# Patient Record
Sex: Male | Born: 1945
Health system: Southern US, Community
[De-identification: ages and names within clinical notes are randomized; demographics above are authoritative.]

## PROBLEM LIST (undated history)

## (undated) DIAGNOSIS — S72009A Fracture of unspecified part of neck of unspecified femur, initial encounter for closed fracture: Secondary | ICD-10-CM

## (undated) DIAGNOSIS — I700 Atherosclerosis of aorta: Secondary | ICD-10-CM

## (undated) DIAGNOSIS — M1990 Unspecified osteoarthritis, unspecified site: Secondary | ICD-10-CM

## (undated) DIAGNOSIS — H269 Unspecified cataract: Secondary | ICD-10-CM

## (undated) DIAGNOSIS — I10 Essential (primary) hypertension: Secondary | ICD-10-CM

## (undated) DIAGNOSIS — R112 Nausea with vomiting, unspecified: Secondary | ICD-10-CM

## (undated) DIAGNOSIS — R7303 Prediabetes: Secondary | ICD-10-CM

## (undated) DIAGNOSIS — Z9889 Other specified postprocedural states: Principal | ICD-10-CM

## (undated) DIAGNOSIS — E785 Hyperlipidemia, unspecified: Secondary | ICD-10-CM

## (undated) DIAGNOSIS — M8580 Other specified disorders of bone density and structure, unspecified site: Secondary | ICD-10-CM

## (undated) DIAGNOSIS — Z8781 Personal history of (healed) traumatic fracture: Secondary | ICD-10-CM

## (undated) HISTORY — DX: Unspecified cataract: H26.9

## (undated) HISTORY — DX: Hyperlipidemia, unspecified: E78.5

## (undated) HISTORY — DX: Other specified postprocedural states: Z98.890

## (undated) HISTORY — PX: HERNIA REPAIR: SHX51

## (undated) HISTORY — PX: CHOLECYSTECTOMY: SHX55

## (undated) HISTORY — DX: Essential (primary) hypertension: I10

## (undated) HISTORY — DX: Nausea with vomiting, unspecified: R11.2

## (undated) HISTORY — PX: SMALL INTESTINE SURGERY: SHX150

## (undated) HISTORY — DX: Fracture of unspecified part of neck of unspecified femur, initial encounter for closed fracture: S72.009A

## (undated) HISTORY — DX: Other specified disorders of bone density and structure, unspecified site: M85.80

## (undated) HISTORY — PX: COLON SURGERY: SHX602

---
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
## 1971-10-28 HISTORY — PX: CYST EXCISION: SHX5701

## 2005-10-27 DIAGNOSIS — S72009A Fracture of unspecified part of neck of unspecified femur, initial encounter for closed fracture: Secondary | ICD-10-CM

## 2005-10-27 HISTORY — PX: FRACTURE SURGERY: SHX138

## 2005-10-27 HISTORY — DX: Fracture of unspecified part of neck of unspecified femur, initial encounter for closed fracture: S72.009A

## 2006-03-10 ENCOUNTER — Inpatient Hospital Stay (HOSPITAL_COMMUNITY): Admission: EM | Admit: 2006-03-10 | Discharge: 2006-03-12 | Payer: Self-pay | Admitting: Emergency Medicine

## 2008-07-06 ENCOUNTER — Ambulatory Visit: Payer: Self-pay | Admitting: Gastroenterology

## 2011-10-09 ENCOUNTER — Ambulatory Visit: Payer: Self-pay | Admitting: Otolaryngology

## 2012-03-18 ENCOUNTER — Ambulatory Visit: Payer: Self-pay | Admitting: Family Medicine

## 2015-03-20 DIAGNOSIS — H18611 Keratoconus, stable, right eye: Secondary | ICD-10-CM | POA: Diagnosis not present

## 2015-03-20 DIAGNOSIS — H524 Presbyopia: Secondary | ICD-10-CM | POA: Diagnosis not present

## 2015-03-20 DIAGNOSIS — D3131 Benign neoplasm of right choroid: Secondary | ICD-10-CM | POA: Diagnosis not present

## 2015-04-24 ENCOUNTER — Telehealth: Payer: Self-pay | Admitting: Family Medicine

## 2015-04-24 NOTE — Telephone Encounter (Signed)
Ms Drew Mcmahon called in to schedule an appointment to refill meds that run out this Friday but the next available appointment is July 20th and they would like a call back letting her know if meds can be given to last until the appointment on the 20th.

## 2015-04-25 NOTE — Telephone Encounter (Signed)
Drew Mcmahon called in to schedule an appointment to refill meds that run out this Friday but the next available appointment is July 20th and they would like a call back letting her know if meds can be given to last until the appointment on the 20th.

## 2015-04-26 MED ORDER — LOSARTAN POTASSIUM-HCTZ 100-25 MG PO TABS: 1.0000 | ORAL_TABLET | Freq: Every day | ORAL | Status: DC

## 2015-04-26 NOTE — Telephone Encounter (Signed)
rx sent pt making apt PE

## 2015-04-26 NOTE — Telephone Encounter (Signed)
I SPOKE WITH MS GAYLE AND THE MEDICATION NEEDED IS LISARTIN(NOT SURE HOW TO SPELL IT) THEY WOULD LIKE IT TO GO TO [address] [place]

## 2015-05-01 ENCOUNTER — Other Ambulatory Visit: Payer: Self-pay | Admitting: Family Medicine

## 2015-05-01 MED ORDER — AMLODIPINE BESYLATE 10 MG PO TABS: 10.0000 mg | ORAL_TABLET | Freq: Every day | ORAL | Status: DC

## 2015-05-01 NOTE — Telephone Encounter (Signed)
Pt's wife called stated one if the medications the pt needs got sent to the pharmacy but the other did not. Pt needs Amlodopine to last until his appt on 05/16/15 with Dr. Jeananne Rama. 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 Pt is completely out of medication as of Saturday. Please call pt if this can or can not be done @ [phone] Edd Fabian). Thanks.

## 2015-05-01 NOTE — Telephone Encounter (Signed)
Ordered medication please review and sign.  [place]

## 2015-05-15 DIAGNOSIS — I10 Essential (primary) hypertension: Secondary | ICD-10-CM | POA: Insufficient documentation

## 2015-05-15 DIAGNOSIS — E785 Hyperlipidemia, unspecified: Secondary | ICD-10-CM | POA: Insufficient documentation

## 2015-05-16 ENCOUNTER — Other Ambulatory Visit: Payer: Self-pay | Admitting: Family Medicine

## 2015-05-16 ENCOUNTER — Ambulatory Visit: Payer: Medicare Other | Admitting: Family Medicine

## 2015-05-16 MED ORDER — AMLODIPINE BESYLATE 10 MG PO TABS: 10.0000 mg | ORAL_TABLET | Freq: Every day | ORAL | Status: DC

## 2015-06-28 ENCOUNTER — Ambulatory Visit: Payer: Medicare Other | Admitting: Family Medicine

## 2015-07-13 DIAGNOSIS — L280 Lichen simplex chronicus: Secondary | ICD-10-CM | POA: Diagnosis not present

## 2015-07-13 DIAGNOSIS — L239 Allergic contact dermatitis, unspecified cause: Secondary | ICD-10-CM | POA: Diagnosis not present

## 2015-07-13 DIAGNOSIS — L538 Other specified erythematous conditions: Secondary | ICD-10-CM | POA: Diagnosis not present

## 2015-07-13 DIAGNOSIS — D485 Neoplasm of uncertain behavior of skin: Secondary | ICD-10-CM | POA: Diagnosis not present

## 2015-07-13 DIAGNOSIS — D2111 Benign neoplasm of connective and other soft tissue of right upper limb, including shoulder: Secondary | ICD-10-CM | POA: Diagnosis not present

## 2015-08-27 ENCOUNTER — Encounter: Payer: Self-pay | Admitting: Family Medicine

## 2015-08-27 ENCOUNTER — Ambulatory Visit (INDEPENDENT_AMBULATORY_CARE_PROVIDER_SITE_OTHER): Payer: Medicare Other | Admitting: Family Medicine

## 2015-08-27 VITALS — BP 128/71 | HR 70 | Temp 98.1°F | Ht 72.0 in | Wt 227.0 lb

## 2015-08-27 DIAGNOSIS — G6289 Other specified polyneuropathies: Secondary | ICD-10-CM | POA: Diagnosis not present

## 2015-08-27 DIAGNOSIS — Z23 Encounter for immunization: Secondary | ICD-10-CM

## 2015-08-27 DIAGNOSIS — G629 Polyneuropathy, unspecified: Secondary | ICD-10-CM | POA: Insufficient documentation

## 2015-08-27 DIAGNOSIS — N400 Enlarged prostate without lower urinary tract symptoms: Secondary | ICD-10-CM | POA: Diagnosis not present

## 2015-08-27 DIAGNOSIS — Z0000 Encounter for general adult medical examination without abnormal findings: Secondary | ICD-10-CM | POA: Diagnosis not present

## 2015-08-27 DIAGNOSIS — I10 Essential (primary) hypertension: Secondary | ICD-10-CM

## 2015-08-27 LAB — URINALYSIS, ROUTINE W REFLEX MICROSCOPIC
BILIRUBIN UA: NEGATIVE
GLUCOSE, UA: NEGATIVE
KETONES UA: NEGATIVE
Nitrite, UA: NEGATIVE
PH UA: 6 (ref 5.0–7.5)
PROTEIN UA: NEGATIVE
RBC UA: NEGATIVE
SPEC GRAV UA: 1.02 (ref 1.005–1.030)
UUROB: 0.2 mg/dL (ref 0.2–1.0)

## 2015-08-27 LAB — MICROSCOPIC EXAMINATION: RBC, UA: NONE SEEN /hpf (ref 0–?)

## 2015-08-27 MED ORDER — LOSARTAN POTASSIUM-HCTZ 100-25 MG PO TABS: 1.0000 | ORAL_TABLET | Freq: Every day | ORAL | Status: DC

## 2015-08-27 MED ORDER — AMLODIPINE BESYLATE 10 MG PO TABS: 10.0000 mg | ORAL_TABLET | Freq: Every day | ORAL | Status: DC

## 2015-08-27 NOTE — Assessment & Plan Note (Signed)
The current medical regimen is effective;  continue present plan and medications.  

## 2015-08-27 NOTE — Assessment & Plan Note (Signed)
We'll begin workup with lab V57, folic acid and other labs. Lumbar spine x-ray

## 2015-08-27 NOTE — Progress Notes (Signed)
BP 128/71 mmHg  Pulse 70  Temp(Src) 98.1 F (36.7 C)  Ht 6' (1.829 m)  Wt 227 lb (102.967 kg)  BMI 30.78 kg/m2  SpO2 98%   Subjective:    Patient ID: Drew Mcmahon, male    DOB: Feb 22, 1946, 69 y.o.   MRN: [id number]  HPI: Drew Mcmahon is a 69 y.o. male  Chief Complaint  Patient presents with  . Annual Exam   patient also follow-up hypertension doing well with medications no side effects or complaints taking faithfully with good control.  Patient also with some nonspecific dry itching rash on legs has tried a little bit of triamcinolone Over the last year or so but getting more and more noticeable in his bilateral foot numbness especially with walking but also at rest and at bedtime. Which really just all 10 toes are numb. Maybe the right more so than the left.   Relevant past medical, surgical, family and social history reviewed and updated as indicated. Interim medical history since our last visit reviewed. Allergies and medications reviewed and updated.  Review of Systems  Constitutional: Negative.   HENT: Negative.   Eyes: Negative.   Respiratory: Negative.   Cardiovascular: Negative.   Gastrointestinal: Negative.        Some occasional dysphagia with bread or fish but nothing on a regular basis.  Endocrine: Negative.   Genitourinary: Negative.   Musculoskeletal: Negative.   Skin: Negative.   Allergic/Immunologic: Negative.   Neurological: Negative.   Hematological: Negative.   Psychiatric/Behavioral: Negative.     Per HPI unless specifically indicated above     Objective:    BP 128/71 mmHg  Pulse 70  Temp(Src) 98.1 F (36.7 C)  Ht 6' (1.829 m)  Wt 227 lb (102.967 kg)  BMI 30.78 kg/m2  SpO2 98%  Wt Readings from Last 3 Encounters:  08/27/15 227 lb (102.967 kg)  10/13/14 235 lb (106.595 kg)    Physical Exam  Constitutional: He is oriented to person, place, and time. He appears well-developed and well-nourished.  HENT:  Head: Normocephalic and  atraumatic.  Right Ear: External ear normal.  Left Ear: External ear normal.  Eyes: Conjunctivae and EOM are normal. Pupils are equal, round, and reactive to light.  Neck: Normal range of motion. Neck supple.  Cardiovascular: Normal rate, regular rhythm, normal heart sounds and intact distal pulses.   Pulmonary/Chest: Effort normal and breath sounds normal.  Abdominal: Soft. Bowel sounds are normal. There is no splenomegaly or hepatomegaly.  Genitourinary: Rectum normal and penis normal.  Prostate enlarged  Musculoskeletal: Normal range of motion.  Neurological: He is alert and oriented to person, place, and time. He has normal reflexes.  Normal sensation to monofilament line  Skin: No rash noted. No erythema.  Eczema changes both lower legs  Psychiatric: He has a normal mood and affect. His behavior is normal. Judgment and thought content normal.    No results found for this or any previous visit.    Assessment & Plan:   Problem List Items Addressed This Visit      Cardiovascular and Mediastinum   Hypertension    The current medical regimen is effective;  continue present plan and medications.       Relevant Medications   amLODipine (NORVASC) 10 MG tablet   losartan-hydrochlorothiazide (HYZAAR) 100-25 MG tablet   Other Relevant Orders   Comprehensive metabolic panel   Lipid panel   CBC with Differential/Platelet   TSH     Nervous and Auditory  Peripheral neuropathy (HCC)    We'll begin workup with lab V49, folic acid and other labs. Lumbar spine x-ray      Relevant Orders   Comprehensive metabolic panel   Lipid panel   CBC with Differential/Platelet   TSH   DG Lumbar Spine 2-3 Views     Genitourinary   BPH (benign prostatic hyperplasia)   Relevant Orders   Comprehensive metabolic panel   Lipid panel   CBC with Differential/Platelet   PSA   Urinalysis, Routine w reflex microscopic (not at [facility])   TSH    Other Visit Diagnoses    Immunization due    -   Primary    Relevant Orders    Flu Vaccine QUAD 36+ mos PF IM (Fluarix & Fluzone Quad PF) (Completed)    PE (physical exam), annual            Follow up plan: Return in about 6 months (around 02/24/2016), or if symptoms worsen or fail to improve, for pending feet, and BP check 6 mo with BMP.

## 2015-08-28 ENCOUNTER — Telehealth: Payer: Self-pay | Admitting: Family Medicine

## 2015-08-28 DIAGNOSIS — E7800 Pure hypercholesterolemia, unspecified: Secondary | ICD-10-CM

## 2015-08-28 DIAGNOSIS — R7309 Other abnormal glucose: Secondary | ICD-10-CM

## 2015-08-28 LAB — COMPREHENSIVE METABOLIC PANEL
A/G RATIO: 1.6 (ref 1.1–2.5)
ALBUMIN: 4.2 g/dL (ref 3.6–4.8)
ALK PHOS: 95 IU/L (ref 39–117)
ALT: 19 IU/L (ref 0–44)
AST: 17 IU/L (ref 0–40)
BILIRUBIN TOTAL: 0.7 mg/dL (ref 0.0–1.2)
BUN / CREAT RATIO: 18 (ref 10–22)
BUN: 17 mg/dL (ref 8–27)
CHLORIDE: 98 mmol/L (ref 97–106)
CO2: 25 mmol/L (ref 18–29)
Calcium: 9.4 mg/dL (ref 8.6–10.2)
Creatinine, Ser: 0.97 mg/dL (ref 0.76–1.27)
GFR calc non Af Amer: 80 mL/min/{1.73_m2} (ref >59)
GFR, EST AFRICAN AMERICAN: 92 mL/min/{1.73_m2} (ref >59)
Globulin, Total: 2.7 g/dL (ref 1.5–4.5)
Glucose: 127 mg/dL — ABNORMAL HIGH (ref 65–99)
POTASSIUM: 4.3 mmol/L (ref 3.5–5.2)
Sodium: 138 mmol/L (ref 136–144)
TOTAL PROTEIN: 6.9 g/dL (ref 6.0–8.5)

## 2015-08-28 LAB — CBC WITH DIFFERENTIAL/PLATELET
Basophils Absolute: 0 10*3/uL (ref 0.0–0.2)
Basos: 0 %
EOS (ABSOLUTE): 0.2 10*3/uL (ref 0.0–0.4)
EOS: 2 %
HEMATOCRIT: 45.9 % (ref 37.5–51.0)
HEMOGLOBIN: 16.2 g/dL (ref 12.6–17.7)
IMMATURE GRANS (ABS): 0 10*3/uL (ref 0.0–0.1)
IMMATURE GRANULOCYTES: 0 %
LYMPHS: 31 %
Lymphocytes Absolute: 2.5 10*3/uL (ref 0.7–3.1)
MCH: 31.6 pg (ref 26.6–33.0)
MCHC: 35.3 g/dL (ref 31.5–35.7)
MCV: 90 fL (ref 79–97)
MONOCYTES: 10 %
Monocytes Absolute: 0.8 10*3/uL (ref 0.1–0.9)
NEUTROS PCT: 57 %
Neutrophils Absolute: 4.7 10*3/uL (ref 1.4–7.0)
Platelets: 226 10*3/uL (ref 150–379)
RBC: 5.13 x10E6/uL (ref 4.14–5.80)
RDW: 12.6 % (ref 12.3–15.4)
WBC: 8.1 10*3/uL (ref 3.4–10.8)

## 2015-08-28 LAB — LIPID PANEL
CHOLESTEROL TOTAL: 216 mg/dL — AB (ref 100–199)
Chol/HDL Ratio: 3.7 ratio units (ref 0.0–5.0)
HDL: 58 mg/dL (ref >39)
LDL Calculated: 139 mg/dL — ABNORMAL HIGH (ref 0–99)
Triglycerides: 94 mg/dL (ref 0–149)
VLDL Cholesterol Cal: 19 mg/dL (ref 5–40)

## 2015-08-28 LAB — PSA: Prostate Specific Ag, Serum: 3.1 ng/mL (ref 0.0–4.0)

## 2015-08-28 LAB — TSH: TSH: 1.73 u[IU]/mL (ref 0.450–4.500)

## 2015-08-28 NOTE — Telephone Encounter (Signed)
-----   Message from Wynn Maudlin, [place] sent at 08/28/2015  4:34 PM EDT ----- labs

## 2015-08-28 NOTE — Telephone Encounter (Signed)
Phone call Discussed with patient elevated glucose and lipids patient will do better with diet exercise nutrition recheck lipid panel and BMP and hemoglobin A1c next office visit.

## 2015-09-10 ENCOUNTER — Ambulatory Visit (INDEPENDENT_AMBULATORY_CARE_PROVIDER_SITE_OTHER): Payer: Medicare Other | Admitting: Family Medicine

## 2015-09-10 ENCOUNTER — Encounter: Payer: Self-pay | Admitting: Family Medicine

## 2015-09-10 VITALS — BP 122/69 | HR 69 | Temp 97.8°F | Ht 71.6 in | Wt 232.0 lb

## 2015-09-10 DIAGNOSIS — G603 Idiopathic progressive neuropathy: Secondary | ICD-10-CM | POA: Diagnosis not present

## 2015-09-10 DIAGNOSIS — L259 Unspecified contact dermatitis, unspecified cause: Secondary | ICD-10-CM | POA: Diagnosis not present

## 2015-09-10 MED ORDER — PREDNISONE 10 MG PO TABS: ORAL_TABLET | ORAL | Status: DC

## 2015-09-10 NOTE — Assessment & Plan Note (Signed)
Patient had misunderstood about x-ray order is waiting for a phone call from [place] Patient will go back and get his x-ray

## 2015-09-10 NOTE — Progress Notes (Signed)
BP 122/69 mmHg  Pulse 69  Temp(Src) 97.8 F (36.6 C)  Ht 5' 11.6" (1.819 m)  Wt 232 lb (105.235 kg)  BMI 31.80 kg/m2   Subjective:    Patient ID: Drew Mcmahon, male    DOB: July 08, 1946, 69 y.o.   MRN: [id number]  HPI: ERNEST HYNEMAN is a 69 y.o. male  Chief Complaint  Patient presents with  . Rash   patient rash on both legs as mentioned last note is been using some triamcinolone cream sparingly with not much success now was broken out much more and on review problem really started in April and is been ongoing off and on all summer. Patient here with his wife and there is no change in detergents soaps anything for years no new contacts or irritants. Problems mostly on patient's lower legs but also in his thigh area and upper arms lateral surfaces itch from time to time. But no rash  Relevant past medical, surgical, family and social history reviewed and updated as indicated. Interim medical history since our last visit reviewed. Allergies and medications reviewed and updated.  Review of Systems  Constitutional: Negative.   Respiratory: Negative.   Cardiovascular: Negative.     Per HPI unless specifically indicated above     Objective:    BP 122/69 mmHg  Pulse 69  Temp(Src) 97.8 F (36.6 C)  Ht 5' 11.6" (1.819 m)  Wt 232 lb (105.235 kg)  BMI 31.80 kg/m2  Wt Readings from Last 3 Encounters:  09/10/15 232 lb (105.235 kg)  08/27/15 227 lb (102.967 kg)  10/13/14 235 lb (106.595 kg)    Physical Exam  Constitutional: He is oriented to person, place, and time. He appears well-developed and well-nourished. No distress.  HENT:  Head: Normocephalic and atraumatic.  Right Ear: Hearing normal.  Left Ear: Hearing normal.  Nose: Nose normal.  Eyes: Conjunctivae and lids are normal. Right eye exhibits no discharge. Left eye exhibits no discharge. No scleral icterus.  Pulmonary/Chest: Effort normal. No respiratory distress.  Musculoskeletal: Normal range of motion.   Neurological: He is alert and oriented to person, place, and time.  Skin: Skin is intact. No rash noted.  Patient with red blistering-like rash both legs in large patches especially right lateral leg  Psychiatric: He has a normal mood and affect. His speech is normal and behavior is normal. Judgment and thought content normal. Cognition and memory are normal.    Results for orders placed or performed in visit on 08/27/15  Microscopic Examination  Result Value Ref Range   WBC, UA 0-5 0 -  5 /hpf   RBC, UA None seen 0 -  2 /hpf   Epithelial Cells (non renal) 0-10 0 - 10 /hpf   Bacteria, UA Few None seen/Few  Comprehensive metabolic panel  Result Value Ref Range   Glucose 127 (H) 65 - 99 mg/dL   BUN 17 8 - 27 mg/dL   Creatinine, Ser 0.97 0.76 - 1.27 mg/dL   GFR calc non Af Amer 80 >59 mL/min/1.73   GFR calc Af Amer 92 >59 mL/min/1.73   BUN/Creatinine Ratio 18 10 - 22   Sodium 138 136 - 144 mmol/L   Potassium 4.3 3.5 - 5.2 mmol/L   Chloride 98 97 - 106 mmol/L   CO2 25 18 - 29 mmol/L   Calcium 9.4 8.6 - 10.2 mg/dL   Total Protein 6.9 6.0 - 8.5 g/dL   Albumin 4.2 3.6 - 4.8 g/dL   Globulin, Total 2.7  1.5 - 4.5 g/dL   Albumin/Globulin Ratio 1.6 1.1 - 2.5   Bilirubin Total 0.7 0.0 - 1.2 mg/dL   Alkaline Phosphatase 95 39 - 117 IU/L   AST 17 0 - 40 IU/L   ALT 19 0 - 44 IU/L  Lipid panel  Result Value Ref Range   Cholesterol, Total 216 (H) 100 - 199 mg/dL   Triglycerides 94 0 - 149 mg/dL   HDL 58 >39 mg/dL   VLDL Cholesterol Cal 19 5 - 40 mg/dL   LDL Calculated 139 (H) 0 - 99 mg/dL   Chol/HDL Ratio 3.7 0.0 - 5.0 ratio units  CBC with Differential/Platelet  Result Value Ref Range   WBC 8.1 3.4 - 10.8 x10E3/uL   RBC 5.13 4.14 - 5.80 x10E6/uL   Hemoglobin 16.2 12.6 - 17.7 g/dL   Hematocrit 45.9 37.5 - 51.0 %   MCV 90 79 - 97 fL   MCH 31.6 26.6 - 33.0 pg   MCHC 35.3 31.5 - 35.7 g/dL   RDW 12.6 12.3 - 15.4 %   Platelets 226 150 - 379 x10E3/uL   Neutrophils 57 %   Lymphs 31 %    Monocytes 10 %   Eos 2 %   Basos 0 %   Neutrophils Absolute 4.7 1.4 - 7.0 x10E3/uL   Lymphocytes Absolute 2.5 0.7 - 3.1 x10E3/uL   Monocytes Absolute 0.8 0.1 - 0.9 x10E3/uL   EOS (ABSOLUTE) 0.2 0.0 - 0.4 x10E3/uL   Basophils Absolute 0.0 0.0 - 0.2 x10E3/uL   Immature Granulocytes 0 %   Immature Grans (Abs) 0.0 0.0 - 0.1 x10E3/uL  PSA  Result Value Ref Range   Prostate Specific Ag, Serum 3.1 0.0 - 4.0 ng/mL  Urinalysis, Routine w reflex microscopic (not at [facility])  Result Value Ref Range   Specific Gravity, UA 1.020 1.005 - 1.030   pH, UA 6.0 5.0 - 7.5   Color, UA Yellow Yellow   Appearance Ur Clear Clear   Leukocytes, UA Trace (A) Negative   Protein, UA Negative Negative/Trace   Glucose, UA Negative Negative   Ketones, UA Negative Negative   RBC, UA Negative Negative   Bilirubin, UA Negative Negative   Urobilinogen, Ur 0.2 0.2 - 1.0 mg/dL   Nitrite, UA Negative Negative   Microscopic Examination See below:   TSH  Result Value Ref Range   TSH 1.730 0.450 - 4.500 uIU/mL      Assessment & Plan:   Problem List Items Addressed This Visit      Nervous and Auditory   Peripheral neuropathy (HCC)    Patient had misunderstood about x-ray order is waiting for a phone call from [place] Patient will go back and get his x-ray        Musculoskeletal and Integument   Contact dermatitis - Primary    Discuss Will change fabric softener not use any dryer sheets We'll use prednisone 10 mg 5 a day for 5 days Discuss if problem reoccurs will refer to dermatology          Follow up plan: Return for As scheduled.

## 2015-09-10 NOTE — Assessment & Plan Note (Signed)
Discuss Will change fabric softener not use any dryer sheets We'll use prednisone 10 mg 5 a day for 5 days Discuss if problem reoccurs will refer to dermatology

## 2015-09-17 ENCOUNTER — Ambulatory Visit
Admission: RE | Admit: 2015-09-17 | Discharge: 2015-09-17 | Disposition: A | Discharge status: Home / Self Care | Payer: Medicare Other | Source: Ambulatory Visit | Attending: Family Medicine | Admitting: Family Medicine

## 2015-09-17 ENCOUNTER — Telehealth: Payer: Self-pay | Admitting: Family Medicine

## 2015-09-17 DIAGNOSIS — M47816 Spondylosis without myelopathy or radiculopathy, lumbar region: Secondary | ICD-10-CM | POA: Diagnosis not present

## 2015-09-17 DIAGNOSIS — G6289 Other specified polyneuropathies: Secondary | ICD-10-CM | POA: Insufficient documentation

## 2015-09-17 DIAGNOSIS — M5136 Other intervertebral disc degeneration, lumbar region: Secondary | ICD-10-CM

## 2015-09-17 NOTE — Telephone Encounter (Signed)
Phone call Discussed with patient marked degenerative arthritis of lumbar spine on x-ray With x-ray changes and bilateral lower leg numbness will get MRI of lumbar spine

## 2015-09-17 NOTE — Telephone Encounter (Signed)
-----   Message from Wynn Maudlin, [place] sent at 09/17/2015 12:06 PM EST ----- labs

## 2015-09-26 ENCOUNTER — Telehealth: Payer: Self-pay | Admitting: Family Medicine

## 2015-09-26 ENCOUNTER — Telehealth: Payer: Self-pay

## 2015-09-26 DIAGNOSIS — L299 Pruritus, unspecified: Secondary | ICD-10-CM

## 2015-09-26 NOTE — Telephone Encounter (Signed)
Phone call Discussed with patient continuing to have intense leg itching has tried creams lotion no response to prednisone will refer to dermatology

## 2015-09-26 NOTE — Telephone Encounter (Signed)
Pt's wife called stated they need a call back from [place] regarding issues pt is having. Thanks.

## 2015-09-26 NOTE — Telephone Encounter (Signed)
Patient wondering about his MRI appointment.  Please call

## 2015-09-26 NOTE — Telephone Encounter (Signed)
Actually has not been scheduled as of yet by [facility] scheduling, they will contact him to schedule since it was marked routine.

## 2015-10-08 DIAGNOSIS — I8311 Varicose veins of right lower extremity with inflammation: Secondary | ICD-10-CM | POA: Diagnosis not present

## 2015-10-08 DIAGNOSIS — L308 Other specified dermatitis: Secondary | ICD-10-CM | POA: Diagnosis not present

## 2015-10-25 ENCOUNTER — Ambulatory Visit
Admission: RE | Admit: 2015-10-25 | Discharge: 2015-10-25 | Disposition: A | Discharge status: Home / Self Care | Payer: Medicare Other | Source: Ambulatory Visit | Attending: Family Medicine | Admitting: Family Medicine

## 2015-10-25 DIAGNOSIS — M4806 Spinal stenosis, lumbar region: Secondary | ICD-10-CM | POA: Diagnosis not present

## 2015-10-25 DIAGNOSIS — M5136 Other intervertebral disc degeneration, lumbar region: Secondary | ICD-10-CM | POA: Diagnosis not present

## 2015-10-30 ENCOUNTER — Telehealth: Payer: Self-pay | Admitting: Family Medicine

## 2015-10-30 NOTE — Telephone Encounter (Signed)
Phone call Discussed with patient L4-5 disc on the right will refer to physical therapy to further evaluate and treat

## 2015-10-30 NOTE — Telephone Encounter (Signed)
-----   Message from Wynn Maudlin, [place] sent at 10/30/2015  5:08 PM EST ----- mri

## 2015-11-15 DIAGNOSIS — M5417 Radiculopathy, lumbosacral region: Secondary | ICD-10-CM | POA: Diagnosis not present

## 2015-12-11 ENCOUNTER — Encounter: Payer: Self-pay | Admitting: Family Medicine

## 2016-02-25 ENCOUNTER — Ambulatory Visit: Payer: Medicare Other | Admitting: Family Medicine

## 2016-03-04 ENCOUNTER — Ambulatory Visit (INDEPENDENT_AMBULATORY_CARE_PROVIDER_SITE_OTHER): Payer: Medicare Other | Admitting: Family Medicine

## 2016-03-04 ENCOUNTER — Encounter: Payer: Self-pay | Admitting: Family Medicine

## 2016-03-04 VITALS — BP 121/67 | HR 61 | Temp 98.0°F | Ht 71.3 in | Wt 230.0 lb

## 2016-03-04 DIAGNOSIS — E7800 Pure hypercholesterolemia, unspecified: Secondary | ICD-10-CM | POA: Diagnosis not present

## 2016-03-04 DIAGNOSIS — Z0000 Encounter for general adult medical examination without abnormal findings: Secondary | ICD-10-CM | POA: Diagnosis not present

## 2016-03-04 DIAGNOSIS — R7309 Other abnormal glucose: Secondary | ICD-10-CM | POA: Diagnosis not present

## 2016-03-04 DIAGNOSIS — Z1211 Encounter for screening for malignant neoplasm of colon: Secondary | ICD-10-CM | POA: Diagnosis not present

## 2016-03-04 DIAGNOSIS — I10 Essential (primary) hypertension: Secondary | ICD-10-CM

## 2016-03-04 DIAGNOSIS — E785 Hyperlipidemia, unspecified: Secondary | ICD-10-CM | POA: Diagnosis not present

## 2016-03-04 LAB — LP+ALT+AST PICCOLO, WAIVED
ALT (SGPT) Piccolo, Waived: 30 U/L (ref 10–47)
AST (SGOT) Piccolo, Waived: 26 U/L (ref 11–38)
CHOL/HDL RATIO PICCOLO,WAIVE: 3.1 mg/dL
CHOLESTEROL PICCOLO, WAIVED: 205 mg/dL — AB (ref <200)
HDL Chol Piccolo, Waived: 67 mg/dL (ref >59)
LDL CHOL CALC PICCOLO WAIVED: 120 mg/dL — AB (ref <100)
TRIGLYCERIDES PICCOLO,WAIVED: 89 mg/dL (ref <150)
VLDL Chol Calc Piccolo,Waive: 18 mg/dL (ref <30)

## 2016-03-04 LAB — BAYER DCA HB A1C WAIVED: HB A1C (BAYER DCA - WAIVED): 5.6 % (ref <7.0)

## 2016-03-04 NOTE — Assessment & Plan Note (Signed)
The current medical regimen is effective;  continue present plan and medications.  

## 2016-03-04 NOTE — Progress Notes (Signed)
BP 121/67 mmHg  Pulse 61  Temp(Src) 98 F (36.7 C)  Ht 5' 11.3" (1.811 m)  Wt 230 lb (104.327 kg)  BMI 31.81 kg/m2  SpO2 98%   Subjective:    Patient ID: Drew Mcmahon, male    DOB: 1946/10/15, 70 y.o.   MRN: [id number]  HPI: Drew Mcmahon is a 70 y.o. male  Chief Complaint  Patient presents with  . Hyperlipidemia  . Hypertension  . Hyperglycemia  Patient doing well with occasional checks blood sugar at home which is been okay no changes Taking cholesterol medicines faithfully without side effects Good control Taking blood pressure medicines without problems good control no side effects   Relevant past medical, surgical, family and social history reviewed and updated as indicated. Interim medical history since our last visit reviewed. Allergies and medications reviewed and updated.  Review of Systems  Constitutional: Negative.   Respiratory: Negative.   Cardiovascular: Negative.     Per HPI unless specifically indicated above     Objective:    BP 121/67 mmHg  Pulse 61  Temp(Src) 98 F (36.7 C)  Ht 5' 11.3" (1.811 m)  Wt 230 lb (104.327 kg)  BMI 31.81 kg/m2  SpO2 98%  Wt Readings from Last 3 Encounters:  03/04/16 230 lb (104.327 kg)  09/10/15 232 lb (105.235 kg)  08/27/15 227 lb (102.967 kg)    Physical Exam  Constitutional: He is oriented to person, place, and time. He appears well-developed and well-nourished. No distress.  HENT:  Head: Normocephalic and atraumatic.  Right Ear: Hearing normal.  Left Ear: Hearing normal.  Nose: Nose normal.  Eyes: Conjunctivae and lids are normal. Right eye exhibits no discharge. Left eye exhibits no discharge. No scleral icterus.  Cardiovascular: Normal rate, regular rhythm and normal heart sounds.   Pulmonary/Chest: Effort normal and breath sounds normal. No respiratory distress.  Musculoskeletal: Normal range of motion.  Neurological: He is alert and oriented to person, place, and time.  Skin: Skin is intact. No  rash noted.  Psychiatric: He has a normal mood and affect. His speech is normal and behavior is normal. Judgment and thought content normal. Cognition and memory are normal.    Results for orders placed or performed in visit on 08/27/15  Microscopic Examination  Result Value Ref Range   WBC, UA 0-5 0 -  5 /hpf   RBC, UA None seen 0 -  2 /hpf   Epithelial Cells (non renal) 0-10 0 - 10 /hpf   Bacteria, UA Few None seen/Few  Comprehensive metabolic panel  Result Value Ref Range   Glucose 127 (H) 65 - 99 mg/dL   BUN 17 8 - 27 mg/dL   Creatinine, Ser 0.97 0.76 - 1.27 mg/dL   GFR calc non Af Amer 80 >59 mL/min/1.73   GFR calc Af Amer 92 >59 mL/min/1.73   BUN/Creatinine Ratio 18 10 - 22   Sodium 138 136 - 144 mmol/L   Potassium 4.3 3.5 - 5.2 mmol/L   Chloride 98 97 - 106 mmol/L   CO2 25 18 - 29 mmol/L   Calcium 9.4 8.6 - 10.2 mg/dL   Total Protein 6.9 6.0 - 8.5 g/dL   Albumin 4.2 3.6 - 4.8 g/dL   Globulin, Total 2.7 1.5 - 4.5 g/dL   Albumin/Globulin Ratio 1.6 1.1 - 2.5   Bilirubin Total 0.7 0.0 - 1.2 mg/dL   Alkaline Phosphatase 95 39 - 117 IU/L   AST 17 0 - 40 IU/L   ALT  19 0 - 44 IU/L  Lipid panel  Result Value Ref Range   Cholesterol, Total 216 (H) 100 - 199 mg/dL   Triglycerides 94 0 - 149 mg/dL   HDL 58 >39 mg/dL   VLDL Cholesterol Cal 19 5 - 40 mg/dL   LDL Calculated 139 (H) 0 - 99 mg/dL   Chol/HDL Ratio 3.7 0.0 - 5.0 ratio units  CBC with Differential/Platelet  Result Value Ref Range   WBC 8.1 3.4 - 10.8 x10E3/uL   RBC 5.13 4.14 - 5.80 x10E6/uL   Hemoglobin 16.2 12.6 - 17.7 g/dL   Hematocrit 45.9 37.5 - 51.0 %   MCV 90 79 - 97 fL   MCH 31.6 26.6 - 33.0 pg   MCHC 35.3 31.5 - 35.7 g/dL   RDW 12.6 12.3 - 15.4 %   Platelets 226 150 - 379 x10E3/uL   Neutrophils 57 %   Lymphs 31 %   Monocytes 10 %   Eos 2 %   Basos 0 %   Neutrophils Absolute 4.7 1.4 - 7.0 x10E3/uL   Lymphocytes Absolute 2.5 0.7 - 3.1 x10E3/uL   Monocytes Absolute 0.8 0.1 - 0.9 x10E3/uL   EOS  (ABSOLUTE) 0.2 0.0 - 0.4 x10E3/uL   Basophils Absolute 0.0 0.0 - 0.2 x10E3/uL   Immature Granulocytes 0 %   Immature Grans (Abs) 0.0 0.0 - 0.1 x10E3/uL  PSA  Result Value Ref Range   Prostate Specific Ag, Serum 3.1 0.0 - 4.0 ng/mL  Urinalysis, Routine w reflex microscopic (not at [facility])  Result Value Ref Range   Specific Gravity, UA 1.020 1.005 - 1.030   pH, UA 6.0 5.0 - 7.5   Color, UA Yellow Yellow   Appearance Ur Clear Clear   Leukocytes, UA Trace (A) Negative   Protein, UA Negative Negative/Trace   Glucose, UA Negative Negative   Ketones, UA Negative Negative   RBC, UA Negative Negative   Bilirubin, UA Negative Negative   Urobilinogen, Ur 0.2 0.2 - 1.0 mg/dL   Nitrite, UA Negative Negative   Microscopic Examination See below:   TSH  Result Value Ref Range   TSH 1.730 0.450 - 4.500 uIU/mL      Assessment & Plan:   Problem List Items Addressed This Visit      Cardiovascular and Mediastinum   Hypertension    The current medical regimen is effective;  continue present plan and medications.         Other   Hyperlipidemia    The current medical regimen is effective;  continue present plan and medications.        Other Visit Diagnoses    Healthcare maintenance    -  Primary    Relevant Orders    Hepatitis C Antibody    Elevated glucose        Hypercholesterolemia        Encounter for screening colonoscopy        Relevant Orders    Ambulatory referral to Gastroenterology       With elevated glucose hemoglobin A1c comes back normal discussed diet nutrition exercise weight loss. Follow up plan: Return in about 6 months (around 09/04/2016) for Physical Exam.

## 2016-03-05 ENCOUNTER — Encounter: Payer: Self-pay | Admitting: Family Medicine

## 2016-03-05 LAB — BASIC METABOLIC PANEL
BUN/Creatinine Ratio: 12 (ref 10–24)
BUN: 12 mg/dL (ref 8–27)
CALCIUM: 9.5 mg/dL (ref 8.6–10.2)
CHLORIDE: 99 mmol/L (ref 96–106)
CO2: 25 mmol/L (ref 18–29)
Creatinine, Ser: 0.97 mg/dL (ref 0.76–1.27)
GFR calc non Af Amer: 79 mL/min/{1.73_m2} (ref >59)
GFR, EST AFRICAN AMERICAN: 92 mL/min/{1.73_m2} (ref >59)
GLUCOSE: 88 mg/dL (ref 65–99)
POTASSIUM: 4.2 mmol/L (ref 3.5–5.2)
Sodium: 140 mmol/L (ref 134–144)

## 2016-03-05 LAB — HEPATITIS C ANTIBODY

## 2016-06-11 ENCOUNTER — Encounter: Payer: Self-pay | Admitting: *Deleted

## 2016-06-12 ENCOUNTER — Ambulatory Visit: Payer: Medicare Other | Admitting: Anesthesiology

## 2016-06-12 ENCOUNTER — Ambulatory Visit
Admission: RE | Admit: 2016-06-12 | Discharge: 2016-06-12 | Disposition: A | Discharge status: Home / Self Care | Payer: Medicare Other | Source: Ambulatory Visit | Attending: Unknown Physician Specialty | Admitting: Unknown Physician Specialty

## 2016-06-12 ENCOUNTER — Encounter
Admission: RE | Disposition: A | Discharge status: Home / Self Care | Payer: Self-pay | Source: Ambulatory Visit | Attending: Unknown Physician Specialty

## 2016-06-12 DIAGNOSIS — N400 Enlarged prostate without lower urinary tract symptoms: Secondary | ICD-10-CM | POA: Insufficient documentation

## 2016-06-12 DIAGNOSIS — K648 Other hemorrhoids: Secondary | ICD-10-CM | POA: Diagnosis not present

## 2016-06-12 DIAGNOSIS — K640 First degree hemorrhoids: Secondary | ICD-10-CM | POA: Insufficient documentation

## 2016-06-12 DIAGNOSIS — Z820 Family history of epilepsy and other diseases of the nervous system: Secondary | ICD-10-CM | POA: Diagnosis not present

## 2016-06-12 DIAGNOSIS — Z87891 Personal history of nicotine dependence: Secondary | ICD-10-CM | POA: Diagnosis not present

## 2016-06-12 DIAGNOSIS — Z8249 Family history of ischemic heart disease and other diseases of the circulatory system: Secondary | ICD-10-CM | POA: Diagnosis not present

## 2016-06-12 DIAGNOSIS — Z79899 Other long term (current) drug therapy: Secondary | ICD-10-CM | POA: Insufficient documentation

## 2016-06-12 DIAGNOSIS — Z6835 Body mass index (BMI) 35.0-35.9, adult: Secondary | ICD-10-CM | POA: Insufficient documentation

## 2016-06-12 DIAGNOSIS — I10 Essential (primary) hypertension: Secondary | ICD-10-CM | POA: Insufficient documentation

## 2016-06-12 DIAGNOSIS — Z7982 Long term (current) use of aspirin: Secondary | ICD-10-CM | POA: Insufficient documentation

## 2016-06-12 DIAGNOSIS — Z1211 Encounter for screening for malignant neoplasm of colon: Secondary | ICD-10-CM | POA: Diagnosis not present

## 2016-06-12 DIAGNOSIS — E785 Hyperlipidemia, unspecified: Secondary | ICD-10-CM | POA: Insufficient documentation

## 2016-06-12 DIAGNOSIS — Z9889 Other specified postprocedural states: Secondary | ICD-10-CM | POA: Diagnosis not present

## 2016-06-12 DIAGNOSIS — Z801 Family history of malignant neoplasm of trachea, bronchus and lung: Secondary | ICD-10-CM | POA: Insufficient documentation

## 2016-06-12 DIAGNOSIS — E119 Type 2 diabetes mellitus without complications: Secondary | ICD-10-CM | POA: Insufficient documentation

## 2016-06-12 DIAGNOSIS — E669 Obesity, unspecified: Secondary | ICD-10-CM | POA: Insufficient documentation

## 2016-06-12 DIAGNOSIS — Z86010 Personal history of colonic polyps: Secondary | ICD-10-CM | POA: Diagnosis not present

## 2016-06-12 HISTORY — PX: COLONOSCOPY WITH PROPOFOL: SHX5780

## 2016-06-12 HISTORY — DX: Hyperlipidemia, unspecified: E78.5

## 2016-06-12 SURGERY — COLONOSCOPY WITH PROPOFOL
Anesthesia: General

## 2016-06-12 MED ORDER — SODIUM CHLORIDE 0.9 % IV SOLN: INTRAVENOUS | Status: DC

## 2016-06-12 MED ORDER — PHENYLEPHRINE HCL 10 MG/ML IJ SOLN
INTRAMUSCULAR | Status: DC | PRN
  Administered 2016-06-12 (×2): 50 ug via INTRAVENOUS
  Administered 2016-06-12: 100 ug via INTRAVENOUS
  Administered 2016-06-12: 50 ug via INTRAVENOUS
  Administered 2016-06-12: 100 ug via INTRAVENOUS
  Administered 2016-06-12 (×2): 50 ug via INTRAVENOUS

## 2016-06-12 MED ORDER — PROPOFOL 500 MG/50ML IV EMUL
INTRAVENOUS | Status: DC | PRN
  Administered 2016-06-12: 140 ug/kg/min via INTRAVENOUS

## 2016-06-12 MED ORDER — PROPOFOL 10 MG/ML IV BOLUS
INTRAVENOUS | Status: DC | PRN
  Administered 2016-06-12: 70 mg via INTRAVENOUS

## 2016-06-12 MED ORDER — SODIUM CHLORIDE 0.9 % IV SOLN
INTRAVENOUS | Status: DC
  Administered 2016-06-12: 14:00:00 via INTRAVENOUS

## 2016-06-12 MED ORDER — LIDOCAINE HCL (CARDIAC) 20 MG/ML IV SOLN
INTRAVENOUS | Status: DC | PRN
  Administered 2016-06-12: 50 mg via INTRAVENOUS

## 2016-06-12 NOTE — Anesthesia Procedure Notes (Signed)
Performed by: Caiya Bettes Pre-anesthesia Checklist: Patient identified, Emergency Drugs available, Suction available, Patient being monitored and Timeout performed Patient Re-evaluated:Patient Re-evaluated prior to inductionOxygen Delivery Method: Nasal cannula     

## 2016-06-12 NOTE — Anesthesia Postprocedure Evaluation (Signed)
Anesthesia Post Note  Patient: Drew Mcmahon  Procedure(s) Performed: Procedure(s) (LRB): COLONOSCOPY WITH PROPOFOL (N/A)  Patient location during evaluation: PACU Anesthesia Type: General Level of consciousness: awake and alert and oriented Pain management: pain level controlled Vital Signs Assessment: post-procedure vital signs reviewed and stable Respiratory status: spontaneous breathing, nonlabored ventilation and respiratory function stable Cardiovascular status: blood pressure returned to baseline and stable Postop Assessment: no signs of nausea or vomiting Anesthetic complications: no    Last Vitals:  Vitals:   06/12/16 1436 06/12/16 1437  BP: 102/61 (!) 108/59  Pulse: (!) 58 62  Resp: 18 18  Temp:      Last Pain:  Vitals:   06/12/16 1433  TempSrc: Tympanic                 [name]

## 2016-06-12 NOTE — H&P (Signed)
   Primary Care Physician:  Golden Pop, MD Primary Gastroenterologist:  Dr. Vira Agar  Pre-Procedure History & Physical: HPI:  Drew Mcmahon is a 69 y.o. male is here for an colonoscopy.   Past Medical History:  Diagnosis Date  . Diabetes mellitus without complication ([place])   . Elevated lipids   . Hip fracture ([place]) 2007  . Hyperlipidemia   . Hypertension     Past Surgical History:  Procedure Laterality Date  . FRACTURE SURGERY Left 2007   hip    Prior to Admission medications   Medication Sig Start Date End Date Taking? Authorizing Provider  amLODipine (NORVASC) 10 MG tablet Take 1 tablet (10 mg total) by mouth daily. 08/27/15  Yes Guadalupe Maple, MD  aspirin (ASPIRIN EC) 81 MG EC tablet Take 81 mg by mouth daily. Swallow whole.   Yes Historical Provider, MD  losartan-hydrochlorothiazide (HYZAAR) 100-25 MG tablet Take 1 tablet by mouth daily. 08/27/15  Yes Guadalupe Maple, MD  Omega-3 Fatty Acids (FISH OIL PO) Take by mouth daily.   Yes Historical Provider, MD  augmented betamethasone dipropionate (DIPROLENE-AF) 0.05 % ointment  11/30/15   Historical Provider, MD  triamcinolone cream (KENALOG) 0.1 %  07/13/15   Historical Provider, MD    Allergies as of 05/08/2016  . (No Known Allergies)    Family History  Problem Relation Age of Onset  . Hypertension Mother   . Alzheimer's disease Mother   . Hypertension Father   . Cancer Father     lung  . Hypertension Sister   . Hypertension Brother   . Hypertension Sister   . Hypertension Sister   . Hypertension Brother     Social History   Social History  . Marital status: Married    Spouse name: N/A  . Number of children: N/A  . Years of education: N/A   Occupational History  . Not on file.   Social History Main Topics  . Smoking status: Former Smoker    Types: Cigarettes    Quit date: 08/26/1985  . Smokeless tobacco: Never Used  . Alcohol use No  . Drug use: No  . Sexual activity: Not on file   Other  Topics Concern  . Not on file   Social History Narrative  . No narrative on file    Review of Systems: See HPI, otherwise negative ROS  Physical Exam: BP 131/71   Pulse 97   Temp 97.3 F (36.3 C) (Oral)   Resp 20   Ht 6\' 2"  (1.88 m)   Wt 108 kg (238 lb)   SpO2 98%   BMI 30.56 kg/m  General:   Alert,  pleasant and cooperative in NAD Head:  Normocephalic and atraumatic. Neck:  Supple; no masses or thyromegaly. Lungs:  Clear throughout to auscultation.    Heart:  Regular rate and rhythm. Abdomen:  Soft, nontender and nondistended. Normal bowel sounds, without guarding, and without rebound.   Neurologic:  Alert and  oriented x4;  grossly normal neurologically.  Impression/Plan: Drew Mcmahon is here for an colonoscopy to be performed for [facility] colon polyps  Risks, benefits, limitations, and alternatives regarding  colonoscopy have been reviewed with the patient.  Questions have been answered.  All parties agreeable.   Gaylyn Cheers, MD  06/12/2016, 2:02 PM

## 2016-06-12 NOTE — Op Note (Signed)
[facility] Gastroenterology Patient Name: Drew Mcmahon Procedure Date: 06/12/2016 1:47 PM MRN: [id number] Account #: [id number] Date of Birth: 1946-08-28 Admit Type: Outpatient Age: 70 Room: [facility] ENDO ROOM 1 Gender: Male Note Status: Finalized Procedure:            Colonoscopy Indications:          High risk colon cancer surveillance: Personal history                        of colonic polyps Providers:            Manya Silvas, MD Referring MD:         Guadalupe Maple, MD (Referring MD) Medicines:            Propofol per Anesthesia Complications:        No immediate complications. Procedure:            Pre-Anesthesia Assessment:                       - After reviewing the risks and benefits, the patient                        was deemed in satisfactory condition to undergo the                        procedure.                       After obtaining informed consent, the colonoscope was                        passed under direct vision. Throughout the procedure,                        the patient's blood pressure, pulse, and oxygen                        saturations were monitored continuously. The                        Colonoscope was introduced through the anus and                        advanced to the the cecum, identified by appendiceal                        orifice and ileocecal valve. The colonoscopy was                        performed without difficulty. The patient tolerated the                        procedure well. The quality of the bowel preparation                        was good. Findings:      Rectal exam showed an enlarged prostate.      Internal hemorrhoids were found during endoscopy. The hemorrhoids were       small and Grade I (internal hemorrhoids that do not prolapse).      The exam was otherwise without abnormality. Impression:           -  Internal hemorrhoids.                       - The examination was otherwise normal.               - No specimens collected. Recommendation:       - Repeat colonoscopy in 5 years for surveillance.                       - Resume previous diet indefinitely. Manya Silvas, MD 06/12/2016 2:29:24 PM This report has been signed electronically. Number of Addenda: 0 Note Initiated On: 06/12/2016 1:47 PM Scope Withdrawal Time: 0 hours 10 minutes 52 seconds  Total Procedure Duration: 0 hours 19 minutes 32 seconds       [facility]

## 2016-06-12 NOTE — Anesthesia Preprocedure Evaluation (Signed)
Anesthesia Evaluation  Patient identified by MRN, date of birth, ID band Patient awake    Reviewed: Allergy & Precautions, NPO status , Patient's Chart, lab work & pertinent test results  History of Anesthesia Complications Negative for: history of anesthetic complications  Airway Mallampati: II  TM Distance: >3 FB Neck ROM: Full    Dental no notable dental hx.    Pulmonary neg COPD, former smoker,    breath sounds clear to auscultation- rhonchi (-) wheezing      Cardiovascular Exercise Tolerance: Good hypertension, Pt. on medications (-) CAD and (-) Past MI  Rhythm:Regular Rate:Normal - Systolic murmurs and - Diastolic murmurs    Neuro/Psych negative psych ROS   GI/Hepatic negative GI ROS, Neg liver ROS,   Endo/Other  diabetes  Renal/GU negative Renal ROS     Musculoskeletal   Abdominal (+) + obese,   Peds  Hematology negative hematology ROS (+)   Anesthesia Other Findings   Reproductive/Obstetrics                             Anesthesia Physical Anesthesia Plan  ASA: II  Anesthesia Plan: General   Post-op Pain Management:    Induction: Intravenous  Airway Management Planned: Natural Airway  Additional Equipment:   Intra-op Plan:   Post-operative Plan:   Informed Consent: I have reviewed the patients History and Physical, chart, labs and discussed the procedure including the risks, benefits and alternatives for the proposed anesthesia with the patient or authorized representative who has indicated his/her understanding and acceptance.   Dental advisory given  Plan Discussed with: CRNA and Anesthesiologist  Anesthesia Plan Comments:         Anesthesia Quick Evaluation

## 2016-06-12 NOTE — Transfer of Care (Signed)
Immediate Anesthesia Transfer of Care Note  Patient: Drew Mcmahon  Procedure(s) Performed: Procedure(s): COLONOSCOPY WITH PROPOFOL (N/A)  Patient Location: PACU  Anesthesia Type:General  Level of Consciousness: awake, alert  and oriented  Airway & Oxygen Therapy: Patient Spontanous Breathing and Patient connected to nasal cannula oxygen  Post-op Assessment: Report given to RN and Post -op Vital signs reviewed and stable  Post vital signs: Reviewed and stable  Last Vitals:  Vitals:   06/12/16 1310  BP: 131/71  Pulse: 97  Resp: 20  Temp: 36.3 C    Last Pain:  Vitals:   06/12/16 1310  TempSrc: Oral         Complications: No apparent anesthesia complications

## 2016-06-13 ENCOUNTER — Encounter: Payer: Self-pay | Admitting: Unknown Physician Specialty

## 2016-07-01 DIAGNOSIS — I8311 Varicose veins of right lower extremity with inflammation: Secondary | ICD-10-CM | POA: Diagnosis not present

## 2016-07-01 DIAGNOSIS — L280 Lichen simplex chronicus: Secondary | ICD-10-CM | POA: Diagnosis not present

## 2016-07-01 DIAGNOSIS — I8312 Varicose veins of left lower extremity with inflammation: Secondary | ICD-10-CM | POA: Diagnosis not present

## 2016-07-01 DIAGNOSIS — D692 Other nonthrombocytopenic purpura: Secondary | ICD-10-CM | POA: Diagnosis not present

## 2016-07-02 IMAGING — CR DG LUMBAR SPINE 2-3V
1 series · 4 of 4 positions shown · non-contrast
Comparison: None.

CLINICAL DATA: Bilateral numbness in both feet and toes.

EXAM:
LUMBAR SPINE - 2-3 VIEW

[Series 1: lat · 0.17mm/px · 4 of 4 slices shown]
[im 1/4]
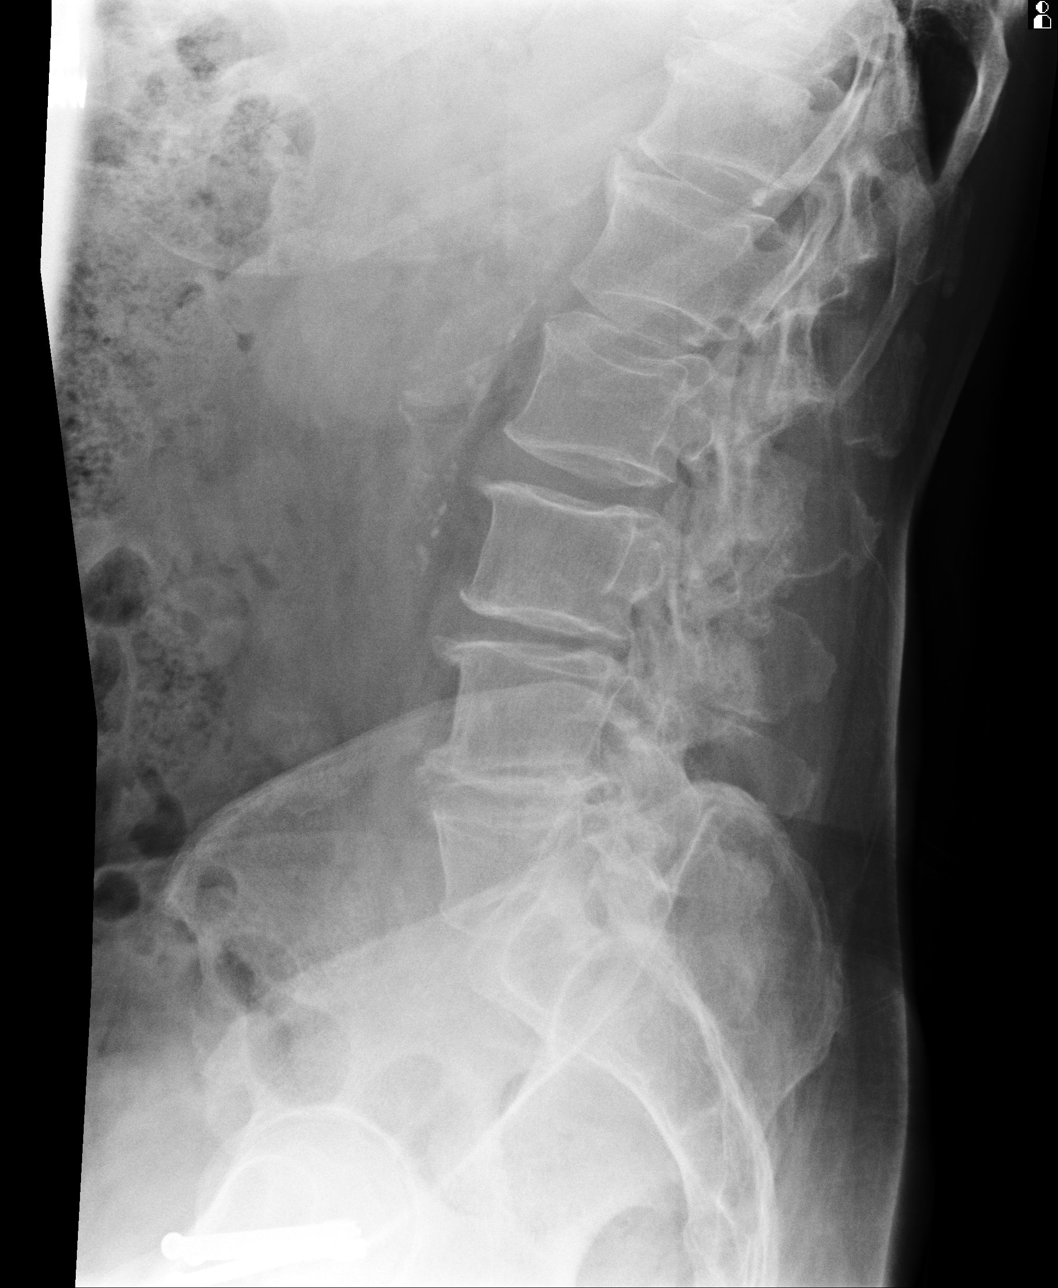
[im 2/4]
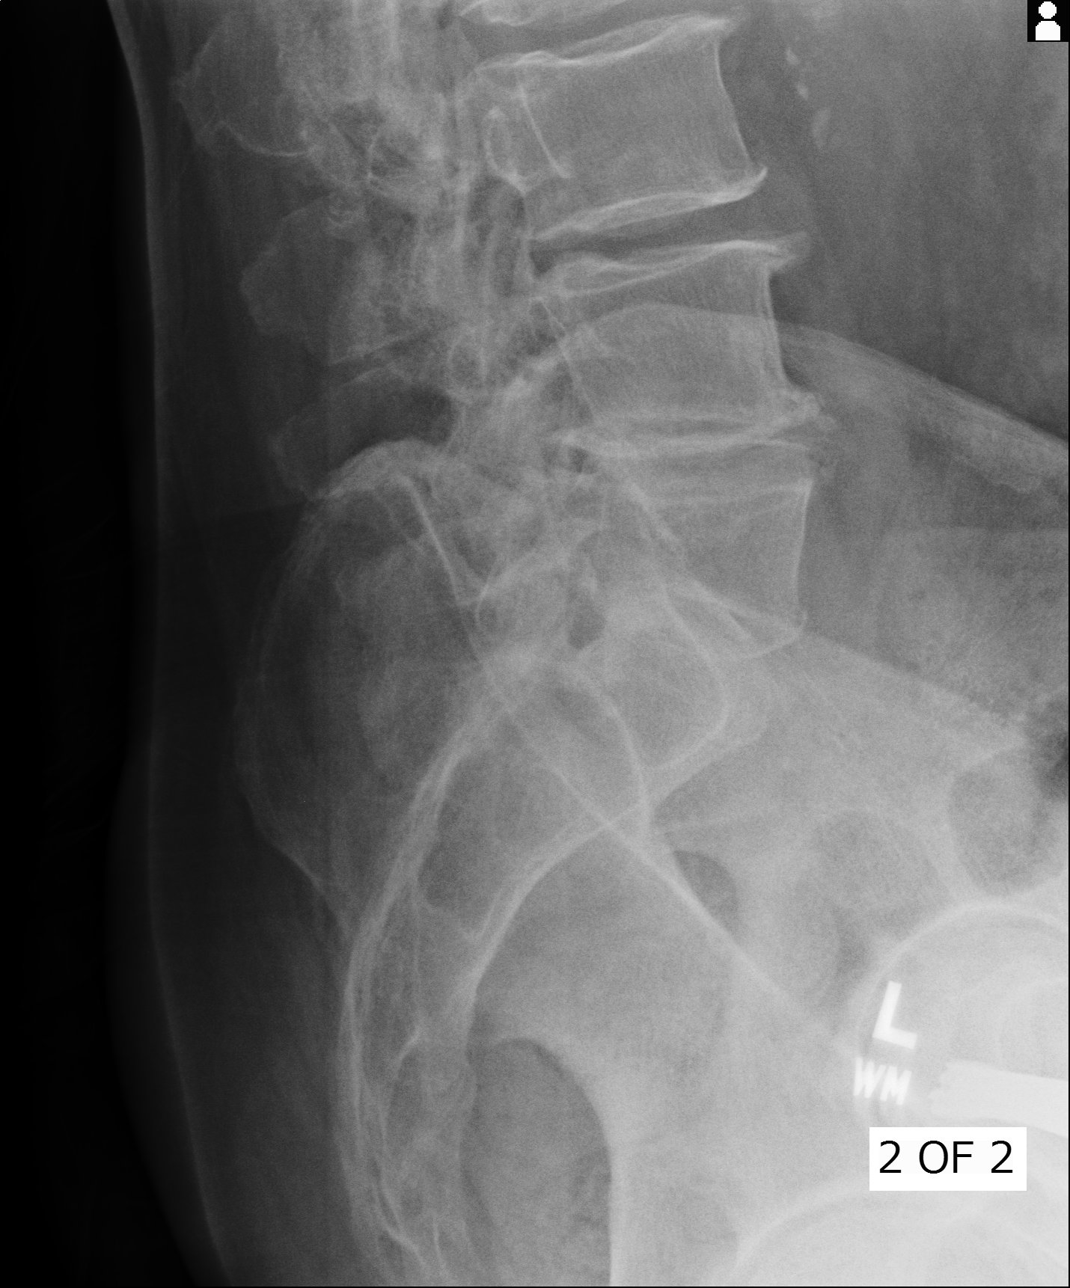
[im 3/4]
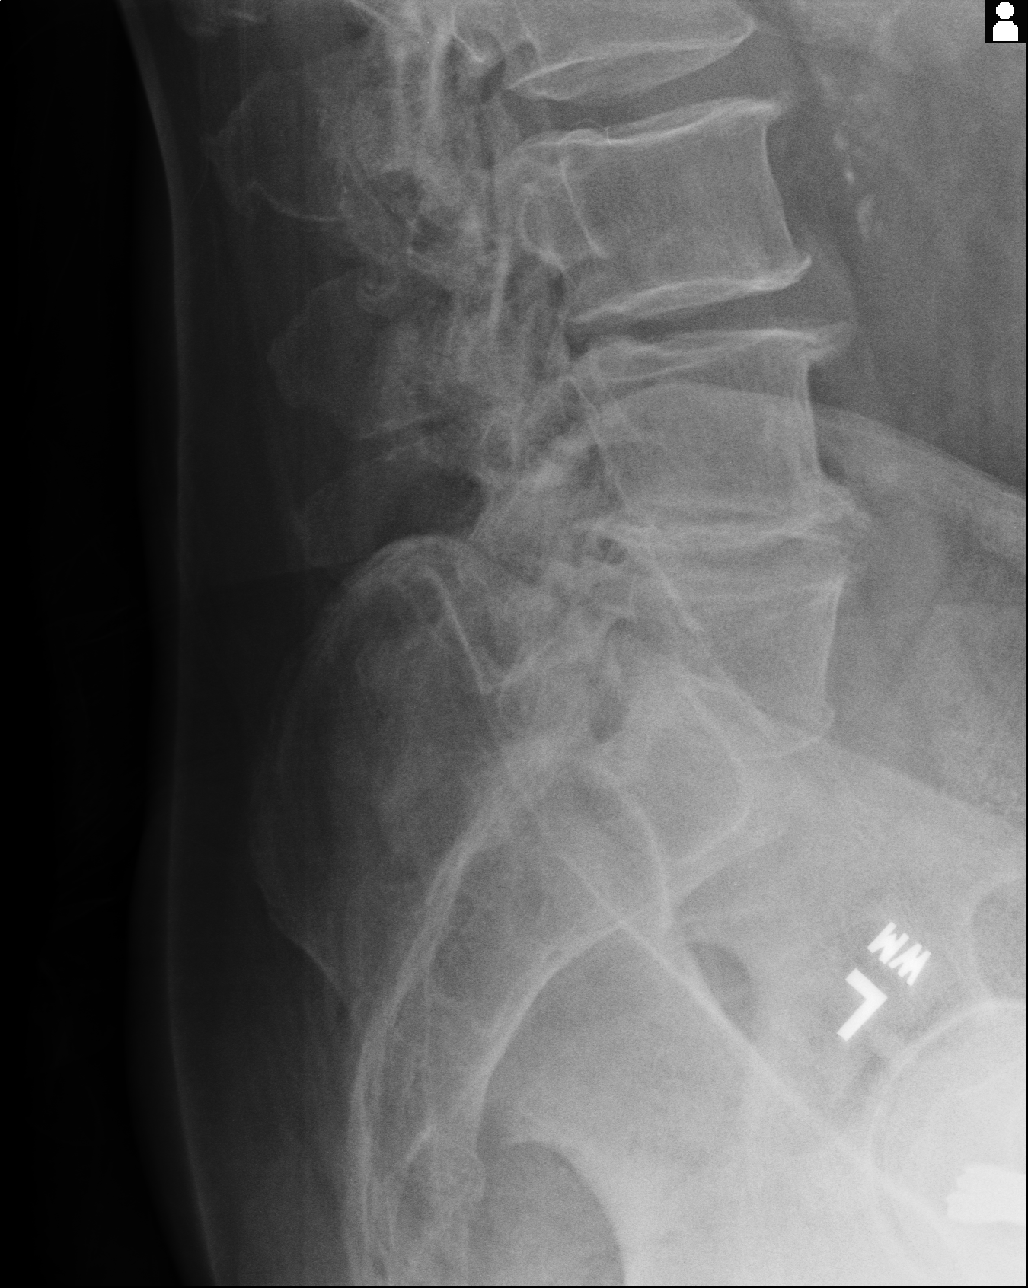
[im 4/4]
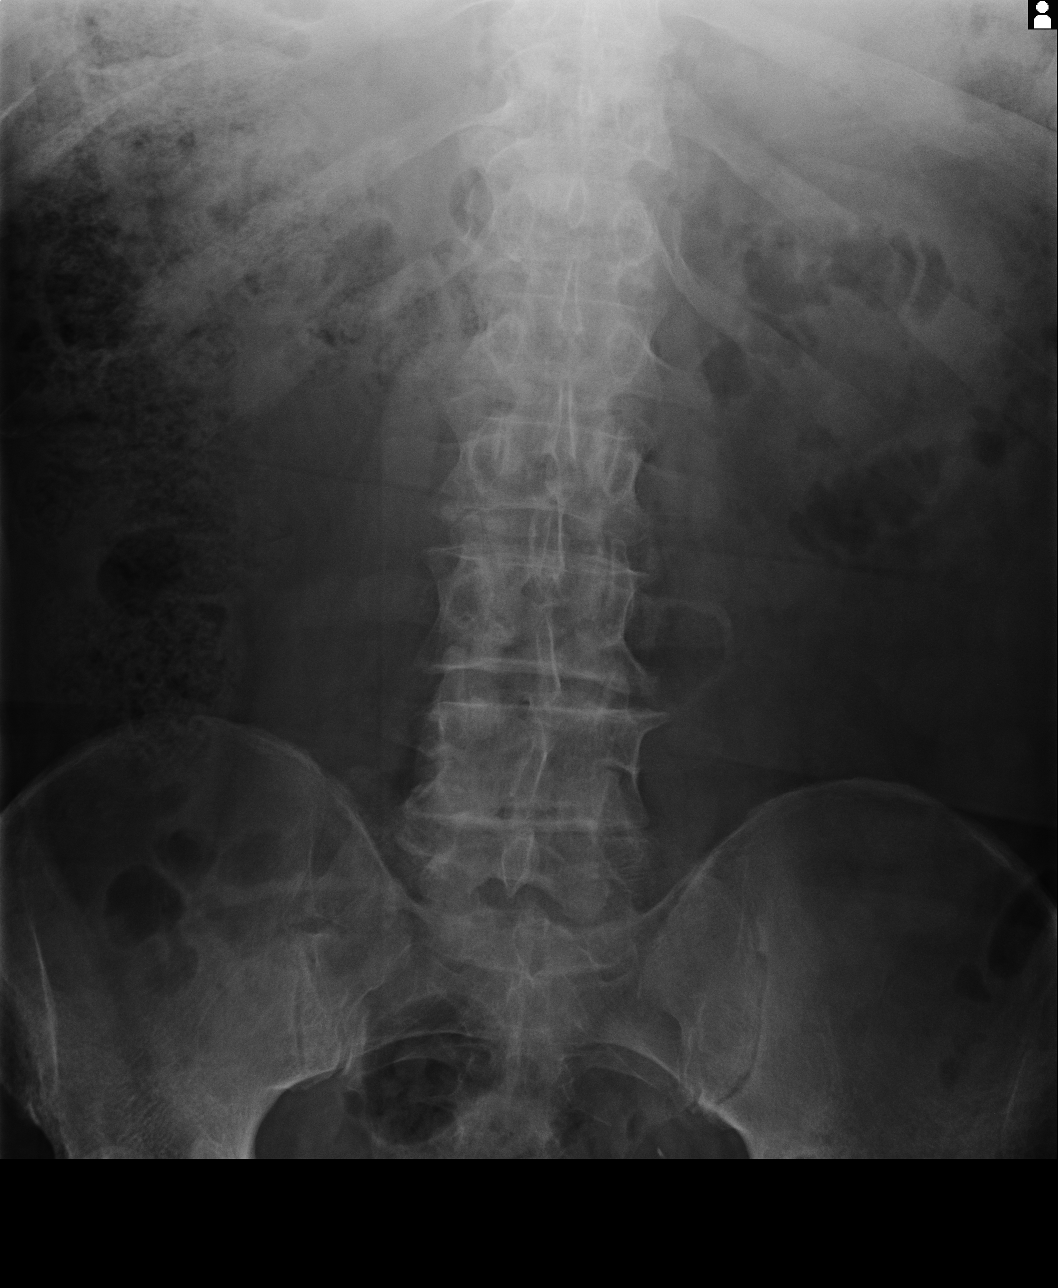

[4 of 4 positions shown; findings below may reference images not displayed]

FINDINGS: Degenerative disc disease throughout the lumbar spine, most
pronounced from L3-4 through L5-S1. Degenerative facet disease
diffusely throughout the lumbar spine. Normal alignment. No
fracture.
IMPRESSION: Spondylosis.  No acute bony abnormality.

## 2016-08-09 IMAGING — MR MR LUMBAR SPINE W/O CM
5 series · 35 of 48 positions shown · non-contrast
Comparison: Lumbar spine radiographs 09/17/2015

CLINICAL DATA: Low back pain with bilateral lower leg/foot numbness
for many years. Remote history of cyst removal from the spine.

EXAM:
MRI LUMBAR SPINE WITHOUT CONTRAST
TECHNIQUE: Multiplanar, multisequence MR imaging of the lumbar spine was
performed. No intravenous contrast was administered.

[Series 2: T2 · sagittal · 4.0mm · 0.81mm/px · 6 of 17 slices shown (1 of 2)]
[im 1/17]
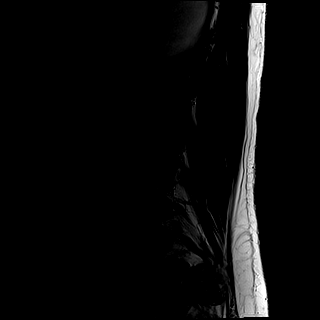
[im 4/17]
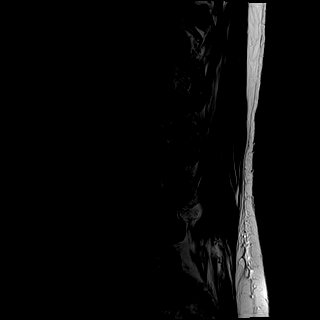
[im 7/17]
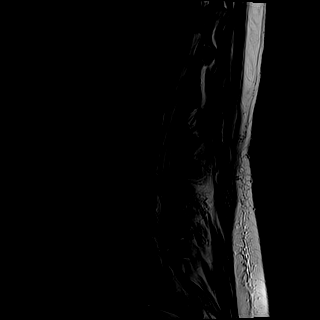
[im 10/17]
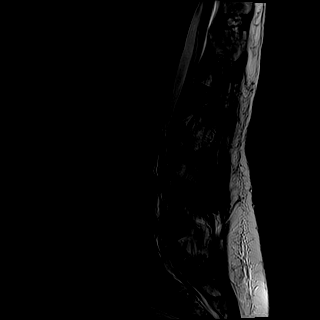
[im 13/17]
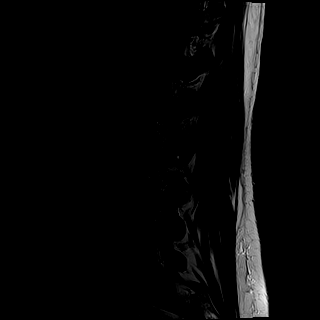
[im 17/17]
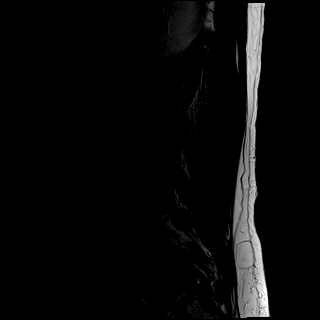

[Series 3: T1 · sagittal · 4.0mm · 0.81mm/px · 5 of 15 slices shown (1 of 2)]
[im 1/15]
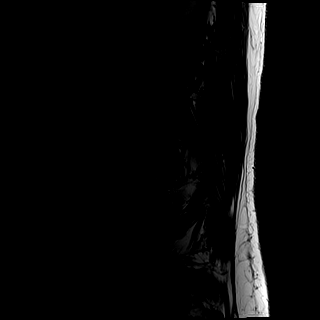
[im 4/15]
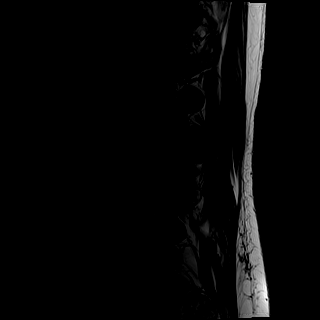
[im 8/15]
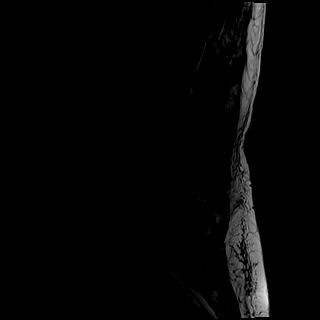
[im 11/15]
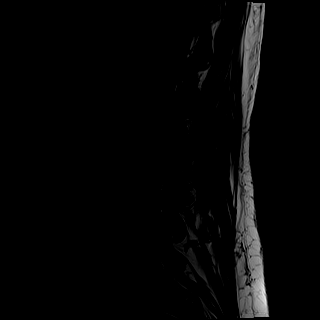
[im 15/15]
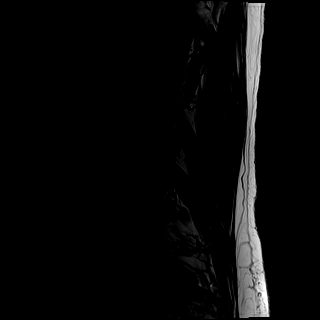

[Series 4: STIR · sagittal · 4.0mm · 1.02mm/px · 4 of 15 slices shown]
[im 1/15]
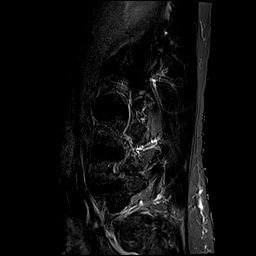
[im 4/15]
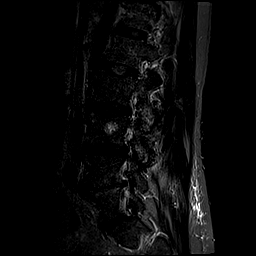
[im 8/15]
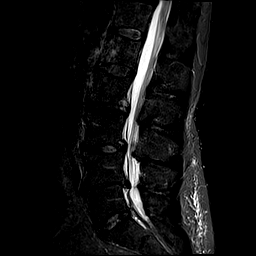
[im 11/15]
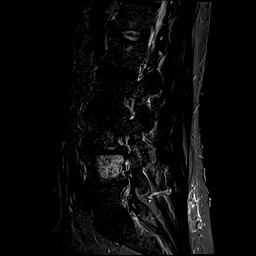

[Series 5: T2 · axial · 4.0mm · 0.78mm/px · z∈[-198,+39]mm · 10 of 46 slices shown (2 of 2)]
[im 4/46]
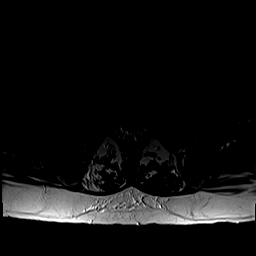
[im 7/46]
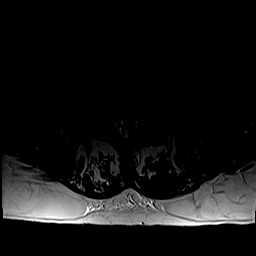
[im 10/46]
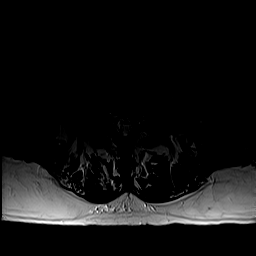
[im 16/46]
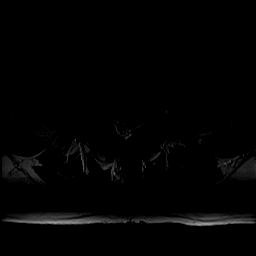
[im 22/46]
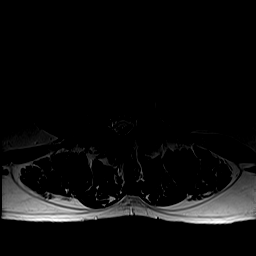
[im 25/46]
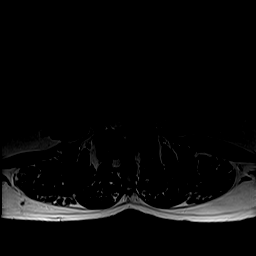
[im 28/46]
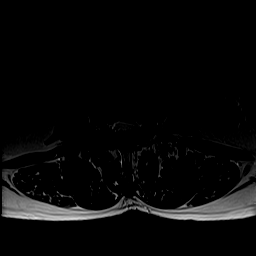
[im 34/46]
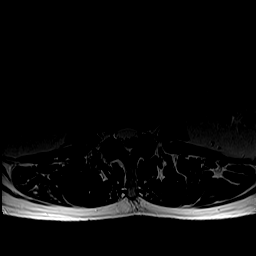
[im 40/46]
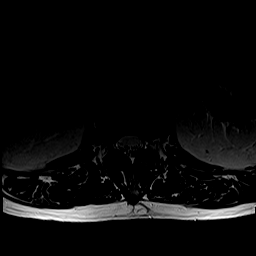
[im 46/46]
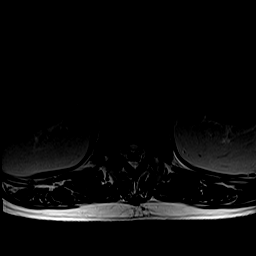

[Series 6: T1 · axial · 4.0mm · 0.39mm/px · z∈[-198,+39]mm · 10 of 46 slices shown (2 of 2)]
[im 4/46]
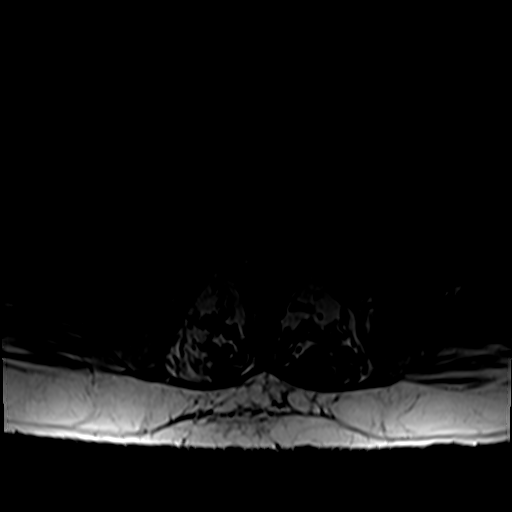
[im 7/46]
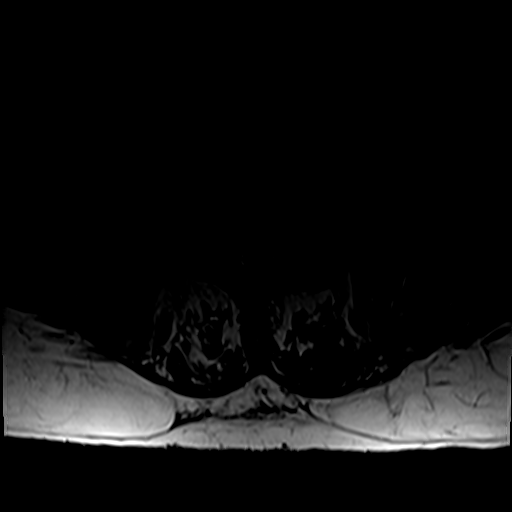
[im 10/46]
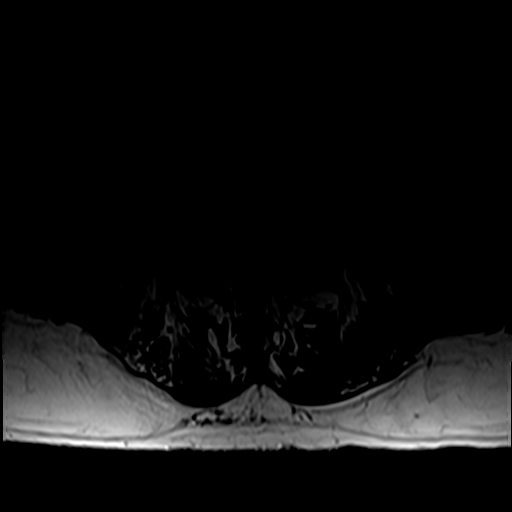
[im 16/46]
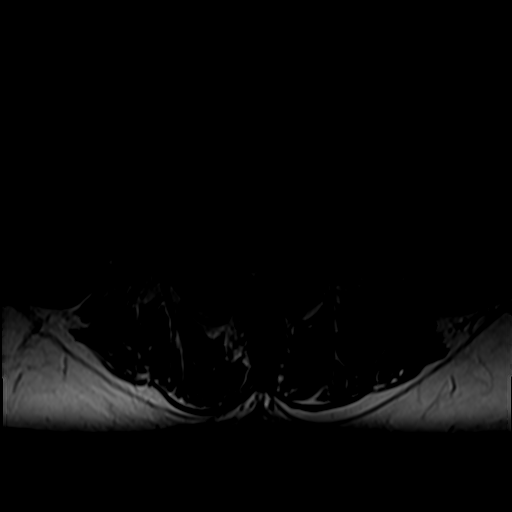
[im 22/46]
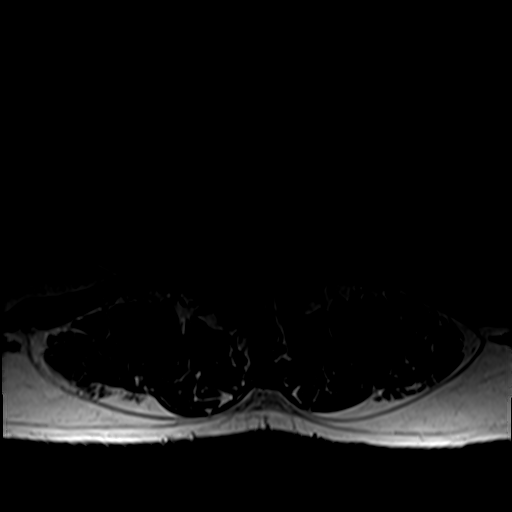
[im 25/46]
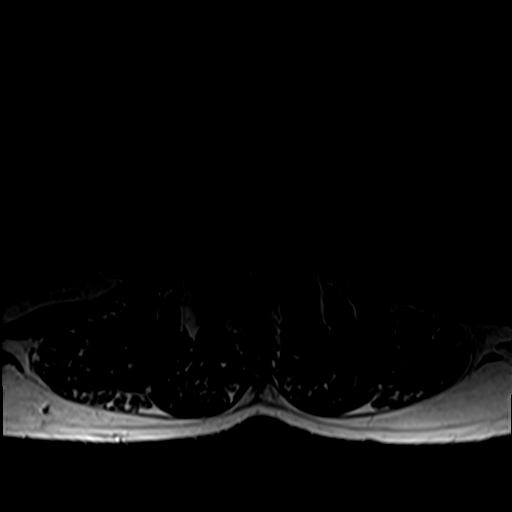
[im 28/46]
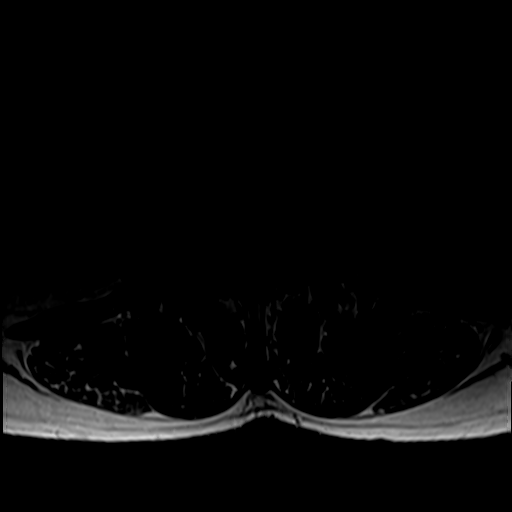
[im 34/46]
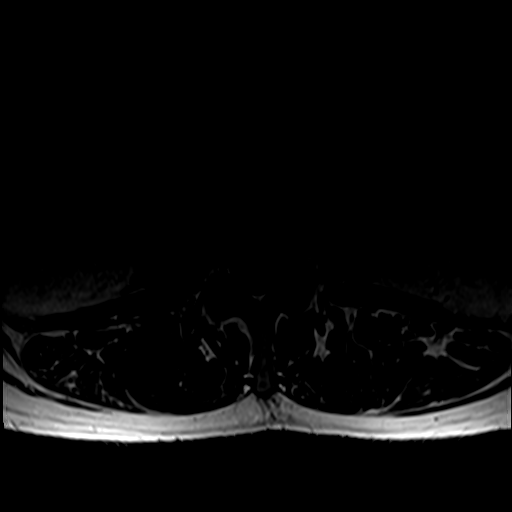
[im 40/46]
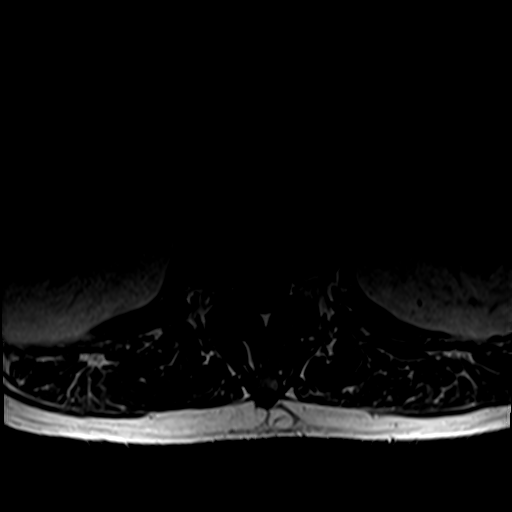
[im 46/46]
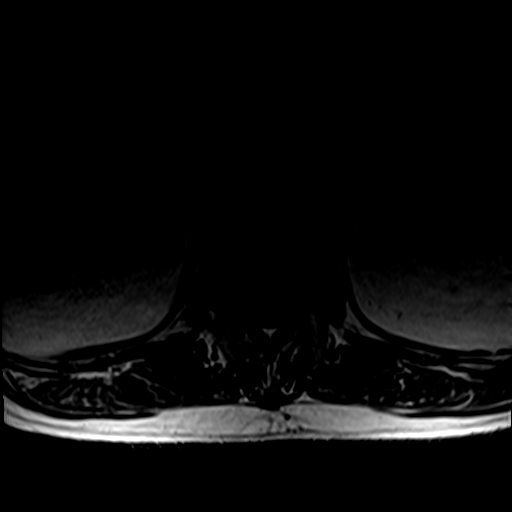

[35 of 48 positions shown; findings below may reference images not displayed]

FINDINGS: There is slight right convex curvature of the lumbar spine. Grade 1
retrolisthesis of L4 on L5 measures 6 mm. Vertebral body heights are
preserved. A large hemangioma is noted in the L4 vertebral body,
with scattered smaller hemangiomas elsewhere. A sclerotic lesion is
noted in the T12 vertebral body. The mid to lower lumbar spinal
canal is mildly small in caliber diffusely due to congenitally short
pedicles. Bilateral renal sinus cysts are noted. The conus
medullaris terminates at T12-L1.

L1-2: Minimal disc bulging without stenosis.

L2-3: Mild disc bulging asymmetric to the left and moderate right
and mild left facet hypertrophy result in mild spinal stenosis and
mild left lateral recess stenosis without significant neural
foraminal stenosis.

L3-4: Mild disc bulging, endplate spurring, congenitally short
pedicles, and moderate facet hypertrophy result in moderate spinal
stenosis, mild right and moderate left lateral recess stenosis, and
no significant neural foraminal stenosis.

L4-5: Listhesis with diffuse disc uncovering/disc bulging, small
central pseudo disc extrusion, severe disc space height loss,
congenitally short pedicles, and mild facet hypertrophy result in
moderate spinal stenosis, moderate bilateral lateral recess
stenosis, and moderate right and mild left neural foraminal
stenosis.

L5-S1:  Negative.
IMPRESSION: Moderate multilevel lumbar disc degeneration, worst at L4-5 where
there is moderate spinal stenosis and right neural foraminal
stenosis.

## 2016-08-27 ENCOUNTER — Encounter: Payer: Medicare Other | Admitting: Family Medicine

## 2016-09-01 ENCOUNTER — Telehealth: Payer: Self-pay | Admitting: Family Medicine

## 2016-09-01 DIAGNOSIS — I10 Essential (primary) hypertension: Secondary | ICD-10-CM

## 2016-09-01 MED ORDER — LOSARTAN POTASSIUM-HCTZ 100-25 MG PO TABS: 1.0000 | ORAL_TABLET | Freq: Every day | ORAL | 4 refills | Status: DC

## 2016-09-01 NOTE — Telephone Encounter (Signed)
Pt's wife called stated she is returning Tiffany's call. Please call pt's wife ASAP. Thanks.

## 2016-09-01 NOTE — Telephone Encounter (Signed)
Refill on Losartan/hctz sent to [facility]

## 2016-09-03 ENCOUNTER — Encounter (INDEPENDENT_AMBULATORY_CARE_PROVIDER_SITE_OTHER): Payer: Self-pay

## 2016-09-15 ENCOUNTER — Encounter: Payer: Medicare Other | Admitting: Family Medicine

## 2016-12-01 ENCOUNTER — Encounter: Payer: Self-pay | Admitting: Family Medicine

## 2016-12-01 ENCOUNTER — Ambulatory Visit (INDEPENDENT_AMBULATORY_CARE_PROVIDER_SITE_OTHER): Payer: Medicare Other | Admitting: Family Medicine

## 2016-12-01 VITALS — BP 146/78 | HR 83 | Temp 98.0°F | Ht 71.65 in | Wt 228.0 lb

## 2016-12-01 DIAGNOSIS — N390 Urinary tract infection, site not specified: Secondary | ICD-10-CM

## 2016-12-01 DIAGNOSIS — N400 Enlarged prostate without lower urinary tract symptoms: Secondary | ICD-10-CM | POA: Diagnosis not present

## 2016-12-01 DIAGNOSIS — R8281 Pyuria: Secondary | ICD-10-CM

## 2016-12-01 DIAGNOSIS — E785 Hyperlipidemia, unspecified: Secondary | ICD-10-CM

## 2016-12-01 DIAGNOSIS — Z1329 Encounter for screening for other suspected endocrine disorder: Secondary | ICD-10-CM

## 2016-12-01 DIAGNOSIS — Z125 Encounter for screening for malignant neoplasm of prostate: Secondary | ICD-10-CM

## 2016-12-01 DIAGNOSIS — Z0000 Encounter for general adult medical examination without abnormal findings: Secondary | ICD-10-CM

## 2016-12-01 DIAGNOSIS — M48061 Spinal stenosis, lumbar region without neurogenic claudication: Secondary | ICD-10-CM | POA: Insufficient documentation

## 2016-12-01 DIAGNOSIS — I10 Essential (primary) hypertension: Secondary | ICD-10-CM | POA: Diagnosis not present

## 2016-12-01 DIAGNOSIS — Z23 Encounter for immunization: Secondary | ICD-10-CM

## 2016-12-01 MED ORDER — LOSARTAN POTASSIUM-HCTZ 100-25 MG PO TABS: 1.0000 | ORAL_TABLET | Freq: Every day | ORAL | 4 refills | Status: DC

## 2016-12-01 MED ORDER — METOPROLOL SUCCINATE ER 50 MG PO TB24: 50.0000 mg | ORAL_TABLET | Freq: Every day | ORAL | 3 refills | Status: DC

## 2016-12-01 MED ORDER — ZOSTER VACCINE LIVE 19400 UNT/0.65ML ~~LOC~~ SUSR: 0.6500 mL | Freq: Once | SUBCUTANEOUS | 0 refills | Status: AC

## 2016-12-01 NOTE — Assessment & Plan Note (Signed)
Stable

## 2016-12-01 NOTE — Progress Notes (Signed)
BP (!) 146/78   Pulse 83   Temp 98 F (36.7 C) (Oral)   Ht 5' 11.65" (1.82 m)   Wt 228 lb (103.4 kg)   SpO2 98%   BMI 31.22 kg/m    Subjective:    Patient ID: Drew Mcmahon, male    DOB: Nov 27, 1945, 71 y.o.   MRN: [id number]  HPI: Drew Mcmahon is a 71 y.o. male  Chief Complaint  Patient presents with  . Follow-up  . Annual Exam  . Hypertension    BP medication, feet swelling while on his feet alot, breaking out due to meds per dermatology  Patient all in all doing well except for has been on his feet a lot with cleaning out a house of a deceased relative. Noticed blood pressures up some also is continued swelling of his feet and no other real side effects cough cold or other issues. Takes occasional triamcinolone cream for occasional rash but otherwise doing well. On ankles with swelling has been to a dermatologist was still ongoing issues of rash on his ankles.  Relevant past medical, surgical, family and social history reviewed and updated as indicated. Interim medical history since our last visit reviewed. Allergies and medications reviewed and updated.  Review of Systems  Constitutional: Negative.   HENT: Negative.   Eyes: Negative.   Respiratory: Negative.   Cardiovascular: Negative.   Gastrointestinal: Negative.   Endocrine: Negative.   Genitourinary: Negative.   Musculoskeletal: Negative.   Skin: Negative.   Allergic/Immunologic: Negative.   Neurological: Negative.   Hematological: Negative.   Psychiatric/Behavioral: Negative.     Per HPI unless specifically indicated above     Objective:    BP (!) 146/78   Pulse 83   Temp 98 F (36.7 C) (Oral)   Ht 5' 11.65" (1.82 m)   Wt 228 lb (103.4 kg)   SpO2 98%   BMI 31.22 kg/m   Wt Readings from Last 3 Encounters:  12/01/16 228 lb (103.4 kg)  06/12/16 238 lb (108 kg)  03/04/16 230 lb (104.3 kg)    Physical Exam  Constitutional: He is oriented to person, place, and time. He appears well-developed and  well-nourished.  HENT:  Head: Normocephalic and atraumatic.  Right Ear: External ear normal.  Left Ear: External ear normal.  Eyes: Conjunctivae and EOM are normal. Pupils are equal, round, and reactive to light.  Neck: Normal range of motion. Neck supple.  Cardiovascular: Normal rate, regular rhythm, normal heart sounds and intact distal pulses.   Pulmonary/Chest: Effort normal and breath sounds normal.  Abdominal: Soft. Bowel sounds are normal. There is no splenomegaly or hepatomegaly.  Genitourinary: Rectum normal, prostate normal and penis normal.  Genitourinary Comments: Mild pyuria will check urine C and S  Musculoskeletal: Normal range of motion.  Neurological: He is alert and oriented to person, place, and time. He has normal reflexes.  Skin: No rash noted. No erythema.  Psychiatric: He has a normal mood and affect. His behavior is normal. Judgment and thought content normal.     Results for orders placed or performed in visit on 03/04/16  Bayer DCA Hb A1c Waived  Result Value Ref Range   Bayer DCA Hb A1c Waived 5.6 Q000111Q %  Basic metabolic panel  Result Value Ref Range   Glucose 88 65 - 99 mg/dL   BUN 12 8 - 27 mg/dL   Creatinine, Ser 0.97 0.76 - 1.27 mg/dL   GFR calc non Af Amer 79 >59 mL/min/1.73  GFR calc Af Amer 92 >59 mL/min/1.73   BUN/Creatinine Ratio 12 10 - 24   Sodium 140 134 - 144 mmol/L   Potassium 4.2 3.5 - 5.2 mmol/L   Chloride 99 96 - 106 mmol/L   CO2 25 18 - 29 mmol/L   Calcium 9.5 8.6 - 10.2 mg/dL  LP+ALT+AST Piccolo, Waived  Result Value Ref Range   ALT (SGPT) Piccolo, Waived 30 10 - 47 U/L   AST (SGOT) Piccolo, Waived 26 11 - 38 U/L   Cholesterol Piccolo, Waived 205 (H) <200 mg/dL   HDL Chol Piccolo, Waived 67 >59 mg/dL   Triglycerides Piccolo,Waived 89 <150 mg/dL   Chol/HDL Ratio Piccolo,Waive 3.1 mg/dL   LDL Chol Calc Piccolo Waived 120 (H) <100 mg/dL   VLDL Chol Calc Piccolo,Waive 18 <30 mg/dL  Hepatitis C Antibody  Result Value Ref Range    Hep C Virus Ab <0.1 0.0 - 0.9 s/co ratio      Assessment & Plan:   Problem List Items Addressed This Visit      Cardiovascular and Mediastinum   Hypertension    Discuss hypertension poor control also with possible edema secondary to amlodipine will stop amlodipine start metoprolol 50 mg observe blood pressure and edema response      Relevant Medications   losartan-hydrochlorothiazide (HYZAAR) 100-25 MG tablet   metoprolol succinate (TOPROL-XL) 50 MG 24 hr tablet   Other Relevant Orders   CBC with Differential/Platelet   Comprehensive metabolic panel   Urinalysis, Routine w reflex microscopic     Genitourinary   BPH (benign prostatic hyperplasia)     Other   Hyperlipidemia    Diet controled      Relevant Medications   losartan-hydrochlorothiazide (HYZAAR) 100-25 MG tablet   metoprolol succinate (TOPROL-XL) 50 MG 24 hr tablet   Other Relevant Orders   CBC with Differential/Platelet   Comprehensive metabolic panel   Lipid panel   Urinalysis, Routine w reflex microscopic   Spinal stenosis at L4-L5 level    Stable        Other Visit Diagnoses    Annual physical exam    -  Primary   Relevant Orders   CBC with Differential/Platelet   Comprehensive metabolic panel   Lipid panel   PSA   TSH   Urinalysis, Routine w reflex microscopic   Need for Zostavax administration       Prostate cancer screening       Relevant Orders   PSA   Thyroid disorder screen       Relevant Orders   TSH   Need for influenza vaccination       Pyuria       Relevant Orders   Urine culture       Follow up plan: Return in about 4 weeks (around 12/29/2016) for BP check.

## 2016-12-01 NOTE — Assessment & Plan Note (Signed)
Discuss hypertension poor control also with possible edema secondary to amlodipine will stop amlodipine start metoprolol 50 mg observe blood pressure and edema response

## 2016-12-01 NOTE — Assessment & Plan Note (Signed)
Diet controled 

## 2016-12-02 LAB — CBC WITH DIFFERENTIAL/PLATELET
BASOS: 0 %
Basophils Absolute: 0 10*3/uL (ref 0.0–0.2)
EOS (ABSOLUTE): 0.2 10*3/uL (ref 0.0–0.4)
Eos: 2 %
Hematocrit: 44.1 % (ref 37.5–51.0)
Hemoglobin: 15.3 g/dL (ref 13.0–17.7)
IMMATURE GRANS (ABS): 0 10*3/uL (ref 0.0–0.1)
Immature Granulocytes: 0 %
LYMPHS: 34 %
Lymphocytes Absolute: 3.3 10*3/uL — ABNORMAL HIGH (ref 0.7–3.1)
MCH: 31.5 pg (ref 26.6–33.0)
MCHC: 34.7 g/dL (ref 31.5–35.7)
MCV: 91 fL (ref 79–97)
MONOS ABS: 0.8 10*3/uL (ref 0.1–0.9)
Monocytes: 8 %
NEUTROS ABS: 5.2 10*3/uL (ref 1.4–7.0)
Neutrophils: 56 %
PLATELETS: 211 10*3/uL (ref 150–379)
RBC: 4.86 x10E6/uL (ref 4.14–5.80)
RDW: 13.3 % (ref 12.3–15.4)
WBC: 9.6 10*3/uL (ref 3.4–10.8)

## 2016-12-02 LAB — URINALYSIS, ROUTINE W REFLEX MICROSCOPIC
BILIRUBIN UA: NEGATIVE
GLUCOSE, UA: NEGATIVE
KETONES UA: NEGATIVE
Nitrite, UA: NEGATIVE
PROTEIN UA: NEGATIVE
RBC UA: NEGATIVE
SPEC GRAV UA: 1.015 (ref 1.005–1.030)
UUROB: 1 mg/dL (ref 0.2–1.0)
pH, UA: 7 (ref 5.0–7.5)

## 2016-12-02 LAB — LIPID PANEL
Chol/HDL Ratio: 3.3 ratio units (ref 0.0–5.0)
Cholesterol, Total: 203 mg/dL — ABNORMAL HIGH (ref 100–199)
HDL: 61 mg/dL (ref >39)
LDL Calculated: 125 mg/dL — ABNORMAL HIGH (ref 0–99)
Triglycerides: 87 mg/dL (ref 0–149)
VLDL Cholesterol Cal: 17 mg/dL (ref 5–40)

## 2016-12-02 LAB — COMPREHENSIVE METABOLIC PANEL
A/G RATIO: 1.6 (ref 1.2–2.2)
ALT: 18 IU/L (ref 0–44)
AST: 15 IU/L (ref 0–40)
Albumin: 4.2 g/dL (ref 3.5–4.8)
Alkaline Phosphatase: 93 IU/L (ref 39–117)
BILIRUBIN TOTAL: 0.7 mg/dL (ref 0.0–1.2)
BUN / CREAT RATIO: 17 (ref 10–24)
BUN: 15 mg/dL (ref 8–27)
CHLORIDE: 98 mmol/L (ref 96–106)
CO2: 25 mmol/L (ref 18–29)
Calcium: 9.3 mg/dL (ref 8.6–10.2)
Creatinine, Ser: 0.89 mg/dL (ref 0.76–1.27)
GFR calc non Af Amer: 87 mL/min/{1.73_m2} (ref >59)
GFR, EST AFRICAN AMERICAN: 100 mL/min/{1.73_m2} (ref >59)
Globulin, Total: 2.7 g/dL (ref 1.5–4.5)
Glucose: 126 mg/dL — ABNORMAL HIGH (ref 65–99)
POTASSIUM: 3.7 mmol/L (ref 3.5–5.2)
Sodium: 138 mmol/L (ref 134–144)
TOTAL PROTEIN: 6.9 g/dL (ref 6.0–8.5)

## 2016-12-02 LAB — TSH: TSH: 1.47 u[IU]/mL (ref 0.450–4.500)

## 2016-12-02 LAB — MICROSCOPIC EXAMINATION

## 2016-12-02 LAB — PSA: PROSTATE SPECIFIC AG, SERUM: 4.1 ng/mL — AB (ref 0.0–4.0)

## 2016-12-03 ENCOUNTER — Telehealth: Payer: Self-pay | Admitting: Family Medicine

## 2016-12-03 ENCOUNTER — Encounter: Payer: Self-pay | Admitting: Family Medicine

## 2016-12-03 DIAGNOSIS — R972 Elevated prostate specific antigen [PSA]: Secondary | ICD-10-CM

## 2016-12-03 LAB — URINE CULTURE

## 2016-12-03 NOTE — Telephone Encounter (Signed)
Phone call Discussed with patient slight elevation PSA will recheck in a couple of months. Discussed cholesterol patient will also do better with diet.

## 2016-12-31 DIAGNOSIS — L280 Lichen simplex chronicus: Secondary | ICD-10-CM | POA: Diagnosis not present

## 2016-12-31 DIAGNOSIS — L821 Other seborrheic keratosis: Secondary | ICD-10-CM | POA: Diagnosis not present

## 2016-12-31 DIAGNOSIS — L570 Actinic keratosis: Secondary | ICD-10-CM | POA: Diagnosis not present

## 2017-01-21 ENCOUNTER — Ambulatory Visit: Payer: Medicare Other | Admitting: Family Medicine

## 2017-01-26 ENCOUNTER — Encounter: Payer: Self-pay | Admitting: Family Medicine

## 2017-01-26 ENCOUNTER — Ambulatory Visit (INDEPENDENT_AMBULATORY_CARE_PROVIDER_SITE_OTHER): Payer: Medicare Other | Admitting: Family Medicine

## 2017-01-26 DIAGNOSIS — I10 Essential (primary) hypertension: Secondary | ICD-10-CM | POA: Diagnosis not present

## 2017-01-26 DIAGNOSIS — N401 Enlarged prostate with lower urinary tract symptoms: Secondary | ICD-10-CM | POA: Diagnosis not present

## 2017-01-26 DIAGNOSIS — M48061 Spinal stenosis, lumbar region without neurogenic claudication: Secondary | ICD-10-CM

## 2017-01-26 DIAGNOSIS — L259 Unspecified contact dermatitis, unspecified cause: Secondary | ICD-10-CM

## 2017-01-26 DIAGNOSIS — G603 Idiopathic progressive neuropathy: Secondary | ICD-10-CM | POA: Diagnosis not present

## 2017-01-26 MED ORDER — METOPROLOL SUCCINATE ER 50 MG PO TB24: 50.0000 mg | ORAL_TABLET | Freq: Every day | ORAL | 4 refills | Status: DC

## 2017-01-26 NOTE — Assessment & Plan Note (Signed)
Doing well 

## 2017-01-26 NOTE — Assessment & Plan Note (Signed)
stable °

## 2017-01-26 NOTE — Progress Notes (Signed)
BP 132/70 (BP Location: Left Arm)   Pulse (!) 55   Temp 97.9 F (36.6 C)   Ht 6' (1.829 m)   Wt 230 lb 3.2 oz (104.4 kg)   SpO2 99%   BMI 31.22 kg/m    Subjective:    Patient ID: Drew Mcmahon, male    DOB: 05/02/1946, 71 y.o.   MRN: [id number]  HPI: Drew Mcmahon is a 71 y.o. male  Chief Complaint  Patient presents with  . Follow-up  . Hypertension  . Benign Prostatic Hypertrophy  will recheck PSA Patient all in all doing well blood pressure good control no issues from medications taken faithfully. Change from discontinuing amlodipine to metoprolol was done well  Peripheral neuropathy stable not really getting worse but not better not enough for medications. Back is been stable with no exacerbation of spinal stenosis symptoms. BPH doing well not ready for medications at this point. Relevant past medical, surgical, family and social history reviewed and updated as indicated. Interim medical history since our last visit reviewed. Allergies and medications reviewed and updated.  Review of Systems  Constitutional: Negative.   Respiratory: Negative.   Cardiovascular: Negative.   Gastrointestinal: Negative.   Genitourinary: Negative.     Per HPI unless specifically indicated above     Objective:    BP 132/70 (BP Location: Left Arm)   Pulse (!) 55   Temp 97.9 F (36.6 C)   Ht 6' (1.829 m)   Wt 230 lb 3.2 oz (104.4 kg)   SpO2 99%   BMI 31.22 kg/m   Wt Readings from Last 3 Encounters:  01/26/17 230 lb 3.2 oz (104.4 kg)  12/01/16 228 lb (103.4 kg)  06/12/16 238 lb (108 kg)    Physical Exam  Constitutional: He is oriented to person, place, and time. He appears well-developed and well-nourished.  HENT:  Head: Normocephalic and atraumatic.  Eyes: Conjunctivae and EOM are normal. Pupils are equal, round, and reactive to light.  Neck: Normal range of motion.  Cardiovascular: Normal rate, regular rhythm and normal heart sounds.   Pulmonary/Chest: Effort normal  and breath sounds normal.  Abdominal: There is no splenomegaly or hepatomegaly.  Neurological: He is alert and oriented to person, place, and time.  Skin: No rash noted. No erythema.  Psychiatric: He has a normal mood and affect. His behavior is normal. Judgment and thought content normal.    Results for orders placed or performed in visit on 12/01/16  Urine culture  Result Value Ref Range   Urine Culture, Routine Final report    Urine Culture result 1 Comment   Microscopic Examination  Result Value Ref Range   WBC, UA 6-10 (A) 0 - 5 /hpf   RBC, UA 0-2 0 - 2 /hpf   Epithelial Cells (non renal) 0-10 0 - 10 /hpf   Bacteria, UA Few None seen/Few  CBC with Differential/Platelet  Result Value Ref Range   WBC 9.6 3.4 - 10.8 x10E3/uL   RBC 4.86 4.14 - 5.80 x10E6/uL   Hemoglobin 15.3 13.0 - 17.7 g/dL   Hematocrit 44.1 37.5 - 51.0 %   MCV 91 79 - 97 fL   MCH 31.5 26.6 - 33.0 pg   MCHC 34.7 31.5 - 35.7 g/dL   RDW 13.3 12.3 - 15.4 %   Platelets 211 150 - 379 x10E3/uL   Neutrophils 56 Not Estab. %   Lymphs 34 Not Estab. %   Monocytes 8 Not Estab. %   Eos 2 Not  Estab. %   Basos 0 Not Estab. %   Neutrophils Absolute 5.2 1.4 - 7.0 x10E3/uL   Lymphocytes Absolute 3.3 (H) 0.7 - 3.1 x10E3/uL   Monocytes Absolute 0.8 0.1 - 0.9 x10E3/uL   EOS (ABSOLUTE) 0.2 0.0 - 0.4 x10E3/uL   Basophils Absolute 0.0 0.0 - 0.2 x10E3/uL   Immature Granulocytes 0 Not Estab. %   Immature Grans (Abs) 0.0 0.0 - 0.1 x10E3/uL  Comprehensive metabolic panel  Result Value Ref Range   Glucose 126 (H) 65 - 99 mg/dL   BUN 15 8 - 27 mg/dL   Creatinine, Ser 0.89 0.76 - 1.27 mg/dL   GFR calc non Af Amer 87 >59 mL/min/1.73   GFR calc Af Amer 100 >59 mL/min/1.73   BUN/Creatinine Ratio 17 10 - 24   Sodium 138 134 - 144 mmol/L   Potassium 3.7 3.5 - 5.2 mmol/L   Chloride 98 96 - 106 mmol/L   CO2 25 18 - 29 mmol/L   Calcium 9.3 8.6 - 10.2 mg/dL   Total Protein 6.9 6.0 - 8.5 g/dL   Albumin 4.2 3.5 - 4.8 g/dL    Globulin, Total 2.7 1.5 - 4.5 g/dL   Albumin/Globulin Ratio 1.6 1.2 - 2.2   Bilirubin Total 0.7 0.0 - 1.2 mg/dL   Alkaline Phosphatase 93 39 - 117 IU/L   AST 15 0 - 40 IU/L   ALT 18 0 - 44 IU/L  Lipid panel  Result Value Ref Range   Cholesterol, Total 203 (H) 100 - 199 mg/dL   Triglycerides 87 0 - 149 mg/dL   HDL 61 >39 mg/dL   VLDL Cholesterol Cal 17 5 - 40 mg/dL   LDL Calculated 125 (H) 0 - 99 mg/dL   Chol/HDL Ratio 3.3 0.0 - 5.0 ratio units  PSA  Result Value Ref Range   Prostate Specific Ag, Serum 4.1 (H) 0.0 - 4.0 ng/mL  TSH  Result Value Ref Range   TSH 1.470 0.450 - 4.500 uIU/mL  Urinalysis, Routine w reflex microscopic  Result Value Ref Range   Specific Gravity, UA 1.015 1.005 - 1.030   pH, UA 7.0 5.0 - 7.5   Color, UA Yellow Yellow   Appearance Ur Clear Clear   Leukocytes, UA 1+ (A) Negative   Protein, UA Negative Negative/Trace   Glucose, UA Negative Negative   Ketones, UA Negative Negative   RBC, UA Negative Negative   Bilirubin, UA Negative Negative   Urobilinogen, Ur 1.0 0.2 - 1.0 mg/dL   Nitrite, UA Negative Negative   Microscopic Examination See below:       Assessment & Plan:   Problem List Items Addressed This Visit      Cardiovascular and Mediastinum   Hypertension    The current medical regimen is effective;  continue present plan and medications.       Relevant Medications   metoprolol succinate (TOPROL-XL) 50 MG 24 hr tablet     Nervous and Auditory   Peripheral neuropathy (HCC)    stable        Musculoskeletal and Integument   Contact dermatitis    stable        Genitourinary   BPH (benign prostatic hyperplasia)    Urgency but OK      Relevant Orders   PSA     Other   Spinal stenosis at L4-L5 level    Doing well           Follow up plan: Return in about 5 months (around 06/28/2017)  for BMP.

## 2017-01-26 NOTE — Assessment & Plan Note (Signed)
The current medical regimen is effective;  continue present plan and medications.  

## 2017-01-26 NOTE — Assessment & Plan Note (Signed)
Urgency but OK

## 2017-01-27 ENCOUNTER — Encounter: Payer: Self-pay | Admitting: Family Medicine

## 2017-01-27 LAB — PSA: Prostate Specific Ag, Serum: 3.1 ng/mL (ref 0.0–4.0)

## 2017-03-26 ENCOUNTER — Emergency Department: Payer: Medicare Other

## 2017-03-26 ENCOUNTER — Emergency Department
Admission: EM | Admit: 2017-03-26 | Discharge: 2017-03-27 | Disposition: A | Discharge status: Home / Self Care | Payer: Medicare Other | Attending: Emergency Medicine | Admitting: Emergency Medicine

## 2017-03-26 DIAGNOSIS — I10 Essential (primary) hypertension: Secondary | ICD-10-CM | POA: Diagnosis not present

## 2017-03-26 DIAGNOSIS — K5000 Crohn's disease of small intestine without complications: Secondary | ICD-10-CM

## 2017-03-26 DIAGNOSIS — Z7982 Long term (current) use of aspirin: Secondary | ICD-10-CM | POA: Insufficient documentation

## 2017-03-26 DIAGNOSIS — R1084 Generalized abdominal pain: Secondary | ICD-10-CM | POA: Diagnosis not present

## 2017-03-26 DIAGNOSIS — R109 Unspecified abdominal pain: Secondary | ICD-10-CM | POA: Diagnosis not present

## 2017-03-26 DIAGNOSIS — K529 Noninfective gastroenteritis and colitis, unspecified: Secondary | ICD-10-CM | POA: Diagnosis not present

## 2017-03-26 DIAGNOSIS — Z87891 Personal history of nicotine dependence: Secondary | ICD-10-CM | POA: Insufficient documentation

## 2017-03-26 DIAGNOSIS — R1010 Upper abdominal pain, unspecified: Secondary | ICD-10-CM | POA: Diagnosis present

## 2017-03-26 DIAGNOSIS — Z79899 Other long term (current) drug therapy: Secondary | ICD-10-CM | POA: Insufficient documentation

## 2017-03-26 DIAGNOSIS — R112 Nausea with vomiting, unspecified: Secondary | ICD-10-CM | POA: Diagnosis not present

## 2017-03-26 DIAGNOSIS — R1110 Vomiting, unspecified: Secondary | ICD-10-CM | POA: Diagnosis not present

## 2017-03-26 LAB — COMPREHENSIVE METABOLIC PANEL
ALT: 20 U/L (ref 17–63)
AST: 28 U/L (ref 15–41)
Albumin: 3.7 g/dL (ref 3.5–5.0)
Alkaline Phosphatase: 81 U/L (ref 38–126)
Anion gap: 10 (ref 5–15)
BUN: 19 mg/dL (ref 6–20)
CHLORIDE: 96 mmol/L — AB (ref 101–111)
CO2: 28 mmol/L (ref 22–32)
Calcium: 8.9 mg/dL (ref 8.9–10.3)
Creatinine, Ser: 0.94 mg/dL (ref 0.61–1.24)
GFR calc Af Amer: >60 mL/min (ref >60)
Glucose, Bld: 207 mg/dL — ABNORMAL HIGH (ref 65–99)
Potassium: 3.3 mmol/L — ABNORMAL LOW (ref 3.5–5.1)
Sodium: 134 mmol/L — ABNORMAL LOW (ref 135–145)
Total Bilirubin: 0.9 mg/dL (ref 0.3–1.2)
Total Protein: 7.5 g/dL (ref 6.5–8.1)

## 2017-03-26 LAB — CBC
HCT: 50.1 % (ref 40.0–52.0)
Hemoglobin: 17 g/dL (ref 13.0–18.0)
MCH: 31.1 pg (ref 26.0–34.0)
MCHC: 34 g/dL (ref 32.0–36.0)
MCV: 91.5 fL (ref 80.0–100.0)
PLATELETS: 188 10*3/uL (ref 150–440)
RBC: 5.47 MIL/uL (ref 4.40–5.90)
RDW: 13.2 % (ref 11.5–14.5)
WBC: 10.1 10*3/uL (ref 3.8–10.6)

## 2017-03-26 LAB — LIPASE, BLOOD: LIPASE: 20 U/L (ref 11–51)

## 2017-03-26 LAB — TROPONIN I: Troponin I: <0.03 ng/mL (ref <0.03)

## 2017-03-26 MED ORDER — SODIUM CHLORIDE 0.9 % IV BOLUS (SEPSIS)
1000.0000 mL | INTRAVENOUS | Status: AC
  Administered 2017-03-26: 1000 mL via INTRAVENOUS

## 2017-03-26 MED ORDER — IOPAMIDOL (ISOVUE-300) INJECTION 61%
100.0000 mL | Freq: Once | INTRAVENOUS | Status: AC | PRN
Start: 2017-03-26 — End: 2017-03-26
  Administered 2017-03-26: 100 mL via INTRAVENOUS

## 2017-03-26 MED ORDER — IOPAMIDOL (ISOVUE-300) INJECTION 61%
30.0000 mL | Freq: Once | INTRAVENOUS | Status: AC | PRN
Start: 2017-03-26 — End: 2017-03-26
  Administered 2017-03-26: 30 mL via ORAL

## 2017-03-26 MED ORDER — MORPHINE SULFATE (PF) 4 MG/ML IV SOLN
4.0000 mg | Freq: Once | INTRAVENOUS | Status: AC
  Administered 2017-03-26: 4 mg via INTRAVENOUS
  Filled 2017-03-26: qty 1

## 2017-03-26 MED ORDER — ONDANSETRON HCL 4 MG/2ML IJ SOLN
4.0000 mg | INTRAMUSCULAR | Status: AC
  Administered 2017-03-26: 4 mg via INTRAVENOUS
  Filled 2017-03-26: qty 2

## 2017-03-26 NOTE — ED Provider Notes (Signed)
[facility] Emergency Department Provider Note  ____________________________________________   First MD Initiated Contact with Patient 03/26/17 2224     (approximate)  I have reviewed the triage vital signs and the nursing notes.   HISTORY  Chief Complaint Abdominal Pain    HPI Drew Mcmahon is a 71 y.o. male with no medical history that seems related who presents for evaluation of acute onset upper abdominal pain that started last night and has been persistent and gradually getting worse.  The discomfort has been going on for more than 16 hours now and has been accompanied with numerous episodes of vomiting.  The patient has not been able to tolerate any oral intake without vomiting it back up and he reports no bowel movements for about 3 days, but that alone is not unusual for him.  However he states he is also not passed any gas from below today although he has had a lot of belching.  He feels like his abdomen is distended and reports the pain as being moderate to severe and feels like a dull ache and sensation of pressure.  He denies fever/chills, chest pain, shortness of breath.  Reports dysuria but this is a chronic condition for him.   Past Medical History:  Diagnosis Date  . Elevated lipids   . Hip fracture ([place]) 2007  . Hyperlipidemia   . Hypertension     Patient Active Problem List   Diagnosis Date Noted  . Spinal stenosis at L4-L5 level 12/01/2016  . Contact dermatitis 09/10/2015  . BPH (benign prostatic hyperplasia) 08/27/2015  . Peripheral neuropathy 08/27/2015  . Hyperlipidemia   . Hypertension     Past Surgical History:  Procedure Laterality Date  . COLONOSCOPY WITH PROPOFOL N/A 06/12/2016   Procedure: COLONOSCOPY WITH PROPOFOL;  Surgeon: Manya Silvas, MD;  Location: [facility] ENDOSCOPY;  Service: Endoscopy;  Laterality: N/A;  . FRACTURE SURGERY Left 2007   hip    Prior to Admission medications   Medication Sig Start Date End  Date Taking? Authorizing Provider  aspirin (ASPIRIN EC) 81 MG EC tablet Take 81 mg by mouth daily. Swallow whole.    [provider]  losartan-hydrochlorothiazide (HYZAAR) 100-25 MG tablet Take 1 tablet by mouth daily. 12/01/16   Guadalupe Maple, MD  metoprolol succinate (TOPROL-XL) 50 MG 24 hr tablet Take 1 tablet (50 mg total) by mouth daily. Take with or immediately following a meal. 01/26/17   Crissman, Jeannette How, MD  Multiple Vitamins-Minerals (ONE-A-DAY MENS VITACRAVES PO) Take by mouth.    [provider]  Omega-3 Fatty Acids (FISH OIL PO) Take by mouth daily.    [provider]  triamcinolone cream (KENALOG) 0.1 %  07/13/15   [provider]    Allergies Patient has no known allergies.  Family History  Problem Relation Age of Onset  . Hypertension Mother   . Alzheimer's disease Mother   . Hypertension Father   . Cancer Father        lung  . Hypertension Sister   . Hypertension Brother   . Hypertension Sister   . Hypertension Sister   . Hypertension Brother     Social History Social History  Substance Use Topics  . Smoking status: Former Smoker    Types: Cigarettes    Quit date: 08/26/1985  . Smokeless tobacco: Never Used  . Alcohol use No    Review of Systems Constitutional: No fever/chills Eyes: No visual changes. ENT: No sore throat. Cardiovascular: Denies  chest pain. Respiratory: Denies shortness of breath. Gastrointestinal: Generalized aching and pressure like abdominal pain with persistent and numerous episodes of vomiting for more than 16 hours.  Patient has not passed flatus today Genitourinary: Negative for dysuria. Musculoskeletal: Negative for neck pain.  Negative for back pain. Integumentary: Negative for rash. Neurological: Negative for headaches, focal weakness or numbness.   ____________________________________________   PHYSICAL EXAM:  VITAL SIGNS: ED Triage Vitals  Enc Vitals Group     BP 03/26/17 1900  133/69     Pulse Rate 03/26/17 1900 81     Resp 03/26/17 1900 18     Temp 03/26/17 1900 98.4 F (36.9 C)     Temp Source 03/26/17 1900 Oral     SpO2 03/26/17 1900 99 %     Weight 03/26/17 1901 104.3 kg (230 lb)     Height 03/26/17 1901 1.88 m (6\' 2" )     Head Circumference --      Peak Flow --      Pain Score 03/26/17 1900 6     Pain Loc --      Pain Edu? --      Excl. in [place]? --     Constitutional: Alert and oriented. Well appearing and in no acute distress. Eyes: Conjunctivae are normal.  Head: Atraumatic. Nose: No congestion/rhinnorhea. Mouth/Throat: Mucous membranes are moist. Neck: No stridor.  No meningeal signs.   Cardiovascular: Normal rate, regular rhythm. Good peripheral circulation. Grossly normal heart sounds. Respiratory: Normal respiratory effort.  No retractions. Lungs CTAB. Gastrointestinal: Diminished bowel sounds.  Soft with some mild distention.  Tenderness to palpation of the supraumbilical region.  No right upper quadrant tenderness with negative Murphy's sign and no tenderness to palpation at McBurney's point. Musculoskeletal: No lower extremity tenderness nor edema. No gross deformities of extremities. Neurologic:  Normal speech and language. No gross focal neurologic deficits are appreciated.  Skin:  Skin is warm, dry and intact. No rash noted. Psychiatric: Mood and affect are normal. Speech and behavior are normal.  ____________________________________________   LABS (all labs ordered are listed, but only abnormal results are displayed)  Labs Reviewed  COMPREHENSIVE METABOLIC PANEL - Abnormal; Notable for the following:       Result Value   Sodium 134 (*)    Potassium 3.3 (*)    Chloride 96 (*)    Glucose, Bld 207 (*)    All other components within normal limits  LIPASE, BLOOD  CBC  TROPONIN I  URINALYSIS, COMPLETE (UACMP) WITH MICROSCOPIC  URINALYSIS, ROUTINE W REFLEX MICROSCOPIC   ____________________________________________  EKG  ED ECG  REPORT I, Waymond Meador, the attending physician, personally viewed and interpreted this ECG.  Date: 03/26/2017 EKG Time: 19:09 Rate: 82 Rhythm: normal sinus rhythm QRS Axis: normal Intervals: normal with LVH ST/T Wave abnormalities: normal Conduction Disturbances: none Narrative Interpretation: unremarkable  ____________________________________________  RADIOLOGY   No results found.  ____________________________________________   PROCEDURES  Critical Care performed: No   Procedure(s) performed:   Procedures   ____________________________________________   INITIAL IMPRESSION / ASSESSMENT AND PLAN / ED COURSE  Pertinent labs & imaging results that were available during my care of the patient were reviewed by me and considered in my medical decision making (see chart for details).  Signs/symptoms consistent with obstruction vs ileus, although the patient has no history of abdominal surgery, and has not been on any new medications.  He is in moderate discomfort at this time and I will give him a dose of Zofran  and morphine as well as a liter of IV fluids as he has not been able to tolerate any oral intake today.  11:30 PM:  Transferring care to Dr. Owens Shark to follow up on the CT scan results and reassess the patient.      ____________________________________________  FINAL CLINICAL IMPRESSION(S) / ED DIAGNOSES  Final diagnoses:  Generalized abdominal pain  Nausea and vomiting, intractability of vomiting not specified, unspecified vomiting type     MEDICATIONS GIVEN DURING THIS VISIT:  Medications  iopamidol (ISOVUE-300) 61 % injection 100 mL (not administered)  ondansetron (ZOFRAN) injection 4 mg (4 mg Intravenous Given 03/26/17 2250)  morphine 4 MG/ML injection 4 mg (4 mg Intravenous Given 03/26/17 2250)  sodium chloride 0.9 % bolus 1,000 mL (1,000 mLs Intravenous New Bag/Given 03/26/17 2250)  iopamidol (ISOVUE-300) 61 % injection 30 mL (30 mLs Oral Contrast  Given 03/26/17 2253)     NEW OUTPATIENT MEDICATIONS STARTED DURING THIS VISIT:  New Prescriptions   No medications on file    Modified Medications   No medications on file    Discontinued Medications   No medications on file     Note:  This document was prepared using Dragon voice recognition software and may include unintentional dictation errors.    Hinda Kehr, MD 03/26/17 [phone]

## 2017-03-26 NOTE — ED Triage Notes (Signed)
Pt comes from home with c/o upper abdominal pain that started last night. Pt states he vomited this morning, denies nausea. Pt states he hasn't eaten today. Pt appears uncomfortable in triage. Respirations even and unlabored.

## 2017-03-26 NOTE — ED Notes (Signed)
Patient transported to CT 

## 2017-03-27 LAB — URINALYSIS, COMPLETE (UACMP) WITH MICROSCOPIC
BACTERIA UA: NONE SEEN
Bilirubin Urine: NEGATIVE
Glucose, UA: NEGATIVE mg/dL
Hgb urine dipstick: NEGATIVE
Ketones, ur: NEGATIVE mg/dL
Leukocytes, UA: NEGATIVE
Nitrite: NEGATIVE
PROTEIN: NEGATIVE mg/dL
SPECIFIC GRAVITY, URINE: 1.03 (ref 1.005–1.030)
pH: 6 (ref 5.0–8.0)

## 2017-03-27 MED ORDER — ONDANSETRON HCL 4 MG/2ML IJ SOLN
4.0000 mg | Freq: Once | INTRAMUSCULAR | Status: AC
  Administered 2017-03-27: 4 mg via INTRAVENOUS
  Filled 2017-03-27: qty 2

## 2017-03-27 MED ORDER — SIMETHICONE 80 MG PO CHEW: 80.0000 mg | CHEWABLE_TABLET | Freq: Four times a day (QID) | ORAL | 2 refills | Status: DC | PRN

## 2017-03-27 MED ORDER — SIMETHICONE 40 MG/0.6ML PO SUSP (UNIT DOSE)
80.0000 mg | Freq: Once | ORAL | Status: AC
  Administered 2017-03-27: 80 mg via ORAL
  Filled 2017-03-27 (×2): qty 1.2

## 2017-03-27 MED ORDER — ONDANSETRON 4 MG PO TBDP
ORAL_TABLET | ORAL | Status: AC
  Filled 2017-03-27: qty 1

## 2017-03-27 MED ORDER — ONDANSETRON 4 MG PO TBDP: 4.0000 mg | ORAL_TABLET | Freq: Three times a day (TID) | ORAL | 0 refills | Status: DC | PRN

## 2017-03-27 MED ORDER — ONDANSETRON 4 MG PO TBDP
4.0000 mg | ORAL_TABLET | Freq: Once | ORAL | Status: AC
  Administered 2017-03-27: 4 mg via ORAL

## 2017-03-27 NOTE — ED Notes (Addendum)
Pt wheeled to car with wife, verbalizes he does want to go home. Pt hugged and thanked this RN for the help. Pt placed in car without difficulty.

## 2017-03-27 NOTE — ED Notes (Signed)
Upon discharge, pt was wheeled out front with this RN. While waiting for wife to bring the car to entrance pt states "I think I need to throw up." Pt was wheeled back inside to triage 3 and had emesis x2. Pt reports after emesis he felt better. This RN told pt I would check with ED Doctor to let him know, pt verbalized understanding of this.

## 2017-03-27 NOTE — ED Notes (Signed)
EDP notified of pt's episodes of emesis. Verbal order from [place] for ODT zofran and that pt can still be discharged.

## 2017-03-30 ENCOUNTER — Inpatient Hospital Stay: Payer: Medicare Other

## 2017-03-30 ENCOUNTER — Ambulatory Visit (INDEPENDENT_AMBULATORY_CARE_PROVIDER_SITE_OTHER): Payer: Medicare Other | Admitting: Family Medicine

## 2017-03-30 ENCOUNTER — Encounter: Payer: Self-pay | Admitting: Family Medicine

## 2017-03-30 ENCOUNTER — Other Ambulatory Visit: Payer: Self-pay | Admitting: Student

## 2017-03-30 ENCOUNTER — Inpatient Hospital Stay
Admission: AD | Admit: 2017-03-30 | Discharge: 2017-04-14 | DRG: 327 | Disposition: A | Discharge status: Home / Self Care | Payer: Medicare Other | Source: Ambulatory Visit | Attending: General Surgery | Admitting: General Surgery

## 2017-03-30 ENCOUNTER — Ambulatory Visit
Admission: RE | Admit: 2017-03-30 | Discharge: 2017-03-30 | Disposition: A | Discharge status: Home / Self Care | Payer: Medicare Other | Source: Ambulatory Visit | Attending: Student | Admitting: Student

## 2017-03-30 VITALS — BP 149/88 | HR 75 | Wt 228.0 lb

## 2017-03-30 DIAGNOSIS — R100 Acute abdomen: Secondary | ICD-10-CM | POA: Diagnosis not present

## 2017-03-30 DIAGNOSIS — L0390 Cellulitis, unspecified: Secondary | ICD-10-CM | POA: Diagnosis not present

## 2017-03-30 DIAGNOSIS — R110 Nausea: Secondary | ICD-10-CM

## 2017-03-30 DIAGNOSIS — M7981 Nontraumatic hematoma of soft tissue: Secondary | ICD-10-CM | POA: Diagnosis present

## 2017-03-30 DIAGNOSIS — R112 Nausea with vomiting, unspecified: Secondary | ICD-10-CM | POA: Diagnosis not present

## 2017-03-30 DIAGNOSIS — Z4682 Encounter for fitting and adjustment of non-vascular catheter: Secondary | ICD-10-CM | POA: Diagnosis not present

## 2017-03-30 DIAGNOSIS — J9811 Atelectasis: Secondary | ICD-10-CM | POA: Diagnosis not present

## 2017-03-30 DIAGNOSIS — E876 Hypokalemia: Secondary | ICD-10-CM | POA: Diagnosis present

## 2017-03-30 DIAGNOSIS — I10 Essential (primary) hypertension: Secondary | ICD-10-CM | POA: Diagnosis present

## 2017-03-30 DIAGNOSIS — J189 Pneumonia, unspecified organism: Secondary | ICD-10-CM | POA: Diagnosis not present

## 2017-03-30 DIAGNOSIS — K56609 Unspecified intestinal obstruction, unspecified as to partial versus complete obstruction: Secondary | ICD-10-CM

## 2017-03-30 DIAGNOSIS — R42 Dizziness and giddiness: Secondary | ICD-10-CM | POA: Diagnosis not present

## 2017-03-30 DIAGNOSIS — D72829 Elevated white blood cell count, unspecified: Secondary | ICD-10-CM | POA: Diagnosis not present

## 2017-03-30 DIAGNOSIS — K56699 Other intestinal obstruction unspecified as to partial versus complete obstruction: Secondary | ICD-10-CM | POA: Diagnosis not present

## 2017-03-30 DIAGNOSIS — Z820 Family history of epilepsy and other diseases of the nervous system: Secondary | ICD-10-CM | POA: Diagnosis not present

## 2017-03-30 DIAGNOSIS — R339 Retention of urine, unspecified: Secondary | ICD-10-CM

## 2017-03-30 DIAGNOSIS — R338 Other retention of urine: Secondary | ICD-10-CM | POA: Diagnosis not present

## 2017-03-30 DIAGNOSIS — R509 Fever, unspecified: Secondary | ICD-10-CM | POA: Diagnosis not present

## 2017-03-30 DIAGNOSIS — R1110 Vomiting, unspecified: Secondary | ICD-10-CM | POA: Diagnosis not present

## 2017-03-30 DIAGNOSIS — N401 Enlarged prostate with lower urinary tract symptoms: Secondary | ICD-10-CM | POA: Diagnosis present

## 2017-03-30 DIAGNOSIS — Z801 Family history of malignant neoplasm of trachea, bronchus and lung: Secondary | ICD-10-CM | POA: Diagnosis not present

## 2017-03-30 DIAGNOSIS — Z7982 Long term (current) use of aspirin: Secondary | ICD-10-CM

## 2017-03-30 DIAGNOSIS — Z8249 Family history of ischemic heart disease and other diseases of the circulatory system: Secondary | ICD-10-CM

## 2017-03-30 DIAGNOSIS — R1010 Upper abdominal pain, unspecified: Secondary | ICD-10-CM

## 2017-03-30 DIAGNOSIS — G629 Polyneuropathy, unspecified: Secondary | ICD-10-CM | POA: Diagnosis present

## 2017-03-30 DIAGNOSIS — L03311 Cellulitis of abdominal wall: Secondary | ICD-10-CM | POA: Diagnosis not present

## 2017-03-30 DIAGNOSIS — N390 Urinary tract infection, site not specified: Secondary | ICD-10-CM | POA: Diagnosis not present

## 2017-03-30 DIAGNOSIS — B952 Enterococcus as the cause of diseases classified elsewhere: Secondary | ICD-10-CM | POA: Diagnosis not present

## 2017-03-30 DIAGNOSIS — Z87891 Personal history of nicotine dependence: Secondary | ICD-10-CM | POA: Diagnosis not present

## 2017-03-30 DIAGNOSIS — Z6829 Body mass index (BMI) 29.0-29.9, adult: Secondary | ICD-10-CM | POA: Diagnosis not present

## 2017-03-30 DIAGNOSIS — R933 Abnormal findings on diagnostic imaging of other parts of digestive tract: Secondary | ICD-10-CM | POA: Insufficient documentation

## 2017-03-30 DIAGNOSIS — E440 Moderate protein-calorie malnutrition: Secondary | ICD-10-CM | POA: Diagnosis present

## 2017-03-30 DIAGNOSIS — Z8719 Personal history of other diseases of the digestive system: Secondary | ICD-10-CM | POA: Diagnosis not present

## 2017-03-30 DIAGNOSIS — E785 Hyperlipidemia, unspecified: Secondary | ICD-10-CM | POA: Diagnosis present

## 2017-03-30 DIAGNOSIS — Z4659 Encounter for fitting and adjustment of other gastrointestinal appliance and device: Secondary | ICD-10-CM

## 2017-03-30 DIAGNOSIS — K660 Peritoneal adhesions (postprocedural) (postinfection): Secondary | ICD-10-CM | POA: Diagnosis present

## 2017-03-30 DIAGNOSIS — K56600 Partial intestinal obstruction, unspecified as to cause: Secondary | ICD-10-CM | POA: Diagnosis not present

## 2017-03-30 DIAGNOSIS — K315 Obstruction of duodenum: Secondary | ICD-10-CM | POA: Diagnosis not present

## 2017-03-30 LAB — BASIC METABOLIC PANEL
Anion gap: 11 (ref 5–15)
BUN: 15 mg/dL (ref 6–20)
CO2: 29 mmol/L (ref 22–32)
Calcium: 9 mg/dL (ref 8.9–10.3)
Chloride: 97 mmol/L — ABNORMAL LOW (ref 101–111)
Creatinine, Ser: 0.98 mg/dL (ref 0.61–1.24)
Glucose, Bld: 128 mg/dL — ABNORMAL HIGH (ref 65–99)
POTASSIUM: 3.5 mmol/L (ref 3.5–5.1)
SODIUM: 137 mmol/L (ref 135–145)

## 2017-03-30 MED ORDER — IOPAMIDOL (ISOVUE-300) INJECTION 61%
100.0000 mL | Freq: Once | INTRAVENOUS | Status: AC | PRN
  Administered 2017-03-30: 100 mL via INTRAVENOUS

## 2017-03-30 MED ORDER — KCL IN DEXTROSE-NACL 40-5-0.9 MEQ/L-%-% IV SOLN
INTRAVENOUS | Status: DC
  Administered 2017-03-30 – 2017-04-02 (×6): via INTRAVENOUS
  Filled 2017-03-30 (×10): qty 1000

## 2017-03-30 MED ORDER — METOPROLOL SUCCINATE ER 50 MG PO TB24
50.0000 mg | ORAL_TABLET | Freq: Every day | ORAL | Status: DC
  Administered 2017-03-30 – 2017-04-13 (×14): 50 mg via ORAL
  Filled 2017-03-30 (×16): qty 1

## 2017-03-30 MED ORDER — MORPHINE SULFATE (PF) 2 MG/ML IV SOLN
2.0000 mg | INTRAVENOUS | Status: DC | PRN
  Administered 2017-04-02: 2 mg via INTRAVENOUS
  Filled 2017-03-30: qty 1

## 2017-03-30 MED ORDER — BUTAMBEN-TETRACAINE-BENZOCAINE 2-2-14 % EX AERO
1.0000 | INHALATION_SPRAY | Freq: Once | CUTANEOUS | Status: AC
  Administered 2017-03-30: 1 via TOPICAL
  Filled 2017-03-30 (×2): qty 20

## 2017-03-30 MED ORDER — PROMETHAZINE HCL 25 MG/ML IJ SOLN: 12.5000 mg | Freq: Four times a day (QID) | INTRAMUSCULAR | Status: DC | PRN

## 2017-03-30 MED ORDER — BENZOCAINE (TOPICAL) 20 % EX AERO: INHALATION_SPRAY | Freq: Four times a day (QID) | CUTANEOUS | Status: DC | PRN

## 2017-03-30 MED ORDER — BENZOCAINE (TOPICAL) 20 % EX AERO: INHALATION_SPRAY | CUTANEOUS | Status: DC | PRN

## 2017-03-30 MED ORDER — PHENOL 1.4 % MT LIQD
1.0000 | OROMUCOSAL | Status: DC | PRN
  Administered 2017-03-30: 1 via OROMUCOSAL
  Filled 2017-03-30: qty 177

## 2017-03-30 NOTE — Progress Notes (Signed)
BP (!) 149/88   Pulse 75   Wt 228 lb (103.4 kg)   SpO2 97%   BMI 29.27 kg/m    Subjective:    Patient ID: Drew Mcmahon, male    DOB: 07-Sep-1946, 71 y.o.   MRN: [id number]  HPI: HILLMAN ATTIG is a 71 y.o. male  Chief Complaint  Patient presents with  . Hospitalization Follow-up    Started on Wednesday w/ nausea, vomitted on Thursday. Abdomal cramping.    Patient presents with 5 day hx of abdominal pain, nausea and vomiting. Last BM was 5 days ago. Passing urine every 4-6 hours regularly. Vomiting every time he eats something, states he will have cramping for several hours and then vomit.   Presented to ER 5 days ago with these sxs with fairly normal workup other than small bowel inflammation without obstruction seen on CT scan. Given zofran and simethicone with no relief. Has not tolerated PO solids since onset, able to keep minimal liquids. Barely able to pass gas. Low grade fevers the past few days.   Past Medical History:  Diagnosis Date  . Elevated lipids   . Hip fracture ([place]) 2007  . Hyperlipidemia   . Hypertension    Social History   Social History  . Marital status: Married    Spouse name: N/A  . Number of children: N/A  . Years of education: N/A   Occupational History  . Not on file.   Social History Main Topics  . Smoking status: Former Smoker    Types: Cigarettes    Quit date: 08/26/1985  . Smokeless tobacco: Never Used  . Alcohol use No  . Drug use: No  . Sexual activity: Not on file   Other Topics Concern  . Not on file   Social History Narrative  . No narrative on file    Relevant past medical, surgical, family and social history reviewed and updated as indicated. Interim medical history since our last visit reviewed. Allergies and medications reviewed and updated.  Review of Systems  Constitutional: Positive for fatigue and fever.  HENT: Negative.   Respiratory: Negative.   Cardiovascular: Negative.   Gastrointestinal: Positive for  abdominal pain, constipation, nausea and vomiting.  Genitourinary: Negative.   Musculoskeletal: Negative.   Neurological: Negative.   Psychiatric/Behavioral: Negative.    Per HPI unless specifically indicated above     Objective:    BP (!) 149/88   Pulse 75   Wt 228 lb (103.4 kg)   SpO2 97%   BMI 29.27 kg/m   Wt Readings from Last 3 Encounters:  04/01/17 222 lb 14.4 oz (101.1 kg)  03/30/17 228 lb (103.4 kg)  03/26/17 230 lb (104.3 kg)    Physical Exam  Constitutional: He is oriented to person, place, and time. He appears well-developed and well-nourished. No distress.  HENT:  Head: Atraumatic.  Eyes: Conjunctivae are normal. Pupils are equal, round, and reactive to light. No scleral icterus.  Neck: Normal range of motion. Neck supple.  Cardiovascular: Normal rate and normal heart sounds.   Pulmonary/Chest: Effort normal and breath sounds normal. No respiratory distress.  Abdominal: There is tenderness (Diffuse). There is guarding.  Hypoactive BS  Musculoskeletal: Normal range of motion.  Neurological: He is alert and oriented to person, place, and time.  Skin: Skin is warm and dry.  Psychiatric: He has a normal mood and affect. His behavior is normal.  Nursing note and vitals reviewed.   Results for orders placed or performed in  visit on 03/30/17  Microscopic Examination  Result Value Ref Range   WBC, UA 0-5 0 - 5 /hpf   RBC, UA 0-2 0 - 2 /hpf   Epithelial Cells (non renal) 0-10 0 - 10 /hpf   Bacteria, UA None seen None seen/Few  CBC With Differential/Platelet  Result Value Ref Range   WBC 7.7 3.4 - 10.8 x10E3/uL   RBC 5.52 4.14 - 5.80 x10E6/uL   Hemoglobin 16.8 13.0 - 17.7 g/dL   Hematocrit 50.7 37.5 - 51.0 %   MCV 92 79 - 97 fL   MCH 30.4 26.6 - 33.0 pg   MCHC 33.1 31.5 - 35.7 g/dL   RDW 13.0 12.3 - 15.4 %   Platelets 209 150 - 379 x10E3/uL   Neutrophils 60 Not Estab. %   Lymphs 29 Not Estab. %   MID 10 Not Estab. %   Neutrophils Absolute 4.6 1.4 - 7.0  x10E3/uL   Lymphocytes Absolute 2.3 0.7 - 3.1 x10E3/uL   MID (Absolute) 0.8 0.1 - 1.6 X10E3/uL  UA/M w/rflx Culture, Routine  Result Value Ref Range   Specific Gravity, UA 1.015 1.005 - 1.030   pH, UA 6.5 5.0 - 7.5   Color, UA Yellow Yellow   Appearance Ur Cloudy (A) Clear   Leukocytes, UA 1+ (A) Negative   Protein, UA 1+ (A) Negative/Trace   Glucose, UA Trace (A) Negative   Ketones, UA Trace (A) Negative   RBC, UA Trace (A) Negative   Bilirubin, UA Negative Negative   Urobilinogen, Ur >8.0 (H) 0.2 - 1.0 mg/dL   Nitrite, UA Negative Negative   Microscopic Examination See below:    Urinalysis Reflex Comment   Urine Culture, Routine  Result Value Ref Range   Urine Culture, Routine Final report    Organism ID, Bacteria Comment       Assessment & Plan:   Problem List Items Addressed This Visit    None    Visit Diagnoses    Nausea and vomiting, intractability of vomiting not specified, unspecified vomiting type    -  Primary   Relevant Orders   CBC With Differential/Platelet (Completed)   UA/M w/rflx Culture, Routine (Completed)    History and exam concerning, though prior CT showed only small bowel inflammation. Strongly advised pt to return to the ER for stat workup and at minimum IV hydration given his inability to tolerate PO intake x 5 days. Pt adamant against going back to ER. [facility] called for urgent appt, will see him right away this morning. Pt instructed to head there now.    Follow up plan: Return for Urgent GI appt.

## 2017-03-30 NOTE — H&P (Signed)
Drew Mcmahon is an 71 y.o. male.   Chief Complaint: Abdominal pain, nausea and vomiting. HPI: The patient was in his usual health until Wednesday, 03/25/2017. That afternoon he ate an early dinner as he had missed lunch. Shortly afterward he felt bloated and a pressure sensation in the abdomen but no reported pain. He retired early. The morning of 03/26/2017 was notable for single episode of vomiting of undigested food with resolution of his abdominal discomfort. Over the course of several hours he developed abdominal pain and recurrent bloating and was seen in the emergency department. CT scan obtained at that time suggested possible ileitis. He was subsequently discharged home. Over the course of June 1-June 4 he has had daily episodes of vomiting, bloating and abdominal pain. He was seen by his primary care physician this morning who referred him to the GI service who repeated a CT showing progressive small bowel dilatation with distal decompression and subsequent referral to general surgery for evaluation.  The patient's past medical history is unremarkable in regards to abdominal symptoms. No previous abdominal surgery.  He is active with no active cardiopulmonary symptoms.  He reports that 6 months ago metoprolol was added to his BP regimen.  Past Medical History:  Diagnosis Date  . Elevated lipids   . Hip fracture ([place]) 2007  . Hyperlipidemia   . Hypertension     Past Surgical History:  Procedure Laterality Date  . COLONOSCOPY WITH PROPOFOL N/A 06/12/2016   Procedure: COLONOSCOPY WITH PROPOFOL;  Surgeon: Manya Silvas, MD;  Location: [facility] ENDOSCOPY;  Service: Endoscopy;  Laterality: N/A;  . FRACTURE SURGERY Left 2007   hip    Family History  Problem Relation Age of Onset  . Hypertension Mother   . Alzheimer's disease Mother   . Hypertension Father   . Cancer Father        lung  . Hypertension Sister   . Hypertension Brother   . Hypertension Sister   . Hypertension  Sister   . Hypertension Brother    Social History:  reports that he quit smoking about 31 years ago. His smoking use included Cigarettes. He has never used smokeless tobacco. He reports that he does not drink alcohol or use drugs.  Allergies: No Known Allergies  Medications Prior to Admission  Medication Sig Dispense Refill  . aspirin (ASPIRIN EC) 81 MG EC tablet Take 81 mg by mouth daily. Swallow whole.    . losartan-hydrochlorothiazide (HYZAAR) 100-25 MG tablet Take 1 tablet by mouth daily. 90 tablet 4  . metoprolol succinate (TOPROL-XL) 50 MG 24 hr tablet Take 1 tablet (50 mg total) by mouth daily. Take with or immediately following a meal. 90 tablet 4  . Multiple Vitamins-Minerals (ONE-A-DAY MENS VITACRAVES PO) Take by mouth.    . Omega-3 Fatty Acids (FISH OIL PO) Take by mouth daily.    . ondansetron (ZOFRAN ODT) 4 MG disintegrating tablet Take 1 tablet (4 mg total) by mouth every 8 (eight) hours as needed for nausea or vomiting. (Patient not taking: Reported on 03/30/2017) 20 tablet 0  . simethicone (GAS-X) 80 MG chewable tablet Chew 1 tablet (80 mg total) by mouth 4 (four) times daily as needed (bloating). 100 tablet 2  . triamcinolone cream (KENALOG) 0.1 %       Results for orders placed or performed during the hospital encounter of 03/30/17 (from the past 48 hour(s))  Basic metabolic panel     Status: Abnormal   Collection Time: 03/30/17  5:24 PM  Result Value Ref Range   Sodium 137 135 - 145 mmol/L   Potassium 3.5 3.5 - 5.1 mmol/L   Chloride 97 (L) 101 - 111 mmol/L   CO2 29 22 - 32 mmol/L   Glucose, Bld 128 (H) 65 - 99 mg/dL   BUN 15 6 - 20 mg/dL   Creatinine, Ser 0.98 0.61 - 1.24 mg/dL   Calcium 9.0 8.9 - 10.3 mg/dL   GFR calc non Af Amer >60 >60 mL/min   GFR calc Af Amer >60 >60 mL/min    Comment: (NOTE) The eGFR has been calculated using the CKD EPI equation. This calculation has not been validated in all clinical situations. eGFR's persistently <60 mL/min signify  possible Chronic Kidney Disease.    Anion gap 11 5 - 15   Dg Abd 1 View  Result Date: 03/30/2017 CLINICAL DATA:  Nasogastric tube placement EXAM: ABDOMEN - 1 VIEW COMPARISON:  03/30/2017 earlier study FINDINGS: Tip of nasogastric tube projects over the proximal stomach. Dilated small bowel loops with scattered minimal gas in colon again seen. IMPRESSION: Tip of nasogastric tube projects over the proximal stomach. Electronically Signed   By: Lavonia Dana M.D.   On: 03/30/2017 17:51   Abd 1 View (kub)  Result Date: 03/30/2017 CLINICAL DATA:  Small bowel obstruction EXAM: ABDOMEN - 1 VIEW COMPARISON:  CT abdomen and pelvis 03/30/2017 FINDINGS: Dilatation of small bowel loops in the mid and upper abdomen up to 6.1 cm transverse consistent with small bowel obstruction. Some gas is present within the ascending and transverse colon with gas and stool in the descending colon and sigmoid colon. No definite bowel wall thickening. Excreted contrast material within renal collecting systems and urinary bladder with indentation of the bladder base by an enlarged prostate gland. Bones demineralized with degenerative changes of the spine and evidence of prior proximal LEFT hip pinning. IMPRESSION: Small bowel obstruction. Electronically Signed   By: Lavonia Dana M.D.   On: 03/30/2017 17:04   Ct Entero Abd/pelvis W Contast  Result Date: 03/30/2017 CLINICAL DATA:  Abdominal cramping, vomiting x1 week EXAM: CT ABDOMEN AND PELVIS WITH CONTRAST (ENTEROGRAPHY) TECHNIQUE: Multidetector CT of the abdomen and pelvis during bolus administration of intravenous contrast. Negative oral contrast was given. CONTRAST:  132m ISOVUE-300 IOPAMIDOL (ISOVUE-300) INJECTION 61% COMPARISON:  03/26/2017 FINDINGS: Lower chest: Mild linear scarring/ atelectasis in the lingula and left lower lobe. Hepatobiliary: The liver is within normal limits. Gallbladder is unremarkable. No intrahepatic or extrahepatic ductal dilatation. Pancreas: Within normal  limits. Spleen: Within normal limits. Adrenals/Urinary Tract: Adrenal glands are within normal limits. 7 mm posterior left lower pole renal cyst (series 7/ image 44). Bilateral renal sinus cysts. No hydronephrosis. Bladder is within normal limits. Stomach/Bowel: Stomach is within normal limits. Multiple dilated loops of small bowel in the left mid abdomen, measuring up to 5.1 cm (series 2/ image 24). Distal small bowel in the right lower quadrant is decompressed (series 2/image 62). Although somewhat gradual tapering is present, a relative transition is favored along the anterior mid abdomen (coronal image 17), suggesting small bowel obstruction. Small duodenal diverticulum (series 2/ image 347. Normal appendix (series 2/image 65). Mild left colonic diverticulosis, without evidence of diverticulitis. Vascular/Lymphatic: No evidence of abdominal aortic aneurysm. Atherosclerotic calcifications of the abdominal aorta and branch vessels. No suspicious abdominopelvic lymphadenopathy. Reproductive: Prostatomegaly, with enlargement of the central gland indenting the base bladder. Other: Trace right pelvic ascites (series 2/image 94). No pneumatosis or free air. Musculoskeletal: Mild degenerative changes of the visualized thoracolumbar  spine. IMPRESSION: Dilated loops of small bowel in the left mid abdomen, with suspected relative transition along the anterior mid abdomen, suggesting small bowel obstruction. No pneumatosis or free air. These results will be called to the ordering clinician or representative by the Radiologist Assistant, and communication documented in the PACS or zVision Dashboard. Electronically Signed   By: Julian Hy M.D.   On: 03/30/2017 14:09    ROS  Blood pressure (!) 152/92, pulse (!) 106, temperature 98.4 F (36.9 C), temperature source Oral, resp. rate 15, height _0  (1.854 m), weight 230 lb (104.3 kg), SpO2 98 %. Physical Exam  Constitutional: He appears well-developed and  well-nourished.  HENT:  Head: Normocephalic.  Eyes: Pupils are equal, round, and reactive to light.  Neck: No thyromegaly present.  Cardiovascular: Normal rate and regular rhythm.   Pulses:      Femoral pulses are 2+ on the right side, and 2+ on the left side.      Dorsalis pedis pulses are 2+ on the right side, and 2+ on the left side.       Posterior tibial pulses are 2+ on the right side, and 2+ on the left side.  No peripheral edema noted.  Respiratory: Effort normal and breath sounds normal.  GI: Soft. He exhibits no distension. There is no tenderness. There is no rebound and no guarding. Hernia confirmed negative in the right inguinal area and confirmed negative in the left inguinal area.     Assessment/Plan CT scan obtained earlier today as well as the make 31, 2018 exams have been reviewed. No clear etiology for his present condition.  We'll place a nasogastric tube, replenish his potassium and supplemental IV fluids today. We'll obtain repeat films tomorrow. The patient may well come to laparoscopy/laparotomy to help determine the source for his small bowel obstruction (partial this time).  [name], MD 03/30/2017, 9:04 PM

## 2017-03-30 NOTE — Progress Notes (Signed)
Brief note:  71 -year-old male well until 03/25/2017 when he developed abdominal pressure and nausea. Vomiting developed the following day. He was seen in the emergency department and discharged home after CT suggested small bowel obstruction. The patient has had persistent bloating and intermittent vomiting since that time. He's had progressive pain. Everted chills.  No previous episodes.  No previous abdominal surgery.  Abdominal examination shows mild distention. Normal bowel sounds. Chest was clear. Cardiac exam showed regular rhythm.  CT scan small bowel obstruction with decompressed bowel distally. No clear etiology.  The patient will be hydrated intravenously, hypokalemia as noted on 03/26/2017 will be corrected and will follow serial films.   The patient may benefit from laparoscopy/laparotomy.

## 2017-03-31 ENCOUNTER — Inpatient Hospital Stay: Payer: Medicare Other

## 2017-03-31 NOTE — Progress Notes (Signed)
AVSS. Lungs: Clear. Cardio: RR. Extrem: Soft. ABD: Mild distension, soft, non-tender. BS+. Reports passing flatus and BM. Plain films: Clearing of colonic gas. Less distended proximal small bowel loops. Primarily in the left upper quadrant. IMP: PSBO. Plan: Gastrograffin SBFT to assess for complete obstruction that would warrant early surgical intervention.

## 2017-03-31 NOTE — Consult Note (Signed)
Patient with some abd distention but no tightness, bowel sound present, no palpable significant tenderness. Appreciate Dr. Festus Aloe care.  Pt with NG tube. Await i  further studies.  VSS temp OK.

## 2017-04-01 ENCOUNTER — Encounter
Admission: AD | Disposition: A | Discharge status: Home / Self Care | Payer: Self-pay | Source: Ambulatory Visit | Attending: General Surgery

## 2017-04-01 ENCOUNTER — Inpatient Hospital Stay: Payer: Medicare Other | Admitting: Anesthesiology

## 2017-04-01 ENCOUNTER — Inpatient Hospital Stay: Payer: Medicare Other

## 2017-04-01 HISTORY — PX: LAPAROSCOPY: SHX197

## 2017-04-01 LAB — UA/M W/RFLX CULTURE, ROUTINE
Bilirubin, UA: NEGATIVE
Nitrite, UA: NEGATIVE
PH UA: 6.5 (ref 5.0–7.5)
Specific Gravity, UA: 1.015 (ref 1.005–1.030)

## 2017-04-01 LAB — CBC WITH DIFFERENTIAL/PLATELET
Hematocrit: 50.7 % (ref 37.5–51.0)
Hemoglobin: 16.8 g/dL (ref 13.0–17.7)
Lymphocytes Absolute: 2.3 10*3/uL (ref 0.7–3.1)
Lymphs: 29 %
MCH: 30.4 pg (ref 26.6–33.0)
MCHC: 33.1 g/dL (ref 31.5–35.7)
MCV: 92 fL (ref 79–97)
MID (ABSOLUTE): 0.8 10*3/uL (ref 0.1–1.6)
MID: 10 %
NEUTROS PCT: 60 %
Neutrophils Absolute: 4.6 10*3/uL (ref 1.4–7.0)
PLATELETS: 209 10*3/uL (ref 150–379)
RBC: 5.52 x10E6/uL (ref 4.14–5.80)
RDW: 13 % (ref 12.3–15.4)
WBC: 7.7 10*3/uL (ref 3.4–10.8)

## 2017-04-01 LAB — URINE CULTURE, REFLEX

## 2017-04-01 LAB — CBC
HCT: 41.9 % (ref 40.0–52.0)
Hemoglobin: 14.4 g/dL (ref 13.0–18.0)
MCH: 31.5 pg (ref 26.0–34.0)
MCHC: 34.2 g/dL (ref 32.0–36.0)
MCV: 91.9 fL (ref 80.0–100.0)
PLATELETS: 215 10*3/uL (ref 150–440)
RBC: 4.56 MIL/uL (ref 4.40–5.90)
RDW: 13.1 % (ref 11.5–14.5)
WBC: 13.1 10*3/uL — ABNORMAL HIGH (ref 3.8–10.6)

## 2017-04-01 LAB — MICROSCOPIC EXAMINATION: BACTERIA UA: NONE SEEN

## 2017-04-01 LAB — CREATININE, SERUM
CREATININE: 0.92 mg/dL (ref 0.61–1.24)
GFR calc Af Amer: >60 mL/min (ref >60)
GFR calc non Af Amer: >60 mL/min (ref >60)

## 2017-04-01 SURGERY — LAPAROSCOPY, DIAGNOSTIC
Anesthesia: General | Site: Abdomen | Wound class: Clean

## 2017-04-01 MED ORDER — PHENYLEPHRINE HCL 10 MG/ML IJ SOLN
INTRAMUSCULAR | Status: DC | PRN
  Administered 2017-04-01 (×2): 100 ug via INTRAVENOUS

## 2017-04-01 MED ORDER — MIDAZOLAM HCL 2 MG/2ML IJ SOLN
INTRAMUSCULAR | Status: DC | PRN
  Administered 2017-04-01: 2 mg via INTRAVENOUS

## 2017-04-01 MED ORDER — HYDROMORPHONE HCL 1 MG/ML IJ SOLN
INTRAMUSCULAR | Status: AC
  Filled 2017-04-01: qty 1

## 2017-04-01 MED ORDER — ACETAMINOPHEN 10 MG/ML IV SOLN
INTRAVENOUS | Status: DC | PRN
  Administered 2017-04-01: 1000 mg via INTRAVENOUS

## 2017-04-01 MED ORDER — ENOXAPARIN SODIUM 40 MG/0.4ML ~~LOC~~ SOLN
40.0000 mg | SUBCUTANEOUS | Status: DC
  Administered 2017-04-02 – 2017-04-14 (×13): 40 mg via SUBCUTANEOUS
  Filled 2017-04-01 (×13): qty 0.4

## 2017-04-01 MED ORDER — ACETAMINOPHEN 650 MG RE SUPP: 650.0000 mg | Freq: Four times a day (QID) | RECTAL | Status: DC | PRN

## 2017-04-01 MED ORDER — HYDROMORPHONE HCL 1 MG/ML IJ SOLN
0.2500 mg | INTRAMUSCULAR | Status: DC | PRN
  Administered 2017-04-01: 0.5 mg via INTRAVENOUS

## 2017-04-01 MED ORDER — KETOROLAC TROMETHAMINE 15 MG/ML IJ SOLN
15.0000 mg | Freq: Three times a day (TID) | INTRAMUSCULAR | Status: DC
  Administered 2017-04-02 – 2017-04-03 (×4): 15 mg via INTRAVENOUS
  Filled 2017-04-01 (×4): qty 1

## 2017-04-01 MED ORDER — FENTANYL CITRATE (PF) 100 MCG/2ML IJ SOLN: 25.0000 ug | INTRAMUSCULAR | Status: DC | PRN

## 2017-04-01 MED ORDER — ROCURONIUM BROMIDE 50 MG/5ML IV SOLN
INTRAVENOUS | Status: AC
  Filled 2017-04-01: qty 1

## 2017-04-01 MED ORDER — PROPOFOL 10 MG/ML IV BOLUS
INTRAVENOUS | Status: AC
  Filled 2017-04-01: qty 20

## 2017-04-01 MED ORDER — LIDOCAINE HCL (CARDIAC) 20 MG/ML IV SOLN
INTRAVENOUS | Status: DC | PRN
  Administered 2017-04-01: 100 mg via INTRAVENOUS

## 2017-04-01 MED ORDER — LACTATED RINGERS IV SOLN
INTRAVENOUS | Status: DC
  Administered 2017-04-01 – 2017-04-03 (×4): via INTRAVENOUS

## 2017-04-01 MED ORDER — ROCURONIUM BROMIDE 100 MG/10ML IV SOLN
INTRAVENOUS | Status: DC | PRN
  Administered 2017-04-01: 50 mg via INTRAVENOUS
  Administered 2017-04-01: 20 mg via INTRAVENOUS
  Administered 2017-04-01: 30 mg via INTRAVENOUS

## 2017-04-01 MED ORDER — ONDANSETRON HCL 4 MG/2ML IJ SOLN
INTRAMUSCULAR | Status: AC
  Filled 2017-04-01: qty 2

## 2017-04-01 MED ORDER — FENTANYL CITRATE (PF) 100 MCG/2ML IJ SOLN
INTRAMUSCULAR | Status: DC | PRN
  Administered 2017-04-01 (×3): 50 ug via INTRAVENOUS
  Administered 2017-04-01: 100 ug via INTRAVENOUS
  Administered 2017-04-01: 50 ug via INTRAVENOUS

## 2017-04-01 MED ORDER — FENTANYL CITRATE (PF) 100 MCG/2ML IJ SOLN
INTRAMUSCULAR | Status: AC
  Filled 2017-04-01: qty 2

## 2017-04-01 MED ORDER — ACETAMINOPHEN 325 MG PO TABS: 650.0000 mg | ORAL_TABLET | Freq: Four times a day (QID) | ORAL | Status: DC | PRN

## 2017-04-01 MED ORDER — LIDOCAINE HCL (PF) 2 % IJ SOLN
INTRAMUSCULAR | Status: AC
  Filled 2017-04-01: qty 2

## 2017-04-01 MED ORDER — SUGAMMADEX SODIUM 200 MG/2ML IV SOLN
INTRAVENOUS | Status: DC | PRN
  Administered 2017-04-01: 404.4 mg via INTRAVENOUS

## 2017-04-01 MED ORDER — PROMETHAZINE HCL 25 MG/ML IJ SOLN
6.2500 mg | INTRAMUSCULAR | Status: DC | PRN
  Administered 2017-04-01: 12.5 mg via INTRAVENOUS

## 2017-04-01 MED ORDER — DEXAMETHASONE SODIUM PHOSPHATE 10 MG/ML IJ SOLN
INTRAMUSCULAR | Status: AC
  Filled 2017-04-01: qty 1

## 2017-04-01 MED ORDER — MEPERIDINE HCL 50 MG/ML IJ SOLN: 6.2500 mg | INTRAMUSCULAR | Status: DC | PRN

## 2017-04-01 MED ORDER — BUPIVACAINE-EPINEPHRINE (PF) 0.5% -1:200000 IJ SOLN
INTRAMUSCULAR | Status: DC | PRN
  Administered 2017-04-01: 20 mL via PERINEURAL

## 2017-04-01 MED ORDER — PROPOFOL 10 MG/ML IV BOLUS
INTRAVENOUS | Status: DC | PRN
  Administered 2017-04-01: 100 mg via INTRAVENOUS

## 2017-04-01 MED ORDER — ONDANSETRON HCL 4 MG/2ML IJ SOLN
INTRAMUSCULAR | Status: DC | PRN
  Administered 2017-04-01: 4 mg via INTRAVENOUS

## 2017-04-01 MED ORDER — SUCCINYLCHOLINE CHLORIDE 20 MG/ML IJ SOLN
INTRAMUSCULAR | Status: DC | PRN
  Administered 2017-04-01: 100 mg via INTRAVENOUS

## 2017-04-01 MED ORDER — ACETAMINOPHEN 10 MG/ML IV SOLN
INTRAVENOUS | Status: AC
  Filled 2017-04-01: qty 100

## 2017-04-01 MED ORDER — PROMETHAZINE HCL 25 MG/ML IJ SOLN
INTRAMUSCULAR | Status: AC
  Filled 2017-04-01: qty 1

## 2017-04-01 MED ORDER — LACTATED RINGERS IV SOLN
INTRAVENOUS | Status: DC | PRN
  Administered 2017-04-01 (×2): via INTRAVENOUS

## 2017-04-01 MED ORDER — FENTANYL CITRATE (PF) 250 MCG/5ML IJ SOLN
INTRAMUSCULAR | Status: AC
  Filled 2017-04-01: qty 5

## 2017-04-01 MED ORDER — DEXAMETHASONE SODIUM PHOSPHATE 10 MG/ML IJ SOLN
INTRAMUSCULAR | Status: DC | PRN
  Administered 2017-04-01: 5 mg via INTRAVENOUS

## 2017-04-01 MED ORDER — SUCCINYLCHOLINE CHLORIDE 20 MG/ML IJ SOLN
INTRAMUSCULAR | Status: AC
  Filled 2017-04-01: qty 1

## 2017-04-01 MED ORDER — MIDAZOLAM HCL 2 MG/2ML IJ SOLN
INTRAMUSCULAR | Status: AC
  Filled 2017-04-01: qty 2

## 2017-04-01 SURGICAL SUPPLY — 56 items
BAG COUNTER SPONGE EZ (MISCELLANEOUS) IMPLANT
BENZOIN TINCTURE PRP APPL 2/3 (GAUZE/BANDAGES/DRESSINGS) ×4 IMPLANT
BLADE SURG 11 STRL SS SAFETY (MISCELLANEOUS) ×4 IMPLANT
BULB RESERV EVAC DRAIN JP 100C (MISCELLANEOUS) ×4 IMPLANT
CANISTER SUCT 1200ML W/VALVE (MISCELLANEOUS) ×4 IMPLANT
CANNULA DILATOR  5MM W/SLV (CANNULA) ×1
CANNULA DILATOR 10 W/SLV (CANNULA) ×3 IMPLANT
CANNULA DILATOR 10MM W/SLV (CANNULA) ×1
CANNULA DILATOR 5 W/SLV (CANNULA) ×3 IMPLANT
CATH TRAY 16F METER LATEX (MISCELLANEOUS) ×4 IMPLANT
CHLORAPREP W/TINT 26ML (MISCELLANEOUS) ×4 IMPLANT
CLOSURE WOUND 1/2 X4 (GAUZE/BANDAGES/DRESSINGS) ×1
COUNTER SPONGE BAG EZ (MISCELLANEOUS)
COVER CLAMP SIL LG PBX B (MISCELLANEOUS) ×4 IMPLANT
DRAIN CHANNEL JP 15F RND 16 (MISCELLANEOUS) ×4 IMPLANT
DRAPE LAPAROTOMY 100X77 ABD (DRAPES) ×4 IMPLANT
DRSG TEGADERM 2-3/8X2-3/4 SM (GAUZE/BANDAGES/DRESSINGS) ×12 IMPLANT
DRSG TEGADERM 4X4.75 (GAUZE/BANDAGES/DRESSINGS) ×4 IMPLANT
DRSG TELFA 3X8 NADH (GAUZE/BANDAGES/DRESSINGS) ×4 IMPLANT
DRSG TELFA 4X3 1S NADH ST (GAUZE/BANDAGES/DRESSINGS) ×4 IMPLANT
ELECT BLADE 6 FLAT ULTRCLN (ELECTRODE) ×4 IMPLANT
ELECT REM PT RETURN 9FT ADLT (ELECTROSURGICAL) ×4
ELECTRODE REM PT RTRN 9FT ADLT (ELECTROSURGICAL) ×2 IMPLANT
GAUZE SPONGE 4X4 12PLY STRL (GAUZE/BANDAGES/DRESSINGS) ×4 IMPLANT
GLOVE BIO SURGEON STRL SZ7.5 (GLOVE) ×4 IMPLANT
GLOVE INDICATOR 8.0 STRL GRN (GLOVE) ×4 IMPLANT
GOWN STRL REUS W/ TWL LRG LVL3 (GOWN DISPOSABLE) ×4 IMPLANT
GOWN STRL REUS W/TWL LRG LVL3 (GOWN DISPOSABLE) ×4
GRASPER SUT TROCAR 14GX15 (MISCELLANEOUS) ×4 IMPLANT
HOLDER FOLEY CATH W/STRAP (MISCELLANEOUS) ×4 IMPLANT
KIT RM TURNOVER STRD PROC AR (KITS) ×4 IMPLANT
LABEL OR SOLS (LABEL) ×4 IMPLANT
NEEDLE HYPO 22GX1.5 SAFETY (NEEDLE) ×4 IMPLANT
NS IRRIG 1000ML POUR BTL (IV SOLUTION) ×4 IMPLANT
NS IRRIG 500ML POUR BTL (IV SOLUTION) ×4 IMPLANT
PACK BASIN MAJOR ARMC (MISCELLANEOUS) ×4 IMPLANT
PACK LAP CHOLECYSTECTOMY (MISCELLANEOUS) ×4 IMPLANT
PENCIL ELECTRO HAND CTR (MISCELLANEOUS) ×4 IMPLANT
POUCH ENDO CATCH 10MM SPEC (MISCELLANEOUS) IMPLANT
SEAL FOR SCOPE WARMER C3101 (MISCELLANEOUS) ×4 IMPLANT
SET YANKAUER POOLE SUCT (MISCELLANEOUS) ×4 IMPLANT
SPONGE LAP 18X18 5 PK (GAUZE/BANDAGES/DRESSINGS) ×4 IMPLANT
STAPLER SKIN PROX 35W (STAPLE) ×4 IMPLANT
STRIP CLOSURE SKIN 1/2X4 (GAUZE/BANDAGES/DRESSINGS) ×3 IMPLANT
SUT ETH BLK MONO 3 0 FS 1 12/B (SUTURE) ×4 IMPLANT
SUT PROLENE 0 CT 1 30 (SUTURE) ×16 IMPLANT
SUT SILK 3-0 (SUTURE) ×4 IMPLANT
SUT VIC AB 0 SH 27 (SUTURE) ×4 IMPLANT
SUT VIC AB 2-0 BRD 54 (SUTURE) ×4 IMPLANT
SUT VIC AB 3-0 SH 27 (SUTURE) ×4
SUT VIC AB 3-0 SH 27X BRD (SUTURE) ×4 IMPLANT
SUT VIC AB 4-0 FS2 27 (SUTURE) ×4 IMPLANT
SUT VICRYL 3-0 SH-1 18IN (SUTURE) ×4 IMPLANT
SWABSTK COMLB BENZOIN TINCTURE (MISCELLANEOUS) ×4 IMPLANT
TROCAR XCEL UNIV SLVE 11M 100M (ENDOMECHANICALS) ×4 IMPLANT
TUBING INSUFFLATOR HI FLOW (MISCELLANEOUS) ×4 IMPLANT

## 2017-04-01 NOTE — Anesthesia Preprocedure Evaluation (Signed)
Anesthesia Evaluation  Patient identified by MRN, date of birth, ID band Patient awake    Reviewed: Allergy & Precautions, NPO status , Patient's Chart, lab work & pertinent test results  History of Anesthesia Complications Negative for: history of anesthetic complications  Airway Mallampati: II  TM Distance: >3 FB Neck ROM: Full    Dental  (+) Missing   Pulmonary neg sleep apnea, neg COPD, former smoker,    breath sounds clear to auscultation- rhonchi (-) wheezing      Cardiovascular Exercise Tolerance: Good hypertension, Pt. on medications (-) CAD and (-) Past MI  Rhythm:Regular Rate:Normal - Systolic murmurs and - Diastolic murmurs    Neuro/Psych negative neurological ROS  negative psych ROS   GI/Hepatic negative GI ROS, Neg liver ROS, Partial SBO   Endo/Other  negative endocrine ROSneg diabetes  Renal/GU negative Renal ROS     Musculoskeletal negative musculoskeletal ROS (+)   Abdominal (+) - obese,   Peds  Hematology negative hematology ROS (+)   Anesthesia Other Findings Past Medical History: No date: Elevated lipids 2007: Hip fracture (HCC) No date: Hyperlipidemia No date: Hypertension   Reproductive/Obstetrics                             Anesthesia Physical Anesthesia Plan  ASA: II  Anesthesia Plan: General   Post-op Pain Management:    Induction: Intravenous  PONV Risk Score and Plan: 2 and Ondansetron, Dexamethasone, Treatment may vary due to age, Propofol and Midazolam  Airway Management Planned: Oral ETT  Additional Equipment:   Intra-op Plan:   Post-operative Plan: Extubation in OR  Informed Consent: I have reviewed the patients History and Physical, chart, labs and discussed the procedure including the risks, benefits and alternatives for the proposed anesthesia with the patient or authorized representative who has indicated his/her understanding and  acceptance.   Dental advisory given  Plan Discussed with: CRNA and Anesthesiologist  Anesthesia Plan Comments:         Anesthesia Quick Evaluation

## 2017-04-01 NOTE — Progress Notes (Signed)
AVSS. Pain free. Several small stools after gastrograffin.  No nausea. Feels abdomen is still a little bloated. NG: 1400 cc, thin; some likely ice chips. Lungs: Clear. Cardio: RR. ABD: Soft., distended. Plain films: Few modestly dilated loops of small bowel in the left upper quadrant.. Contrast to rectum. Distal small bowel remains relatively decompressed. IMP: Persistent PSBO. Plan: Diagnostic laparoscopy, possible laparotomy.

## 2017-04-01 NOTE — Anesthesia Post-op Follow-up Note (Cosign Needed)
Anesthesia QCDR form completed.        

## 2017-04-01 NOTE — Op Note (Signed)
Preoperative diagnosis: Partial small bowel obstruction.  Postoperative diagnosis: Same, no clear transition or active intra-abdominal pathology.  Operative procedure: Diagnostic laparoscopy.  Operating surgeon: Ollen Bowl, M.D.  Anesthesia: Gen. endotracheal, Marcaine 0.5% with 1-200,000 epinephrine, 20 mL.  Clinical note: This 71 year old male had been well until 1 week ago when he had the sudden onset of abdominal bloating followed by nausea and vomiting abdominal pain. CT scan on 03/26/2017 had suggested possible small bowel inflammation with no clear transition point in the jejunoileal area. He subsequently was discharged home and had continued episodes of nausea and vomiting. He was reevaluated on 03/30/2017 which time a CT scan showed progressive dilatation of proximal small bowel without a clear transition point or other intra-abdominal pathology. He was treated with nasogastric decompression and had resolution of his abdominal discomfort. A Gastrografin small bowel series showed mild dilatation of the proximal one half of the small bowel but transit to the colon within 4 hours and then transected to the rectum. Follow-up films the next morning showed a less dilatation of the small bowel but during this time the patient had drained 1400 mL of NG fluid over the preceding 12 hours. With no past history of surgical intervention are intra-abdominal infections or was no clear reason for him to have a small bowel obstruction and was elected to do a diagnostic laparoscopy.  The patient underwent general endotracheal anesthesia without difficulty. A Foley catheter was placed by the nurse. The abdomen was clipped and prepped with ChloraPrep and draped.  A incision above the umbilicus was made carried at the skin and subcutaneous tissue. The fascia followed by the peritoneum was opened directly. A 0 Maxon baseball stitch was placed followed by a Hassan cannula. Pneumoperitoneum was established. Mild  ruddy but viable on proximal small bowel was noted. This was followed from the ligament of Treitz to the terminal ileum. There was a area of transition from [place] mildly dilated bowel to the normal pink small bowel without any evidence of associated pathology. No intra-abdominal bands or adhesions. The area of the appendix was dissected and there appeared to be essentially a retrocecal appendix. The peritoneum overlying the cecum was freed and there is no evidence of proximal dilatation from this level to the mid small bowel and further dissection was not undertaken. Of note the appendix had filled after yesterday small bowel follow-through with Gastrografin. Inspection showed no intra-abdominal fluid, nodularity or evidence of impaired vascularity. No inflammation of the mesentery. The visualized portion of the right and transverse colon was unremarkable. In the left lower quadrant there was evidence of some scarring to the intra-abdominal wall from the sigmoid colon but no evidence of active diverticulitis. There was evidence of a patent internal ring on the right side but no evidence of recent inflammation or incarceration. No direct hernias noted. No evidence of femoral hernia.  A small bleeding from the mesentery was notified and a hematoma appreciated partially 1.57 m in diameter. This did not show expansion and it aspiration of the blood showed no continued pooling. The abdomen was then closed. 2-11 mm XL ports had been placed under direct vision one in the left epigastrium and one in the left lateral abdominal wall. These port sites were closed with trans-fascial 0 Maxon sutures done under direct vision. The pursestring suture at the supraumbilical Hassan cannula site was snugged up and then the fascia reinforced with 0 Maxon figure-of-eight sutures. The adipose layer was closed with 3-0 Vicryl. The skin was closed with running 4-0  Vicryl septic suture. Benzoin and Steri-Strips are applied after  installation of local anesthetic for postoperative analgesia. Telfa and Tegaderm dressings were applied.  Urine output during the procedure 300 mL. Foley catheter was removed in the operating room.  The patient tolerated the procedure well and was taken to recovery in stable condition.

## 2017-04-01 NOTE — Progress Notes (Signed)
Initial Nutrition Assessment  DOCUMENTATION CODES:   Severe malnutrition in context of acute illness/injury, Obesity unspecified  INTERVENTION:  Will monitor plan of care per Surgery and recommend appropriate intervention.  When patient's diet able to be advanced he would benefit from an oral nutrition supplement to help meet calorie/protein needs and prevent further weight loss.   Patient currently day 8 of inadequate intake per his report. If diet unable to be advanced by day 10 (04/03/2017) recommend TPN at that time.   NUTRITION DIAGNOSIS:   Malnutrition (Severe) related to acute illness (partial small bowel obstruction) as evidenced by energy intake < or equal to 50% for > or equal to 5 days, 3.9 percent weight loss over 1 week, moderate depletion of body fat.  GOAL:   Patient will meet greater than or equal to 90% of their needs  MONITOR:   Diet advancement, Labs, Weight trends, I & O's  REASON FOR ASSESSMENT:   Malnutrition Screening Tool    ASSESSMENT:   71 year old male with PMHx of HLD, HTN presented with vomiting, bloating, and abdominal pain found to have partial SBO.   -Pt s/p gastrograffin SBFT on 6/5.  -Per Surgery note this AM plan is for diagnostic laparoscopy and possible laparotomy today.  Spoke with patient and wife at bedside. Patient reports that since 5/30 he has not been able to keep food and liquids down. He would attempt to eat foods such as toast, chicken and rice soup, or oatmeal but would experience vomiting of undigested food approximately 4 hours later. Reports he normally eats 3 good meals per day.   UBW 232 lbs. RD obtained bed scale wight of 222.9 lbs. He has lost approximately 9.1 lbs (3.9% body weight) over the past week, which is significant for time frame.   Medications reviewed and include: D5NS with KCl 40 mEq/L @ 100 ml/hr (120 grams dextrose, 408 kcal daily).   Labs reviewed: Chloride 97, Glucose 128.   I/O: NGT with 2L output  yesterday, 350 ml output today  Nutrition-Focused physical exam completed. Findings are moderate fat depletion, no muscle depletion, and no edema.   Diet Order:  Diet NPO time specified Except for: Sips with Meds, Ice Chips  Skin:  Reviewed, no issues  Last BM:  04/01/2017 - type 7  Height:   Ht Readings from Last 1 Encounters:  03/30/17 6\' 1"  (1.854 m)    Weight:   Wt Readings from Last 1 Encounters:  04/01/17 222 lb 14.4 oz (101.1 kg)    Ideal Body Weight:  83.6 kg  BMI:  Body mass index is 29.41 kg/m.  Estimated Nutritional Needs:   Kcal:  1027-2536 (MSJ x 1.2-1.3)  Protein:  100-120 grams (1-1.2 grams/kg)  Fluid:  2.2-2.4 L/day  EDUCATION NEEDS:   No education needs identified at this time  Willey Blade, MS, RD, LDN Pager: [phone] After Hours Pager: [phone]

## 2017-04-01 NOTE — Anesthesia Postprocedure Evaluation (Signed)
Anesthesia Post Note  Patient: Drew Mcmahon  Procedure(s) Performed: Procedure(s) (LRB): LAPAROSCOPY DIAGNOSTIC (N/A)  Patient location during evaluation: PACU Anesthesia Type: General Level of consciousness: awake and alert and oriented Pain management: pain level controlled Vital Signs Assessment: post-procedure vital signs reviewed and stable Respiratory status: spontaneous breathing, nonlabored ventilation and respiratory function stable Cardiovascular status: blood pressure returned to baseline and stable Postop Assessment: no signs of nausea or vomiting Anesthetic complications: no     Last Vitals:  Vitals:   04/01/17 1948 04/01/17 1959  BP: 138/69 (!) 148/56  Pulse: (!) 55 (!) 56  Resp: 19 16  Temp:  36.6 C    Last Pain:  Vitals:   04/01/17 1959  TempSrc: Oral  PainSc:                  [name]

## 2017-04-01 NOTE — Anesthesia Procedure Notes (Signed)
Procedure Name: Intubation Date/Time: 04/01/2017 5:04 PM Performed by: Aline Brochure Pre-anesthesia Checklist: Patient identified, Emergency Drugs available, Suction available and Patient being monitored Patient Re-evaluated:Patient Re-evaluated prior to inductionOxygen Delivery Method: Circle system utilized Preoxygenation: Pre-oxygenation with 100% oxygen Intubation Type: IV induction Ventilation: Mask ventilation without difficulty Laryngoscope Size: Mac and 4 Grade View: Grade I Tube type: Oral Tube size: 7.5 mm Number of attempts: 1 Airway Equipment and Method: Stylet Placement Confirmation: ETT inserted through vocal cords under direct vision,  positive ETCO2 and breath sounds checked- equal and bilateral Secured at: 21 cm Tube secured with: Tape Dental Injury: Teeth and Oropharynx as per pre-operative assessment

## 2017-04-01 NOTE — Transfer of Care (Signed)
Immediate Anesthesia Transfer of Care Note  Patient: Drew Mcmahon  Procedure(s) Performed: Procedure(s): LAPAROSCOPY DIAGNOSTIC (N/A)  Patient Location: PACU  Anesthesia Type:General  Level of Consciousness: awake and sedated  Airway & Oxygen Therapy: Patient Spontanous Breathing and Patient connected to face mask oxygen  Post-op Assessment: Report given to RN and Post -op Vital signs reviewed and stable  Post vital signs: Reviewed and stable  Last Vitals:  Vitals:   04/01/17 0958 04/01/17 1212  BP: (!) 151/69 (!) 148/61  Pulse: 65 (!) 56  Resp:  18  Temp:  36.8 C    Last Pain:  Vitals:   04/01/17 1212  TempSrc: Oral  PainSc:       Patients Stated Pain Goal: 1 (07/46/00 2984)  Complications: No apparent anesthesia complications

## 2017-04-02 ENCOUNTER — Encounter: Payer: Self-pay | Admitting: General Surgery

## 2017-04-02 LAB — BASIC METABOLIC PANEL
Anion gap: 4 — ABNORMAL LOW (ref 5–15)
BUN: 14 mg/dL (ref 6–20)
CALCIUM: 8.3 mg/dL — AB (ref 8.9–10.3)
CO2: 27 mmol/L (ref 22–32)
CREATININE: 0.86 mg/dL (ref 0.61–1.24)
Chloride: 111 mmol/L (ref 101–111)
GFR calc non Af Amer: >60 mL/min (ref >60)
Glucose, Bld: 136 mg/dL — ABNORMAL HIGH (ref 65–99)
Potassium: 4.6 mmol/L (ref 3.5–5.1)
SODIUM: 142 mmol/L (ref 135–145)

## 2017-04-02 LAB — CBC
HEMATOCRIT: 39.8 % — AB (ref 40.0–52.0)
Hemoglobin: 13.6 g/dL (ref 13.0–18.0)
MCH: 30.9 pg (ref 26.0–34.0)
MCHC: 34.2 g/dL (ref 32.0–36.0)
MCV: 90.3 fL (ref 80.0–100.0)
Platelets: 212 10*3/uL (ref 150–440)
RBC: 4.41 MIL/uL (ref 4.40–5.90)
RDW: 13.1 % (ref 11.5–14.5)
WBC: 11.7 10*3/uL — ABNORMAL HIGH (ref 3.8–10.6)

## 2017-04-02 NOTE — Progress Notes (Signed)
AVSS. Quiet night. Minimal soreness. Lungs: Clear. Cardio: RR. ABD: Soft, possibly less distended than preop. No flatus,  BM. Labs: Reviewed. Plan: Trial PO liquids.

## 2017-04-03 ENCOUNTER — Inpatient Hospital Stay: Payer: Medicare Other

## 2017-04-03 ENCOUNTER — Inpatient Hospital Stay: Payer: Medicare Other | Admitting: Anesthesiology

## 2017-04-03 ENCOUNTER — Encounter
Admission: AD | Disposition: A | Discharge status: Home / Self Care | Payer: Self-pay | Source: Ambulatory Visit | Attending: General Surgery

## 2017-04-03 ENCOUNTER — Encounter: Payer: Self-pay | Admitting: *Deleted

## 2017-04-03 DIAGNOSIS — K315 Obstruction of duodenum: Secondary | ICD-10-CM

## 2017-04-03 HISTORY — PX: BOWEL RESECTION: SHX1257

## 2017-04-03 HISTORY — PX: LAPAROTOMY: SHX154

## 2017-04-03 HISTORY — PX: APPENDECTOMY: SHX54

## 2017-04-03 HISTORY — PX: LYSIS OF ADHESION: SHX5961

## 2017-04-03 SURGERY — LAPAROTOMY, EXPLORATORY
Anesthesia: General

## 2017-04-03 MED ORDER — METOPROLOL TARTRATE 5 MG/5ML IV SOLN
INTRAVENOUS | Status: DC | PRN
  Administered 2017-04-03 (×2): 1 mg via INTRAVENOUS

## 2017-04-03 MED ORDER — FENTANYL CITRATE (PF) 250 MCG/5ML IJ SOLN
INTRAMUSCULAR | Status: AC
  Filled 2017-04-03: qty 5

## 2017-04-03 MED ORDER — FENTANYL CITRATE (PF) 100 MCG/2ML IJ SOLN
25.0000 ug | INTRAMUSCULAR | Status: DC | PRN
  Administered 2017-04-03 (×4): 25 ug via INTRAVENOUS

## 2017-04-03 MED ORDER — LIDOCAINE HCL (CARDIAC) 20 MG/ML IV SOLN
INTRAVENOUS | Status: DC | PRN
  Administered 2017-04-03: 50 mg via INTRAVENOUS

## 2017-04-03 MED ORDER — SUGAMMADEX SODIUM 200 MG/2ML IV SOLN
INTRAVENOUS | Status: AC
  Filled 2017-04-03: qty 2

## 2017-04-03 MED ORDER — SUCCINYLCHOLINE CHLORIDE 20 MG/ML IJ SOLN
INTRAMUSCULAR | Status: DC | PRN
  Administered 2017-04-03: 100 mg via INTRAVENOUS

## 2017-04-03 MED ORDER — SUGAMMADEX SODIUM 200 MG/2ML IV SOLN
INTRAVENOUS | Status: DC | PRN
  Administered 2017-04-03: 200 mg via INTRAVENOUS

## 2017-04-03 MED ORDER — MORPHINE SULFATE 2 MG/ML IV SOLN
INTRAVENOUS | Status: DC
  Administered 2017-04-03: 17 mg via INTRAVENOUS
  Administered 2017-04-03: 19:00:00 via INTRAVENOUS
  Administered 2017-04-04: 8 mg via INTRAVENOUS
  Administered 2017-04-04: 22 mg via INTRAVENOUS
  Administered 2017-04-04: 0 mg via INTRAVENOUS
  Administered 2017-04-04: 12 mg via INTRAVENOUS
  Administered 2017-04-04: 0 mg via INTRAVENOUS
  Administered 2017-04-05: 4 mg via INTRAVENOUS
  Administered 2017-04-05: 2 mg via INTRAVENOUS
  Administered 2017-04-05: 0 mg via INTRAVENOUS
  Filled 2017-04-03 (×4): qty 25

## 2017-04-03 MED ORDER — LIDOCAINE HCL (PF) 2 % IJ SOLN
INTRAMUSCULAR | Status: AC
  Filled 2017-04-03: qty 2

## 2017-04-03 MED ORDER — LACTATED RINGERS IV SOLN
INTRAVENOUS | Status: DC
  Administered 2017-04-03 – 2017-04-09 (×5): via INTRAVENOUS

## 2017-04-03 MED ORDER — SODIUM CHLORIDE 0.9% FLUSH: 9.0000 mL | INTRAVENOUS | Status: DC | PRN

## 2017-04-03 MED ORDER — SODIUM CHLORIDE 0.9% FLUSH
10.0000 mL | Freq: Two times a day (BID) | INTRAVENOUS | Status: DC
  Administered 2017-04-03 – 2017-04-13 (×13): 10 mL

## 2017-04-03 MED ORDER — PROPOFOL 10 MG/ML IV BOLUS
INTRAVENOUS | Status: DC | PRN
  Administered 2017-04-03: 150 mg via INTRAVENOUS

## 2017-04-03 MED ORDER — DIPHENHYDRAMINE HCL 12.5 MG/5ML PO ELIX: 12.5000 mg | ORAL_SOLUTION | Freq: Four times a day (QID) | ORAL | Status: DC | PRN

## 2017-04-03 MED ORDER — ACETAMINOPHEN 10 MG/ML IV SOLN
1000.0000 mg | Freq: Four times a day (QID) | INTRAVENOUS | Status: DC
  Administered 2017-04-03 – 2017-04-04 (×2): 1000 mg via INTRAVENOUS
  Filled 2017-04-03 (×4): qty 100

## 2017-04-03 MED ORDER — DEXAMETHASONE SODIUM PHOSPHATE 10 MG/ML IJ SOLN
INTRAMUSCULAR | Status: DC | PRN
  Administered 2017-04-03: 4 mg via INTRAVENOUS

## 2017-04-03 MED ORDER — FENTANYL CITRATE (PF) 100 MCG/2ML IJ SOLN
INTRAMUSCULAR | Status: DC | PRN
Start: 2017-04-03 — End: 2017-04-03
  Administered 2017-04-03 (×5): 50 ug via INTRAVENOUS
  Administered 2017-04-03: 100 ug via INTRAVENOUS

## 2017-04-03 MED ORDER — ROCURONIUM BROMIDE 50 MG/5ML IV SOLN
INTRAVENOUS | Status: AC
  Filled 2017-04-03: qty 1

## 2017-04-03 MED ORDER — ROCURONIUM BROMIDE 100 MG/10ML IV SOLN
INTRAVENOUS | Status: DC | PRN
  Administered 2017-04-03: 40 mg via INTRAVENOUS
  Administered 2017-04-03: 20 mg via INTRAVENOUS
  Administered 2017-04-03: 10 mg via INTRAVENOUS
  Administered 2017-04-03: 20 mg via INTRAVENOUS

## 2017-04-03 MED ORDER — MORPHINE SULFATE 2 MG/ML IV SOLN: INTRAVENOUS | Status: DC

## 2017-04-03 MED ORDER — DEXAMETHASONE SODIUM PHOSPHATE 10 MG/ML IJ SOLN
INTRAMUSCULAR | Status: AC
  Filled 2017-04-03: qty 1

## 2017-04-03 MED ORDER — SUCCINYLCHOLINE CHLORIDE 20 MG/ML IJ SOLN
INTRAMUSCULAR | Status: AC
  Filled 2017-04-03: qty 1

## 2017-04-03 MED ORDER — DIPHENHYDRAMINE HCL 50 MG/ML IJ SOLN: 12.5000 mg | Freq: Four times a day (QID) | INTRAMUSCULAR | Status: DC | PRN

## 2017-04-03 MED ORDER — FENTANYL CITRATE (PF) 100 MCG/2ML IJ SOLN
INTRAMUSCULAR | Status: AC
  Filled 2017-04-03: qty 2

## 2017-04-03 MED ORDER — ONDANSETRON HCL 4 MG/2ML IJ SOLN
4.0000 mg | Freq: Four times a day (QID) | INTRAMUSCULAR | Status: DC | PRN
Start: 2017-04-03 — End: 2017-04-14
  Administered 2017-04-04: 4 mg via INTRAVENOUS
  Filled 2017-04-03 (×2): qty 2

## 2017-04-03 MED ORDER — SODIUM CHLORIDE FLUSH 0.9 % IV SOLN
INTRAVENOUS | Status: AC
  Filled 2017-04-03: qty 10

## 2017-04-03 MED ORDER — ONDANSETRON HCL 4 MG/2ML IJ SOLN: 4.0000 mg | Freq: Once | INTRAMUSCULAR | Status: DC | PRN

## 2017-04-03 MED ORDER — EPHEDRINE SULFATE 50 MG/ML IJ SOLN
INTRAMUSCULAR | Status: DC | PRN
  Administered 2017-04-03: 10 mg via INTRAVENOUS

## 2017-04-03 MED ORDER — SODIUM CHLORIDE 0.9% FLUSH: 10.0000 mL | INTRAVENOUS | Status: DC | PRN

## 2017-04-03 MED ORDER — NALOXONE HCL 0.4 MG/ML IJ SOLN: 0.4000 mg | INTRAMUSCULAR | Status: DC | PRN

## 2017-04-03 MED ORDER — ONDANSETRON HCL 4 MG/2ML IJ SOLN
INTRAMUSCULAR | Status: DC | PRN
  Administered 2017-04-03: 4 mg via INTRAVENOUS

## 2017-04-03 MED ORDER — PROMETHAZINE HCL 25 MG/ML IJ SOLN
12.5000 mg | INTRAMUSCULAR | Status: DC | PRN
  Administered 2017-04-05: 12.5 mg via INTRAVENOUS
  Filled 2017-04-03: qty 1

## 2017-04-03 MED ORDER — PROPOFOL 10 MG/ML IV BOLUS
INTRAVENOUS | Status: AC
  Filled 2017-04-03: qty 20

## 2017-04-03 SURGICAL SUPPLY — 44 items
BULB RESERV EVAC DRAIN JP 100C (MISCELLANEOUS) IMPLANT
CANISTER SUCT 1200ML W/VALVE (MISCELLANEOUS) ×4 IMPLANT
CATH TRAY 16F METER LATEX (MISCELLANEOUS) IMPLANT
CHLORAPREP W/TINT 26ML (MISCELLANEOUS) ×4 IMPLANT
COVER CLAMP SIL LG PBX B (MISCELLANEOUS) IMPLANT
DRAIN CHANNEL JP 15F RND 16 (MISCELLANEOUS) IMPLANT
DRAPE LAPAROTOMY 100X77 ABD (DRAPES) ×4 IMPLANT
DRSG OPSITE POSTOP 4X12 (GAUZE/BANDAGES/DRESSINGS) ×4 IMPLANT
DRSG TELFA 3X8 NADH (GAUZE/BANDAGES/DRESSINGS) IMPLANT
ELECT BLADE 6 FLAT ULTRCLN (ELECTRODE) ×4 IMPLANT
ELECT REM PT RETURN 9FT ADLT (ELECTROSURGICAL) ×4
ELECTRODE REM PT RTRN 9FT ADLT (ELECTROSURGICAL) ×2 IMPLANT
GAUZE SPONGE 4X4 12PLY STRL (GAUZE/BANDAGES/DRESSINGS) IMPLANT
GLOVE BIO SURGEON STRL SZ7.5 (GLOVE) ×4 IMPLANT
GLOVE INDICATOR 8.0 STRL GRN (GLOVE) ×4 IMPLANT
GOWN STRL REUS W/ TWL LRG LVL3 (GOWN DISPOSABLE) ×4 IMPLANT
GOWN STRL REUS W/TWL LRG LVL3 (GOWN DISPOSABLE) ×4
HOLDER FOLEY CATH W/STRAP (MISCELLANEOUS) IMPLANT
KIT RM TURNOVER STRD PROC AR (KITS) ×4 IMPLANT
LABEL OR SOLS (LABEL) ×4 IMPLANT
NS IRRIG 1000ML POUR BTL (IV SOLUTION) ×4 IMPLANT
PACK BASIN MAJOR ARMC (MISCELLANEOUS) ×4 IMPLANT
SET YANKAUER POOLE SUCT (MISCELLANEOUS) ×4 IMPLANT
SPONGE DRAIN TRACH 4X4 STRL 2S (GAUZE/BANDAGES/DRESSINGS) ×4 IMPLANT
SPONGE LAP 18X18 5 PK (GAUZE/BANDAGES/DRESSINGS) IMPLANT
STAPLER PROXIMATE 55 BLUE (STAPLE) ×4 IMPLANT
STAPLER PROXIMATE 75MM BLUE (STAPLE) ×4 IMPLANT
STAPLER SKIN PROX 35W (STAPLE) ×4 IMPLANT
SUT ETH BLK MONO 3 0 FS 1 12/B (SUTURE) ×4 IMPLANT
SUT ETHILON 2 0 FS 18 (SUTURE) ×4 IMPLANT
SUT ETHILON 3-0 FS-10 30 BLK (SUTURE) ×4
SUT PROLENE 0 CT 1 30 (SUTURE) ×16 IMPLANT
SUT SILK 3-0 (SUTURE) ×4 IMPLANT
SUT VIC AB 2-0 BRD 54 (SUTURE) ×8 IMPLANT
SUT VIC AB 2-0 CT1 27 (SUTURE) ×2
SUT VIC AB 2-0 CT1 TAPERPNT 27 (SUTURE) ×2 IMPLANT
SUT VIC AB 2-0 SH 27 (SUTURE) ×2
SUT VIC AB 2-0 SH 27XBRD (SUTURE) ×2 IMPLANT
SUT VIC AB 3-0 SH 27 (SUTURE) ×4
SUT VIC AB 3-0 SH 27X BRD (SUTURE) ×4 IMPLANT
SUT VICRYL 3-0 SH-1 18IN (SUTURE) ×4 IMPLANT
SUTURE EHLN 3-0 FS-10 30 BLK (SUTURE) ×2 IMPLANT
TRAY FOLEY W/METER SILVER 16FR (SET/KITS/TRAYS/PACK) ×4 IMPLANT
TUBE JEJUNO 18X14 (TUBING) ×4 IMPLANT

## 2017-04-03 NOTE — Progress Notes (Signed)
Nutrition Follow-up  DOCUMENTATION CODES:   Severe malnutrition in context of acute illness/injury, Obesity unspecified  INTERVENTION:  As patient now day 10 of inadequate intake, meets criteria for severe acute malnutrition, and will now be undergoing exploratory laparotomy today, recommend TPN to meet calorie and protein needs and prevent further unintentional weight loss.  Patient would benefit from oral nutrition supplement when diet able to be advanced.  Discussed patient with Dr. Byrnett this AM before patient was made NPO from x-ray findings. Will continue to monitor plan of care.  NUTRITION DIAGNOSIS:   Malnutrition (Severe) related to acute illness (partial small bowel obstruction) as evidenced by energy intake < or equal to 50% for > or equal to 5 days, 3.9 percent weight loss over 1 week, moderate depletion of body fat.  Ongoing.  GOAL:   Patient will meet greater than or equal to 90% of their needs  Not met.  MONITOR:   Diet advancement, Labs, Weight trends, I & O's  REASON FOR ASSESSMENT:   Malnutrition Screening Tool    ASSESSMENT:   71 year old male with PMHx of HLD, HTN presented with vomiting, bloating, and abdominal pain found to have partial SBO.  -Patient s/p diagnostic laparoscopy on 6/6 for partial SBO. No clear transition or active intra-abdominal pathology found per op note. -NGT was removed afternoon of 6/6. Diet was advanced to CLD on morning of 6/7. -Since assessment this AM patient has now been made NPO again. Per abdominal x-ray this morning there are now multiple loops of dilated small bowel with multiple air-fluid levels indicative of bowel obstruction. -Patient now scheduled for exploratory laparotomy today.  Spoke with patient and wife at bedside. He reports he is tolerating the clear liquids well, though he did better yesterday than this morning. Reports some abdominal pain. Denies any nausea. Endorses some flatus but reports he has not had  a BM yet.  Meal Completion: 100% of clear liquid trays at lunch and dinner last night, everything on tray this morning except Italian ice and Coke In the past 24 hours patient has had approximately 1016 kcal (46% minimum estimated kcal needs) and 4 grams of protein (4% minimum estimated protein needs). Even with 100% completion difficult to meet calorie and protein needs on clear liquid diet without clear oral nutrition supplements.  Medications reviewed and include: LR @ 75 ml/hr.  Labs reviewed: Glucose 136, Anion gap 4.  Discussed with RN.  Diet Order:  Diet NPO time specified Except for: Sips with Meds  Skin:  Wound (see comment) (closed incision on abdomen from diagnostic laparoscopy)  Last BM:  04/01/2017 - type 7  Height:   Ht Readings from Last 1 Encounters:  03/30/17 6' 1" (1.854 m)    Weight:   Wt Readings from Last 1 Encounters:  04/01/17 222 lb 14.4 oz (101.1 kg)    Ideal Body Weight:  83.6 kg  BMI:  Body mass index is 29.41 kg/m.  Estimated Nutritional Needs:   Kcal:  2195-2375 (MSJ x 1.2-1.3)  Protein:  100-120 grams (1-1.2 grams/kg)  Fluid:  2.2-2.4 L/day  EDUCATION NEEDS:   No education needs identified at this time   Stephens, MS, RD, LDN Pager: [phone] After Hours Pager: [phone]  

## 2017-04-03 NOTE — Anesthesia Preprocedure Evaluation (Signed)
Anesthesia Evaluation  Patient identified by MRN, date of birth, ID band Patient awake    Reviewed: Allergy & Precautions, NPO status , Patient's Chart, lab work & pertinent test results  Airway Mallampati: II       Dental  (+) Teeth Intact   Pulmonary neg pulmonary ROS, former smoker,    breath sounds clear to auscultation       Cardiovascular Exercise Tolerance: Good hypertension, Pt. on home beta blockers  Rhythm:Regular     Neuro/Psych negative psych ROS   GI/Hepatic negative GI ROS, Neg liver ROS, (+)     (-) substance abuse  ,   Endo/Other    Renal/GU negative Renal ROS     Musculoskeletal   Abdominal (+) + obese,   Peds negative pediatric ROS (+)  Hematology negative hematology ROS (+)   Anesthesia Other Findings   Reproductive/Obstetrics                             Anesthesia Physical Anesthesia Plan  ASA: II and emergent  Anesthesia Plan: General   Post-op Pain Management:    Induction: Intravenous  PONV Risk Score and Plan: 1 and Ondansetron and Treatment may vary due to age  Airway Management Planned: Oral ETT  Additional Equipment:   Intra-op Plan:   Post-operative Plan: Extubation in OR  Informed Consent: I have reviewed the patients History and Physical, chart, labs and discussed the procedure including the risks, benefits and alternatives for the proposed anesthesia with the patient or authorized representative who has indicated his/her understanding and acceptance.     Plan Discussed with: CRNA  Anesthesia Plan Comments:         Anesthesia Quick Evaluation

## 2017-04-03 NOTE — Progress Notes (Signed)
Peripherally Inserted Central Catheter/Midline Placement  The IV Nurse has discussed with the patient and/or persons authorized to consent for the patient, the purpose of this procedure and the potential benefits and risks involved with this procedure.  The benefits include less needle sticks, lab draws from the catheter, and the patient may be discharged home with the catheter. Risks include, but not limited to, infection, bleeding, blood clot (thrombus formation), and puncture of an artery; nerve damage and irregular heartbeat and possibility to perform a PICC exchange if needed/ordered by physician.  Alternatives to this procedure were also discussed.  Bard Power PICC patient education guide, fact sheet on infection prevention and patient information card has been provided to patient /or left at bedside.    PICC/Midline Placement Documentation  PICC Double Lumen 04/03/17 PICC Right Brachial 42 cm 5 cm (Active)  Indication for Insertion or Continuance of Line Administration of hyperosmolar/irritating solutions (i.e. TPN, Vancomycin, etc.) 04/03/2017  9:00 PM  Exposed Catheter (cm) 5 cm 04/03/2017  9:00 PM  Dressing Change Due 04/10/17 04/03/2017  9:00 PM       Jule Economy Horton 04/03/2017, 9:14 PM

## 2017-04-03 NOTE — Progress Notes (Signed)
AVSS. Feels well this AM. Some nausea but no pain. NO vomiting. Passing little flatus. No BM. Lungs: Clear. ABD: More distended than yesterday (1500 cc po since yesterday). Soft, non-tender. Extrem: Soft. Nutritional status discussed with dietician. IMP: SBO, no source identified at laparoscopy 6/6. Plan: Laparotomy, possible gastrostomy placement if long term decompression anticipated.

## 2017-04-03 NOTE — Anesthesia Procedure Notes (Addendum)
Procedure Name: Intubation Performed by: Thomasene Dubow Pre-anesthesia Checklist: Patient identified, Patient being monitored, Timeout performed, Emergency Drugs available and Suction available Patient Re-evaluated:Patient Re-evaluated prior to inductionOxygen Delivery Method: Circle system utilized Preoxygenation: Pre-oxygenation with 100% oxygen Intubation Type: IV induction, Rapid sequence and Cricoid Pressure applied Laryngoscope Size: Miller and 2 Grade View: Grade I Tube type: Oral Tube size: 7.5 mm Number of attempts: 1 Airway Equipment and Method: Stylet Placement Confirmation: ETT inserted through vocal cords under direct vision,  positive ETCO2 and breath sounds checked- equal and bilateral Secured at: 23 cm Tube secured with: Tape Dental Injury: Teeth and Oropharynx as per pre-operative assessment      

## 2017-04-03 NOTE — Anesthesia Postprocedure Evaluation (Signed)
Anesthesia Post Note  Patient: Drew Mcmahon  Procedure(s) Performed: Procedure(s) (LRB): EXPLORATORY LAPAROTOMY (N/A) APPENDECTOMY GASTROSTOMY TUBE INSERTION (N/A) FOCAL SMALL BOWEL RESECTION LYSIS OF ADHESION  Patient location during evaluation: PACU Anesthesia Type: General Level of consciousness: awake and alert Pain management: pain level controlled Vital Signs Assessment: post-procedure vital signs reviewed and stable Respiratory status: spontaneous breathing, nonlabored ventilation, respiratory function stable and patient connected to nasal cannula oxygen Cardiovascular status: blood pressure returned to baseline and stable Postop Assessment: no signs of nausea or vomiting Anesthetic complications: no     Last Vitals:  Vitals:   04/03/17 1715 04/03/17 1731  BP: (!) 151/81 (!) 153/75  Pulse:  78  Resp: 17 14  Temp: 36.6 C 36.7 C    Last Pain:  Vitals:   04/03/17 1731  TempSrc: Oral  PainSc:                  Martha Clan

## 2017-04-03 NOTE — Transfer of Care (Signed)
Immediate Anesthesia Transfer of Care Note  Patient: Drew Mcmahon  Procedure(s) Performed: Procedure(s): EXPLORATORY LAPAROTOMY (N/A) APPENDECTOMY GASTROSTOMY TUBE INSERTION (N/A)  Patient Location: PACU  Anesthesia Type:General  Level of Consciousness: alert  and patient cooperative  Airway & Oxygen Therapy: Patient Spontanous Breathing and Patient connected to face mask oxygen  Post-op Assessment: Report given to RN and Post -op Vital signs reviewed and stable  Post vital signs: Reviewed and stable  Last Vitals:  Vitals:   04/03/17 1306 04/03/17 1635  BP: 139/64 108/67  Pulse: 62 74  Resp: 18 20  Temp: 36.9 C 36.4 C    Last Pain:  Vitals:   04/03/17 1306  TempSrc: Oral  PainSc:       Patients Stated Pain Goal: 1 (03/79/55 8316)  Complications: No apparent anesthesia complications

## 2017-04-03 NOTE — Anesthesia Post-op Follow-up Note (Cosign Needed)
Anesthesia QCDR form completed.        

## 2017-04-03 NOTE — Op Note (Signed)
Preoperative diagnosis: Small bowel extraction.  Postoperative diagnosis: Same, mesenteric hematoma, scarring of the terminal ileum in the right lower quadrant.  Operative procedure: Exploratory laparotomy, lysis of adhesions, appendectomy, focal small bowel resection. Gastrostomy placement.  Operating surgeon: Ollen Bowl, M.D.  Assistant: Ellwood Sayers, M.D.  Anesthesia: Gen. endotracheal.  Estimated blood loss: 300 mL.  Fluid replacement: 1500 mL.  Clinical note: This 71 year old male had been in his usual state of health until 8 days ago when he presented to the emergency department with abdominal pain and bloating and vomiting. CT scan showed dilatation the proximal half small bowel without a clear transition point. He was discharged home and remain nauseated with vomiting over the weekend. He was admitted 4 days ago with evidence of persistent small bowel obstruction on follow-up CT scan. He was treated with nasogastric decompression which relieved his bloating. Gastrografin small bowel study showed the bowel was patent reaching the colon within about 6 hours. He was taken to the operating room for diagnostic laparoscopy 2 days ago with no transition point obstruction noted although there was a focal area where the bowel was dilated transitioning down to normal small bowel diameter without evidence of extrinsic lesion, mass effect or mesenteric defect. No mesenteric abnormalities noted at that time although a small hematoma in the small bowel mesentery was noted at the end of the case. The terminal ileum was fairly socked into the right lower quadrant and this was not dissected out at time of laparoscopy. The patient was placed on clear liquid just day and tolerated this well but plain films this morning showed pitcher Frank small bowel obstruction. He was felt to be For exploration.  Operative note: The patient was brought to the [facility] and underwent general anesthesia without difficulty.  The abdomen was prepped with ChloraPrep and draped. A midline incision was made and subsequently extended to allow full exploration. There was dilatation of the proximal two thirds of the small bowel with no evidence of intestinal compromise. Strong pulsations were noted. No evidence of venous engorgement. Small mesenteric nodes were noted at the base of the mesentery consistent with the obstruction but no pathologic nodes were appreciated. Palpation of the liver stomach and colon were unremarkable. Gallbladder previously been visualized was unremarkable. A nasogastric tube was placed by the anesthesiologist and the small bowel contents permit proximally to decompress the bowel. The distal small bowel was unremarkable until the terminal ileum where it was adherent into the right lower quadrant. A fairly tedious dissection was done to free the cecum and the terminal ileum. The appendix was subtotally removed by dividing the base with the GIA stapler and the mesentery controlled with 2-0 Vicryl ties. This was sent for routine histology. Inspection of the abdomen showed no clear pathologic reason for his obstruction. The mesenteric hematoma had been evacuated and while the bowel lumen appeared viable with no clear cause for obstruction at this transition point it was elected to do a focal resection of about 12 cm of small bowel. The mesenteric resection was included with hemostasis achieved by 2-0 Vicryl ties and a single 3-0 Vicryl suture ligature. A side-to-side functional end-to-end anastomosis making use of a GIA-75 stapler was used. Final anastomosis palpated to 3 finger breast. The crotch and an's of the anastomosis were oversewn with 3-0 Vicryl silk figure-of-eight sutures. The mesenteric defect was closed with a running 3-0 Vicryl suture. The abdomen was irrigated and good hemostasis was noted. Because the patient had an NG tube prior to the procedure  and found this very uncomfortable and anticipating all  significant ileus after surgery he was elected to place a gastrostomy tube. An 10 French gastrostomy tube was placed through a double pursestring of 2-0 Vicryl and the anterior bowel wall. The purse string sutures were snugged down and then the gastrostomy site sewn to the anterior abdominal wall in a 3 point fixation. The plastic bumper was tied with a 2-0 nylon suture at the 4 cm mark and then the bumper was anchored to the skin with interrupted 3-0 nylon sutures.  Surgeons gowns and gloves were changed and new sterile field established. With the sponge tape and instrument count correct at the fascia was closed in a single layer with interrupted 0 Maxon figure-of-eight sutures. One 0 Prolene simple suture was used to complete the closure. The adipose layer was approximated with a running 2-0 Vicryl suture. The skin was closed with staples. A honeycomb dressing was applied to the main surgical site and a drain sponge and Tegaderm dressing to the gastrostomy tube. Gastrostomy tube will be placed close drainage.  The patient tolerated the procedure well. I placed a Foley catheter in the operating room. While passage was easy no drainage was returned and it was removed in the recovery room to determine if the patient could void spontaneously.

## 2017-04-04 ENCOUNTER — Encounter: Payer: Self-pay | Admitting: General Surgery

## 2017-04-04 ENCOUNTER — Inpatient Hospital Stay: Payer: Medicare Other

## 2017-04-04 LAB — CBC
HCT: 41 % (ref 40.0–52.0)
Hemoglobin: 14 g/dL (ref 13.0–18.0)
MCH: 31.3 pg (ref 26.0–34.0)
MCHC: 34.1 g/dL (ref 32.0–36.0)
MCV: 91.8 fL (ref 80.0–100.0)
PLATELETS: 240 10*3/uL (ref 150–440)
RBC: 4.47 MIL/uL (ref 4.40–5.90)
RDW: 13.4 % (ref 11.5–14.5)
WBC: 19.5 10*3/uL — ABNORMAL HIGH (ref 3.8–10.6)

## 2017-04-04 LAB — HEPATIC FUNCTION PANEL
ALBUMIN: 2.5 g/dL — AB (ref 3.5–5.0)
ALK PHOS: 54 U/L (ref 38–126)
ALT: 46 U/L (ref 17–63)
AST: 23 U/L (ref 15–41)
BILIRUBIN DIRECT: 0.5 mg/dL (ref 0.1–0.5)
BILIRUBIN TOTAL: 1.5 mg/dL — AB (ref 0.3–1.2)
Indirect Bilirubin: 1 mg/dL — ABNORMAL HIGH (ref 0.3–0.9)
Total Protein: 5.2 g/dL — ABNORMAL LOW (ref 6.5–8.1)

## 2017-04-04 LAB — BASIC METABOLIC PANEL
Anion gap: 4 — ABNORMAL LOW (ref 5–15)
BUN: 16 mg/dL (ref 6–20)
CALCIUM: 7.8 mg/dL — AB (ref 8.9–10.3)
CO2: 27 mmol/L (ref 22–32)
CREATININE: 0.92 mg/dL (ref 0.61–1.24)
Chloride: 104 mmol/L (ref 101–111)
Glucose, Bld: 145 mg/dL — ABNORMAL HIGH (ref 65–99)
Potassium: 4.6 mmol/L (ref 3.5–5.1)
SODIUM: 135 mmol/L (ref 135–145)

## 2017-04-04 LAB — GLUCOSE, CAPILLARY
GLUCOSE-CAPILLARY: 109 mg/dL — AB (ref 65–99)
GLUCOSE-CAPILLARY: 128 mg/dL — AB (ref 65–99)
Glucose-Capillary: 166 mg/dL — ABNORMAL HIGH (ref 65–99)

## 2017-04-04 LAB — MAGNESIUM: MAGNESIUM: 1.7 mg/dL (ref 1.7–2.4)

## 2017-04-04 LAB — PHOSPHORUS: Phosphorus: 4 mg/dL (ref 2.5–4.6)

## 2017-04-04 MED ORDER — TRACE MINERALS CR-CU-MN-SE-ZN 10-1000-500-60 MCG/ML IV SOLN: INTRAVENOUS | Status: DC

## 2017-04-04 MED ORDER — FAT EMULSION 20 % IV EMUL
250.0000 mL | INTRAVENOUS | Status: AC
  Administered 2017-04-04: 250 mL via INTRAVENOUS
  Filled 2017-04-04: qty 250

## 2017-04-04 MED ORDER — INSULIN ASPART 100 UNIT/ML ~~LOC~~ SOLN
0.0000 [IU] | Freq: Three times a day (TID) | SUBCUTANEOUS | Status: DC
  Administered 2017-04-04: 2 [IU] via SUBCUTANEOUS
  Administered 2017-04-05: 3 [IU] via SUBCUTANEOUS
  Administered 2017-04-05: 2 [IU] via SUBCUTANEOUS
  Administered 2017-04-05: 3 [IU] via SUBCUTANEOUS
  Filled 2017-04-04 (×2): qty 3
  Filled 2017-04-04 (×2): qty 2

## 2017-04-04 MED ORDER — TRACE MINERALS CR-CU-MN-SE-ZN 10-1000-500-60 MCG/ML IV SOLN
INTRAVENOUS | Status: AC
  Administered 2017-04-04: 18:00:00 via INTRAVENOUS
  Filled 2017-04-04: qty 1200

## 2017-04-04 MED ORDER — CLINIMIX E/DEXTROSE (5/15) 5 % IV SOLN: INTRAVENOUS | Status: DC

## 2017-04-04 MED ORDER — FAT EMULSION 20 % IV EMUL: 250.0000 mL | INTRAVENOUS | Status: DC

## 2017-04-04 MED ORDER — ACETAMINOPHEN 10 MG/ML IV SOLN
1000.0000 mg | Freq: Four times a day (QID) | INTRAVENOUS | Status: AC
  Administered 2017-04-04 – 2017-04-05 (×4): 1000 mg via INTRAVENOUS
  Filled 2017-04-04 (×4): qty 100

## 2017-04-04 NOTE — Progress Notes (Signed)
AVSS. Sats fine. Good pain control with PCA. Voiding at baseline. Lungs: Clear. (CXR reviewed suggesting LLL atelectasis/ vs aspiration)/ Inspirex at 1750. ABD: Distended, soft. BS+. Dressing: Dry. Extrem: Soft.  Labs: WBC 19,500,Platelets 240K, HGB at 14. Glucose 145, normal electrolytes.  Doing well. Reviewed operative findings w/ patient and oldest son. Plan: Start TPN, ambulate.

## 2017-04-04 NOTE — Progress Notes (Signed)
Brief Nutrition Note  Consult received for parenteral nutrition. Central Access: PICC R brachial, double lumen  Per RD Assessment, pt has had poor po intake x10 days. Not to start at goal.   Pharmacy to initiate Clinimix 5%AA/15%Dextrose at 73ml/hr with MVI and Trace Minerals at this time.    Once stabilized, increase to 51ml/hr or 2 liters/day to provide 1420 kcals + 480(from IVFE) and 100 g Pro.   Admitting Dx: Small bowel obstruction  N/A bowell obstruction  Body mass index is 29.29 kg/m. Pt meets criteria for overweight based on current BMI.  Labs:  Recent Labs Lab 03/30/17 1724 04/01/17 2051 04/02/17 0515 04/04/17 0525  NA 137  --  142 135  K 3.5  --  4.6 4.6  CL 97*  --  111 104  CO2 29  --  27 27  BUN 15  --  14 16  CREATININE 0.98 0.92 0.86 0.92  CALCIUM 9.0  --  8.3* 7.8*   Burtis Junes RD, LDN, CNSC Clinical Nutrition Pager: [phone] 04/04/2017 12:06 PM

## 2017-04-05 ENCOUNTER — Inpatient Hospital Stay: Payer: Medicare Other

## 2017-04-05 LAB — GLUCOSE, CAPILLARY
GLUCOSE-CAPILLARY: 161 mg/dL — AB (ref 65–99)
GLUCOSE-CAPILLARY: 138 mg/dL — AB (ref 65–99)
Glucose-Capillary: 154 mg/dL — ABNORMAL HIGH (ref 65–99)
Glucose-Capillary: 154 mg/dL — ABNORMAL HIGH (ref 65–99)

## 2017-04-05 LAB — CBC
HCT: 35.9 % — ABNORMAL LOW (ref 40.0–52.0)
HEMOGLOBIN: 12.5 g/dL — AB (ref 13.0–18.0)
MCH: 31.7 pg (ref 26.0–34.0)
MCHC: 34.8 g/dL (ref 32.0–36.0)
MCV: 91 fL (ref 80.0–100.0)
Platelets: 184 10*3/uL (ref 150–440)
RBC: 3.94 MIL/uL — ABNORMAL LOW (ref 4.40–5.90)
RDW: 13.4 % (ref 11.5–14.5)
WBC: 27.5 10*3/uL — AB (ref 3.8–10.6)

## 2017-04-05 LAB — URINALYSIS, ROUTINE W REFLEX MICROSCOPIC
Bilirubin Urine: NEGATIVE
GLUCOSE, UA: NEGATIVE mg/dL
Hgb urine dipstick: NEGATIVE
Ketones, ur: NEGATIVE mg/dL
LEUKOCYTES UA: NEGATIVE
Nitrite: NEGATIVE
PH: 5 (ref 5.0–8.0)
Protein, ur: NEGATIVE mg/dL
Specific Gravity, Urine: 1.031 — ABNORMAL HIGH (ref 1.005–1.030)

## 2017-04-05 LAB — BASIC METABOLIC PANEL
ANION GAP: 4 — AB (ref 5–15)
BUN: 25 mg/dL — ABNORMAL HIGH (ref 6–20)
CALCIUM: 8.1 mg/dL — AB (ref 8.9–10.3)
CHLORIDE: 102 mmol/L (ref 101–111)
CO2: 29 mmol/L (ref 22–32)
CREATININE: 1 mg/dL (ref 0.61–1.24)
GFR calc non Af Amer: >60 mL/min (ref >60)
Glucose, Bld: 163 mg/dL — ABNORMAL HIGH (ref 65–99)
Potassium: 3.8 mmol/L (ref 3.5–5.1)
SODIUM: 135 mmol/L (ref 135–145)

## 2017-04-05 LAB — PHOSPHORUS: PHOSPHORUS: 1.8 mg/dL — AB (ref 2.5–4.6)

## 2017-04-05 LAB — TRIGLYCERIDES: TRIGLYCERIDES: 86 mg/dL (ref <150)

## 2017-04-05 LAB — MAGNESIUM: MAGNESIUM: 1.9 mg/dL (ref 1.7–2.4)

## 2017-04-05 MED ORDER — PIPERACILLIN-TAZOBACTAM 3.375 G IVPB
3.3750 g | Freq: Three times a day (TID) | INTRAVENOUS | Status: DC
  Administered 2017-04-05 – 2017-04-07 (×7): 3.375 g via INTRAVENOUS
  Filled 2017-04-05 (×8): qty 50

## 2017-04-05 MED ORDER — TRACE MINERALS CR-CU-MN-SE-ZN 10-1000-500-60 MCG/ML IV SOLN
INTRAVENOUS | Status: AC
  Administered 2017-04-05: 18:00:00 via INTRAVENOUS
  Filled 2017-04-05: qty 1920

## 2017-04-05 MED ORDER — MORPHINE SULFATE (PF) 2 MG/ML IV SOLN
2.0000 mg | INTRAVENOUS | Status: DC | PRN
Start: 2017-04-05 — End: 2017-04-13

## 2017-04-05 MED ORDER — ACETAMINOPHEN 650 MG RE SUPP
650.0000 mg | RECTAL | Status: DC | PRN
  Administered 2017-04-05: 650 mg via RECTAL
  Filled 2017-04-05: qty 1

## 2017-04-05 MED ORDER — PANTOPRAZOLE SODIUM 40 MG IV SOLR
40.0000 mg | INTRAVENOUS | Status: DC
  Administered 2017-04-05 – 2017-04-10 (×6): 40 mg via INTRAVENOUS
  Filled 2017-04-05 (×6): qty 40

## 2017-04-05 NOTE — Consult Note (Signed)
Reason for Consult: Left lower lobe infiltrate Referring Physician: Laron Mcmahon is an 70 y.o. male.  HPI: Drew Mcmahon presented with a small bowel obstruction. He underwent a formal exploration and cause was not found. He was noted on follow-up chest x-ray after central line placement that he had a potential left lower lobe infiltrate. We were consulted for further evaluation. Drew Mcmahon does not complain of any cough shortness of breath or chest pain. He's had no trouble swallowing although he's been not taking much oral intake as of late. He was intubated during last procedure but no complications were reported. He did have fever yesterday.  Past Medical History:  Diagnosis Date  . Elevated lipids   . Hip fracture ([place]) 2007  . Hyperlipidemia   . Hypertension     Past Surgical History:  Procedure Laterality Date  . APPENDECTOMY  04/03/2017   Procedure: APPENDECTOMY;  Surgeon: [name], MD;  Location: [facility] ORS;  Service: General;;  . BOWEL RESECTION  04/03/2017   Procedure: FOCAL SMALL BOWEL RESECTION;  Surgeon: [name], MD;  Location: [facility] ORS;  Service: General;;  . COLONOSCOPY WITH PROPOFOL N/A 06/12/2016   Procedure: COLONOSCOPY WITH PROPOFOL;  Surgeon: Manya Silvas, MD;  Location: [facility] ENDOSCOPY;  Service: Endoscopy;  Laterality: N/A;  . FRACTURE SURGERY Left 2007   hip  . LAPAROSCOPY N/A 04/01/2017   Procedure: LAPAROSCOPY DIAGNOSTIC;  Surgeon: [name], MD;  Location: [facility];  Service: General;  Laterality: N/A;  . LAPAROTOMY N/A 04/03/2017   Procedure: EXPLORATORY LAPAROTOMY;  Surgeon: [name], MD;  Location: [facility] ORS;  Service: General;  Laterality: N/A;  . LYSIS OF ADHESION  04/03/2017   Procedure: LYSIS OF ADHESION;  Surgeon: [name], MD;  Location: [facility] ORS;  Service: General;;    Family History  Problem Relation Age of Onset  . Hypertension Mother   . Alzheimer's disease Mother   . Hypertension Father    . Cancer Father        lung  . Hypertension Sister   . Hypertension Brother   . Hypertension Sister   . Hypertension Sister   . Hypertension Brother     Social History:  reports that he quit smoking about 31 years ago. His smoking use included Cigarettes. He has never used smokeless tobacco. He reports that he does not drink alcohol or use drugs.  Allergies: No Known Allergies  Medications: I have reviewed the patient's current medications.  Results for orders placed or performed during the hospital encounter of 03/30/17 (from the past 48 hour(s))  CBC     Status: Abnormal   Collection Time: 04/04/17  5:25 AM  Result Value Ref Range   WBC 19.5 (H) 3.8 - 10.6 K/uL   RBC 4.47 4.40 - 5.90 MIL/uL   Hemoglobin 14.0 13.0 - 18.0 g/dL   HCT 41.0 40.0 - 52.0 %   MCV 91.8 80.0 - 100.0 fL   MCH 31.3 26.0 - 34.0 pg   MCHC 34.1 32.0 - 36.0 g/dL   RDW 13.4 11.5 - 14.5 %   Platelets 240 150 - 440 K/uL  Basic metabolic panel     Status: Abnormal   Collection Time: 04/04/17  5:25 AM  Result Value Ref Range   Sodium 135 135 - 145 mmol/L   Potassium 4.6 3.5 - 5.1 mmol/L   Chloride 104 101 - 111 mmol/L   CO2 27 22 - 32 mmol/L   Glucose, Bld 145 (  H) 65 - 99 mg/dL   BUN 16 6 - 20 mg/dL   Creatinine, Ser 0.92 0.61 - 1.24 mg/dL   Calcium 7.8 (L) 8.9 - 10.3 mg/dL   GFR calc non Af Amer >60 >60 mL/min   GFR calc Af Amer >60 >60 mL/min    Comment: (NOTE) The eGFR has been calculated using the CKD EPI equation. This calculation has not been validated in all clinical situations. eGFR's persistently <60 mL/min signify possible Chronic Kidney Disease.    Anion gap 4 (L) 5 - 15  Magnesium     Status: None   Collection Time: 04/04/17  5:25 AM  Result Value Ref Range   Magnesium 1.7 1.7 - 2.4 mg/dL  Phosphorus     Status: None   Collection Time: 04/04/17  5:25 AM  Result Value Ref Range   Phosphorus 4.0 2.5 - 4.6 mg/dL  Hepatic function panel     Status: Abnormal   Collection Time: 04/04/17   5:25 AM  Result Value Ref Range   Total Protein 5.2 (L) 6.5 - 8.1 g/dL   Albumin 2.5 (L) 3.5 - 5.0 g/dL   AST 23 15 - 41 U/L   ALT 46 17 - 63 U/L   Alkaline Phosphatase 54 38 - 126 U/L   Total Bilirubin 1.5 (H) 0.3 - 1.2 mg/dL   Bilirubin, Direct 0.5 0.1 - 0.5 mg/dL   Indirect Bilirubin 1.0 (H) 0.3 - 0.9 mg/dL  Glucose, capillary     Status: Abnormal   Collection Time: 04/04/17 11:36 AM  Result Value Ref Range   Glucose-Capillary 109 (H) 65 - 99 mg/dL   Comment 1 Notify RN   Glucose, capillary     Status: Abnormal   Collection Time: 04/04/17  4:21 PM  Result Value Ref Range   Glucose-Capillary 128 (H) 65 - 99 mg/dL   Comment 1 Notify RN   Glucose, capillary     Status: Abnormal   Collection Time: 04/04/17 11:32 PM  Result Value Ref Range   Glucose-Capillary 166 (H) 65 - 99 mg/dL  Glucose, capillary     Status: Abnormal   Collection Time: 04/05/17  5:20 AM  Result Value Ref Range   Glucose-Capillary 154 (H) 65 - 99 mg/dL  CBC     Status: Abnormal   Collection Time: 04/05/17  6:25 AM  Result Value Ref Range   WBC 27.5 (H) 3.8 - 10.6 K/uL   RBC 3.94 (L) 4.40 - 5.90 MIL/uL   Hemoglobin 12.5 (L) 13.0 - 18.0 g/dL   HCT 35.9 (L) 40.0 - 52.0 %   MCV 91.0 80.0 - 100.0 fL   MCH 31.7 26.0 - 34.0 pg   MCHC 34.8 32.0 - 36.0 g/dL   RDW 13.4 11.5 - 14.5 %   Platelets 184 150 - 440 K/uL  Basic metabolic panel     Status: Abnormal   Collection Time: 04/05/17  6:25 AM  Result Value Ref Range   Sodium 135 135 - 145 mmol/L   Potassium 3.8 3.5 - 5.1 mmol/L   Chloride 102 101 - 111 mmol/L   CO2 29 22 - 32 mmol/L   Glucose, Bld 163 (H) 65 - 99 mg/dL   BUN 25 (H) 6 - 20 mg/dL   Creatinine, Ser 1.00 0.61 - 1.24 mg/dL   Calcium 8.1 (L) 8.9 - 10.3 mg/dL   GFR calc non Af Amer >60 >60 mL/min   GFR calc Af Amer >60 >60 mL/min    Comment: (NOTE) The eGFR  has been calculated using the CKD EPI equation. This calculation has not been validated in all clinical situations. eGFR's persistently  <60 mL/min signify possible Chronic Kidney Disease.    Anion gap 4 (L) 5 - 15  Magnesium     Status: None   Collection Time: 04/05/17  6:25 AM  Result Value Ref Range   Magnesium 1.9 1.7 - 2.4 mg/dL  Phosphorus     Status: Abnormal   Collection Time: 04/05/17  6:25 AM  Result Value Ref Range   Phosphorus 1.8 (L) 2.5 - 4.6 mg/dL  Triglycerides     Status: None   Collection Time: 04/05/17  6:25 AM  Result Value Ref Range   Triglycerides 86 <150 mg/dL  Glucose, capillary     Status: Abnormal   Collection Time: 04/05/17  7:23 AM  Result Value Ref Range   Glucose-Capillary 161 (H) 65 - 99 mg/dL   Comment 1 Notify RN     Dg Chest Port 1 View  Result Date: 04/05/2017 CLINICAL DATA:  Leukocytosis EXAM: PORTABLE CHEST 1 VIEW COMPARISON:  April 04, 2017 FINDINGS: Central catheter tip is in the superior vena cava, unchanged. No pneumothorax. There is patchy airspace consolidation in the left base with small left pleural effusion. Lungs elsewhere clear. Heart is mildly enlarged with pulmonary vascularity within normal limits. No adenopathy. No bone lesions. IMPRESSION: Patchy infiltrate consistent with pneumonia or possibly aspiration left base with small left pleural effusion. Lungs elsewhere clear. Stable cardiac prominence. Central catheter tip in superior vena cava, unchanged. No pneumothorax. Followup PA and lateral chest radiographs recommended in 3-4 weeks following trial of antibiotic therapy to ensure resolution and exclude underlying malignancy. Electronically Signed   By: Lowella Grip III M.D.   On: 04/05/2017 09:02   Dg Chest Port 1 View  Result Date: 04/04/2017 CLINICAL DATA:  Central catheter placement. EXAM: PORTABLE CHEST 1 VIEW COMPARISON:  Mar 10, 2006 FINDINGS: Central catheter tip is in the superior vena cava. No pneumothorax. There is airspace consolidation in the left base with minimal left pleural effusion. Lungs elsewhere clear. Heart is upper normal in size with pulmonary  vascularity normal. No adenopathy. No bone lesions. IMPRESSION: Airspace consolidation left base. Question pneumonia versus aspiration. Minimal left pleural effusion. Lungs elsewhere clear. Stable cardiac silhouette. Central catheter tip in superior vena cava without pneumothorax. Followup PA and lateral chest radiographs recommended in 3-4 weeks following trial of antibiotic therapy to ensure resolution and exclude underlying malignancy. These results will be called to the ordering clinician or representative by the Radiologist Assistant, and communication documented in the PACS or zVision Dashboard. Electronically Signed   By: Lowella Grip III M.D.   On: 04/04/2017 08:24    Review of Systems  Constitutional: Positive for fever.  HENT: Negative for hearing loss.   Eyes: Negative for blurred vision.  Respiratory: Negative for sputum production and wheezing.   Cardiovascular: Negative for chest pain.  Gastrointestinal: Positive for constipation and nausea. Negative for vomiting.  Genitourinary: Negative for dysuria.  Musculoskeletal: Negative for joint pain.  Skin: Negative for rash.  Neurological: Negative for dizziness.   Blood pressure (!) 140/47, pulse 83, temperature 98.6 F (37 C), temperature source Oral, resp. rate 16, height _0  (1.854 m), weight 100.7 kg (222 lb), SpO2 100 %. Physical Exam  Constitutional: He is oriented to person, place, and time. He appears well-developed and well-nourished. No distress.  HENT:  Head: Normocephalic and atraumatic.  Mouth/Throat: Oropharynx is clear and moist. No oropharyngeal exudate.  Eyes: Conjunctivae are normal. Pupils are equal, round, and reactive to light. No scleral icterus.  Neck: Neck supple. No JVD present. No tracheal deviation present. No thyromegaly present.  Cardiovascular: Normal rate and regular rhythm.   No murmur heard. Respiratory: Effort normal and breath sounds normal. No respiratory distress.  Lungs appear to be  clear to auscultation. Slightly decreased breath sounds left base.  GI: Soft.  Bowel sounds diminished. No tenderness or masses.  Musculoskeletal: Normal range of motion. He exhibits no edema or tenderness.  Lymphadenopathy:    He has no cervical adenopathy.  Neurological: He is alert and oriented to person, place, and time. No cranial nerve deficit.  Skin: Skin is warm and dry.    Assessment/Plan: 1. Pneumonia. Patient's chest x-ray is not impressive. There is a slight area in the left base. He's had no overt symptoms. He has had fever yesterday and his white count has increased. Difficult to separate out if this is from the abdominal issues or a new pneumonia. However with his current condition inserted puts him at increased risk of pneumonia. At this point we'll go ahead and empirically treat him with Zosyn. We'll get a follow-up chest x-ray in the morning and try to get a PA and lateral to further discern this. Have encouraged continued use of incentive spirometry. 2. Small bowel obstruction. Again he is undergone formal exploration by surgery. We'll defer management to them. 3. Leukocytosis. This could be from the left lower lobe infiltrate however he does have abdominal issues that could account for leukocytosis. We'll monitor this while he is on antibiotics. 5. Hypertension. We'll continue his home medications for control.  Total time spent 40 minutes  Baxter Hire 04/05/2017, 10:50 AM

## 2017-04-05 NOTE — Progress Notes (Signed)
Notified Dr. Bary Castilla of pt temp of 101.4. Dr acknowledged, stated would put in orders

## 2017-04-05 NOTE — Progress Notes (Signed)
Nutrition Follow-up  DOCUMENTATION CODES:   Severe malnutrition in context of acute illness/injury, Obesity unspecified  INTERVENTION:  Continue TPN per Dr. Byrnett. Patient currently receiving Clinimix E 5/15 @ 80 ml/hr, which provides 1363.2 kcal (62% minimum estimated kcal needs), 96 grams of protein (96% minimum estimated protein needs), 1920 ml fluid daily.  To best meet patient's calorie and protein needs would recommend goal regimen of Clinimix E 5/20 @ 83 ml/hr + 20% ILE @ 20 ml/hr over 12 hours daily. Would provide 2233 kcal, 99.6 grams of protein, 1992 ml fluid daily.   Recommend supplementing phosphorus until it is within normal limits. There is increased usage of phosphorus with provision of dextrose and will continue to stay low without supplementation. Would not recommend advancing to goal TPN rate until phosphorus is >2 mg/dL.  As patient is NPO on TPN recommend updating Novolog correction order to Q6hrs instead of TID with meals to better manage CBGs. Order already in place to monitor CBGs Q6hrs.  Recommend ordering daily weights for patients on TPN.  NUTRITION DIAGNOSIS:   Malnutrition (Severe) related to acute illness (partial small bowel obstruction) as evidenced by energy intake < or equal to 50% for > or equal to 5 days, 3.9 percent weight loss over 1 week, moderate depletion of body fat.  Ongoing.  GOAL:   Patient will meet greater than or equal to 90% of their needs  Not met.  MONITOR:   Diet advancement, Labs, Weight trends, I & O's  REASON FOR ASSESSMENT:   Malnutrition Screening Tool    ASSESSMENT:   71 year old male with PMHx of HLD, HTN presented with vomiting, bloating, and abdominal pain found to have partial SBO.  -Patient s/p exploratory laparotomy, lysis of adhesions, appendectomy, focal small bowel resection (12 cm removed), and gastrostomy placement for decompression on 04/03/2017.   RD received consult for new TPN.  Spoke with patient's  wife at bedside as patient was resting. She reports patient was dizzy and clammy yesterday. Has not tried to ambulate yet today. He is having nausea and indigestion today. At time of RD assessment approximately 150 ml output from G-tube.  Access: PICC double lumen placed 04/03/2017  TPN being managed by Dr. Byrnett. Initiated TPN on 6/9 with Clinimix E 5/20 at 50 ml/hr and 20% ILE @ 20 ml/hr over 12 hours. Per order lipids will only run on Tuesday and Saturdays (total of 480 ml 20% ILE, 96 grams lipids per week, 960 kcal per week). Current TPN order of Clinimix E 5/20 @ 50 ml/hr was discontinued today at 1231 (did not receive full 24 hrs of 1/2 rate) and was changed to Clinimix E 5/15 @ 80 ml/hr. Current TPN provides 1363.2 kcal (62% minimum estimated kcal needs), 96 grams of protein (96% minimum estimated protein needs), 1920 ml fluid daily. If you consider calories from lipids split over 7 days, still only providing 1500 kcal per day.   Medications reviewed and include: Novolog 0-15 units TID with meals (received 8 units past 24 hrs), pantoprazole, LR @ 20 ml/hr, Zosyn, Phenergan PRN.  Labs reviewed: CBG 128-166 past 24 hrs, BUN 25, Phosphorus 1.8. Potassium and Magnesium WNL.   I/O 0700 6/9 to 0700 6/10: 1.1 L UOP (0.5 ml/kg/hr) - not quite adequate, 500 ml output from gastrostomy tube to gravity drainage  No subsequent weights since initial assessment on 6/6 to trend. Weight from 6/8 pulled from 6/6.   Diet Order:  Diet NPO time specified Except for: Sips with Meds .  TPN (CLINIMIX-E) Adult  Skin:  Wound (see comment) (closed incisions on abdomen)  Last BM:  04/01/2017 - type 7  Height:   Ht Readings from Last 1 Encounters:  04/03/17 6' 1" (1.854 m)    Weight:   Wt Readings from Last 1 Encounters:  04/03/17 222 lb (100.7 kg)    Ideal Body Weight:  83.6 kg  BMI:  Body mass index is 29.29 kg/m.  Estimated Nutritional Needs:   Kcal:  2195-2375 (MSJ x 1.2-1.3)  Protein:  100-120  grams (1-1.2 grams/kg)  Fluid:  2.2-2.4 L/day  EDUCATION NEEDS:   No education needs identified at this time   Stephens, MS, RD, LDN Pager: [phone] After Hours Pager: [phone]  

## 2017-04-05 NOTE — Progress Notes (Addendum)
Afebrile, VSS. Sats fine. (100% on 2L). No SOB. Insprirex at 2000. Lungs: Clear. Cardio:RR. ABD: Distended, soft, non-tender. Wound dressing: Dry. Calves: Soft. Labs: WBC up to 27K, diff pending. HGB down appropriately at 12.5. Lytes: OK. BS: 160's. No flatus. Good u/o. Woozy with standing yesterday, will try again today. IMP: Doing well s/p exploration. WBC unexplained on clinical exam. Will repeat CXR (Doubt pneumonia w/o fever/ tachycardia/ SOB) and urine (was catheterized.).  Plan: Increase TPN to 80, decrease LR to 20. Trial ambulation. With minimal PCA use, will change to prn MS and d/c PCA.   CXR results reviewed. Will ask for medical evaluation re: RX vs observe.    Review of chart shows temp yesterday at noon.  No notification.  IV tylenol was re-ordered yesterday as part of his pain management.  This was not continued based on review of the [facility].  Will use tylenol PR pending resumption of PO.  Dr. Tillman Sers note appreciated.

## 2017-04-05 NOTE — Progress Notes (Signed)
Pharmacy Antibiotic Note  Drew Mcmahon is a 71 y.o. male admitted on 03/30/2017 with IAI.  Pharmacy has been consulted for Zosyn dosing.  Plan: Zosyn 3.375 gm IV Q8H EI  Height: 6\' 1"  (185.4 cm) Weight: 222 lb (100.7 kg) IBW/kg (Calculated) : 79.9  Temp (24hrs), Avg:98.8 F (37.1 C), Min:97.7 F (36.5 C), Max:101.5 F (38.6 C)   Recent Labs Lab 03/30/17 0843 03/30/17 1724 04/01/17 2051 04/02/17 0515 04/04/17 0525 04/05/17 0625  WBC 7.7  --  13.1* 11.7* 19.5* 27.5*  CREATININE  --  0.98 0.92 0.86 0.92 1.00    Estimated Creatinine Clearance: 85.8 mL/min (by C-G formula based on SCr of 1 mg/dL).    No Known Allergies  Thank you for allowing pharmacy to be a part of this patient's care.  Laural Benes, Pharm.D., BCPS Clinical Pharmacist 04/05/2017 10:57 AM

## 2017-04-06 ENCOUNTER — Inpatient Hospital Stay: Payer: Medicare Other

## 2017-04-06 LAB — EXPECTORATED SPUTUM ASSESSMENT W REFEX TO RESP CULTURE

## 2017-04-06 LAB — CBC WITH DIFFERENTIAL/PLATELET
BASOS ABS: 0 10*3/uL (ref 0–0.1)
BASOS PCT: 0 %
Eosinophils Absolute: 0.1 10*3/uL (ref 0–0.7)
Eosinophils Relative: 1 %
HEMATOCRIT: 34.1 % — AB (ref 40.0–52.0)
HEMOGLOBIN: 11.7 g/dL — AB (ref 13.0–18.0)
Lymphocytes Relative: 6 %
Lymphs Abs: 1.3 10*3/uL (ref 1.0–3.6)
MCH: 31.2 pg (ref 26.0–34.0)
MCHC: 34.4 g/dL (ref 32.0–36.0)
MCV: 90.9 fL (ref 80.0–100.0)
MONOS PCT: 6 %
Monocytes Absolute: 1.2 10*3/uL — ABNORMAL HIGH (ref 0.2–1.0)
NEUTROS ABS: 17.8 10*3/uL — AB (ref 1.4–6.5)
NEUTROS PCT: 87 %
Platelets: 179 10*3/uL (ref 150–440)
RBC: 3.76 MIL/uL — AB (ref 4.40–5.90)
RDW: 13.2 % (ref 11.5–14.5)
WBC: 20.4 10*3/uL — ABNORMAL HIGH (ref 3.8–10.6)

## 2017-04-06 LAB — COMPREHENSIVE METABOLIC PANEL
ALBUMIN: 2.1 g/dL — AB (ref 3.5–5.0)
ALT: 22 U/L (ref 17–63)
AST: 15 U/L (ref 15–41)
Alkaline Phosphatase: 61 U/L (ref 38–126)
Anion gap: 3 — ABNORMAL LOW (ref 5–15)
BUN: 22 mg/dL — AB (ref 6–20)
CHLORIDE: 103 mmol/L (ref 101–111)
CO2: 29 mmol/L (ref 22–32)
CREATININE: 0.82 mg/dL (ref 0.61–1.24)
Calcium: 8 mg/dL — ABNORMAL LOW (ref 8.9–10.3)
GFR calc Af Amer: >60 mL/min (ref >60)
GFR calc non Af Amer: >60 mL/min (ref >60)
Glucose, Bld: 153 mg/dL — ABNORMAL HIGH (ref 65–99)
POTASSIUM: 3.6 mmol/L (ref 3.5–5.1)
SODIUM: 135 mmol/L (ref 135–145)
Total Bilirubin: 0.8 mg/dL (ref 0.3–1.2)
Total Protein: 5.3 g/dL — ABNORMAL LOW (ref 6.5–8.1)

## 2017-04-06 LAB — GLUCOSE, CAPILLARY
GLUCOSE-CAPILLARY: 135 mg/dL — AB (ref 65–99)
Glucose-Capillary: 153 mg/dL — ABNORMAL HIGH (ref 65–99)
Glucose-Capillary: 157 mg/dL — ABNORMAL HIGH (ref 65–99)
Glucose-Capillary: 158 mg/dL — ABNORMAL HIGH (ref 65–99)
Glucose-Capillary: 177 mg/dL — ABNORMAL HIGH (ref 65–99)

## 2017-04-06 LAB — MAGNESIUM: MAGNESIUM: 1.9 mg/dL (ref 1.7–2.4)

## 2017-04-06 LAB — EXPECTORATED SPUTUM ASSESSMENT W GRAM STAIN, RFLX TO RESP C

## 2017-04-06 LAB — PHOSPHORUS: Phosphorus: 1.8 mg/dL — ABNORMAL LOW (ref 2.5–4.6)

## 2017-04-06 LAB — PREALBUMIN

## 2017-04-06 MED ORDER — TRACE MINERALS CR-CU-MN-SE-ZN 10-1000-500-60 MCG/ML IV SOLN
INTRAVENOUS | Status: AC
  Administered 2017-04-06: 18:00:00 via INTRAVENOUS
  Filled 2017-04-06: qty 1920

## 2017-04-06 MED ORDER — HYDROCOD POLST-CPM POLST ER 10-8 MG/5ML PO SUER
5.0000 mL | Freq: Two times a day (BID) | ORAL | Status: DC
  Administered 2017-04-06 – 2017-04-13 (×11): 5 mL via ORAL
  Filled 2017-04-06 (×17): qty 5

## 2017-04-06 MED ORDER — LACTATED RINGERS IV BOLUS (SEPSIS)
500.0000 mL | Freq: Once | INTRAVENOUS | Status: AC
  Administered 2017-04-06: 500 mL via INTRAVENOUS

## 2017-04-06 MED ORDER — GUAIFENESIN ER 600 MG PO TB12
600.0000 mg | ORAL_TABLET | Freq: Two times a day (BID) | ORAL | Status: DC
  Administered 2017-04-06 – 2017-04-13 (×16): 600 mg via ORAL
  Filled 2017-04-06 (×17): qty 1

## 2017-04-06 MED ORDER — INSULIN ASPART 100 UNIT/ML ~~LOC~~ SOLN
0.0000 [IU] | Freq: Four times a day (QID) | SUBCUTANEOUS | Status: DC
  Administered 2017-04-06: 3 [IU] via SUBCUTANEOUS
  Administered 2017-04-06: 2 [IU] via SUBCUTANEOUS
  Administered 2017-04-07 (×2): 3 [IU] via SUBCUTANEOUS
  Administered 2017-04-07: 2 [IU] via SUBCUTANEOUS
  Administered 2017-04-07 – 2017-04-08 (×2): 3 [IU] via SUBCUTANEOUS
  Administered 2017-04-08: 5 [IU] via SUBCUTANEOUS
  Administered 2017-04-08 – 2017-04-09 (×4): 3 [IU] via SUBCUTANEOUS
  Administered 2017-04-09: 2 [IU] via SUBCUTANEOUS
  Administered 2017-04-09 – 2017-04-10 (×3): 3 [IU] via SUBCUTANEOUS
  Administered 2017-04-10: 2 [IU] via SUBCUTANEOUS
  Administered 2017-04-10 – 2017-04-11 (×3): 3 [IU] via SUBCUTANEOUS
  Administered 2017-04-11: 2 [IU] via SUBCUTANEOUS
  Administered 2017-04-11 – 2017-04-12 (×2): 3 [IU] via SUBCUTANEOUS
  Administered 2017-04-12 (×2): 2 [IU] via SUBCUTANEOUS
  Administered 2017-04-12: 3 [IU] via SUBCUTANEOUS
  Administered 2017-04-13: 2 [IU] via SUBCUTANEOUS
  Filled 2017-04-06 (×2): qty 1
  Filled 2017-04-06: qty 5
  Filled 2017-04-06 (×2): qty 3
  Filled 2017-04-06 (×4): qty 1
  Filled 2017-04-06: qty 3
  Filled 2017-04-06 (×3): qty 1
  Filled 2017-04-06: qty 3
  Filled 2017-04-06 (×3): qty 1
  Filled 2017-04-06 (×2): qty 3
  Filled 2017-04-06: qty 2
  Filled 2017-04-06 (×6): qty 1
  Filled 2017-04-06: qty 2
  Filled 2017-04-06: qty 1

## 2017-04-06 NOTE — Progress Notes (Signed)
[place] [facility] [place] NAME: Drew Mcmahon    MR#:  [id number]  DATE OF BIRTH:  Feb 14, 1946  SUBJECTIVE:  CHIEF COMPLAINT:  No chief complaint on file.  The patient is 71 year old Caucasian male with past medical history significant for history of hypertension, hyperlipidemia, who was diagnosed with small bowel obstruction and underwent a exploratory laparotomy, lysis of adhesions, appendectomy, focal small bowel resection, gastrostomy placement by Dr. Bary Castilla 04/03/2017. Postoperatively, patient was noted to have cough, chest x-ray revealed a left lower lobe infiltrate, patient admits of some minimal phlegm production. He complains of pain with cough. Denies any shortness of breath. Admits to nausea. He was noted to have fever 04/04/2017 and leukocytosis, and was initiated on Zosyn for suspected aspiration pneumonitis. He feels satisfactory today, admits of some phlegm production, cough bothers him. Due to significant abdominal pain associated with cough. The patient is lightheaded and dizzy whenever he gets up, not as bad as it was in the past, however, still significant discomfort. Not feeling presyncopal  Review of Systems  Constitutional: Negative for chills, fever and weight loss.  HENT: Negative for congestion.   Eyes: Negative for blurred vision and double vision.  Respiratory: Positive for cough and sputum production. Negative for shortness of breath and wheezing.   Cardiovascular: Negative for chest pain, palpitations, orthopnea, leg swelling and PND.  Gastrointestinal: Positive for abdominal pain and nausea. Negative for blood in stool, constipation, diarrhea and vomiting.  Genitourinary: Negative for dysuria, frequency, hematuria and urgency.  Musculoskeletal: Negative for falls.  Neurological: Positive for dizziness. Negative for tremors, focal weakness and headaches.  Endo/Heme/Allergies: Does not bruise/bleed easily.    Psychiatric/Behavioral: Negative for depression. The patient does not have insomnia.     VITAL SIGNS: Blood pressure (!) 144/62, pulse 92, temperature 98.2 F (36.8 C), temperature source Oral, resp. rate 18, height 6\' 1"  (1.854 m), weight 100.7 kg (222 lb), SpO2 94 %.  PHYSICAL EXAMINATION:   GENERAL:  71 y.o.-year-old patient lying in the bed with no acute distress.  EYES: Pupils equal, round, reactive to light and accommodation. No scleral icterus. Extraocular muscles intact.  HEENT: Head atraumatic, normocephalic. Oropharynx and nasopharynx clear.  NECK:  Supple, no jugular venous distention. No thyroid enlargement, no tenderness.  LUNGS: Normal breath sounds bilaterally, no wheezing, rales,rhonchi or crepitation. No use of accessory muscles of respiration.  CARDIOVASCULAR: S1, S2 normal. No murmurs, rubs, or gallops.  ABDOMEN: Soft, mild tender on palpation, midline scar healing, drainage nontender, distended. Bowel sounds present, diminished on the left. No organomegaly or mass.  EXTREMITIES: No pedal edema, cyanosis, or clubbing.  NEUROLOGIC: Cranial nerves II through XII are intact. Muscle strength 5/5 in all extremities. Sensation intact. Gait not checked.  PSYCHIATRIC: The patient is alert and oriented x 3.  SKIN: No obvious rash, lesion, or ulcer.   ORDERS/RESULTS REVIEWED:   CBC  Recent Labs Lab 04/01/17 2051 04/02/17 0515 04/04/17 0525 04/05/17 0625 04/06/17 0428  WBC 13.1* 11.7* 19.5* 27.5* 20.4*  HGB 14.4 13.6 14.0 12.5* 11.7*  HCT 41.9 39.8* 41.0 35.9* 34.1*  PLT 215 212 240 184 179  MCV 91.9 90.3 91.8 91.0 90.9  MCH 31.5 30.9 31.3 31.7 31.2  MCHC 34.2 34.2 34.1 34.8 34.4  RDW 13.1 13.1 13.4 13.4 13.2  LYMPHSABS  --   --   --   --  1.3  MONOABS  --   --   --   --  1.2*  EOSABS  --   --   --   --  0.1  BASOSABS  --   --   --   --  0.0    ------------------------------------------------------------------------------------------------------------------  Chemistries   Recent Labs Lab 03/30/17 1724 04/01/17 2051 04/02/17 0515 04/04/17 0525 04/05/17 0625 04/06/17 0428  NA 137  --  142 135 135 135  K 3.5  --  4.6 4.6 3.8 3.6  CL 97*  --  111 104 102 103  CO2 29  --  27 27 29 29   GLUCOSE 128*  --  136* 145* 163* 153*  BUN 15  --  14 16 25* 22*  CREATININE 0.98 0.92 0.86 0.92 1.00 0.82  CALCIUM 9.0  --  8.3* 7.8* 8.1* 8.0*  MG  --   --   --  1.7 1.9 1.9  AST  --   --   --  23  --  15  ALT  --   --   --  46  --  22  ALKPHOS  --   --   --  54  --  61  BILITOT  --   --   --  1.5*  --  0.8   ------------------------------------------------------------------------------------------------------------------ estimated creatinine clearance is 104.6 mL/min (by C-G formula based on SCr of 0.82 mg/dL). ------------------------------------------------------------------------------------------------------------------ No results for input(s): TSH, T4TOTAL, T3FREE, THYROIDAB in the last 72 hours.  Invalid input(s): FREET3  Cardiac Enzymes No results for input(s): CKMB, TROPONINI, MYOGLOBIN in the last 168 hours.  Invalid input(s): CK ------------------------------------------------------------------------------------------------------------------ Invalid input(s): POCBNP ---------------------------------------------------------------------------------------------------------------  RADIOLOGY: Dg Chest 2 View  Result Date: 04/06/2017 CLINICAL DATA:  Recent appendectomy.  Pain EXAM: CHEST  2 VIEW COMPARISON:  April 05, 2017 FINDINGS: There is persistent patchy atelectatic change in the left base. There is milder atelectasis in the medial right base. Lungs elsewhere clear. Heart is upper normal in size with pulmonary vascularity within normal limits. Central catheter tip is in the superior vena cava. No pneumothorax. No bone  lesions. No adenopathy. IMPRESSION: Bibasilar atelectasis, more on the left than on the right. No frank airspace consolidation is evident. Stable cardiac silhouette. No pneumothorax. Electronically Signed   By: Lowella Grip III M.D.   On: 04/06/2017 07:45   Dg Chest Port 1 View  Result Date: 04/05/2017 CLINICAL DATA:  Leukocytosis EXAM: PORTABLE CHEST 1 VIEW COMPARISON:  April 04, 2017 FINDINGS: Central catheter tip is in the superior vena cava, unchanged. No pneumothorax. There is patchy airspace consolidation in the left base with small left pleural effusion. Lungs elsewhere clear. Heart is mildly enlarged with pulmonary vascularity within normal limits. No adenopathy. No bone lesions. IMPRESSION: Patchy infiltrate consistent with pneumonia or possibly aspiration left base with small left pleural effusion. Lungs elsewhere clear. Stable cardiac prominence. Central catheter tip in superior vena cava, unchanged. No pneumothorax. Followup PA and lateral chest radiographs recommended in 3-4 weeks following trial of antibiotic therapy to ensure resolution and exclude underlying malignancy. Electronically Signed   By: Lowella Grip III M.D.   On: 04/05/2017 09:02    EKG:  Orders placed or performed during the hospital encounter of 03/26/17  . EKG 12-Lead  . EKG 12-Lead  . ED EKG  . ED EKG  . EKG    ASSESSMENT AND PLAN:  Active Problems:   Small bowel obstruction ([place])  #1. Left lower lobe pneumonia, suspected aspiration pneumonitis, continue Zosyn. Will get sputum cultures if possible, cough suppression with Tussionex. Discussed with Dr. Bary Castilla #2. Leukocytosis, improving #3, lightheadedness, dizziness, likely intravascular depletion, patient is given IV bolus, continue IV  fluids, get orthostatic vital signs, if possible, although patient's blood pressure while standing was about the same as whenever he was laying in bed. #4. Small bowel obstruction, status post exploratory laparotomy,  patient is nothing by mouth, just ice chips, no flatness, diet as per surgery, continue TPN  Management plans discussed with the patient, family and they are in agreement.   DRUG ALLERGIES: No Known Allergies  CODE STATUS:     Code Status Orders        Start     Ordered   04/03/17 1733  Full code  Continuous     04/03/17 1732    Code Status History    Date Active Date Inactive Code Status Order ID Comments User Context   04/03/2017  5:32 PM 04/03/2017  5:32 PM Full Code [id number]  [name], MD Inpatient   04/01/2017  8:04 PM 04/03/2017  5:32 PM Full Code [id number]  [name], MD Inpatient   03/30/2017  4:32 PM 04/01/2017  8:04 PM Full Code [id number]  Byrnett, Forest Gleason, MD Inpatient      TOTAL TIME TAKING CARE OF THIS PATIENT: 40 minutes.    Theodoro Grist M.D on 04/06/2017 at 10:45 AM  Between 7am to 6pm - Pager - [phone]  After 6pm go to [email or web address] - password [email or web address] [facility]  Office  [phone]  CC: Primary care physician; Guadalupe Maple, MD

## 2017-04-06 NOTE — Progress Notes (Signed)
Patient was unable to ambulate in hallway due to dizziness and lightheadedness.  Patient did step in place and sat in the chair for a period of time last night.  Drew Mcmahon  04/06/2017  6:33 AM

## 2017-04-06 NOTE — Plan of Care (Signed)
Problem: Activity: Goal: Risk for activity intolerance will decrease Outcome: Progressing Pt was able to tolerate 2 laps around nurses station x2.

## 2017-04-06 NOTE — Progress Notes (Signed)
Tmax 101.4, VSS. GT: 400. U/O 1200. BS < 170. Light headed when getting up. Sat in chair yesterday. No cough. No abdominal pain. No flatus.  Lungs: Clear. Cardio: RR. ABD: Distended,soft, few BS+. Calves: Soft. Wound: Clean.  Labs: U/A clear, SG: 10.31. WBC down to 20K, 87% polys. HGB 11.7, down from 12.5. Lytes fine, modest increase in BUN, creat normal.   IMP: Possible intravascular depletion based on UA SG. Plan: Fluid bolus, see if he tolerates ambulation. Continue Zosyn. Recheck CBC in AM.

## 2017-04-06 NOTE — Care Management (Signed)
Barrier- Exploratory lap 6/8, TPN, Elevated wbc- concern for aspiration pneumonitis IV antibiotics

## 2017-04-07 LAB — CBC WITH DIFFERENTIAL/PLATELET
BASOS ABS: 0 10*3/uL (ref 0–0.1)
Basophils Relative: 0 %
EOS PCT: 3 %
Eosinophils Absolute: 0.3 10*3/uL (ref 0–0.7)
HEMATOCRIT: 33.7 % — AB (ref 40.0–52.0)
Hemoglobin: 11.6 g/dL — ABNORMAL LOW (ref 13.0–18.0)
LYMPHS ABS: 1.1 10*3/uL (ref 1.0–3.6)
LYMPHS PCT: 11 %
MCH: 31 pg (ref 26.0–34.0)
MCHC: 34.3 g/dL (ref 32.0–36.0)
MCV: 90.4 fL (ref 80.0–100.0)
MONO ABS: 1.1 10*3/uL — AB (ref 0.2–1.0)
MONOS PCT: 11 %
NEUTROS ABS: 7.7 10*3/uL — AB (ref 1.4–6.5)
Neutrophils Relative %: 75 %
PLATELETS: 206 10*3/uL (ref 150–440)
RBC: 3.73 MIL/uL — ABNORMAL LOW (ref 4.40–5.90)
RDW: 13.3 % (ref 11.5–14.5)
WBC: 10.3 10*3/uL (ref 3.8–10.6)

## 2017-04-07 LAB — URINE CULTURE: Culture: >=100000 — AB

## 2017-04-07 LAB — GLUCOSE, CAPILLARY
GLUCOSE-CAPILLARY: 160 mg/dL — AB (ref 65–99)
GLUCOSE-CAPILLARY: 174 mg/dL — AB (ref 65–99)
Glucose-Capillary: 168 mg/dL — ABNORMAL HIGH (ref 65–99)

## 2017-04-07 LAB — SURGICAL PATHOLOGY

## 2017-04-07 MED ORDER — FINASTERIDE 5 MG PO TABS
5.0000 mg | ORAL_TABLET | Freq: Every day | ORAL | Status: DC
  Administered 2017-04-07 – 2017-04-14 (×8): 5 mg via ORAL
  Filled 2017-04-07 (×8): qty 1

## 2017-04-07 MED ORDER — TRACE MINERALS CR-CU-MN-SE-ZN 10-1000-500-60 MCG/ML IV SOLN
INTRAVENOUS | Status: AC
  Administered 2017-04-07: 18:00:00 via INTRAVENOUS
  Filled 2017-04-07: qty 2000

## 2017-04-07 MED ORDER — SODIUM CHLORIDE 0.9 % IV SOLN
3.0000 g | Freq: Four times a day (QID) | INTRAVENOUS | Status: DC
  Administered 2017-04-07 – 2017-04-10 (×11): 3 g via INTRAVENOUS
  Filled 2017-04-07 (×14): qty 3

## 2017-04-07 MED ORDER — TRACE MINERALS CR-CU-MN-SE-ZN 10-1000-500-60 MCG/ML IV SOLN: INTRAVENOUS | Status: DC

## 2017-04-07 MED ORDER — FAT EMULSION 20 % IV EMUL
250.0000 mL | INTRAVENOUS | Status: AC
  Administered 2017-04-07: 250 mL via INTRAVENOUS
  Filled 2017-04-07: qty 250

## 2017-04-07 MED ORDER — TAMSULOSIN HCL 0.4 MG PO CAPS
0.4000 mg | ORAL_CAPSULE | Freq: Every day | ORAL | Status: DC
  Administered 2017-04-07 – 2017-04-14 (×8): 0.4 mg via ORAL
  Filled 2017-04-07 (×8): qty 1

## 2017-04-07 MED ORDER — POTASSIUM PHOSPHATES 15 MMOLE/5ML IV SOLN
40.0000 meq | Freq: Two times a day (BID) | INTRAVENOUS | Status: DC
  Administered 2017-04-07 – 2017-04-08 (×3): 40 meq via INTRAVENOUS
  Filled 2017-04-07 (×5): qty 9.09

## 2017-04-07 NOTE — Progress Notes (Signed)
Nutrition Follow-up  DOCUMENTATION CODES:   Severe malnutrition in context of acute illness/injury, Obesity unspecified  INTERVENTION:   Continue TPN per Dr. Bary Castilla. Patient currently receiving Clinimix E 5/15 @ 85 ml/hr  20% lipid emulsion at 52ml/hr x 12 hrs  Regimen provides 1928 kcal (88% minimum estimated kcal needs), 102 grams of protein (102% minimum estimated protein needs), 2040 ml fluid daily.  Recommend supplementing phosphorus until it is within normal limits.   Recommend daily labs and weights  NUTRITION DIAGNOSIS:   Malnutrition (Severe) related to acute illness (partial small bowel obstruction) as evidenced by energy intake < or equal to 50% for > or equal to 5 days, 3.9 percent weight loss over 1 week, moderate depletion of body fat.  Ongoing.  GOAL:   Patient will meet greater than or equal to 90% of their needs  MONITOR:   Diet advancement, Labs, Weight trends, I & O's    ASSESSMENT:   71 year old male with PMHx of HLD, HTN presented with vomiting, bloating, and abdominal pain found to have partial SBO.  Patient s/p exploratory laparotomy, lysis of adhesions, appendectomy, focal small bowel resection (12 cm removed), and gastrostomy placement for decompression on 04/03/2017.   Pt with UTI and possible pneumonia.   Access: PICC double lumen placed 04/03/2017  Pt continues to tolerate TPN. Phosphorus low 6/11; continue to monitor and supplement per MD discretion. Pt remains weight stable. Passing flatus.   Medications reviewed and include: lovenox, insulin, protonix, unasyn, K phoshate  Labs reviewed: BUN 22(H), Ca 8.0(L) adj. 9.52 wnl, P 1.8(L), Mg 1.9 wnl, alb 2.1(L) Prealbumin <5- labs from 6/11  Diet Order:  Diet NPO time specified Except for: Sips with Meds .TPN (CLINIMIX-E) Adult .TPN (CLINIMIX-E) Adult  Skin:  Wound (see comment) (closed incisions on abdomen)  Last BM:  6/6  Height:   Ht Readings from Last 1 Encounters:  04/03/17 6\' 1"   (1.854 m)    Weight:   Wt Readings from Last 1 Encounters:  04/06/17 224 lb 9.6 oz (101.9 kg)    Ideal Body Weight:  83.6 kg  BMI:  Body mass index is 29.63 kg/m.  Estimated Nutritional Needs:   Kcal:  8315-1761 (MSJ x 1.2-1.3)  Protein:  100-120 grams (1-1.2 grams/kg)  Fluid:  2.2-2.4 L/day  EDUCATION NEEDS:   No education needs identified at this time  Koleen Distance MS, RD, LDN Pager #- [phone]

## 2017-04-07 NOTE — Progress Notes (Signed)
AVSS. Temp resolved w/ Zosyn. Culture from urine showed enterococcus. Lungs: Clear. Inspirex at 2000. Cardio: RR. ABD: Distended, soft, BS increased. Passing flatus. Extrem: Soft. Labs reviewed. WBC: Normal. Low phos: will supplement w/ IV. Discussed TPN goals w/ dietary: No E5/ 20 available, will supplement w/ daily lipids. Discussed with pharmacist need to reorder lipids and TPN daily.  Encouraged ambulation.

## 2017-04-07 NOTE — Progress Notes (Signed)
[place] at [place] NAME: Drew Mcmahon    MR#:  [id number]  DATE OF BIRTH:  February 16, 1946  SUBJECTIVE:  CHIEF COMPLAINT:  No chief complaint on file.  The patient is 71 year old Caucasian male with past medical history significant for history of hypertension, hyperlipidemia, who was diagnosed with small bowel obstruction and underwent a exploratory laparotomy, lysis of adhesions, appendectomy, focal small bowel resection, gastrostomy placement by Dr. Bary Castilla 04/03/2017. Postoperatively, patient was noted to have cough, chest x-ray revealed a left lower lobe infiltrate, patient admits of some minimal phlegm production. He complains of pain with cough. Denies any shortness of breath. Admits to nausea. He was noted to have fever 04/04/2017 and leukocytosis, and was initiated on Zosyn for suspected aspiration pneumonitis. He feels satisfactory today, admits of some phlegm production, cough bothers him. Due to significant abdominal pain associated with cough. The patient is lightheaded and dizzy whenever he gets up, not as bad as it was in the past, however, still significant discomfort. Not feeling presyncopal Patient feels comfortable today, less nausea, no cough or sputum production. Admits of some frequency of urination, inability to empty bladder. Urine cultures came back as enterococcus. Sensitivities are pending, now on Zosyn. Afebrile for the past 24 hours. White blood cell count is normal. Patient passes flatus   Review of Systems  Constitutional: Negative for chills, fever and weight loss.  HENT: Negative for congestion.   Eyes: Negative for blurred vision and double vision.  Respiratory: Negative for shortness of breath and wheezing.   Cardiovascular: Negative for chest pain, palpitations, orthopnea, leg swelling and PND.  Gastrointestinal: Negative for blood in stool, constipation, diarrhea and vomiting.  Genitourinary: Positive for  dysuria and frequency. Negative for hematuria and urgency.  Musculoskeletal: Negative for falls.  Neurological: Positive for dizziness. Negative for tremors, focal weakness and headaches.  Endo/Heme/Allergies: Does not bruise/bleed easily.  Psychiatric/Behavioral: Negative for depression. The patient does not have insomnia.     VITAL SIGNS: Blood pressure (!) 149/72, pulse 68, temperature 98.3 F (36.8 C), temperature source Oral, resp. rate 18, height 6\' 1"  (1.854 m), weight 101.9 kg (224 lb 9.6 oz), SpO2 95 %.  PHYSICAL EXAMINATION:   GENERAL:  71 y.o.-year-old patient lying in the bed with no acute distress.  EYES: Pupils equal, round, reactive to light and accommodation. No scleral icterus. Extraocular muscles intact.  HEENT: Head atraumatic, normocephalic. Oropharynx and nasopharynx clear.  NECK:  Supple, no jugular venous distention. No thyroid enlargement, no tenderness.  LUNGS: Normal breath sounds bilaterally, no wheezing, rales,rhonchi or crepitation. No use of accessory muscles of respiration.  CARDIOVASCULAR: S1, S2 normal. No murmurs, rubs, or gallops.  ABDOMEN: Soft, mild tender on palpation, midline scar healing, drainage nontender, distended. Bowel sounds present, diminished on the left. No organomegaly or mass.  EXTREMITIES: No pedal edema, cyanosis, or clubbing.  NEUROLOGIC: Cranial nerves II through XII are intact. Muscle strength 5/5 in all extremities. Sensation intact. Gait not checked.  PSYCHIATRIC: The patient is alert and oriented x 3.  SKIN: No obvious rash, lesion, or ulcer.   ORDERS/RESULTS REVIEWED:   CBC  Recent Labs Lab 04/02/17 0515 04/04/17 0525 04/05/17 0625 04/06/17 0428 04/07/17 0408  WBC 11.7* 19.5* 27.5* 20.4* 10.3  HGB 13.6 14.0 12.5* 11.7* 11.6*  HCT 39.8* 41.0 35.9* 34.1* 33.7*  PLT 212 240 184 179 206  MCV 90.3 91.8 91.0 90.9 90.4  MCH 30.9 31.3 31.7 31.2 31.0  MCHC 34.2 34.1 34.8 34.4  34.3  RDW 13.1 13.4 13.4 13.2 13.3  LYMPHSABS   --   --   --  1.3 1.1  MONOABS  --   --   --  1.2* 1.1*  EOSABS  --   --   --  0.1 0.3  BASOSABS  --   --   --  0.0 0.0   ------------------------------------------------------------------------------------------------------------------  Chemistries   Recent Labs Lab 04/01/17 2051 04/02/17 0515 04/04/17 0525 04/05/17 0625 04/06/17 0428  NA  --  142 135 135 135  K  --  4.6 4.6 3.8 3.6  CL  --  111 104 102 103  CO2  --  27 27 29 29   GLUCOSE  --  136* 145* 163* 153*  BUN  --  14 16 25* 22*  CREATININE 0.92 0.86 0.92 1.00 0.82  CALCIUM  --  8.3* 7.8* 8.1* 8.0*  MG  --   --  1.7 1.9 1.9  AST  --   --  23  --  15  ALT  --   --  46  --  22  ALKPHOS  --   --  54  --  61  BILITOT  --   --  1.5*  --  0.8   ------------------------------------------------------------------------------------------------------------------ estimated creatinine clearance is 105.2 mL/min (by C-G formula based on SCr of 0.82 mg/dL). ------------------------------------------------------------------------------------------------------------------ No results for input(s): TSH, T4TOTAL, T3FREE, THYROIDAB in the last 72 hours.  Invalid input(s): FREET3  Cardiac Enzymes No results for input(s): CKMB, TROPONINI, MYOGLOBIN in the last 168 hours.  Invalid input(s): CK ------------------------------------------------------------------------------------------------------------------ Invalid input(s): POCBNP ---------------------------------------------------------------------------------------------------------------  RADIOLOGY: Dg Chest 2 View  Result Date: 04/06/2017 CLINICAL DATA:  Recent appendectomy.  Pain EXAM: CHEST  2 VIEW COMPARISON:  April 05, 2017 FINDINGS: There is persistent patchy atelectatic change in the left base. There is milder atelectasis in the medial right base. Lungs elsewhere clear. Heart is upper normal in size with pulmonary vascularity within normal limits. Central catheter tip is in the  superior vena cava. No pneumothorax. No bone lesions. No adenopathy. IMPRESSION: Bibasilar atelectasis, more on the left than on the right. No frank airspace consolidation is evident. Stable cardiac silhouette. No pneumothorax. Electronically Signed   By: Lowella Grip III M.D.   On: 04/06/2017 07:45    EKG:  Orders placed or performed during the hospital encounter of 03/26/17  . EKG 12-Lead  . EKG 12-Lead  . ED EKG  . ED EKG  . EKG    ASSESSMENT AND PLAN:  Active Problems:   Small bowel obstruction ([place])  #1. Left lower lobe atelectasis, no significant aspiration pneumonitis on repeated chest x-ray, continue incentive spirometer, cough suppression with Tussionex. Discussed with Dr. Bary Castilla today #2. Leukocytosis, resolved  #3, orthostatic lightheadedness, dizziness, likely intravascular depletion, patient was given IV bolus, improved #4. Small bowel obstruction, status post exploratory laparotomy, patient is nothing by mouth, starting passing flatus , diet as per surgery, continue TPN #5. Urinary retention, initiate patient on Flomax and finasteride #6. Urinary tract infection with enterococcus , may change antibiotics to ampicillin, levofloxacin, discussed with Dr. Bary Castilla  Management plans discussed with the patient, family and they are in agreement.   DRUG ALLERGIES: No Known Allergies  CODE STATUS:     Code Status Orders        Start     Ordered   04/03/17 1733  Full code  Continuous     04/03/17 1732    Code Status History    Date Active  Date Inactive Code Status Order ID Comments User Context   04/03/2017  5:32 PM 04/03/2017  5:32 PM Full Code [id number]  [name], MD Inpatient   04/01/2017  8:04 PM 04/03/2017  5:32 PM Full Code [id number]  [name], MD Inpatient   03/30/2017  4:32 PM 04/01/2017  8:04 PM Full Code [id number]  [name], MD Inpatient      TOTAL TIME TAKING CARE OF THIS PATIENT: 35 minutes.    Theodoro Grist M.D on 04/07/2017 at  12:57 PM  Between 7am to 6pm - Pager - [phone]  After 6pm go to [email or web address] - password [email or web address] [facility]  Office  [phone]  CC: Primary care physician; Guadalupe Maple, MD

## 2017-04-07 NOTE — Progress Notes (Signed)
[name] NOTE   Pharmacy Consult for TPN monitoring  Patient Measurements: Height: 6\' 1"  (185.4 cm) Weight: 224 lb 9.6 oz (101.9 kg) IBW/kg (Calculated) : 79.9 TPN AdjBW (KG): 85.1 Body mass index is 29.63 kg/m. Usual Weight:    Assessment:   GI: NPO Endo:  Insulin requirements in the past 24 hours:  Lytes: Last BMP 6/11:  K= 3.6,  Phos= 1.8,  Mag=1.9   Scr 0.82 Renal: Pulm: Cards:  Hepatobil: Neuro: ID:  Best Practices: TPN Access: TPN start date: 04/04/17   Current Nutrition:   Plan:  5/15 Clinimix E at 85 ml/hr with MVI, trace 222ml 20% lipid emulsion at 54ml/hr daily per Dr. Bary Castilla note. Lactated Ringers  at 10 ml/hr  SSI q6h ordered:  Insulin requirement during last 24 hours:  11 units Monitor TPN labs Pharmacist to order TPN and Lipid order daily per Dr. Bary Castilla.    Drew Mcmahon A 04/07/2017,1:23 PM

## 2017-04-08 ENCOUNTER — Telehealth: Payer: Self-pay | Admitting: Urology

## 2017-04-08 DIAGNOSIS — R339 Retention of urine, unspecified: Secondary | ICD-10-CM

## 2017-04-08 DIAGNOSIS — N390 Urinary tract infection, site not specified: Secondary | ICD-10-CM

## 2017-04-08 LAB — BASIC METABOLIC PANEL
Anion gap: 6 (ref 5–15)
BUN: 15 mg/dL (ref 6–20)
CHLORIDE: 104 mmol/L (ref 101–111)
CO2: 25 mmol/L (ref 22–32)
Calcium: 8 mg/dL — ABNORMAL LOW (ref 8.9–10.3)
Creatinine, Ser: 0.7 mg/dL (ref 0.61–1.24)
GFR calc Af Amer: >60 mL/min (ref >60)
GFR calc non Af Amer: >60 mL/min (ref >60)
Glucose, Bld: 182 mg/dL — ABNORMAL HIGH (ref 65–99)
POTASSIUM: 3.9 mmol/L (ref 3.5–5.1)
SODIUM: 135 mmol/L (ref 135–145)

## 2017-04-08 LAB — GLUCOSE, CAPILLARY
GLUCOSE-CAPILLARY: 175 mg/dL — AB (ref 65–99)
Glucose-Capillary: 164 mg/dL — ABNORMAL HIGH (ref 65–99)
Glucose-Capillary: 178 mg/dL — ABNORMAL HIGH (ref 65–99)
Glucose-Capillary: 190 mg/dL — ABNORMAL HIGH (ref 65–99)
Glucose-Capillary: 224 mg/dL — ABNORMAL HIGH (ref 65–99)

## 2017-04-08 LAB — MAGNESIUM: MAGNESIUM: 1.8 mg/dL (ref 1.7–2.4)

## 2017-04-08 LAB — PHOSPHORUS: PHOSPHORUS: 4.4 mg/dL (ref 2.5–4.6)

## 2017-04-08 MED ORDER — FAT EMULSION 20 % IV EMUL
250.0000 mL | INTRAVENOUS | Status: AC
  Administered 2017-04-08: 250 mL via INTRAVENOUS
  Filled 2017-04-08: qty 250

## 2017-04-08 MED ORDER — DIPHENHYDRAMINE HCL 12.5 MG/5ML PO ELIX: 12.5000 mg | ORAL_SOLUTION | Freq: Four times a day (QID) | ORAL | Status: DC | PRN

## 2017-04-08 MED ORDER — TRACE MINERALS CR-CU-MN-SE-ZN 10-1000-500-60 MCG/ML IV SOLN
INTRAVENOUS | Status: AC
  Administered 2017-04-08: 18:00:00 via INTRAVENOUS
  Filled 2017-04-08: qty 2000

## 2017-04-08 MED ORDER — OXYBUTYNIN CHLORIDE 5 MG PO TABS: 5.0000 mg | ORAL_TABLET | Freq: Three times a day (TID) | ORAL | Status: DC | PRN

## 2017-04-08 NOTE — Consult Note (Signed)
Urology Consult  I have been asked to see the patient by Dr. Bary Mcmahon, for evaluation and management of urinary retention  Chief Complaint: Urinary frequency  History of Present Illness: Drew Mcmahon is a 71 y.o. year old currently admitted with a small bowel obstruction status post laparotomy he was now recovering who developed urinary retention overnight. Specifically last night, he had increased urinary frequency, voiding "every 5 minutes"into a condom catheter. Urine output overnight was good. Bladder scan this morning was in the 300 range. Foley catheter was placed at the time of a assist the patient at lunchtime. He is currently on Flomax and finasteride started during this admission.  He also developed dysuria and ultimately grew enterococcus from a urine culture on 04/05/2017. He is currently being treated for this urinary tract infection with antibiotics.  Mr. Drew Mcmahon denies a personal history of urinary retention.  He does report that over the past year, he has had increasing urinary issues including nocturia 4-5, daytime frequency and urgency. His stream is also grown weaker. He's never been seen or evaluated by urologist. Prior to this admission, he is not taking BPH medications.  No recent PSA values for review.  Past Medical History:  Diagnosis Date  . Elevated lipids   . Hip fracture ([place]) 2007  . Hyperlipidemia   . Hypertension     Past Surgical History:  Procedure Laterality Date  . APPENDECTOMY  04/03/2017   Procedure: APPENDECTOMY;  Surgeon: Drew Bellow, MD;  Location: [facility] ORS;  Service: General;;  . BOWEL RESECTION  04/03/2017   Procedure: FOCAL SMALL BOWEL RESECTION;  Surgeon: Drew Bellow, MD;  Location: [facility] OR[facility];  Service: General;;  . COLONOSCOPY WITH PROPOFOL N/A 06/12/2016   Procedure: COLONOSCOPY WITH PROPOFOL;  Surgeon: Drew Silvas, MD;  Location: [facility] ENDOSCOPY;  Service: Endoscopy;  Laterality: N/A;  . FRACTURE SURGERY Left 2007   hip  .  LAPAROSCOPY N/A 04/01/2017   Procedure: LAPAROSCOPY DIAGNOSTIC;  Surgeon: Drew Bellow, MD;  Location: [facility];  Service: General;  Laterality: N/A;  . LAPAROTOMY N/A 04/03/2017   Procedure: EXPLORATORY LAPAROTOMY;  Surgeon: Drew Bellow, MD;  Location: [facility] ORS;  Service: General;  Laterality: N/A;  . LYSIS OF ADHESION  04/03/2017   Procedure: LYSIS OF ADHESION;  Surgeon: Drew Bellow, MD;  Location: [facility] ORS;  Service: General;;    Home Medications:  Current Meds  Medication Sig  . aspirin (ASPIRIN EC) 81 MG EC tablet Take 81 mg by mouth daily. Swallow whole.  . losartan-hydrochlorothiazide (HYZAAR) 100-25 MG tablet Take 1 tablet by mouth daily.  . metoprolol succinate (TOPROL-XL) 50 MG 24 hr tablet Take 1 tablet (50 mg total) by mouth daily. Take with or immediately following a meal.  . Multiple Vitamins-Minerals (ONE-A-DAY MENS VITACRAVES PO) Take by mouth.  . Omega-3 Fatty Acids (FISH OIL PO) Take by mouth daily.    Allergies: No Known Allergies  Family History  Problem Relation Age of Onset  . Hypertension Mother   . Alzheimer's disease Mother   . Hypertension Father   . Cancer Father        lung  . Hypertension Sister   . Hypertension Brother   . Hypertension Sister   . Hypertension Sister   . Hypertension Brother     Social History:  reports that he quit smoking about 31 years ago. His smoking use included Cigarettes. He has never used smokeless tobacco. He reports that he does not drink alcohol  or use drugs.  ROS: A complete review of systems was performed.  All systems are negative except for pertinent findings as noted.  Physical Exam:  Vital signs in last 24 hours: Temp:  [98.2 F (36.8 C)-98.3 F (36.8 C)] 98.2 F (36.8 C) (06/13 0510) Pulse Rate:  [62-81] 62 (06/13 0510) Resp:  [20] 20 (06/13 0510) BP: (133-157)/(63-69) 157/69 (06/13 0510) SpO2:  [97 %-100 %] 97 % (06/13 0510) Weight:  [223 lb (101.2 kg)] 223 lb (101.2 kg) (06/13  0945) Constitutional:  Alert and oriented, No acute distress HEENT: [place] AT, moist mucus membranes.  Trachea midline, no masses Cardiovascular: Regular rate and rhythm, no clubbing, cyanosis, or edema. Respiratory: Normal respiratory effort, lungs clear bilaterally GI: Abdomen is Soft, mildly distended. Midline wound with staples in place. G-tube draining slightly green tinged fluid. GU: circumcised phallus with bilateral descended testicles. Foley draining clear yellow urine.  Skin: No rashes, bruises or suspicious lesions Neurologic: Grossly intact, no focal deficits, moving all 4 extremities Psychiatric: Normal mood and affect   Laboratory Data:   Recent Labs  04/06/17 0428 04/07/17 0408  WBC 20.4* 10.3  HGB 11.7* 11.6*  HCT 34.1* 33.7*    Recent Labs  04/06/17 0428 04/08/17 0429  NA 135 135  K 3.6 3.9  CL 103 104  CO2 29 25  GLUCOSE 153* 182*  BUN 22* 15  CREATININE 0.82 0.70  CALCIUM 8.0* 8.0*   No results for input(s): LABPT, INR in the last 72 hours. No results for input(s): LABURIN in the last 72 hours. Results for orders placed or performed during the hospital encounter of 03/30/17  Culture, Urine     Status: Abnormal   Collection Time: 04/05/17 10:56 AM  Result Value Ref Range Status   Specimen Description URINE, RANDOM  Final   Special Requests NONE  Final   Culture >=100,000 COLONIES/mL ENTEROCOCCUS FAECALIS (A)  Final   Report Status 04/07/2017 FINAL  Final   Organism ID, Bacteria ENTEROCOCCUS FAECALIS (A)  Final      Susceptibility   Enterococcus faecalis - MIC*    AMPICILLIN <=2 SENSITIVE Sensitive     LEVOFLOXACIN 1 SENSITIVE Sensitive     NITROFURANTOIN <=16 SENSITIVE Sensitive     VANCOMYCIN 1 SENSITIVE Sensitive     * >=100,000 COLONIES/mL ENTEROCOCCUS FAECALIS  Culture, expectorated sputum-assessment     Status: None   Collection Time: 04/06/17  6:33 PM  Result Value Ref Range Status   Specimen Description EXPECTORATED SPUTUM  Final    Special Requests NONE  Final   Sputum evaluation THIS SPECIMEN IS ACCEPTABLE FOR SPUTUM CULTURE  Final   Report Status 04/06/2017 FINAL  Final  Culture, respiratory (NON-Expectorated)     Status: None (Preliminary result)   Collection Time: 04/06/17  6:33 PM  Result Value Ref Range Status   Specimen Description EXPECTORATED SPUTUM  Final   Special Requests NONE Reflexed from [id number]  Final   Gram Stain   Final    RARE WBC PRESENT, PREDOMINANTLY PMN FEW GRAM POSITIVE COCCI IN CLUSTERS RARE GRAM NEGATIVE RODS RARE GRAM POSITIVE RODS    Culture   Final    CULTURE REINCUBATED FOR BETTER GROWTH Performed at [facility], [place] [address], [place], [place] [address]    Report Status PENDING  Incomplete     Radiologic Imaging: CT scan from 03/27/2017 was personally reviewed today, he has bilateral benign-appearing renal cysts as well as mild prostatic enlargement.  Assessment/plan:  1. Urinary retention/incomplete bladder emptying-  likely multifactorial including underlying BPH, immobility, narcotics, and UTI. Foley currently in place, would maintain this for 48 hours now that it's been placed with a voiding trial. If he is discharged prior to this, may be discharged home with a Foley place for outpatient follow-up.  2. BPH with lower urinary tract symptoms- mildly enlarged prostate on CT scan. Symptoms consistent with underlying BPH prior to admission. Would discharge on finasteride/Flomax. Recommend outpatient urologic follow-up regardless of voiding status upon discharge. He will also need PSA/DRE at an outpatient visit.  3.UTI- hospital-acquired urinary tract infection, being approximately treated with antibiotics.  Urology will sign off and arrange outpatient follow-up with [place] in about a month. If he is ultimately discharged with a Foley, please contact urology for outpatient voiding trial.  04/08/2017, 1:32 PM  Hollice Espy,  MD

## 2017-04-08 NOTE — Progress Notes (Signed)
[facility] [place] NAME: Jamere Stidham    MR#:  [id number]  DATE OF BIRTH:  04-10-1946  SUBJECTIVE:  CHIEF COMPLAINT:  No chief complaint on file.  The patient is 71 year old Caucasian male with past medical history significant for history of hypertension, hyperlipidemia, who was diagnosed with small bowel obstruction and underwent a exploratory laparotomy, lysis of adhesions, appendectomy, focal small bowel resection, gastrostomy placement by Dr. Bary Castilla 04/03/2017. Postoperatively, patient was noted to have cough, chest x-ray revealed a left lower lobe infiltrate, patient admits of some minimal phlegm production. He complains of pain with cough. Denies any shortness of breath. Admits to nausea. He was noted to have fever 04/04/2017 and leukocytosis, and was initiated on Zosyn for suspected aspiration pneumonitis. He feels satisfactory today, admits of some phlegm production, cough bothers him. Due to significant abdominal pain associated with cough. The patient is lightheaded and dizzy whenever he gets up, not as bad as it was in the past, however, still significant discomfort. Not feeling presyncopal Patient feels comfortable today, still some nausea, no cough or sputum production. Admits of urinary retention with discomfort in the bladder, dysuria. Bladder scan was more than 400, Foley catheter was advised, however, refused by primary surgeon. Ditropan is ordered as needed for bladder spasms.   Review of Systems  Constitutional: Negative for chills, fever and weight loss.  HENT: Negative for congestion.   Eyes: Negative for blurred vision and double vision.  Respiratory: Negative for shortness of breath and wheezing.   Cardiovascular: Negative for chest pain, palpitations, orthopnea, leg swelling and PND.  Gastrointestinal: Negative for blood in stool, constipation, diarrhea and vomiting.  Genitourinary: Positive for dysuria and  frequency. Negative for hematuria and urgency.  Musculoskeletal: Negative for falls.  Neurological: Positive for dizziness. Negative for tremors, focal weakness and headaches.  Endo/Heme/Allergies: Does not bruise/bleed easily.  Psychiatric/Behavioral: Negative for depression. The patient does not have insomnia.     VITAL SIGNS: Blood pressure (!) 157/69, pulse 62, temperature 98.2 F (36.8 C), temperature source Oral, resp. rate 20, height 6\' 1"  (1.854 m), weight 101.2 kg (223 lb), SpO2 97 %.  PHYSICAL EXAMINATION:   GENERAL:  71 y.o.-year-old patient lying in the bed with no acute distress.  EYES: Pupils equal, round, reactive to light and accommodation. No scleral icterus. Extraocular muscles intact.  HEENT: Head atraumatic, normocephalic. Oropharynx and nasopharynx clear.  NECK:  Supple, no jugular venous distention. No thyroid enlargement, no tenderness.  LUNGS: Normal breath sounds bilaterally, no wheezing, rales,rhonchi or crepitation. No use of accessory muscles of respiration.  CARDIOVASCULAR: S1, S2 normal. No murmurs, rubs, or gallops.  ABDOMEN: Soft, mild tender on palpation, midline scar healing, drainage nontender, distended. Bowel sounds present, diminished on the left. No organomegaly or mass. Discomfort on suprapubic palpation, no rebound or guarding EXTREMITIES: No pedal edema, cyanosis, or clubbing.  NEUROLOGIC: Cranial nerves II through XII are intact. Muscle strength 5/5 in all extremities. Sensation intact. Gait not checked.  PSYCHIATRIC: The patient is alert and oriented x 3.  SKIN: No obvious rash, lesion, or ulcer.   ORDERS/RESULTS REVIEWED:   CBC  Recent Labs Lab 04/02/17 0515 04/04/17 0525 04/05/17 0625 04/06/17 0428 04/07/17 0408  WBC 11.7* 19.5* 27.5* 20.4* 10.3  HGB 13.6 14.0 12.5* 11.7* 11.6*  HCT 39.8* 41.0 35.9* 34.1* 33.7*  PLT 212 240 184 179 206  MCV 90.3 91.8 91.0 90.9 90.4  MCH 30.9 31.3 31.7 31.2 31.0  MCHC 34.2 34.1  34.8 34.4 34.3  RDW  13.1 13.4 13.4 13.2 13.3  LYMPHSABS  --   --   --  1.3 1.1  MONOABS  --   --   --  1.2* 1.1*  EOSABS  --   --   --  0.1 0.3  BASOSABS  --   --   --  0.0 0.0   ------------------------------------------------------------------------------------------------------------------  Chemistries   Recent Labs Lab 04/02/17 0515 04/04/17 0525 04/05/17 0625 04/06/17 0428 04/08/17 0429  NA 142 135 135 135 135  K 4.6 4.6 3.8 3.6 3.9  CL 111 104 102 103 104  CO2 27 27 29 29 25   GLUCOSE 136* 145* 163* 153* 182*  BUN 14 16 25* 22* 15  CREATININE 0.86 0.92 1.00 0.82 0.70  CALCIUM 8.3* 7.8* 8.1* 8.0* 8.0*  MG  --  1.7 1.9 1.9 1.8  AST  --  23  --  15  --   ALT  --  46  --  22  --   ALKPHOS  --  54  --  61  --   BILITOT  --  1.5*  --  0.8  --    ------------------------------------------------------------------------------------------------------------------ estimated creatinine clearance is 107.4 mL/min (by C-G formula based on SCr of 0.7 mg/dL). ------------------------------------------------------------------------------------------------------------------ No results for input(s): TSH, T4TOTAL, T3FREE, THYROIDAB in the last 72 hours.  Invalid input(s): FREET3  Cardiac Enzymes No results for input(s): CKMB, TROPONINI, MYOGLOBIN in the last 168 hours.  Invalid input(s): CK ------------------------------------------------------------------------------------------------------------------ Invalid input(s): POCBNP ---------------------------------------------------------------------------------------------------------------  RADIOLOGY: No results found.  EKG:  Orders placed or performed during the hospital encounter of 03/26/17  . EKG 12-Lead  . EKG 12-Lead  . ED EKG  . ED EKG  . EKG    ASSESSMENT AND PLAN:  Active Problems:   Small bowel obstruction ([place])  #1. Left lower lobe atelectasis, no significant aspiration pneumonitis on repeated chest x-ray, continue incentive  spirometry, cough suppression with Tussionex.  #2. Leukocytosis, resolved  #3, orthostatic lightheadedness, dizziness, likely intravascular depletion, patient was given IV bolus, resolved #4. Small bowel obstruction, status post exploratory laparotomy, patient is nothing by mouth, passing flatus , had bowel movement, diet as per surgery, remains nothing by mouth, continue TPN, G tube is clamped #5. Urinary retention and spasms, continue patient on Flomax and finasteride, add Ditropan when necessary #6. Urinary tract infection with enterococcus , may change antibiotics to ampicillin or  Levofloxacin orally when intestinal function is better, now on Unasyn  Management plans discussed with the patient, family and they are in agreement.   DRUG ALLERGIES: No Known Allergies  CODE STATUS:     Code Status Orders        Start     Ordered   04/03/17 1733  Full code  Continuous     04/03/17 1732    Code Status History    Date Active Date Inactive Code Status Order ID Comments User Context   04/03/2017  5:32 PM 04/03/2017  5:32 PM Full Code [id number]  [name], MD Inpatient   04/01/2017  8:04 PM 04/03/2017  5:32 PM Full Code [id number]  [name], MD Inpatient   03/30/2017  4:32 PM 04/01/2017  8:04 PM Full Code [id number]  Byrnett, Forest Gleason, MD Inpatient      TOTAL TIME TAKING CARE OF THIS PATIENT: 35 minutes.    Theodoro Grist M.D on 04/08/2017 at 12:46 PM  Between 7am to 6pm - Pager - [phone]  After 6pm go to [email or web address] - password  [facility]  [facility]  Office  [phone]  CC: Primary care physician; Guadalupe Maple, MD

## 2017-04-08 NOTE — Progress Notes (Addendum)
Remains afebrile, VSS. No abdominal pain.  GT drainage down significantly. Tremendous urine output, frequent voids, elevated PVR.  Lungs: Clear. ABD: Distended, BS+. Soft. Wound: Clean. Mild erythema around the umbilicus, no drainage. GT site clean. Extrem: Soft.  Discussed informally w/ urology. With good urine volume, would not catheterize unless PVR increase. Pharmacy and dietician notes reviewed. Phos: corrected.  Plan:  Continue TPN. Clamp GT. Work on ambulation.

## 2017-04-08 NOTE — Telephone Encounter (Signed)
This patient was seen as an inpatient for urinary issues. I would like to see him in 4-6 weeks as an outpatient for IPSS/PVR/ PSA.    Hollice Espy, MD

## 2017-04-08 NOTE — Progress Notes (Signed)
[facility] NOTE   Pharmacy Consult for TPN monitoring  Patient Measurements: Height: 6\' 1"  (185.4 cm) Weight: 223 lb (101.2 kg) IBW/kg (Calculated) : 79.9 TPN AdjBW (KG): 85.1 Body mass index is 29.42 kg/m. Usual Weight:    Assessment:   GI: NPO Endo:  Insulin requirements in the past 24 hours: 11 units Lytes: K= 3.9,  Phos= 4.4,  Mag=1.8   Scr 0.70 Renal: Pulm: Cards:  Hepatobil: Neuro: ID:  Best Practices: TPN Access: TPN start date: 04/04/17   Current Nutrition:   Plan:  Continue Clinimix E 5/15 at 85 ml/hr with MVI, trace 259ml 20% lipid emulsion at 66ml/hr daily per Dr. Bary Castilla note. Lactated Ringers  at 10 ml/hr  SSI q6h ordered:  Insulin requirement during last 24 hours:  11 units Electrolytes WNL (Phos 4.4 now).  Patient received 3 doses of Potassium Phosphate 40 meq (27 mmol)  IV on 6/12 and 6/13. F/u next Electrolytes on 04/10/17. Pharmacist to order TPN and Lipid order daily per Dr. Bary Castilla.    Hever Castilleja A 04/08/2017,2:12 PM

## 2017-04-09 ENCOUNTER — Inpatient Hospital Stay: Payer: Medicare Other

## 2017-04-09 ENCOUNTER — Telehealth: Payer: Self-pay | Admitting: Urology

## 2017-04-09 LAB — CULTURE, RESPIRATORY W GRAM STAIN: Culture: NORMAL

## 2017-04-09 LAB — GLUCOSE, CAPILLARY
GLUCOSE-CAPILLARY: 169 mg/dL — AB (ref 65–99)
Glucose-Capillary: 145 mg/dL — ABNORMAL HIGH (ref 65–99)
Glucose-Capillary: 182 mg/dL — ABNORMAL HIGH (ref 65–99)
Glucose-Capillary: 138 mg/dL — ABNORMAL HIGH (ref 65–99)

## 2017-04-09 LAB — CULTURE, RESPIRATORY

## 2017-04-09 MED ORDER — FAT EMULSION 20 % IV EMUL
250.0000 mL | INTRAVENOUS | Status: AC
  Administered 2017-04-09: 250 mL via INTRAVENOUS
  Filled 2017-04-09: qty 250

## 2017-04-09 MED ORDER — TRACE MINERALS CR-CU-MN-SE-ZN 10-1000-500-60 MCG/ML IV SOLN
INTRAVENOUS | Status: AC
  Administered 2017-04-09: 17:00:00 via INTRAVENOUS
  Filled 2017-04-09 (×2): qty 2000

## 2017-04-09 MED ORDER — ACETAMINOPHEN 325 MG PO TABS: 650.0000 mg | ORAL_TABLET | ORAL | Status: DC | PRN

## 2017-04-09 NOTE — Progress Notes (Signed)
[facility] [place] NAME: Drew Mcmahon    MR#:  [id number]  DATE OF BIRTH:  05/07/1946  SUBJECTIVE:  CHIEF COMPLAINT:  No chief complaint on file.  The patient is 71 year old Caucasian male with past medical history significant for history of hypertension, hyperlipidemia, who was diagnosed with small bowel obstruction and underwent a exploratory laparotomy, lysis of adhesions, appendectomy, focal small bowel resection, gastrostomy placement by Dr. Bary Castilla 04/03/2017. Postoperatively, patient was noted to have cough, chest x-ray revealed a left lower lobe infiltrate, patient admits of some minimal phlegm production. He complains of pain with cough. Denies any shortness of breath. Admits to nausea. He was noted to have fever 04/04/2017 and leukocytosis, and was initiated on Zosyn for suspected aspiration pneumonitis. He feels satisfactory today, admits of some phlegm production, cough bothers him. Due to significant abdominal pain associated with cough. The patient is lightheaded and dizzy whenever he gets up, not as bad as it was in the past, however, still significant discomfort. Not feeling presyncopal Patient feels comfortable today, NGT is out now. Admits of urinary retention with discomfort in the bladder, dysuria. Bladder scan was more than 400, Foley catheter placed   Review of Systems  Constitutional: Negative for chills, fever and weight loss.  HENT: Negative for congestion.   Eyes: Negative for blurred vision and double vision.  Respiratory: Negative for shortness of breath and wheezing.   Cardiovascular: Negative for chest pain, palpitations, orthopnea, leg swelling and PND.  Gastrointestinal: Negative for blood in stool, constipation, diarrhea and vomiting.  Genitourinary: Positive for dysuria and frequency. Negative for hematuria and urgency.  Musculoskeletal: Negative for falls.  Neurological: Positive for dizziness. Negative  for tremors, focal weakness and headaches.  Endo/Heme/Allergies: Does not bruise/bleed easily.  Psychiatric/Behavioral: Negative for depression. The patient does not have insomnia.     VITAL SIGNS: Blood pressure (!) 142/63, pulse 63, temperature 98.3 F (36.8 C), temperature source Oral, resp. rate 18, height 6\' 1"  (1.854 m), weight 101.2 kg (223 lb), SpO2 97 %.  PHYSICAL EXAMINATION:   GENERAL:  71 y.o.-year-old patient lying in the bed with no acute distress.  EYES: Pupils equal, round, reactive to light and accommodation. No scleral icterus. Extraocular muscles intact.  HEENT: Head atraumatic, normocephalic. Oropharynx and nasopharynx clear.  NECK:  Supple, no jugular venous distention. No thyroid enlargement, no tenderness.  LUNGS: Normal breath sounds bilaterally, no wheezing, rales,rhonchi or crepitation. No use of accessory muscles of respiration.  CARDIOVASCULAR: S1, S2 normal. No murmurs, rubs, or gallops.  ABDOMEN: Soft, mild tender on palpation, midline scar healing, drainage nontender, distended. Bowel sounds present, diminished on the left. No organomegaly or mass. Foley in place. EXTREMITIES: No pedal edema, cyanosis, or clubbing.  NEUROLOGIC: Cranial nerves II through XII are intact. Muscle strength 5/5 in all extremities. Sensation intact. Gait not checked.  PSYCHIATRIC: The patient is alert and oriented x 3.  SKIN: No obvious rash, lesion, or ulcer.   ORDERS/RESULTS REVIEWED:   CBC  Recent Labs Lab 04/04/17 0525 04/05/17 0625 04/06/17 0428 04/07/17 0408  WBC 19.5* 27.5* 20.4* 10.3  HGB 14.0 12.5* 11.7* 11.6*  HCT 41.0 35.9* 34.1* 33.7*  PLT 240 184 179 206  MCV 91.8 91.0 90.9 90.4  MCH 31.3 31.7 31.2 31.0  MCHC 34.1 34.8 34.4 34.3  RDW 13.4 13.4 13.2 13.3  LYMPHSABS  --   --  1.3 1.1  MONOABS  --   --  1.2* 1.1*  EOSABS  --   --  0.1 0.3  BASOSABS  --   --  0.0 0.0    ------------------------------------------------------------------------------------------------------------------  Chemistries   Recent Labs Lab 04/04/17 0525 04/05/17 0625 04/06/17 0428 04/08/17 0429  NA 135 135 135 135  K 4.6 3.8 3.6 3.9  CL 104 102 103 104  CO2 27 29 29 25   GLUCOSE 145* 163* 153* 182*  BUN 16 25* 22* 15  CREATININE 0.92 1.00 0.82 0.70  CALCIUM 7.8* 8.1* 8.0* 8.0*  MG 1.7 1.9 1.9 1.8  AST 23  --  15  --   ALT 46  --  22  --   ALKPHOS 54  --  61  --   BILITOT 1.5*  --  0.8  --    ------------------------------------------------------------------------------------------------------------------ estimated creatinine clearance is 107.4 mL/min (by C-G formula based on SCr of 0.7 mg/dL). ------------------------------------------------------------------------------------------------------------------ No results for input(s): TSH, T4TOTAL, T3FREE, THYROIDAB in the last 72 hours.  Invalid input(s): FREET3  Cardiac Enzymes No results for input(s): CKMB, TROPONINI, MYOGLOBIN in the last 168 hours.  Invalid input(s): CK ------------------------------------------------------------------------------------------------------------------ Invalid input(s): POCBNP ---------------------------------------------------------------------------------------------------------------  RADIOLOGY: Dg Abd 2 Views  Result Date: 04/09/2017 CLINICAL DATA:  Small bowel obstruction. Exploratory laparotomy. Lysis of adhesions. Nasogastric tube removed. EXAM: ABDOMEN - 2 VIEW COMPARISON:  04/03/2017. FINDINGS: There are distended mid abdominal bowel loops, predominantly colonic. Proximal small bowel obstruction pattern noted previously appears to be resolving. There is some prominence of small bowel loops in the RIGHT lower quadrant, distal small bowel, not particularly dilated, and without air-fluid level. IMPRESSION: Resolving/improved small bowel obstruction following laparotomy. Colonic  distention, likely ileus. Electronically Signed   By: Staci Righter M.D.   On: 04/09/2017 09:29    EKG:  Orders placed or performed during the hospital encounter of 03/26/17  . EKG 12-Lead  . EKG 12-Lead  . ED EKG  . ED EKG  . EKG    ASSESSMENT AND PLAN:  Active Problems:   Small bowel obstruction (HCC)   Urinary retention   Urinary tract infection without hematuria  #1. Left lower lobe atelectasis, no significant aspiration pneumonitis on repeated chest x-ray, continue incentive spirometry, cough suppression with Tussionex.  #2. Leukocytosis, resolved  #3, orthostatic lightheadedness, dizziness, likely intravascular depletion, patient was given IV bolus, resolved #4. Small bowel obstruction, status post exploratory laparotomy, patient is nothing by mouth, passing flatus , had bowel movement, diet as per surgery, remains nothing by mouth, continue TPN, G tube is removed now. #5. Urinary retention and spasms, continue patient on Flomax and finasteride, add Ditropan when necessary   Foley in place, Urology consult appreciated. #6. Urinary tract infection with enterococcus , may change antibiotics to ampicillin or  Levofloxacin orally when intestinal function is better, now on Unasyn  Management plans discussed with the patient, family and they are in agreement.   DRUG ALLERGIES: No Known Allergies  CODE STATUS:     Code Status Orders        Start     Ordered   04/03/17 1733  Full code  Continuous     04/03/17 1732    Code Status History    Date Active Date Inactive Code Status Order ID Comments User Context   04/03/2017  5:32 PM 04/03/2017  5:32 PM Full Code [id number]  [name], MD Inpatient   04/01/2017  8:04 PM 04/03/2017  5:32 PM Full Code [id number]  [name], MD Inpatient   03/30/2017  4:32 PM 04/01/2017  8:04 PM Full Code [id number]  Byrnett, Forest Gleason,  MD Inpatient      TOTAL TIME TAKING CARE OF THIS PATIENT: 35 minutes.    Vaughan Basta M.D  on 04/09/2017 at 1:36 PM  Between 7am to 6pm - Pager - [phone]  After 6pm go to [email or web address] - password [email or web address] [facility]  Office  [phone]  CC: Primary care physician; Guadalupe Maple, MD

## 2017-04-09 NOTE — Progress Notes (Signed)
AVSS. Slept well. Passing some flatus. No nausea w/ GT clamped.  Some confusion yesterday re: foley. I had discussed w/ GU while Hospitalist had order foley placed.  Lungs: Clear. (Inspirext at 2500 cc) Cardio: RR. ABD: Soft, less distended, mild tympany. Wound: Fading redness near umbilicus. No drainage. Ambulating well. (10 laps this AM alone).   Unasyn started yesterday to cover enterobacter.  Tolerating TPN.  Will check KUB to see if proximal SB distension is resolving.  Path: No etiology for original obstruction. Normal appendix w/ obliterated lumen.

## 2017-04-09 NOTE — Progress Notes (Signed)
Nutrition Follow-up  DOCUMENTATION CODES:   Severe malnutrition in context of acute illness/injury, Obesity unspecified  INTERVENTION:  Continue TPN per Dr. Bary Castilla. Patient currently receiving Clinimix E 5/15 @ 85 ml/hr + 20% ILE @ 20 ml/hr over 12 hours. Provides 1928 (88% minimum estimated kcal needs), 102 grams of protein (102% minimum estimated protein needs), 2040 ml fluid daily.  Recommend ordering daily weights.  NUTRITION DIAGNOSIS:   Malnutrition (Severe) related to acute illness (partial small bowel obstruction) as evidenced by energy intake < or equal to 50% for > or equal to 5 days, percent weight loss, moderate depletion of body fat.  Ongoing - addressing with TPN.  GOAL:   Patient will meet greater than or equal to 90% of their needs  Meeting 88% calorie needs, meeting 100% protein needs.  MONITOR:   Diet advancement, Labs, Weight trends, I & O's  REASON FOR ASSESSMENT:   Malnutrition Screening Tool    ASSESSMENT:   71 year old male with PMHx of HLD, HTN presented with vomiting, bloating, and abdominal pain found to have partial SBO.  Patient tolerating ambulation well. He walked 20 laps around nursing station today. G-tube now clamped and patient does not have any nausea. Still feels weak. Per Abdominal x-ray today proximal SBO pattern appears to be resolving but there is colonic distention likely an ileus.  Access: PICC double lumen placed 04/03/2017  TPN: tolerating goal rate of Clinimix E 5/15 @ 85 ml/hr + 20% ILE @ 20 ml/hr over 12 hrs  Medications reviewed and include: Novolog 0-15 units Q6hrs (received 12 units past 24 hrs), pantoprazole, Unasyn.   Labs reviewed: CBG 145-182 past 24 hrs. Phosphorus was 4.4 on 6/13 following repletion.   I/O 0700 6/13 to 0700 6/14: 4.05 L UOP (1.7 ml/kg/hr), 0 ml output from G-tube, one unmeasured bowel movement  Weight trend: 101.2 kg on 6/13 (weight stable since initiating TPN)  Diet Order:  Diet NPO time  specified Except for: Sips with Meds .TPN (CLINIMIX-E) Adult .TPN (CLINIMIX-E) Adult  Skin:  Wound (see comment) (closed incisions on abdomen)  Last BM:  04/07/2017 - small type 7  Height:   Ht Readings from Last 1 Encounters:  04/03/17 6\' 1"  (1.854 m)    Weight:   Wt Readings from Last 1 Encounters:  04/08/17 223 lb (101.2 kg)    Ideal Body Weight:  83.6 kg  BMI:  Body mass index is 29.42 kg/m.  Estimated Nutritional Needs:   Kcal:  1275-1700 (MSJ x 1.2-1.3)  Protein:  100-120 grams (1-1.2 grams/kg)  Fluid:  2.2-2.4 L/day  EDUCATION NEEDS:   No education needs identified at this time  Willey Blade, MS, RD, LDN Pager: [phone] After Hours Pager: [phone]

## 2017-04-09 NOTE — Plan of Care (Signed)
Problem: Pain Managment: Goal: General experience of comfort will improve Outcome: Progressing Pt has not expressed to RN that he feels any pain or nausea today. States "he just feels weak"  Problem: Physical Regulation: Goal: Ability to maintain clinical measurements within normal limits will improve Outcome: Progressing Pt has walked 20 laps around the nursing station today, pt tolerated ambulation well. RN will continue to encourage ambulation.

## 2017-04-09 NOTE — Progress Notes (Signed)
While rounding unit, [place] visited Pt to provide spiritual care. Pt was asleep at the time of this visit, but [place] talked with the pt's family members in [place]. [place] told Pt's family that he would visit Pt when he is awake.

## 2017-04-09 NOTE — Telephone Encounter (Signed)
App made and mailed to patient ° °Drew Mcmahon °

## 2017-04-10 LAB — CBC WITH DIFFERENTIAL/PLATELET
Basophils Absolute: 0.1 10*3/uL (ref 0–0.1)
Basophils Relative: 1 %
EOS ABS: 0.5 10*3/uL (ref 0–0.7)
EOS PCT: 4 %
HCT: 32.6 % — ABNORMAL LOW (ref 40.0–52.0)
Hemoglobin: 11.5 g/dL — ABNORMAL LOW (ref 13.0–18.0)
LYMPHS PCT: 17 %
Lymphs Abs: 1.8 10*3/uL (ref 1.0–3.6)
MCH: 31.5 pg (ref 26.0–34.0)
MCHC: 35.3 g/dL (ref 32.0–36.0)
MCV: 89.3 fL (ref 80.0–100.0)
MONO ABS: 1.1 10*3/uL — AB (ref 0.2–1.0)
Monocytes Relative: 10 %
Neutro Abs: 7.2 10*3/uL — ABNORMAL HIGH (ref 1.4–6.5)
Neutrophils Relative %: 68 %
PLATELETS: 323 10*3/uL (ref 150–440)
RBC: 3.65 MIL/uL — AB (ref 4.40–5.90)
RDW: 13.2 % (ref 11.5–14.5)
WBC: 10.7 10*3/uL — AB (ref 3.8–10.6)

## 2017-04-10 LAB — GLUCOSE, CAPILLARY
GLUCOSE-CAPILLARY: 142 mg/dL — AB (ref 65–99)
GLUCOSE-CAPILLARY: 176 mg/dL — AB (ref 65–99)
GLUCOSE-CAPILLARY: 181 mg/dL — AB (ref 65–99)
Glucose-Capillary: 166 mg/dL — ABNORMAL HIGH (ref 65–99)

## 2017-04-10 LAB — BASIC METABOLIC PANEL
ANION GAP: 4 — AB (ref 5–15)
BUN: 17 mg/dL (ref 6–20)
CALCIUM: 8 mg/dL — AB (ref 8.9–10.3)
CO2: 25 mmol/L (ref 22–32)
CREATININE: 0.83 mg/dL (ref 0.61–1.24)
Chloride: 103 mmol/L (ref 101–111)
Glucose, Bld: 174 mg/dL — ABNORMAL HIGH (ref 65–99)
Potassium: 4.1 mmol/L (ref 3.5–5.1)
SODIUM: 132 mmol/L — AB (ref 135–145)

## 2017-04-10 LAB — MAGNESIUM: MAGNESIUM: 2 mg/dL (ref 1.7–2.4)

## 2017-04-10 LAB — PHOSPHORUS: PHOSPHORUS: 3.5 mg/dL (ref 2.5–4.6)

## 2017-04-10 MED ORDER — FAT EMULSION 20 % IV EMUL
250.0000 mL | INTRAVENOUS | Status: AC
  Administered 2017-04-10: 250 mL via INTRAVENOUS
  Filled 2017-04-10 (×2): qty 250

## 2017-04-10 MED ORDER — PANTOPRAZOLE SODIUM 40 MG PO TBEC
40.0000 mg | DELAYED_RELEASE_TABLET | Freq: Every day | ORAL | Status: DC
  Administered 2017-04-11 – 2017-04-14 (×4): 40 mg via ORAL
  Filled 2017-04-10 (×4): qty 1

## 2017-04-10 MED ORDER — NITROFURANTOIN MONOHYD MACRO 100 MG PO CAPS
100.0000 mg | ORAL_CAPSULE | Freq: Two times a day (BID) | ORAL | Status: DC
  Administered 2017-04-10 – 2017-04-14 (×9): 100 mg via ORAL
  Filled 2017-04-10 (×10): qty 1

## 2017-04-10 MED ORDER — TRACE MINERALS CR-CU-MN-SE-ZN 10-1000-500-60 MCG/ML IV SOLN
INTRAVENOUS | Status: AC
  Administered 2017-04-10: 19:00:00 via INTRAVENOUS
  Filled 2017-04-10: qty 2000

## 2017-04-10 NOTE — Progress Notes (Signed)
[facility] NOTE   Pharmacy Consult for TPN monitoring  Patient Measurements: Height: 6\' 1"  (185.4 cm) Weight: 223 lb (101.2 kg) IBW/kg (Calculated) : 79.9 TPN AdjBW (KG): 85.1 Body mass index is 29.42 kg/m. Usual Weight:    Assessment:   GI: Full liquids Endo:  Insulin requirements in the past 24 hours: 11 units Lytes: K= 4.1,  Phos= 3.5,  Mag=2.0    Renal:Scr 0.83 Pulm: Cards:  Hepatobil: Neuro: ID:  Best Practices: TPN Access: TPN start date: 04/04/17   Current Nutrition:   Plan:  Continue Clinimix E 5/15 at 85 ml/hr with MVI, trace 228ml 20% lipid emulsion at 3ml/hr daily per Dr. Bary Castilla. Lactated Ringers  at 10 ml/hr  SSI q6h ordered:  Insulin requirement during last 24 hours:  11 units Electrolytes WNL. (Patient received 3 doses of Potassium Phosphate 40 meq (27 mmol)  IV on 6/12 and 6/13.) F/u for next Electrolytes Pharmacist to order TPN and Lipid order daily per Dr. Bary Castilla.    Drew Mcmahon A 04/10/2017,11:31 AM

## 2017-04-10 NOTE — Progress Notes (Signed)
[facility] [place] NAME: Drew Mcmahon    MR#:  [id number]  DATE OF BIRTH:  11/04/1945  CHIEF COMPLAINT:  No chief complaint on file.  The patient is 71 year old Caucasian male with past medical history significant for history of hypertension, hyperlipidemia, who was diagnosed with small bowel obstruction and underwent a exploratory laparotomy, lysis of adhesions, appendectomy, focal small bowel resection, gastrostomy placement by Dr. Bary Castilla 04/03/2017. Postoperatively, patient was noted to have cough, chest x-ray revealed a left lower lobe infiltrate, patient admits of some minimal phlegm production. He complains of pain with cough. Denies any shortness of breath. Admits to nausea. He was noted to have fever 04/04/2017 and leukocytosis, and was initiated on Zosyn for suspected aspiration pneumonitis. He feels satisfactory today, Patient feels comfortable today, NGT is out now. Admits of urinary retention with discomfort in the bladder, dysuria. Bladder scan was more than 400, Foley catheter placed Now on liquid diet, contd TPN also.   Review of Systems  Constitutional: Negative for chills, fever and weight loss.  HENT: Negative for congestion.   Eyes: Negative for blurred vision and double vision.  Respiratory: Negative for shortness of breath and wheezing.   Cardiovascular: Negative for chest pain, palpitations, orthopnea, leg swelling and PND.  Gastrointestinal: Negative for blood in stool, constipation, diarrhea and vomiting.  Genitourinary: Positive for dysuria and frequency. Negative for hematuria and urgency.  Musculoskeletal: Negative for falls.  Neurological: Negative for dizziness, tremors, focal weakness and headaches.  Endo/Heme/Allergies: Does not bruise/bleed easily.  Psychiatric/Behavioral: Negative for depression. The patient does not have insomnia.     VITAL SIGNS: Blood pressure 133/69, pulse 66, temperature 98.1 F  (36.7 C), temperature source Oral, resp. rate (!) 24, height 6\' 1"  (1.854 m), weight 101.2 kg (223 lb), SpO2 100 %.  PHYSICAL EXAMINATION:   GENERAL:  71 y.o.-year-old patient lying in the bed with no acute distress.  EYES: Pupils equal, round, reactive to light and accommodation. No scleral icterus. Extraocular muscles intact.  HEENT: Head atraumatic, normocephalic. Oropharynx and nasopharynx clear.  NECK:  Supple, no jugular venous distention. No thyroid enlargement, no tenderness.  LUNGS: Normal breath sounds bilaterally, no wheezing, rales,rhonchi or crepitation. No use of accessory muscles of respiration.  CARDIOVASCULAR: S1, S2 normal. No murmurs, rubs, or gallops.  ABDOMEN: Soft, mild tender on palpation, midline scar healing, drainage nontender, distended. Bowel sounds present, diminished on the left. No organomegaly or mass. Foley in place. EXTREMITIES: No pedal edema, cyanosis, or clubbing.  NEUROLOGIC: Cranial nerves II through XII are intact. Muscle strength 5/5 in all extremities. Sensation intact. Gait not checked.  PSYCHIATRIC: The patient is alert and oriented x 3.  SKIN: No obvious rash, lesion, or ulcer.   ORDERS/RESULTS REVIEWED:   CBC  Recent Labs Lab 04/04/17 0525 04/05/17 0625 04/06/17 0428 04/07/17 0408 04/10/17 0500  WBC 19.5* 27.5* 20.4* 10.3 10.7*  HGB 14.0 12.5* 11.7* 11.6* 11.5*  HCT 41.0 35.9* 34.1* 33.7* 32.6*  PLT 240 184 179 206 323  MCV 91.8 91.0 90.9 90.4 89.3  MCH 31.3 31.7 31.2 31.0 31.5  MCHC 34.1 34.8 34.4 34.3 35.3  RDW 13.4 13.4 13.2 13.3 13.2  LYMPHSABS  --   --  1.3 1.1 1.8  MONOABS  --   --  1.2* 1.1* 1.1*  EOSABS  --   --  0.1 0.3 0.5  BASOSABS  --   --  0.0 0.0 0.1   ------------------------------------------------------------------------------------------------------------------  Chemistries   Recent  Labs Lab 04/04/17 0525 04/05/17 0625 04/06/17 0428 04/08/17 0429 04/10/17 0500  NA 135 135 135 135 132*  K 4.6 3.8 3.6  3.9 4.1  CL 104 102 103 104 103  CO2 27 29 29 25 25   GLUCOSE 145* 163* 153* 182* 174*  BUN 16 25* 22* 15 17  CREATININE 0.92 1.00 0.82 0.70 0.83  CALCIUM 7.8* 8.1* 8.0* 8.0* 8.0*  MG 1.7 1.9 1.9 1.8 2.0  AST 23  --  15  --   --   ALT 46  --  22  --   --   ALKPHOS 54  --  61  --   --   BILITOT 1.5*  --  0.8  --   --    ------------------------------------------------------------------------------------------------------------------ estimated creatinine clearance is 103.5 mL/min (by C-G formula based on SCr of 0.83 mg/dL). ------------------------------------------------------------------------------------------------------------------ No results for input(s): TSH, T4TOTAL, T3FREE, THYROIDAB in the last 72 hours.  Invalid input(s): FREET3  Cardiac Enzymes No results for input(s): CKMB, TROPONINI, MYOGLOBIN in the last 168 hours.  Invalid input(s): CK ------------------------------------------------------------------------------------------------------------------ Invalid input(s): POCBNP ---------------------------------------------------------------------------------------------------------------  RADIOLOGY: Dg Abd 2 Views  Result Date: 04/09/2017 CLINICAL DATA:  Small bowel obstruction. Exploratory laparotomy. Lysis of adhesions. Nasogastric tube removed. EXAM: ABDOMEN - 2 VIEW COMPARISON:  04/03/2017. FINDINGS: There are distended mid abdominal bowel loops, predominantly colonic. Proximal small bowel obstruction pattern noted previously appears to be resolving. There is some prominence of small bowel loops in the RIGHT lower quadrant, distal small bowel, not particularly dilated, and without air-fluid level. IMPRESSION: Resolving/improved small bowel obstruction following laparotomy. Colonic distention, likely ileus. Electronically Signed   By: Staci Righter M.D.   On: 04/09/2017 09:29    EKG:  Orders placed or performed during the hospital encounter of 03/26/17  . EKG 12-Lead  .  EKG 12-Lead  . ED EKG  . ED EKG  . EKG    ASSESSMENT AND PLAN:  Active Problems:   Small bowel obstruction (HCC)   Urinary retention   Urinary tract infection without hematuria  #1. Left lower lobe atelectasis, no significant aspiration pneumonitis on repeated chest x-ray, continue incentive spirometry, cough suppression with Tussionex. Better now.  #2. Leukocytosis, resolved  #3, orthostatic lightheadedness, dizziness, likely intravascular depletion, patient was given IV bolus, resolved #4. Small bowel obstruction, status post exploratory laparotomy, patient was nothing by mouth, passing flatus , had bowel movement, liquid diet as per surgery, continue TPN, NG tube is removed now. #5. Urinary retention and spasms, continue patient on Flomax and finasteride, add Ditropan when necessary   Foley in place, Urology consult appreciated.    Suggested to have a trial of foley removal in 2-3 days or to follow in office with catheter. #6. Urinary tract infection with enterococcus ,   on macrobid now.  Management plans discussed with the patient, family and they are in agreement.   DRUG ALLERGIES: No Known Allergies  CODE STATUS:     Code Status Orders        Start     Ordered   04/03/17 1733  Full code  Continuous     04/03/17 1732    Code Status History    Date Active Date Inactive Code Status Order ID Comments User Context   04/03/2017  5:32 PM 04/03/2017  5:32 PM Full Code [id number]  [name], MD Inpatient   04/01/2017  8:04 PM 04/03/2017  5:32 PM Full Code [id number]  [name], MD Inpatient   03/30/2017  4:32 PM  04/01/2017  8:04 PM Full Code [id number]  [name], MD Inpatient      TOTAL TIME TAKING CARE OF THIS PATIENT: 35 minutes.    Vaughan Basta M.D on 04/10/2017 at 9:15 PM  Between 7am to 6pm - Pager - [phone]  After 6pm go to [email or web address] - password [email or web address] [facility]  Office  [phone]  CC: Primary care  physician; Guadalupe Maple, MD

## 2017-04-10 NOTE — Progress Notes (Signed)
AVSS. Tolerated liquids well. Reports passing large amount of flatus and some small amounts of stool. Transient bloating after po restarted. Lungs: Clear Cardio: RR. ABD: Soft, good BS, less tympanitic. Extrem: Soft. Wound: Resolving redness around the umbilicus. Labs: Reviewed. Plan: D/C unasyn and substitue po macrobid for enteroccocus faecalis. PO PPI. Advance diet. Encouraged yogurt if tolerable.

## 2017-04-10 NOTE — Care Management Important Message (Signed)
Important Message  Patient Details  Name: Drew Mcmahon MRN: [id number] Date of Birth: 19-Aug-1946   Medicare Important Message Given:  Yes    Beverly Sessions, RN 04/10/2017, 2:57 PM

## 2017-04-10 NOTE — Care Management (Signed)
Barrier: Continue TPN, advance diet

## 2017-04-11 LAB — GLUCOSE, CAPILLARY
GLUCOSE-CAPILLARY: 125 mg/dL — AB (ref 65–99)
Glucose-Capillary: 174 mg/dL — ABNORMAL HIGH (ref 65–99)
Glucose-Capillary: 176 mg/dL — ABNORMAL HIGH (ref 65–99)
Glucose-Capillary: 182 mg/dL — ABNORMAL HIGH (ref 65–99)

## 2017-04-11 MED ORDER — FAT EMULSION 20 % IV EMUL
250.0000 mL | INTRAVENOUS | Status: AC
  Administered 2017-04-11: 250 mL via INTRAVENOUS
  Filled 2017-04-11: qty 250

## 2017-04-11 MED ORDER — CEFAZOLIN SODIUM-DEXTROSE 1-4 GM/50ML-% IV SOLN
1.0000 g | Freq: Three times a day (TID) | INTRAVENOUS | Status: DC
  Administered 2017-04-11 – 2017-04-14 (×9): 1 g via INTRAVENOUS
  Filled 2017-04-11 (×12): qty 50

## 2017-04-11 MED ORDER — TRACE MINERALS CR-CU-MN-SE-ZN 10-1000-500-60 MCG/ML IV SOLN
INTRAVENOUS | Status: AC
  Administered 2017-04-11: 19:00:00 via INTRAVENOUS
  Filled 2017-04-11 (×2): qty 2000

## 2017-04-11 NOTE — Progress Notes (Signed)
[facility] [place] NAME: Drew Mcmahon    MR#:  [id number]  DATE OF BIRTH:  [birth year]  SUBJECTIVE:  CHIEF COMPLAINT:  No chief complaint on file.  - SBO and s/p partial small bowel resection, exploratory laparotomy - no BM today, passing flatus. On liquid diet and also TPN  REVIEW OF SYSTEMS:  Review of Systems  Constitutional: Negative for chills, fever and malaise/fatigue.  HENT: Negative for ear discharge, hearing loss, nosebleeds and sinus pain.   Eyes: Negative for blurred vision and double vision.  Respiratory: Negative for cough, shortness of breath and wheezing.   Cardiovascular: Negative for chest pain, palpitations and leg swelling.  Gastrointestinal: Positive for abdominal pain and constipation. Negative for diarrhea, nausea and vomiting.  Genitourinary: Negative for dysuria.  Neurological: Negative for dizziness, seizures and headaches.    DRUG ALLERGIES:  No Known Allergies  VITALS:  Blood pressure 121/63, pulse (!) 58, temperature 97.8 F (36.6 C), temperature source Oral, resp. rate 20, height 6\' 1"  (1.854 m), weight 101.2 kg (223 lb), SpO2 98 %.  PHYSICAL EXAMINATION:  Physical Exam  GENERAL:  71 y.o.-year-old patient sitting in the bed with no acute distress.  EYES: Pupils equal, round, reactive to light and accommodation. No scleral icterus. Extraocular muscles intact.  HEENT: Head atraumatic, normocephalic. Oropharynx and nasopharynx clear.  NECK:  Supple, no jugular venous distention. No thyroid enlargement, no tenderness.  LUNGS: Normal breath sounds bilaterally, no wheezing, rales,rhonchi or crepitation. No use of accessory muscles of respiration.  CARDIOVASCULAR: S1, S2 normal. No murmurs, rubs, or gallops.  ABDOMEN: Soft, but distended, midline staples in place. Erythema around mid staples extending to the sides. Hypoactive Bowel sounds present. No organomegaly or mass.  EXTREMITIES: No pedal edema,  cyanosis, or clubbing.  NEUROLOGIC: Cranial nerves II through XII are intact. Muscle strength 5/5 in all extremities. Sensation intact. Gait not checked.  PSYCHIATRIC: The patient is alert and oriented x 3.  SKIN: No obvious rash, lesion, or ulcer.    LABORATORY PANEL:   CBC  Recent Labs Lab 04/10/17 0500  WBC 10.7*  HGB 11.5*  HCT 32.6*  PLT 323   ------------------------------------------------------------------------------------------------------------------  Chemistries   Recent Labs Lab 04/06/17 0428  04/10/17 0500  NA 135  < > 132*  K 3.6  < > 4.1  CL 103  < > 103  CO2 29  < > 25  GLUCOSE 153*  < > 174*  BUN 22*  < > 17  CREATININE 0.82  < > 0.83  CALCIUM 8.0*  < > 8.0*  MG 1.9  < > 2.0  AST 15  --   --   ALT 22  --   --   ALKPHOS 61  --   --   BILITOT 0.8  --   --   < > = values in this interval not displayed. ------------------------------------------------------------------------------------------------------------------  Cardiac Enzymes No results for input(s): TROPONINI in the last 168 hours. ------------------------------------------------------------------------------------------------------------------  RADIOLOGY:  No results found.  EKG:   Orders placed or performed during the hospital encounter of 03/26/17  . EKG 12-Lead  . EKG 12-Lead  . ED EKG  . ED EKG  . EKG    ASSESSMENT AND PLAN:   71 y/o M with PMH of hypertension, hyperlipidemia admitted with abdominal pain, noted to have SBO  #1 small bowel obstruction- mgmt per surgery - s/p exlap, adhesionolysis and has a G tube  - NG tube out, able to tolerate  liquids, still awaiting BM - on TPN for now- discontinue once able to eat solid food  #2 Abdominal wall cellulitis- around the surgical staples in the middle Start ancef today, if worsening- will do vancomycin Monitor, margins marked, no area of any fluctuance  #3 Acute urinary retention- appreciate urology input- foley placed  for now On proscar and flomax. Oxybutynin prn for bladder spasms  #4 Left lower lobe atelectasis- aspiration pneumonitis, encourage ambulation, incentive spirometry.  #5 Enterococcus fecalis UTI- on macrobid  #6 DVT Prophylaxis- lovenox    All the records are reviewed and case discussed with Care Management/Social Workerr. Management plans discussed with the patient, family and they are in agreement.  CODE STATUS: Full Code  TOTAL TIME TAKING CARE OF THIS PATIENT: 35 minutes.   POSSIBLE D/C IN 2 DAYS, DEPENDING ON CLINICAL CONDITION.   Gladstone Lighter M.D on 04/11/2017 at 3:59 PM  Between 7am to 6pm - Pager - [phone]  After 6pm go to [email or web address] - password [email or web address] [facility]  Office  [phone]  CC: Primary care physician; Guadalupe Maple, MD

## 2017-04-11 NOTE — Progress Notes (Signed)
Pharmacy Antibiotic Note  Drew Mcmahon is a 71 y.o. male admitted on 03/30/2017, now being treated for cellulitis.  Pharmacy has been consulted for cefazolin dosing.  Plan: Will start patient on cefazolin 1g IV every 8 hours.   Height: 6\' 1"  (185.4 cm) Weight: 223 lb (101.2 kg) IBW/kg (Calculated) : 79.9  Temp (24hrs), Avg:98 F (36.7 C), Min:97.8 F (36.6 C), Max:98.1 F (36.7 C)   Recent Labs Lab 04/05/17 0625 04/06/17 0428 04/07/17 0408 04/08/17 0429 04/10/17 0500  WBC 27.5* 20.4* 10.3  --  10.7*  CREATININE 1.00 0.82  --  0.70 0.83    Estimated Creatinine Clearance: 103.5 mL/min (by C-G formula based on SCr of 0.83 mg/dL).    No Known Allergies  Antimicrobials this admission: 6/10 Zosyn >> 6/12 6/12 Unasyn >> 6/15 6/16 Cefazolin >>  Dose adjustments this admission:   Microbiology results:  Thank you for allowing pharmacy to be a part of this patient's care.  Pernell Dupre, PharmD, BCPS Clinical Pharmacist 04/11/2017 4:37 PM

## 2017-04-11 NOTE — Progress Notes (Signed)
He had recent small bowel resection. He reports he took approximately 50% of his full liquid breakfast this morning including grits. He has passed gas but no bowel movement since surgery. He reports he walked around the nursing station 12 times this morning. He reports some minimal redness around his wound. A green line was drawn around the redness yesterday.  On exam vital signs are stable and afebrile. He is awake alert and oriented and walks easily. Does have an indwelling Foley urinary catheter. His abdomen is soft. There is some redness around the mid aspect of his staple line particular below the umbilicus some of the redness on the right side has receded from the green line so the redness on the left side is just about a centimeter past the ring line. There is minimal tenderness in the site and no fluctuance.  Impression gradual improvement after recent enterectomy, urinary retention, urinary infection  Plan is to continue the TPN at present as he has not yet had a bowel movement. Continue walking in the hallway. Continue current diet. Continue nitrofurantoin

## 2017-04-11 NOTE — Progress Notes (Signed)
[facility] NOTE   Pharmacy Consult for TPN monitoring  Patient Measurements: Height: 6\' 1"  (185.4 cm) Weight: 223 lb (101.2 kg) IBW/kg (Calculated) : 79.9 TPN AdjBW (KG): 85.1 Body mass index is 29.42 kg/m. Usual Weight:    Assessment:   GI: Full liquids Endo:  Insulin requirements in the past 24 hours: 11 units Lytes: K= 4.1,  Phos= 3.5,  Mag=2.0    Renal:Scr 0.83 Pulm: Cards:  Hepatobil: Neuro: ID:  Best Practices: TPN Access: TPN start date: 04/04/17   Current Nutrition:   Plan:  Continue Clinimix E 5/15 at 85 ml/hr with MVI, trace 235ml 20% lipid emulsion at 77ml/hr daily per Dr. Bary Castilla. Lactated Ringers  at 10 ml/hr  SSI q6h ordered:  Insulin requirement during last 24 hours:  11 units Electrolytes WNL. F/u for next Electrolytes Pharmacist to order TPN and Lipid order daily per Dr. Bary Castilla.    Olivia Canter, [facility] Clinical Pharmacist 04/11/2017,11:35 AM

## 2017-04-12 LAB — GLUCOSE, CAPILLARY
GLUCOSE-CAPILLARY: 146 mg/dL — AB (ref 65–99)
GLUCOSE-CAPILLARY: 163 mg/dL — AB (ref 65–99)
GLUCOSE-CAPILLARY: 144 mg/dL — AB (ref 65–99)
Glucose-Capillary: 173 mg/dL — ABNORMAL HIGH (ref 65–99)
Glucose-Capillary: 178 mg/dL — ABNORMAL HIGH (ref 65–99)

## 2017-04-12 LAB — BASIC METABOLIC PANEL
Anion gap: 5 (ref 5–15)
BUN: 17 mg/dL (ref 6–20)
CHLORIDE: 102 mmol/L (ref 101–111)
CO2: 26 mmol/L (ref 22–32)
Calcium: 8.4 mg/dL — ABNORMAL LOW (ref 8.9–10.3)
Creatinine, Ser: 0.74 mg/dL (ref 0.61–1.24)
GFR calc Af Amer: >60 mL/min (ref >60)
GFR calc non Af Amer: >60 mL/min (ref >60)
GLUCOSE: 163 mg/dL — AB (ref 65–99)
POTASSIUM: 3.9 mmol/L (ref 3.5–5.1)
Sodium: 133 mmol/L — ABNORMAL LOW (ref 135–145)

## 2017-04-12 LAB — MAGNESIUM: MAGNESIUM: 1.9 mg/dL (ref 1.7–2.4)

## 2017-04-12 LAB — PHOSPHORUS: Phosphorus: 3.8 mg/dL (ref 2.5–4.6)

## 2017-04-12 MED ORDER — FAT EMULSION 20 % IV EMUL
250.0000 mL | INTRAVENOUS | Status: DC
  Administered 2017-04-12: 250 mL via INTRAVENOUS
  Filled 2017-04-12: qty 250

## 2017-04-12 MED ORDER — TRACE MINERALS CR-CU-MN-SE-ZN 10-1000-500-60 MCG/ML IV SOLN
INTRAVENOUS | Status: AC
  Administered 2017-04-12: 18:00:00 via INTRAVENOUS
  Filled 2017-04-12: qty 2000

## 2017-04-12 NOTE — Progress Notes (Signed)
He reports good progress today. He is tolerating his full liquid diet satisfactorily. He has had a bowel movement. He is walking a lot in the hallway. He reports no incisional pain. No fever. Due to redness around the wound he was started on Kefzol yesterday.  On examination vital signs are stable. He is awake alert and oriented. Abdomen is soft and nontender. There is a larger black line drawn around the wound however the redness appears to have receded away from the green line. There is some firmness but no tenderness and no fluctuance. There is scant serous drainage from the gastrostomy site although no erythema. A gauze dressing was applied with paper tape around the gastrostomy. He has indwelling Foley catheter  Lab work noted, glucose 163  Impression improvement with regard to small bowel obstruction. Redness around the wound may be some cellulitis and has receded  Plan is to decrease rate of TPN, advance diet, continue ambulation

## 2017-04-12 NOTE — Progress Notes (Signed)
Pt was able to void 175 with 0 PVR.

## 2017-04-12 NOTE — Progress Notes (Signed)
[name] NAME: Drew Mcmahon    MR#:  [id number]  DATE OF BIRTH:  15-May-1946  SUBJECTIVE:  CHIEF COMPLAINT:  No chief complaint on file.  - SBO and s/p partial small bowel resection, Tolerating full liquids this morning. Had a very small bowel movement and feels much better  REVIEW OF SYSTEMS:  Review of Systems  Constitutional: Negative for chills, fever and malaise/fatigue.  HENT: Negative for ear discharge, hearing loss, nosebleeds and sinus pain.   Eyes: Negative for blurred vision and double vision.  Respiratory: Negative for cough, shortness of breath and wheezing.   Cardiovascular: Negative for chest pain, palpitations and leg swelling.  Gastrointestinal: Positive for abdominal pain. Negative for constipation, diarrhea, nausea and vomiting.  Genitourinary: Negative for dysuria.  Neurological: Negative for dizziness, seizures and headaches.    DRUG ALLERGIES:  No Known Allergies  VITALS:  Blood pressure 138/68, pulse 65, temperature 98.3 F (36.8 C), temperature source Oral, resp. rate 20, height 6\' 1"  (1.854 m), weight 101.2 kg (223 lb), SpO2 96 %.  PHYSICAL EXAMINATION:  Physical Exam  GENERAL:  71 y.o.-year-old patient sitting in the bed with no acute distress.  EYES: Pupils equal, round, reactive to light and accommodation. No scleral icterus. Extraocular muscles intact.  HEENT: Head atraumatic, normocephalic. Oropharynx and nasopharynx clear.  NECK:  Supple, no jugular venous distention. No thyroid enlargement, no tenderness.  LUNGS: Normal breath sounds bilaterally, no wheezing, rales,rhonchi or crepitation. No use of accessory muscles of respiration.  CARDIOVASCULAR: S1, S2 normal. No murmurs, rubs, or gallops.  ABDOMEN: Soft, but distended, midline staples in place. improving Erythema around mid staples extending to the sides. Hypoactive Bowel sounds present. No organomegaly or mass.  EXTREMITIES: No pedal  edema, cyanosis, or clubbing.  NEUROLOGIC: Cranial nerves II through XII are intact. Muscle strength 5/5 in all extremities. Sensation intact. Gait not checked.  PSYCHIATRIC: The patient is alert and oriented x 3.  SKIN: No obvious rash, lesion, or ulcer.    LABORATORY PANEL:   CBC  Recent Labs Lab 04/10/17 0500  WBC 10.7*  HGB 11.5*  HCT 32.6*  PLT 323   ------------------------------------------------------------------------------------------------------------------  Chemistries   Recent Labs Lab 04/06/17 0428  04/12/17 0417  NA 135  < > 133*  K 3.6  < > 3.9  CL 103  < > 102  CO2 29  < > 26  GLUCOSE 153*  < > 163*  BUN 22*  < > 17  CREATININE 0.82  < > 0.74  CALCIUM 8.0*  < > 8.4*  MG 1.9  < > 1.9  AST 15  --   --   ALT 22  --   --   ALKPHOS 61  --   --   BILITOT 0.8  --   --   < > = values in this interval not displayed. ------------------------------------------------------------------------------------------------------------------  Cardiac Enzymes No results for input(s): TROPONINI in the last 168 hours. ------------------------------------------------------------------------------------------------------------------  RADIOLOGY:  No results found.  EKG:   Orders placed or performed during the hospital encounter of 03/26/17  . EKG 12-Lead  . EKG 12-Lead  . ED EKG  . ED EKG  . EKG    ASSESSMENT AND PLAN:   71 y/o M with PMH of hypertension, hyperlipidemia admitted with abdominal pain, noted to have SBO  #1 small bowel obstruction- mgmt per surgery - s/p exlap, adhesionolysis and has a G tube  - NG tube out, able to tolerate  liquids,Had a small bowel movement today, plan to advance to regular diet today- then can dc tpn  #2 Abdominal wall cellulitis- around the surgical staples in the middle Started ancef with some improvement, if worsening- will do vancomycin Monitor, margins marked, no area of any fluctuance  #3 Acute urinary retention-  appreciate urology input- foley placed- voiding trial today as patient is more ambulatory. -It starts retaining again, reinsert Foley and will discharge with the Foley for a voiding trial as outpatient On proscar and flomax. Oxybutynin prn for bladder spasms  #4 Left lower lobe atelectasis- aspiration pneumonitis, encourage ambulation, incentive spirometry.  #5 Enterococcus fecalis UTI- on macrobid- stop after total 7 days treatment  #6 DVT Prophylaxis- lovenox    All the records are reviewed and case discussed with Care Management/Social Workerr. Management plans discussed with the patient, family and they are in agreement.  CODE STATUS: Full Code  TOTAL TIME TAKING CARE OF THIS PATIENT: 35 minutes.   POSSIBLE D/C IN 1-2 DAYS, DEPENDING ON CLINICAL CONDITION.   Gladstone Lighter M.D on 04/12/2017 at 11:24 AM  Between 7am to 6pm - Pager - [phone]  After 6pm go to [email or web address] - password [email or web address] [facility]  Office  [phone]  CC: Primary care physician; Guadalupe Maple, MD

## 2017-04-12 NOTE — Progress Notes (Signed)
[facility] NOTE   Pharmacy Consult for TPN monitoring  Patient Measurements: Height: 6\' 1"  (185.4 cm) Weight: 223 lb (101.2 kg) IBW/kg (Calculated) : 79.9 TPN AdjBW (KG): 85.1 Body mass index is 29.42 kg/m.  Assessment:   GI: Full liquids Endo:  Insulin requirements in the past 24 hours: 10 units Lytes: K= 3.9,  Phos= 3.8,  Mag=1.9    Renal:Scr 0.74 Pulm: Cards:  Hepatobil: Neuro: ID:  Best Practices: TPN Access: TPN start date: 04/04/17   Current Nutrition:   Plan:  Continue Clinimix E 5/15 at 30 ml/hr with MVI, trace; 260ml 20% lipid emulsion at 76ml/hr daily per Dr. Bary Castilla. Lactated Ringers  at 10 ml/hr  SSI q6h ordered:  Insulin requirement during last 24 hours:  10 units Electrolytes WNL. F/u for next Electrolytes Pharmacist to order TPN and Lipid order daily per Dr. Bary Castilla.    Olivia Canter, [facility] Clinical Pharmacist 04/12/2017,12:54 PM

## 2017-04-13 LAB — TRIGLYCERIDES: Triglycerides: 118 mg/dL (ref <150)

## 2017-04-13 LAB — CBC
HEMATOCRIT: 35 % — AB (ref 40.0–52.0)
Hemoglobin: 12.1 g/dL — ABNORMAL LOW (ref 13.0–18.0)
MCH: 31.2 pg (ref 26.0–34.0)
MCHC: 34.6 g/dL (ref 32.0–36.0)
MCV: 90 fL (ref 80.0–100.0)
PLATELETS: 468 10*3/uL — AB (ref 150–440)
RBC: 3.89 MIL/uL — ABNORMAL LOW (ref 4.40–5.90)
RDW: 13.4 % (ref 11.5–14.5)
WBC: 12.8 10*3/uL — ABNORMAL HIGH (ref 3.8–10.6)

## 2017-04-13 LAB — HEPATIC FUNCTION PANEL
ALT: 63 U/L (ref 17–63)
AST: 33 U/L (ref 15–41)
Albumin: 2.5 g/dL — ABNORMAL LOW (ref 3.5–5.0)
Alkaline Phosphatase: 88 U/L (ref 38–126)
BILIRUBIN TOTAL: 0.5 mg/dL (ref 0.3–1.2)
Total Protein: 6.4 g/dL — ABNORMAL LOW (ref 6.5–8.1)

## 2017-04-13 LAB — PHOSPHORUS: Phosphorus: 3.7 mg/dL (ref 2.5–4.6)

## 2017-04-13 LAB — BASIC METABOLIC PANEL
Anion gap: 5 (ref 5–15)
BUN: 17 mg/dL (ref 6–20)
CALCIUM: 8.4 mg/dL — AB (ref 8.9–10.3)
CO2: 25 mmol/L (ref 22–32)
CREATININE: 0.8 mg/dL (ref 0.61–1.24)
Chloride: 102 mmol/L (ref 101–111)
GFR calc Af Amer: >60 mL/min (ref >60)
GLUCOSE: 139 mg/dL — AB (ref 65–99)
Potassium: 4 mmol/L (ref 3.5–5.1)
Sodium: 132 mmol/L — ABNORMAL LOW (ref 135–145)

## 2017-04-13 LAB — GLUCOSE, CAPILLARY
GLUCOSE-CAPILLARY: 102 mg/dL — AB (ref 65–99)
Glucose-Capillary: 107 mg/dL — ABNORMAL HIGH (ref 65–99)
Glucose-Capillary: 117 mg/dL — ABNORMAL HIGH (ref 65–99)
Glucose-Capillary: 145 mg/dL — ABNORMAL HIGH (ref 65–99)

## 2017-04-13 LAB — MAGNESIUM: Magnesium: 1.8 mg/dL (ref 1.7–2.4)

## 2017-04-13 NOTE — Progress Notes (Signed)
AVSS. Mild nausea this AM, otherwise without GI symptoms. Ate about 1/2 of breakfast. Lungs: Clear. Cardio: RR. ABD: Soft, non-tender. Redness around umbilical area resolving. GT site clean. Reviewed labs. Will d/c TPN after present bag infused. Tentative d/c in AM.

## 2017-04-13 NOTE — Progress Notes (Signed)
[facility] NOTE   Pharmacy Consult for TPN monitoring  Patient Measurements: Height: 6\' 1"  (185.4 cm) Weight: 223 lb (101.2 kg) IBW/kg (Calculated) : 79.9 TPN AdjBW (KG): 85.1 Body mass index is 29.42 kg/m.  Assessment:   GI: Full liquids Endo:  Insulin requirements in the past 24 hours: 10 units Lytes: K= 4.0,  Phos= 3.7,  Mag=1.8    Renal:Scr 0.74 Pulm: Cards:  Hepatobil: Neuro: ID:  Best Practices: TPN Access: TPN start date: 04/04/17   Current Nutrition:   Plan:  Per Dr. Bary Castilla continue to run Clinimix E 5/15 at 30 ml/hr with MVI, trace until current TPN is finished (approx 1800 on 6/18). TPN will not be ordered tonight 6/18.   Lactated Ringers  at 10 ml/hr  SSI q6h ordered:  Insulin requirement during last 24 hours:  9 units  Electrolytes WNL.   Pernell Dupre, PharmD, BCPS Clinical Pharmacist 04/13/2017 9:18 AM

## 2017-04-13 NOTE — Progress Notes (Signed)
Chaplain made a follow up visit with pt. Pt was in the Rm with his friend at the bedside. Pt stated that he was feeling a bit down because of things that have happened in his life. Pt said he has been in hospital for 14 days, and that his wife almost collapsed few days ago and now she is hospitalized in Orthopidc unit and will be having a surgical procedure to correct her bad knee soon. Pt stated he has been overwhelmed and is drawing strength from his faith in God and support from his son. Pt requested prayers for himself and for his wife to cope with all that is happening to him, which the [facility] provided.

## 2017-04-13 NOTE — Progress Notes (Signed)
Nutrition Follow-up  DOCUMENTATION CODES:   Severe malnutrition in context of acute illness/injury, Obesity unspecified  INTERVENTION:  Recommend Ensure Enlive po BID, each supplement provides 350 kcal and 20 grams of protein.   Agree with plan to discontinue TPN after current bag runs out as patient is tolerating regular diet and the rest of patient's calorie and protein needs can be met with an oral nutrition supplement.  NUTRITION DIAGNOSIS:   Malnutrition (Severe) related to acute illness (partial small bowel obstruction) as evidenced by energy intake < or equal to 50% for > or equal to 5 days, percent weight loss, moderate depletion of body fat.  Ongoing.  GOAL:   Patient will meet greater than or equal to 90% of their needs  Progressing - patient meeting 56% minimum calorie needs and 58% minimum protein needs with diet.  MONITOR:   Diet advancement, Labs, Weight trends, I & O's  REASON FOR ASSESSMENT:   Malnutrition Screening Tool    ASSESSMENT:   71 year old male with PMHx of HLD, HTN presented with vomiting, bloating, and abdominal pain found to have partial SBO.  -Patient was advanced to FLD on 6/15 and Regular diet on 6/17.  -TPN was decreased to 1/2 rate on 6/17. Per Dr. Dwyane Luo note today, plan is to discontinue TPN after current bag. -Per documentation on 6/17 G-tube remains clamped.  Spoke with patient and his wife at bedside. Patient reports he is tolerating the regular diet. He is eating approximately 50% of his meals. Reports he ate 1/2 of his quesadilla for lunch today. For breakfast this morning had 1/2 of eggs and all of his bacon. For dinner last night he had 2 chicken tenders and about 1/2 of his macaroni and cheese. Denies any nausea or abdominal pain. Reports he had one bowel movement.  Access: PICC double lumen placed 04/03/2017  TPN: tolerating Clinimix E 5/15 @ 30 ml/hr + 20% ILE @ 20 ml/hr over 12 hrs  Meal Completion: 100% per chart but  patient reports he is finishing 50% of meals In the past 24 hours patient has had approximately 1236 kcal (56% minimum estimated kcal needs) and 58 grams of protein (58% minimum estimated protein needs).   Medications reviewed and include: Novolog 0-15 units Q6hrs (received 4 units past 24 hrs), pantoprazole, cefazolin.  Labs reviewed: CBG 117-173 past 24 hrs, Sodium 132. Potassium, Magnesium, Phosphorus WNL. Triglycerides 118.  Weight trend: RD obtained bed scale weight of 222.1 lbs. Patient has remained weight stable during admission, but is still 10 lbs below UBW.  Diet Order:  Diet regular Room service appropriate? Yes; Fluid consistency: Thin TPN (CLINIMIX-E) Adult  Skin:  Wound (see comment) (closed incisions on abdomen, cellulitis on abdomen)  Last BM:  04/12/2017 - small type 6  Height:   Ht Readings from Last 1 Encounters:  04/03/17 '6\' 1"'  (1.854 m)    Weight:   Wt Readings from Last 1 Encounters:  04/13/17 222 lb 1.6 oz (100.7 kg)    Ideal Body Weight:  83.6 kg  BMI:  Body mass index is 29.3 kg/m.  Estimated Nutritional Needs:   Kcal:  5573-2202 (MSJ x 1.2-1.3)  Protein:  100-120 grams (1-1.2 grams/kg)  Fluid:  2.2-2.4 L/day  EDUCATION NEEDS:   No education needs identified at this time  Willey Blade, MS, RD, LDN Pager: [phone] After Hours Pager: [phone]

## 2017-04-13 NOTE — Progress Notes (Signed)
Wound staples removed. Seroma below umbilicus drained about 45 cc odorless, straw color serous fluid. Culture obtained. G tube site sutures removed. Dry dressing applied. Redness should resolve quickly now that seroma is drained. Discussed w/ hospitalist: Will d/c Macrobid as patient has completed 10 days of therapy and is voiding well. Continue Kefzol until tomorrow.

## 2017-04-13 NOTE — Progress Notes (Signed)
[place] [facility] [place] NAME: Drew Mcmahon    MR#:  [id number]  DATE OF BIRTH:  December 18, 1945  SUBJECTIVE:  CHIEF COMPLAINT:  No chief complaint on file.  - SBO and s/p partial small bowel resection, tolerated regular diet. Redness on abdomen wall is getting better.  REVIEW OF SYSTEMS:  Review of Systems  Constitutional: Negative for chills, fever and malaise/fatigue.  HENT: Negative for ear discharge, hearing loss, nosebleeds and sinus pain.   Eyes: Negative for blurred vision and double vision.  Respiratory: Negative for cough, shortness of breath and wheezing.   Cardiovascular: Negative for chest pain, palpitations and leg swelling.  Gastrointestinal: Positive for abdominal pain. Negative for constipation, diarrhea, nausea and vomiting.  Genitourinary: Negative for dysuria.  Neurological: Negative for dizziness, seizures and headaches.   DRUG ALLERGIES:  No Known Allergies  VITALS:  Blood pressure 131/76, pulse 64, temperature 97.7 F (36.5 C), temperature source Oral, resp. rate 17, height 6\' 1"  (1.854 m), weight 100.7 kg (222 lb 1.6 oz), SpO2 98 %.  PHYSICAL EXAMINATION:  Physical Exam  GENERAL:  71 y.o.-year-old patient sitting in the bed with no acute distress.  EYES: Pupils equal, round, reactive to light and accommodation. No scleral icterus. Extraocular muscles intact.  HEENT: Head atraumatic, normocephalic. Oropharynx and nasopharynx clear.  NECK:  Supple, no jugular venous distention. No thyroid enlargement, no tenderness.  LUNGS: Normal breath sounds bilaterally, no wheezing, rales,rhonchi or crepitation. No use of accessory muscles of respiration.  CARDIOVASCULAR: S1, S2 normal. No murmurs, rubs, or gallops.  ABDOMEN: Soft, but distended, midline staples in place. improving Erythema around mid staples extending to the sides. Hypoactive Bowel sounds present. No organomegaly or mass.  EXTREMITIES: No pedal edema, cyanosis, or  clubbing.  NEUROLOGIC: Cranial nerves II through XII are intact. Muscle strength 5/5 in all extremities. Sensation intact. Gait not checked.  PSYCHIATRIC: The patient is alert and oriented x 3.  SKIN: No obvious rash, lesion, or ulcer.    LABORATORY PANEL:   CBC  Recent Labs Lab 04/13/17 0705  WBC 12.8*  HGB 12.1*  HCT 35.0*  PLT 468*   ------------------------------------------------------------------------------------------------------------------  Chemistries   Recent Labs Lab 04/13/17 0705  NA 132*  K 4.0  CL 102  CO2 25  GLUCOSE 139*  BUN 17  CREATININE 0.80  CALCIUM 8.4*  MG 1.8  AST 33  ALT 63  ALKPHOS 88  BILITOT 0.5   ------------------------------------------------------------------------------------------------------------------  Cardiac Enzymes No results for input(s): TROPONINI in the last 168 hours. ------------------------------------------------------------------------------------------------------------------  RADIOLOGY:  No results found.  EKG:   Orders placed or performed during the hospital encounter of 03/26/17  . EKG 12-Lead  . EKG 12-Lead  . ED EKG  . ED EKG  . EKG    ASSESSMENT AND PLAN:   71 y/o M with PMH of hypertension, hyperlipidemia admitted with abdominal pain, noted to have SBO  #1 small bowel obstruction- mgmt per surgery - s/p exlap, adhesionolysis and has a G tube  - NG tube out, able to tolerate liquids,Had a small bowel movement today, plan to advance to regular diet , d/c TPN.  #2 Abdominal wall cellulitis- around the surgical staples in the middle Started ancef with some improvement, Dr,Byrnet did drainage of serous fluid already today.  #3 Acute urinary retention- UTI appreciate urology input- foley placed- voiding trial done as patient is more ambulatory. Not urinating without any trouble. On proscar and flomax. Oxybutynin prn for bladder spasms  Enterobacter  per Ur cx.  finished 8-9 days of Abx.  #4  Left lower lobe atelectasis- aspiration pneumonitis, encourage ambulation, incentive spirometry.  #5 Enterococcus fecalis UTI- on macrobid-   #6 DVT Prophylaxis- lovenox  As patient is stable and plan to discharge in a day or 2 by surgical team, I will sign off now. I spoke to surgical physician and he is okay with that.  All the records are reviewed and case discussed with Care Management/Social Workerr. Management plans discussed with the patient, family and they are in agreement.  CODE STATUS: Full Code  TOTAL TIME TAKING CARE OF THIS PATIENT: 35 minutes.   POSSIBLE D/C IN 1-2 DAYS, DEPENDING ON CLINICAL CONDITION.   Vaughan Basta M.D on 04/13/2017 at 5:10 PM  Between 7am to 6pm - Pager - [phone]  After 6pm go to [email or web address] - password [email or web address] [facility]  Office  [phone]  CC: Primary care physician; Guadalupe Maple, MD

## 2017-04-14 ENCOUNTER — Encounter: Payer: Self-pay | Admitting: *Deleted

## 2017-04-14 LAB — GLUCOSE, CAPILLARY: GLUCOSE-CAPILLARY: 110 mg/dL — AB (ref 65–99)

## 2017-04-14 MED ORDER — FINASTERIDE 5 MG PO TABS: 5.0000 mg | ORAL_TABLET | Freq: Every day | ORAL | 11 refills | Status: DC

## 2017-04-14 MED ORDER — TAMSULOSIN HCL 0.4 MG PO CAPS: 0.4000 mg | ORAL_CAPSULE | Freq: Every day | ORAL | 11 refills | Status: DC

## 2017-04-14 NOTE — Care Management (Signed)
Patient to discharge home today.  TPN has been discontinued.  Patient ambulatory.  Patient to discharge with dry dressing change.  Bedside RN to educated on dressing change prior to discharge. Patient to follow up with surgery outpatient. No RNCM needs identified.  RNCM signing off

## 2017-04-14 NOTE — Care Management Important Message (Signed)
Important Message  Patient Details  Name: Drew Mcmahon MRN: [id number] Date of Birth: 1946/04/19   Medicare Important Message Given:  Yes    Beverly Sessions, RN 04/14/2017, 9:42 AM

## 2017-04-15 ENCOUNTER — Telehealth: Payer: Self-pay | Admitting: *Deleted

## 2017-04-15 LAB — AEROBIC CULTURE  (SUPERFICIAL SPECIMEN): SPECIAL REQUESTS: NORMAL

## 2017-04-15 LAB — AEROBIC CULTURE W GRAM STAIN (SUPERFICIAL SPECIMEN)

## 2017-04-15 NOTE — Telephone Encounter (Signed)
-----   Message from [name], MD sent at 04/15/2017  8:03 AM EDT ----- Patient discharged yesterday. Please give a call later this AM to see how his first night at home went.

## 2017-04-15 NOTE — Telephone Encounter (Signed)
He states he is "doing fine". No nausea or vomiting. Pain is described as more soreness. He states he had a BM yesterday. Blood pressure this am was 116/65 and now it is 146/78. Appreciates call. Aware to call back for any questions or new concerns.

## 2017-04-16 ENCOUNTER — Encounter: Payer: Self-pay | Admitting: *Deleted

## 2017-04-21 ENCOUNTER — Ambulatory Visit (INDEPENDENT_AMBULATORY_CARE_PROVIDER_SITE_OTHER): Payer: Medicare Other | Admitting: General Surgery

## 2017-04-21 ENCOUNTER — Encounter: Payer: Self-pay | Admitting: General Surgery

## 2017-04-21 VITALS — BP 132/74 | HR 70 | Resp 14 | Ht 74.0 in | Wt 217.0 lb

## 2017-04-21 DIAGNOSIS — K56609 Unspecified intestinal obstruction, unspecified as to partial versus complete obstruction: Secondary | ICD-10-CM

## 2017-04-21 NOTE — Patient Instructions (Signed)
Return in one week.  

## 2017-04-21 NOTE — Progress Notes (Signed)
Patient ID: Drew Mcmahon, male   DOB: [birth year], 71 y.o.   MRN: [id number]  Chief Complaint  Patient presents with  . Routine Post Op    HPI Drew Mcmahon is a 71 y.o. male here today for his post op exploratory laparoscopy done on 04/01/2017 and small bowel resection done on 04/03/2017. Patient states he is doing well. Moving his bowels daily. He states has not got his appetite back yet.  HPI  Past Medical History:  Diagnosis Date  . Elevated lipids   . Hip fracture ([place]) 2007  . Hyperlipidemia   . Hypertension     Past Surgical History:  Procedure Laterality Date  . APPENDECTOMY  04/03/2017   Incidental APPENDECTOMY;  Surgeon: [name], MD;  Location: [facility] ORS;  Service: General;;  . BOWEL RESECTION  04/03/2017   Procedure: FOCAL SMALL BOWEL RESECTION;  Surgeon: [name], MD;  Location: [facility] ORS;  Service: General;;  . COLONOSCOPY WITH PROPOFOL N/A 06/12/2016   Procedure: COLONOSCOPY WITH PROPOFOL;  Surgeon: Manya Silvas, MD;  Location: [facility] ENDOSCOPY;  Service: Endoscopy;  Laterality: N/A;  . FRACTURE SURGERY Left 2007   hip  . LAPAROSCOPY N/A 04/01/2017   Procedure: LAPAROSCOPY DIAGNOSTIC;  Surgeon: [name], MD;  Location: [facility] ORS;  Service: General;  Laterality: N/A;  . LAPAROTOMY N/A 04/03/2017   Procedure: EXPLORATORY LAPAROTOMY;  Surgeon: [name], MD;  Location: [facility] ORS;  Service: General;  Laterality: N/A;  . LYSIS OF ADHESION  04/03/2017   Procedure: LYSIS OF ADHESION;  Surgeon: [name], MD;  Location: [facility] ORS;  Service: General;;    Family History  Problem Relation Age of Onset  . Hypertension Mother   . Alzheimer's disease Mother   . Hypertension Father   . Cancer Father        lung  . Hypertension Sister   . Hypertension Brother   . Hypertension Sister   . Hypertension Sister   . Hypertension Brother     Social History Social History  Substance Use Topics  . Smoking status: Former Smoker    Types:  Cigarettes    Quit date: 08/26/1985  . Smokeless tobacco: Never Used  . Alcohol use No    No Known Allergies  Current Outpatient Prescriptions  Medication Sig Dispense Refill  . aspirin (ASPIRIN EC) 81 MG EC tablet Take 81 mg by mouth daily. Swallow whole.    . finasteride (PROSCAR) 5 MG tablet Take 1 tablet (5 mg total) by mouth daily. 30 tablet 11  . losartan-hydrochlorothiazide (HYZAAR) 100-25 MG tablet Take 1 tablet by mouth daily. 90 tablet 4  . metoprolol succinate (TOPROL-XL) 50 MG 24 hr tablet Take 1 tablet (50 mg total) by mouth daily. Take with or immediately following a meal. 90 tablet 4  . Multiple Vitamins-Minerals (ONE-A-DAY MENS VITACRAVES PO) Take by mouth.    . tamsulosin (FLOMAX) 0.4 MG CAPS capsule Take 1 capsule (0.4 mg total) by mouth daily. 30 capsule 11  . triamcinolone cream (KENALOG) 0.1 %      No current facility-administered medications for this visit.     Review of Systems Review of Systems  Constitutional: Negative.   Respiratory: Negative.   Cardiovascular: Negative.   Gastrointestinal: Negative.     Blood pressure 132/74, pulse 70, resp. rate 14, height 6\' 2"  (1.88 m), weight 217 lb (98.4 kg).  Physical Exam Physical Exam  Constitutional: He is oriented to person, place, and time. He appears well-developed and well-nourished.  Cardiovascular: Normal rate and normal heart sounds.   Pulmonary/Chest: Effort normal and breath sounds normal.  Abdominal: Soft. Bowel sounds are normal. There is no tenderness. No hernia.    Neurological: He is alert and oriented to person, place, and time.  Skin: Skin is warm and dry.    Data Reviewed Pathology on the small bowel resected showed no etiology for the obstruction.  Fibrotic obliteration of the appendix noted.  Assessment    Doing well status post diagnostic laparoscopy followed by laparotomy for small bowel obstruction of unclear etiology.  Early satiety without abdominal pain.    Plan      The importance of frequent small meals was reviewed. Continue walking for strength.  Dry dressing over the abdominal wound where drainage was undertaken today.     Patient to return in one week. The patient is aware to call back for any questions or concerns.   HPI, Physical Exam, Assessment and Plan have been scribed under the direction and in the presence of Hervey Ard, MD.  Gaspar Cola, CMA  I have completed the exam and reviewed the above documentation for accuracy and completeness.  I agree with the above.  Haematologist has been used and any errors in dictation or transcription are unintentional.  Hervey Ard, M.D., F.A.C.S.  [name] 04/21/2017, 9:27 PM

## 2017-04-26 NOTE — Discharge Summary (Signed)
Physician Discharge Summary  Patient ID: Drew Mcmahon MRN: [id number] DOB/AGE: 1946/08/06 71 y.o.  Admit date: 03/30/2017 Discharge date: 04/26/2017  Admission Diagnoses: Small bowel obstruction  Discharge Diagnoses:  Active Problems:   Small bowel obstruction ([place])   Urinary retention   Urinary tract infection without hematuria   Discharged Condition: good  Hospital Course: The patient had been well until May 30-31, 2018. At this time he developed abdominal pain associated with nausea and vomiting. CT scan obtained to the emergency room in the early morning hours June 1,2018 suggested small bowel obstruction versus an inflammatory process. The patient was discharged home and for the next 3 days he had episodic abdominal pain and vomiting. He was seen by his primary care physician on 03/30/2017 and referred for admission. Repeat CT at that time showed a pattern more consistent with simple small bowel obstruction. The patient had no previous GI surgery. No antecedent illness. No history of pancreatitis.  The patient was treated with nasogastric decompression with improvement in the plain films but prompt recurrence when allowed by mouth. The patient was taken to the operating room for diagnostic laparoscopy in 04/01/2017 at which time an area of transition was identified without any obvious cause. No scarring, evidence of prior entrapment or bowel wall thickening. There was noted to be scarring down near the terminal ileum and the appendix but the dilated bowel was well proximal to this. Over the next 48 hours the patient had persistent dilatation of the proximal and mid small bowel and was to return to the operating room for formal laparotomy on 04/03/2017. At this time exploration showed no additional findings. The right lower quadrant was totally dissected free and an incidental appendectomy completed. The patient had been noted to have developed a hematoma in the small bowel mesentery near the  transition area, and while this was thought to be secondary to grasping forceps during laparoscopy 2 days earlier a wedge resection of the small bowel was undertaken. A side-to-side functional end-to-end anastomosis making use the GIA stapler was completed.  The patient had a gastrostomy tube placed at this time due to his poor tolerance of the NG tube. He was placed on total parenteral nutrition which she tolerated well. Sliding scale insulin coverage was provided. The patient ambulated early and often. Aggressive pulmonary toilet. The patient showed a marked leukocytosis and this was found to be related to urinary tract infection. He had undergone an in and out catheterization of the bladder at the time of his laparoscopy.  The patient was evaluated by the medical service is helping was appreciated. He was covered with antibiotics for enterococcus species and completed a ten-day course.  The patient's GI function slowly returned and he was started on clear liquids and advance slowly to a soft diet.  TPN was weaned and he was subtotally felt to be a candidate for discharge home.  Consults: Hospitalist  Significant Diagnostic Studies: Small bowel follow-through which showed no fixed obstruction completed on post admission day 1.   Treatments: antibiotics: Zosyn and TPN  Discharge Exam: Blood pressure (!) 104/56, pulse 69, temperature 98.2 F (36.8 C), temperature source Oral, resp. rate 20, height 6\' 1"  (1.854 m), weight 222 lb 1.6 oz (100.7 kg), SpO2 100 %. General appearance: alert Resp: clear to auscultation bilaterally Cardio: regular rate and rhythm, S1, S2 normal, no murmur, click, rub or gallop GI: soft, non-tender; bowel sounds normal; no masses,  no organomegaly Incision/Wound: Evidence of a superficial fluid collection near the umbilicus  that was drained without incident. Culture grew coagulase negative staph on post sample day 6 in the broth only. Clinically noninfected.    Disposition: 01-Home or Self Care  Discharge Instructions    Diet - low sodium heart healthy    Complete by:  As directed    Discharge instructions    Complete by:  As directed    OK to shower.  Remove gauze around gastrostomy tube and from abdominal wound prior to showers.  Replace after showers.   Replace gauze along wound as needed.  Tylenol: If needed for soreness.  Milk of magnesia: If needed for constipation.  No driving until comfortable.  No lifting over 10 pounds.   Report any fevers over 101.  Report if you develop any worsening redness at your incisions or the gastrostomy tube site.  Report any nausea or vomiting.   Increase activity slowly    Complete by:  As directed      Allergies as of 04/14/2017   No Known Allergies     Medication List    STOP taking these medications   FISH OIL PO   ondansetron 4 MG disintegrating tablet Commonly known as:  ZOFRAN ODT   simethicone 80 MG chewable tablet Commonly known as:  GAS-X     TAKE these medications   aspirin EC 81 MG EC tablet Generic drug:  aspirin Take 81 mg by mouth daily. Swallow whole.   finasteride 5 MG tablet Commonly known as:  PROSCAR Take 1 tablet (5 mg total) by mouth daily.   losartan-hydrochlorothiazide 100-25 MG tablet Commonly known as:  HYZAAR Take 1 tablet by mouth daily.   metoprolol succinate 50 MG 24 hr tablet Commonly known as:  TOPROL-XL Take 1 tablet (50 mg total) by mouth daily. Take with or immediately following a meal.   ONE-A-DAY MENS VITACRAVES PO Take by mouth.   tamsulosin 0.4 MG Caps capsule Commonly known as:  FLOMAX Take 1 capsule (0.4 mg total) by mouth daily.   triamcinolone cream 0.1 % Commonly known as:  KENALOG      Follow-up Information    [name], MD. Go on 04/21/2017.   Specialties:  General Surgery, Radiology Why:  Dr. Bary Castilla, Tuesday, June 26 at 1:00 p.m., [phone] Contact information: [address] [place]  [place] [address] [phone]           Signed: [name] 04/26/2017, 9:31 AM

## 2017-04-28 ENCOUNTER — Ambulatory Visit (INDEPENDENT_AMBULATORY_CARE_PROVIDER_SITE_OTHER): Payer: Medicare Other | Admitting: General Surgery

## 2017-04-28 VITALS — BP 138/70 | HR 71 | Resp 12 | Ht 72.0 in | Wt 217.0 lb

## 2017-04-28 DIAGNOSIS — K56609 Unspecified intestinal obstruction, unspecified as to partial versus complete obstruction: Secondary | ICD-10-CM

## 2017-04-28 NOTE — Patient Instructions (Signed)
Patient to return in two weeks  

## 2017-04-28 NOTE — Progress Notes (Signed)
Patient ID: Drew Mcmahon, male   DOB: July 09, 1946, 71 y.o.   MRN: [id number]  Chief Complaint  Patient presents with  . Routine Post Op    HPI Drew Mcmahon is a 71 y.o. male here today for his post op exploratory laparotomy done on 04/01/2017 and small bowel resection done on 04/03/2017. Moves his bowels daily. He states he still feels weak, But has been more active about the house. He is eating 6 small meals a day and tolerating these well. He reports one episode where his bowels were little sluggish and this resolved with a single dose of milk of magnesia.. Wife, Drew Mcmahon present at visit.  HPI  Past Medical History:  Diagnosis Date  . Elevated lipids   . Hip fracture ([place]) 2007  . Hyperlipidemia   . Hypertension     Past Surgical History:  Procedure Laterality Date  . APPENDECTOMY  04/03/2017   Incidental APPENDECTOMY;  Surgeon: Drew Bellow, MD;  Location: [facility] ORS;  Service: General;;  . BOWEL RESECTION  04/03/2017   Procedure: FOCAL SMALL BOWEL RESECTION;  Surgeon: Drew Bellow, MD;  Location: [facility] ORS;  Service: General;;  . COLONOSCOPY WITH PROPOFOL N/A 06/12/2016   Procedure: COLONOSCOPY WITH PROPOFOL;  Surgeon: Manya Silvas, MD;  Location: [facility] ENDOSCOPY;  Service: Endoscopy;  Laterality: N/A;  . FRACTURE SURGERY Left 2007   hip  . LAPAROSCOPY N/A 04/01/2017   Procedure: LAPAROSCOPY DIAGNOSTIC;  Surgeon: Drew Bellow, MD;  Location: [place] ORS;  Service: General;  Laterality: N/A;  . LAPAROTOMY N/A 04/03/2017   Procedure: EXPLORATORY LAPAROTOMY;  Surgeon: Drew Bellow, MD;  Location: [facility] ORS;  Service: General;  Laterality: N/A;  . LYSIS OF ADHESION  04/03/2017   Procedure: LYSIS OF ADHESION;  Surgeon: Drew Bellow, MD;  Location: [facility] ORS;  Service: General;;    Family History  Problem Relation Age of Onset  . Hypertension Mother   . Alzheimer's disease Mother   . Hypertension Father   . Cancer Father        lung  . Hypertension Sister   .  Hypertension Brother   . Hypertension Sister   . Hypertension Sister   . Hypertension Brother     Social History Social History  Substance Use Topics  . Smoking status: Former Smoker    Types: Cigarettes    Quit date: 08/26/1985  . Smokeless tobacco: Never Used  . Alcohol use No    No Known Allergies  Current Outpatient Prescriptions  Medication Sig Dispense Refill  . aspirin (ASPIRIN EC) 81 MG EC tablet Take 81 mg by mouth daily. Swallow whole.    . finasteride (PROSCAR) 5 MG tablet Take 1 tablet (5 mg total) by mouth daily. 30 tablet 11  . losartan-hydrochlorothiazide (HYZAAR) 100-25 MG tablet Take 1 tablet by mouth daily. 90 tablet 4  . metoprolol succinate (TOPROL-XL) 50 MG 24 hr tablet Take 1 tablet (50 mg total) by mouth daily. Take with or immediately following a meal. 90 tablet 4  . Multiple Vitamins-Minerals (ONE-A-DAY MENS VITACRAVES PO) Take by mouth.    . tamsulosin (FLOMAX) 0.4 MG CAPS capsule Take 1 capsule (0.4 mg total) by mouth daily. 30 capsule 11  . triamcinolone cream (KENALOG) 0.1 %      No current facility-administered medications for this visit.     Review of Systems Review of Systems  Constitutional: Negative.   Respiratory: Negative.   Cardiovascular: Negative.     Blood pressure 138/70, pulse 71,  resp. rate 12, height 6' (1.829 m), weight 217 lb (98.4 kg).  Physical Exam Physical Exam  Constitutional: He is oriented to person, place, and time. He appears well-developed and well-nourished.  Eyes: Conjunctivae are normal. No scleral icterus.  Neck: Neck supple.  Cardiovascular: Normal rate, regular rhythm and normal heart sounds.   Pulmonary/Chest: Effort normal and breath sounds normal.  Abdominal: Soft. Bowel sounds are normal. There is no tenderness.  Lymphadenopathy:    He has no cervical adenopathy.  Neurological: He is alert and oriented to person, place, and time.  Skin: Skin is warm and dry.  Drain removal   Data  Reviewed Gastrostomy tube was removed without incident. He did have about 90-120 mL of gastric contents drain immediately afterwards which responded to a gauze dressing.  Assessment    Resolution of previous unexplained small bowel obstruction.    Plan    The patient's weight is stable, but I anticipate gains over the next week or 2. The importance of nutritional supplements was reviewed.      Patient to return in two weeks.   HPI, Physical Exam, Assessment and Plan have been scribed under the direction and in the presence of Drew Ard, MD.  Drew Mcmahon, CMA  I have completed the exam and reviewed the above documentation for accuracy and completeness.  I agree with the above.  Drew Mcmahon has been used and any errors in dictation or transcription are unintentional.  Drew Mcmahon, M.D., F.A.C.S.   Drew Mcmahon 04/29/2017, 8:32 AM

## 2017-05-07 ENCOUNTER — Other Ambulatory Visit: Payer: Self-pay

## 2017-05-07 DIAGNOSIS — R350 Frequency of micturition: Secondary | ICD-10-CM

## 2017-05-08 ENCOUNTER — Encounter: Payer: Self-pay | Admitting: Urology

## 2017-05-08 ENCOUNTER — Ambulatory Visit (INDEPENDENT_AMBULATORY_CARE_PROVIDER_SITE_OTHER): Payer: Medicare Other | Admitting: Urology

## 2017-05-08 ENCOUNTER — Other Ambulatory Visit
Admission: RE | Admit: 2017-05-08 | Discharge: 2017-05-08 | Disposition: A | Discharge status: Home / Self Care | Payer: Medicare Other | Source: Ambulatory Visit | Attending: Urology | Admitting: Urology

## 2017-05-08 VITALS — BP 116/57 | HR 69 | Ht 74.0 in | Wt 220.0 lb

## 2017-05-08 DIAGNOSIS — N401 Enlarged prostate with lower urinary tract symptoms: Secondary | ICD-10-CM | POA: Insufficient documentation

## 2017-05-08 DIAGNOSIS — R350 Frequency of micturition: Secondary | ICD-10-CM | POA: Diagnosis not present

## 2017-05-08 DIAGNOSIS — Z125 Encounter for screening for malignant neoplasm of prostate: Secondary | ICD-10-CM | POA: Diagnosis not present

## 2017-05-08 DIAGNOSIS — R339 Retention of urine, unspecified: Secondary | ICD-10-CM

## 2017-05-08 LAB — URINALYSIS, COMPLETE (UACMP) WITH MICROSCOPIC
BILIRUBIN URINE: NEGATIVE
Bacteria, UA: NONE SEEN
GLUCOSE, UA: NEGATIVE mg/dL
HGB URINE DIPSTICK: NEGATIVE
KETONES UR: NEGATIVE mg/dL
LEUKOCYTES UA: NEGATIVE
Nitrite: NEGATIVE
PROTEIN: NEGATIVE mg/dL
RBC / HPF: NONE SEEN RBC/hpf (ref 0–5)
Specific Gravity, Urine: 1.02 (ref 1.005–1.030)
pH: 5.5 (ref 5.0–8.0)

## 2017-05-08 LAB — PSA: Prostatic Specific Antigen: 4.11 ng/mL — ABNORMAL HIGH (ref 0.00–4.00)

## 2017-05-08 LAB — BLADDER SCAN AMB NON-IMAGING

## 2017-05-08 NOTE — Progress Notes (Signed)
05/08/2017 2:58 PM   Drew Mcmahon 1946-02-01 [id number]  Referring provider: Guadalupe Maple, MD [address] [place], [place] [address]  Chief Complaint  Patient presents with  . Benign Prostatic Hypertrophy    New Patient    HPI: 71 yo M Seen and evaluated during a recent admission for small bowel structure and. During that admission, he developed difficulty voiding with somewhat elevated postvoid residuals up to 300+ cc. Ultimately, he had a Foley catheter for several days. He was started on Flomax and finasteride during the admission. He was also treated for enterococcus UTI.  Prior to the admission, he had no personal history of retention. He does report that at baseline prior to starting medication, he had increasing issues with nocturia 4-5, daytime frequency, urgency. Also, history med grown weaker. He never had a urological evaluation.  Since discharge and starting on BPH meds, his symptoms have improved dramatically. IPSS today 3/1.  See below.  Specifically, he reports an improvement in his obstructive voiding symptoms including improved stream as well as decreased frequency and nocturia. He is pleased with his symptoms.  PVR today 110 cc.  Calculated prostate volume 6.2 cm x 5 x 5 = 81 cc  No recent PSA.      IPSS    Row Name 05/08/17 1400         International Prostate Symptom Score   How often have you had the sensation of not emptying your bladder? Less than 1 in 5     How often have you had to urinate less than every two hours? Less than 1 in 5 times     How often have you found you stopped and started again several times when you urinated? Less than 1 in 5 times     How often have you found it difficult to postpone urination? Not at All     How often have you had a weak urinary stream? Not at All     How often have you had to strain to start urination? Not at All     How many times did you typically get up at night to urinate? None     Total IPSS Score  3       Quality of Life due to urinary symptoms   If you were to spend the rest of your life with your urinary condition just the way it is now how would you feel about that? Pleased        Score:  1-7 Mild 8-19 Moderate 20-35 Severe  PMH: Past Medical History:  Diagnosis Date  . Elevated lipids   . Hip fracture ([place]) 2007  . Hyperlipidemia   . Hypertension     Surgical History: Past Surgical History:  Procedure Laterality Date  . APPENDECTOMY  04/03/2017   Incidental APPENDECTOMY;  Surgeon: [name], MD;  Location: [facility] ORS;  Service: General;;  . BOWEL RESECTION  04/03/2017   Procedure: FOCAL SMALL BOWEL RESECTION;  Surgeon: [name], MD;  Location: [facility] ORS;  Service: General;;  . COLONOSCOPY WITH PROPOFOL N/A 06/12/2016   Procedure: COLONOSCOPY WITH PROPOFOL;  Surgeon: Manya Silvas, MD;  Location: [facility] ENDOSCOPY;  Service: Endoscopy;  Laterality: N/A;  . FRACTURE SURGERY Left 2007   hip  . LAPAROSCOPY N/A 04/01/2017   Procedure: LAPAROSCOPY DIAGNOSTIC;  Surgeon: [name], MD;  Location: [facility] ORS;  Service: General;  Laterality: N/A;  . LAPAROTOMY N/A 04/03/2017   Procedure: EXPLORATORY LAPAROTOMY;  Surgeon: [name], MD;  Location: [facility] ORS;  Service: General;  Laterality: N/A;  . LYSIS OF ADHESION  04/03/2017   Procedure: LYSIS OF ADHESION;  Surgeon: [name], MD;  Location: [facility] ORS;  Service: General;;    Home Medications:  Allergies as of 05/08/2017   No Known Allergies     Medication List       Accurate as of 05/08/17  2:58 PM. Always use your most recent med list.          aspirin EC 81 MG EC tablet Generic drug:  aspirin Take 81 mg by mouth daily. Swallow whole.   finasteride 5 MG tablet Commonly known as:  PROSCAR Take 1 tablet (5 mg total) by mouth daily.   losartan-hydrochlorothiazide 100-25 MG tablet Commonly known as:  HYZAAR Take 1 tablet by mouth daily.   metoprolol succinate 50 MG 24 hr  tablet Commonly known as:  TOPROL-XL Take 1 tablet (50 mg total) by mouth daily. Take with or immediately following a meal.   ONE-A-DAY MENS VITACRAVES PO Take by mouth.   tamsulosin 0.4 MG Caps capsule Commonly known as:  FLOMAX Take 1 capsule (0.4 mg total) by mouth daily.   triamcinolone cream 0.1 % Commonly known as:  KENALOG       Allergies: No Known Allergies  Family History: Family History  Problem Relation Age of Onset  . Hypertension Mother   . Alzheimer's disease Mother   . Hypertension Father   . Cancer Father        lung  . Hypertension Sister   . Hypertension Brother   . Hypertension Sister   . Hypertension Sister   . Hypertension Brother   . Prostate cancer Neg Hx   . Kidney cancer Neg Hx     Social History:  reports that he quit smoking about 31 years ago. His smoking use included Cigarettes. He has never used smokeless tobacco. He reports that he does not drink alcohol or use drugs.  ROS: UROLOGY Frequent Urination?: Yes Hard to postpone urination?: Yes Burning/pain with urination?: Yes Get up at night to urinate?: Yes Leakage of urine?: No Urine stream starts and stops?: No Trouble starting stream?: Yes Do you have to strain to urinate?: No Blood in urine?: No Urinary tract infection?: No Sexually transmitted disease?: No Injury to kidneys or bladder?: No Painful intercourse?: No Weak stream?: No Erection problems?: No Penile pain?: No  Gastrointestinal Nausea?: Yes Vomiting?: Yes Indigestion/heartburn?: Yes Diarrhea?: Yes Constipation?: Yes  Constitutional Fever: Yes Night sweats?: No Weight loss?: No Fatigue?: Yes  Skin Skin rash/lesions?: No Itching?: No  Eyes Blurred vision?: No Double vision?: No  Ears/Nose/Throat Sore throat?: No Sinus problems?: No  Hematologic/Lymphatic Swollen glands?: No Easy bruising?: No  Cardiovascular Leg swelling?: No Chest pain?: No  Respiratory Cough?: No Shortness of  breath?: No  Endocrine Excessive thirst?: No  Musculoskeletal Back pain?: Yes Joint pain?: No  Neurological Headaches?: Yes Dizziness?: Yes  Psychologic Depression?: No Anxiety?: No  Physical Exam: BP (!) 116/57   Pulse 69   Ht 6\' 2"  (1.88 m)   Wt 220 lb (99.8 kg)   BMI 28.25 kg/m   Constitutional:  Alert and oriented, No acute distress.  Accompanied by wife. HEENT: [place] AT, moist mucus membranes.  Trachea midline, no masses. Cardiovascular: No clubbing, cyanosis, or edema. Respiratory: Normal respiratory effort, no increased work of breathing. GI: Abdomen is soft, nontender, nondistended, no abdominal masses GU: No CVA tenderness.  Rectal: Normal sphincter  tone, 50+ cc prostate, nonnodular, nontender. Skin: No rashes, bruises or suspicious lesions. Neurologic: Grossly intact, no focal deficits, moving all 4 extremities. Psychiatric: Normal mood and affect.  Laboratory Data: Lab Results  Component Value Date   WBC 12.8 (H) 04/13/2017   HGB 12.1 (L) 04/13/2017   HCT 35.0 (L) 04/13/2017   MCV 90.0 04/13/2017   PLT 468 (H) 04/13/2017    Lab Results  Component Value Date   CREATININE 0.80 04/13/2017    Urinalysis Results for orders placed or performed during the hospital encounter of 05/08/17  Urinalysis, Complete w Microscopic  Result Value Ref Range   Color, Urine YELLOW YELLOW   APPearance CLEAR CLEAR   Specific Gravity, Urine 1.020 1.005 - 1.030   pH 5.5 5.0 - 8.0   Glucose, UA NEGATIVE NEGATIVE mg/dL   Hgb urine dipstick NEGATIVE NEGATIVE   Bilirubin Urine NEGATIVE NEGATIVE   Ketones, ur NEGATIVE NEGATIVE mg/dL   Protein, ur NEGATIVE NEGATIVE mg/dL   Nitrite NEGATIVE NEGATIVE   Leukocytes, UA NEGATIVE NEGATIVE   Squamous Epithelial / LPF 0-5 (A) NONE SEEN   WBC, UA 0-5 0 - 5 WBC/hpf   RBC / HPF NONE SEEN 0 - 5 RBC/hpf   Bacteria, UA NONE SEEN NONE SEEN    Pertinent Imaging: PVR bladder scan 110 cc  Assessment & Plan:   1. Benign prostatic  hyperplasia with lower urinary tract symptoms, symptom details unspecified Large prostate gland with Mass effect into bladder, actively 81 cc prior to finasteride Dramatically improved on tamsulosin/Flomax, continue his medication He is currently pleased with his symptoms, discussed that if his symptoms recur or worsen, would highly consider outlet procedure - PSA; Future - BLADDER SCAN AMB NON-IMAGING  2. Incomplete bladder emptying Mildly elevated postvoid residual today, we'll continue to follow Warning symptoms of urinary retention discussed Creatinine preserved  3. Prostate cancer screening Rectal exam today enlarged without nodules PSA drawn today, will call with results   Return in about 6 months (around 11/08/2017) for IPSS, PVR.  Hollice Espy, MD  [facility] [address] [place],  [address] [phone]

## 2017-05-11 ENCOUNTER — Telehealth: Payer: Self-pay

## 2017-05-11 DIAGNOSIS — R972 Elevated prostate specific antigen [PSA]: Secondary | ICD-10-CM

## 2017-05-11 NOTE — Telephone Encounter (Signed)
-----   Message from Hollice Espy, MD sent at 05/09/2017  1:49 PM EDT ----- PSA is mildly elevated but given her age and size of prostate, it may be appropriate. I would like to recheck this value in 6 months to ensure stability and assess finasteride effect (which should lower PSA by ~50% over 6 months).  Hollice Espy, MD

## 2017-05-11 NOTE — Telephone Encounter (Signed)
Patient notified and will keep follow up in 6 months will come a few days early for PSA draw to discuss results at apt. Order placed

## 2017-05-19 ENCOUNTER — Ambulatory Visit (INDEPENDENT_AMBULATORY_CARE_PROVIDER_SITE_OTHER): Payer: Medicare Other | Admitting: General Surgery

## 2017-05-19 ENCOUNTER — Encounter: Payer: Self-pay | Admitting: General Surgery

## 2017-05-19 VITALS — BP 132/74 | HR 62 | Resp 14 | Ht 72.0 in | Wt 224.0 lb

## 2017-05-19 DIAGNOSIS — K56609 Unspecified intestinal obstruction, unspecified as to partial versus complete obstruction: Secondary | ICD-10-CM

## 2017-05-19 NOTE — Patient Instructions (Signed)
Patient to return  In one month. The patient is aware to call back for any questions or concerns.

## 2017-05-19 NOTE — Progress Notes (Signed)
Patient ID: Drew Mcmahon, male   DOB: October 13, 1946, 71 y.o.   MRN: [id number]  Chief Complaint  Patient presents with  . Follow-up    HPI Drew Mcmahon is a 71 y.o. male here today for his follow up exploratory laparotomy done on 04/01/2017 and small bowel resection done on 04/03/2017. Moves his bowels daily . He states a week ago he started having some sharp pain on his left lower quadrant. Pain only last for seconds, comes and goes.                                                          Appetite markedly improved.  Marland KitchenHPI  Past Medical History:  Diagnosis Date  . Elevated lipids   . Hip fracture ([place]) 2007  . Hyperlipidemia   . Hypertension     Past Surgical History:  Procedure Laterality Date  . APPENDECTOMY  04/03/2017   Incidental APPENDECTOMY;  Surgeon: [name], MD;  Location: [facility] ORS;  Service: General;;  . BOWEL RESECTION  04/03/2017   Procedure: FOCAL SMALL BOWEL RESECTION;  Surgeon: [name], MD;  Location: [facility] ORS;  Service: General;;  . COLONOSCOPY WITH PROPOFOL N/A 06/12/2016   Procedure: COLONOSCOPY WITH PROPOFOL;  Surgeon: Manya Silvas, MD;  Location[facility] ENDOSCOPY;  Service: Endoscopy;  Laterality: N/A;  . FRACTURE SURGERY Left 2007   hip  . LAPAROSCOPY N/A 04/01/2017   Procedure: LAPAROSCOPY DIAGNOSTIC;  Surgeon: [name], MD;  Location: [place] ORS;  Service: General;  Laterality: N/A;  . LAPAROTOMY N/A 04/03/2017   Procedure: EXPLORATORY LAPAROTOMY;  Surgeon: [name], MD;  Location: [facility] ORS;  Service: General;  Laterality: N/A;  . LYSIS OF ADHESION  04/03/2017   Procedure: LYSIS OF ADHESION;  Surgeon: [name], MD;  Location: [facility] ORS;  Service: General;;    Family History  Problem Relation Age of Onset  . Hypertension Mother   . Alzheimer's disease Mother   . Hypertension Father   . Cancer Father        lung  . Hypertension Sister   . Hypertension Brother   . Hypertension Sister   . Hypertension Sister   .  Hypertension Brother   . Prostate cancer Neg Hx   . Kidney cancer Neg Hx     Social History Social History  Substance Use Topics  . Smoking status: Former Smoker    Types: Cigarettes    Quit date: 08/26/1985  . Smokeless tobacco: Never Used  . Alcohol use No    No Known Allergies  Current Outpatient Prescriptions  Medication Sig Dispense Refill  . aspirin (ASPIRIN EC) 81 MG EC tablet Take 81 mg by mouth daily. Swallow whole.    . finasteride (PROSCAR) 5 MG tablet Take 1 tablet (5 mg total) by mouth daily. 30 tablet 11  . losartan-hydrochlorothiazide (HYZAAR) 100-25 MG tablet Take 1 tablet by mouth daily. 90 tablet 4  . metoprolol succinate (TOPROL-XL) 50 MG 24 hr tablet Take 1 tablet (50 mg total) by mouth daily. Take with or immediately following a meal. 90 tablet 4  . Multiple Vitamins-Minerals (ONE-A-DAY MENS VITACRAVES PO) Take by mouth.    . tamsulosin (FLOMAX) 0.4 MG CAPS capsule Take 1 capsule (0.4 mg total) by mouth daily. 30 capsule 11  . triamcinolone cream (  KENALOG) 0.1 %      No current facility-administered medications for this visit.     Review of Systems Review of Systems  Constitutional: Negative.   Respiratory: Negative.   Cardiovascular: Negative.   Gastrointestinal: Positive for abdominal pain.    Blood pressure 132/74, pulse 62, resp. rate 14, height 6' (1.829 m), weight 224 lb (101.6 kg).  Physical Exam Physical Exam  Constitutional: He is oriented to person, place, and time. He appears well-developed and well-nourished.  Eyes: Conjunctivae are normal. No scleral icterus.  Neck: Neck supple.  Cardiovascular: Normal rate, regular rhythm and normal heart sounds.   No peripheral edema.  Pulmonary/Chest: Effort normal and breath sounds normal.  Abdominal: Soft. Normal appearance and bowel sounds are normal. There is no hepatomegaly. There is no tenderness.    Lymphadenopathy:    He has no cervical adenopathy.  Neurological: He is alert and  oriented to person, place, and time.  Skin: Skin is warm and dry.      Assessment    Marked improvement in general sense of well-being and weight gain.    Plan      Patient to return  In one month. The patient is aware to call back for any questions or concerns.    HPI, Physical Exam, Assessment and Plan have been scribed under the direction and in the presence of Drew Ard, MD.  Drew Mcmahon, CMA  I have completed the exam and reviewed the above documentation for accuracy and completeness.  I agree with the above.  Haematologist has been used and any errors in dictation or transcription are unintentional.  Drew Mcmahon, M.D., F.A.C.S.  [name] 05/19/2017, 12:24 PM

## 2017-06-18 ENCOUNTER — Encounter: Payer: Self-pay | Admitting: General Surgery

## 2017-06-18 ENCOUNTER — Ambulatory Visit (INDEPENDENT_AMBULATORY_CARE_PROVIDER_SITE_OTHER): Payer: Medicare Other | Admitting: General Surgery

## 2017-06-18 VITALS — BP 140/78 | HR 54 | Resp 14 | Ht 74.0 in | Wt 225.0 lb

## 2017-06-18 DIAGNOSIS — K56609 Unspecified intestinal obstruction, unspecified as to partial versus complete obstruction: Secondary | ICD-10-CM

## 2017-06-18 NOTE — Progress Notes (Signed)
Patient ID: Drew Mcmahon, male   DOB: August 28, 1946, 71 y.o.   MRN: [id number]  Chief Complaint  Patient presents with  . Routine Post Op    HPI Drew Mcmahon is a 71 y.o. male.  here today for his follow up exploratory laparotomy done on 04/01/2017 and small bowel resection done on 04/03/2017. Moves his bowels daily, no bleeding. Pain is much improved. Weight has improved. He is here with his wife, Drew Mcmahon.  HPI  Past Medical History:  Diagnosis Date  . Elevated lipids   . Hip fracture ([place]) 2007  . Hyperlipidemia   . Hypertension     Past Surgical History:  Procedure Laterality Date  . APPENDECTOMY  04/03/2017   Incidental APPENDECTOMY;  Surgeon: [name], MD;  Location: [facility] ORS;  Service: General;;  . BOWEL RESECTION  04/03/2017   Procedure: FOCAL SMALL BOWEL RESECTION;  Surgeon: [name], MD;  Location: [facility] ORS;  Service: General;;  . COLONOSCOPY WITH PROPOFOL N/A 06/12/2016   Procedure: COLONOSCOPY WITH PROPOFOL;  Surgeon: Manya Silvas, MD;  Location: [facility] ENDOSCOPY;  Service: Endoscopy;  Laterality: N/A;  . FRACTURE SURGERY Left 2007   hip  . LAPAROSCOPY N/A 04/01/2017   Procedure: LAPAROSCOPY DIAGNOSTIC;  Surgeon: [name], MD;  Location: [facility] ORS;  Service: General;  Laterality: N/A;  . LAPAROTOMY N/A 04/03/2017   Procedure: EXPLORATORY LAPAROTOMY;  Surgeon: [name], MD;  Location: [facility] ORS;  Service: General;  Laterality: N/A;  . LYSIS OF ADHESION  04/03/2017   Procedure: LYSIS OF ADHESION;  Surgeon: [name], MD;  Location: [facility] ORS;  Service: General;;    Family History  Problem Relation Age of Onset  . Hypertension Mother   . Alzheimer's disease Mother   . Hypertension Father   . Cancer Father        lung  . Hypertension Sister   . Hypertension Brother   . Hypertension Sister   . Hypertension Sister   . Hypertension Brother   . Prostate cancer Neg Hx   . Kidney cancer Neg Hx     Social History Social  History  Substance Use Topics  . Smoking status: Former Smoker    Types: Cigarettes    Quit date: 08/26/1985  . Smokeless tobacco: Never Used  . Alcohol use No    No Known Allergies  Current Outpatient Prescriptions  Medication Sig Dispense Refill  . aspirin (ASPIRIN EC) 81 MG EC tablet Take 81 mg by mouth daily. Swallow whole.    . finasteride (PROSCAR) 5 MG tablet Take 1 tablet (5 mg total) by mouth daily. 30 tablet 11  . losartan-hydrochlorothiazide (HYZAAR) 100-25 MG tablet Take 1 tablet by mouth daily. 90 tablet 4  . metoprolol succinate (TOPROL-XL) 50 MG 24 hr tablet Take 1 tablet (50 mg total) by mouth daily. Take with or immediately following a meal. 90 tablet 4  . Multiple Vitamins-Minerals (ONE-A-DAY MENS VITACRAVES PO) Take by mouth.    . tamsulosin (FLOMAX) 0.4 MG CAPS capsule Take 1 capsule (0.4 mg total) by mouth daily. 30 capsule 11  . triamcinolone cream (KENALOG) 0.1 %      No current facility-administered medications for this visit.     Review of Systems Review of Systems  Constitutional: Negative.   Respiratory: Negative.   Cardiovascular: Negative.   Gastrointestinal: Negative for diarrhea and nausea.    Blood pressure 140/78, pulse (!) 54, resp. rate 14, height 6\' 2"  (1.88 m), weight 225 lb (102.1  kg).  Physical Exam Physical Exam  Constitutional: He is oriented to person, place, and time. He appears well-developed and well-nourished.  HENT:  Mouth/Throat: Oropharynx is clear and moist.  Eyes: Conjunctivae are normal. No scleral icterus.  Neck: Neck supple.  Cardiovascular: Normal rate, regular rhythm and normal heart sounds.   Pulmonary/Chest: Effort normal and breath sounds normal.  Abdominal: Soft. Normal appearance and bowel sounds are normal. There is no tenderness. No hernia.    Diastasis recti present  Lymphadenopathy:    He has no cervical adenopathy.  Neurological: He is alert and oriented to person, place, and time.  Skin: Skin is warm  and dry.  Psychiatric: His behavior is normal.    Data Reviewed Weight is up 5 pounds from his last visit.  Assessment    Doing well status post exploration for idiopathic small bowel obstruction, incidental appendectomy.    Plan         Recommend weight stability.  Follow up as needed. The patient is aware to call back for any questions or new concerns.   HPI, Physical Exam, Assessment and Plan have been scribed under the direction and in the presence of [name], MD. Karie Fetch, RN  I have completed the exam and reviewed the above documentation for accuracy and completeness.  I agree with the above.  Haematologist has been used and any errors in dictation or transcription are unintentional.  Hervey Ard, M.D., F.A.C.S.  Karie Fetch M 06/18/2017, 9:58 AM

## 2017-06-18 NOTE — Patient Instructions (Addendum)
The patient is aware to call back for any questions or concerns. Recommend weight stability.

## 2017-07-01 DIAGNOSIS — R0781 Pleurodynia: Secondary | ICD-10-CM | POA: Diagnosis not present

## 2017-07-01 DIAGNOSIS — M545 Low back pain: Secondary | ICD-10-CM | POA: Diagnosis not present

## 2017-07-21 ENCOUNTER — Ambulatory Visit: Payer: Medicare Other | Admitting: Family Medicine

## 2017-07-30 ENCOUNTER — Other Ambulatory Visit: Payer: Self-pay

## 2017-07-30 NOTE — Patient Outreach (Signed)
[facility]) Care Management  07/30/2017  Drew Mcmahon Sep 16, 1946 [id number]   Medication Adherence call to Drew Mcmahon we call Drew Mcmahon because he is showing up past due under [facility]. On his losartan /hctz 100/25 patient did not answer.we call [name] his regular  Pharmacy they said patient already pick up his medication on 07/30/17 for a ninety days supply patient wont be due until three more month from now.    [facility] Direct Dial [phone]  Fax [phone] [email or web address]

## 2017-08-04 DIAGNOSIS — H00024 Hordeolum internum left upper eyelid: Secondary | ICD-10-CM | POA: Diagnosis not present

## 2017-08-04 DIAGNOSIS — H35373 Puckering of macula, bilateral: Secondary | ICD-10-CM | POA: Diagnosis not present

## 2017-08-04 DIAGNOSIS — D3131 Benign neoplasm of right choroid: Secondary | ICD-10-CM | POA: Diagnosis not present

## 2017-08-04 DIAGNOSIS — H18601 Keratoconus, unspecified, right eye: Secondary | ICD-10-CM | POA: Diagnosis not present

## 2017-08-04 DIAGNOSIS — H524 Presbyopia: Secondary | ICD-10-CM | POA: Diagnosis not present

## 2017-10-05 ENCOUNTER — Ambulatory Visit: Payer: Medicare Other | Admitting: General Surgery

## 2017-11-06 ENCOUNTER — Ambulatory Visit: Payer: Medicare Other | Admitting: Urology

## 2017-11-12 ENCOUNTER — Telehealth: Payer: Self-pay | Admitting: Family Medicine

## 2017-11-12 MED ORDER — HYDROCHLOROTHIAZIDE 25 MG PO TABS: 25.0000 mg | ORAL_TABLET | Freq: Every day | ORAL | 1 refills | Status: DC

## 2017-11-12 MED ORDER — BENAZEPRIL HCL 40 MG PO TABS: 40.0000 mg | ORAL_TABLET | Freq: Every day | ORAL | 1 refills | Status: DC

## 2017-11-12 NOTE — Telephone Encounter (Signed)
Spoke with patient. He would like medication changed and sent to [facility]. Would like 90 day supply

## 2017-11-12 NOTE — Telephone Encounter (Signed)
Issue with [facility] not carrying this medication due to recall losartan-hydrochlorothiazide (HYZAAR) 100-25 MG tablet [[id number]]

## 2018-01-04 ENCOUNTER — Other Ambulatory Visit: Payer: Self-pay

## 2018-01-04 ENCOUNTER — Telehealth: Payer: Self-pay | Admitting: *Deleted

## 2018-01-04 MED ORDER — TAMSULOSIN HCL 0.4 MG PO CAPS: 0.4000 mg | ORAL_CAPSULE | Freq: Every day | ORAL | 0 refills | Status: DC

## 2018-01-04 MED ORDER — HYDROCHLOROTHIAZIDE 25 MG PO TABS: 25.0000 mg | ORAL_TABLET | Freq: Every day | ORAL | 0 refills | Status: DC

## 2018-01-04 NOTE — Telephone Encounter (Signed)
-----   Message from [name], MD sent at 01/02/2018  9:09 AM EST ----- Patient should contact PCP for refill on prostate medications. Thanks.

## 2018-01-04 NOTE — Telephone Encounter (Signed)
Left message on cell phone. Notified patient as instructed.

## 2018-01-04 NOTE — Telephone Encounter (Signed)
Patient has upcoming appointment 01/14/18.

## 2018-01-13 DIAGNOSIS — L570 Actinic keratosis: Secondary | ICD-10-CM | POA: Diagnosis not present

## 2018-01-13 DIAGNOSIS — B372 Candidiasis of skin and nail: Secondary | ICD-10-CM | POA: Diagnosis not present

## 2018-01-13 DIAGNOSIS — L280 Lichen simplex chronicus: Secondary | ICD-10-CM | POA: Diagnosis not present

## 2018-01-13 DIAGNOSIS — L578 Other skin changes due to chronic exposure to nonionizing radiation: Secondary | ICD-10-CM | POA: Diagnosis not present

## 2018-01-14 ENCOUNTER — Encounter: Payer: Medicare Other | Admitting: Family Medicine

## 2018-01-14 ENCOUNTER — Ambulatory Visit: Payer: Medicare Other

## 2018-01-18 ENCOUNTER — Other Ambulatory Visit: Payer: Self-pay | Admitting: General Surgery

## 2018-01-23 IMAGING — CR DG ABDOMEN 2V
3 series · 3 of 3 positions shown · non-contrast
Comparison: 04/03/2017.

CLINICAL DATA: Small bowel obstruction. Exploratory laparotomy.
Lysis of adhesions. Nasogastric tube removed.

EXAM:
ABDOMEN - 2 VIEW

[abdomen erect (1 of 2)]
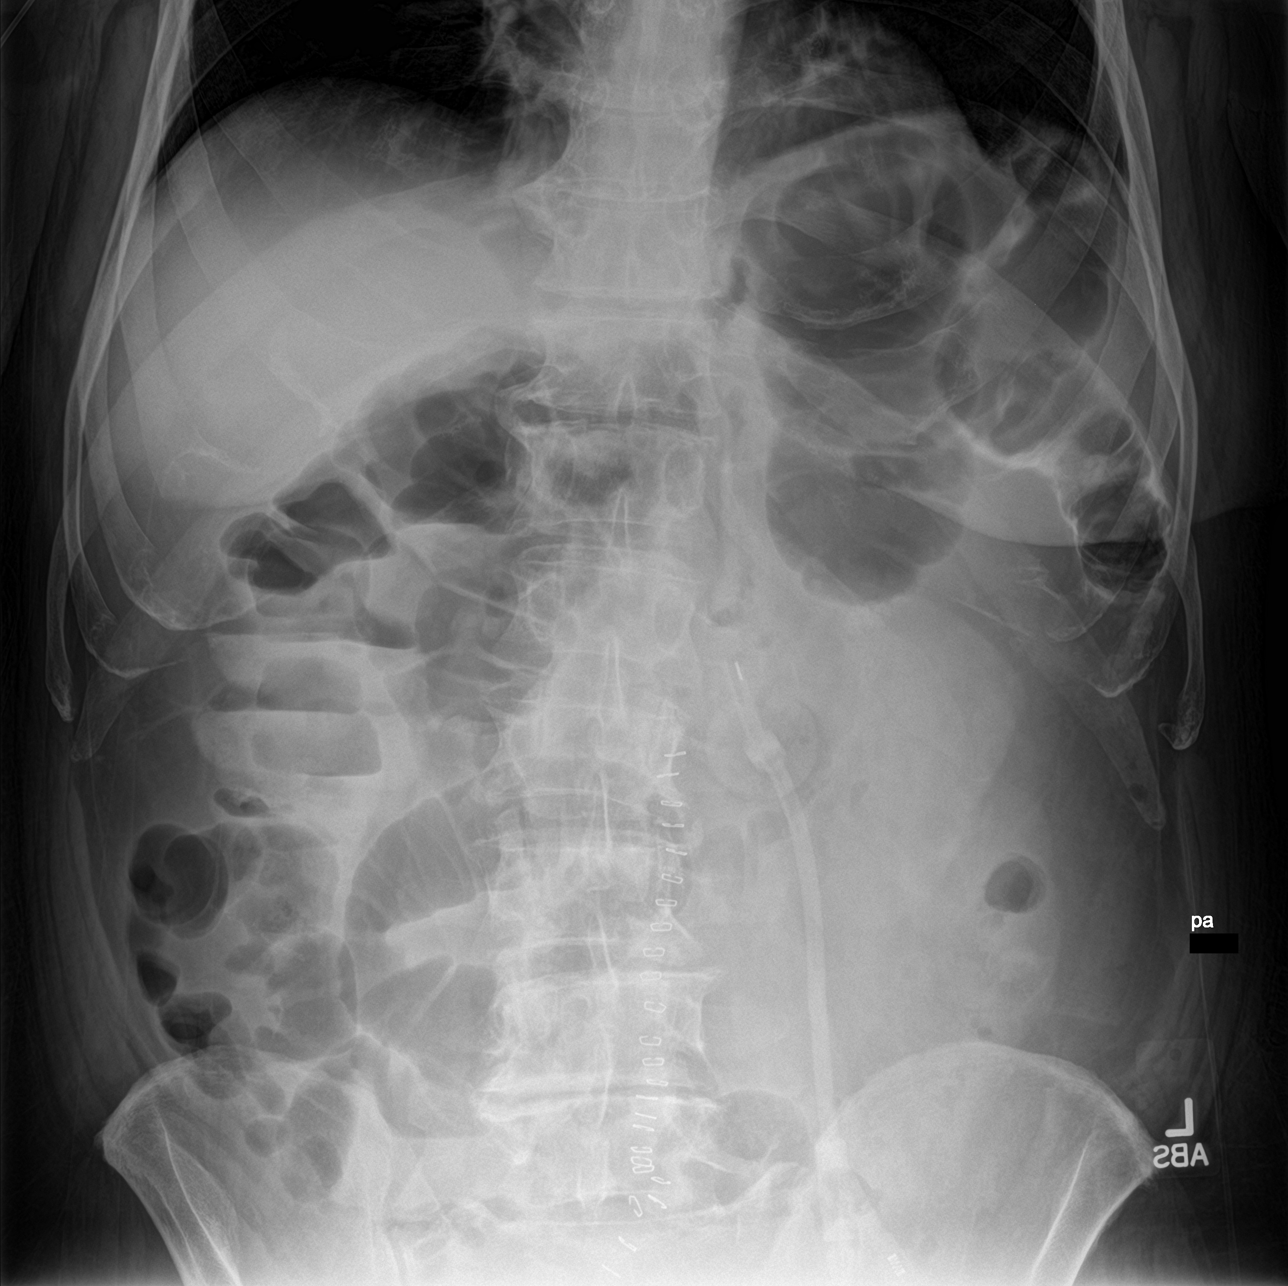

[abdomen erect (2 of 2)]
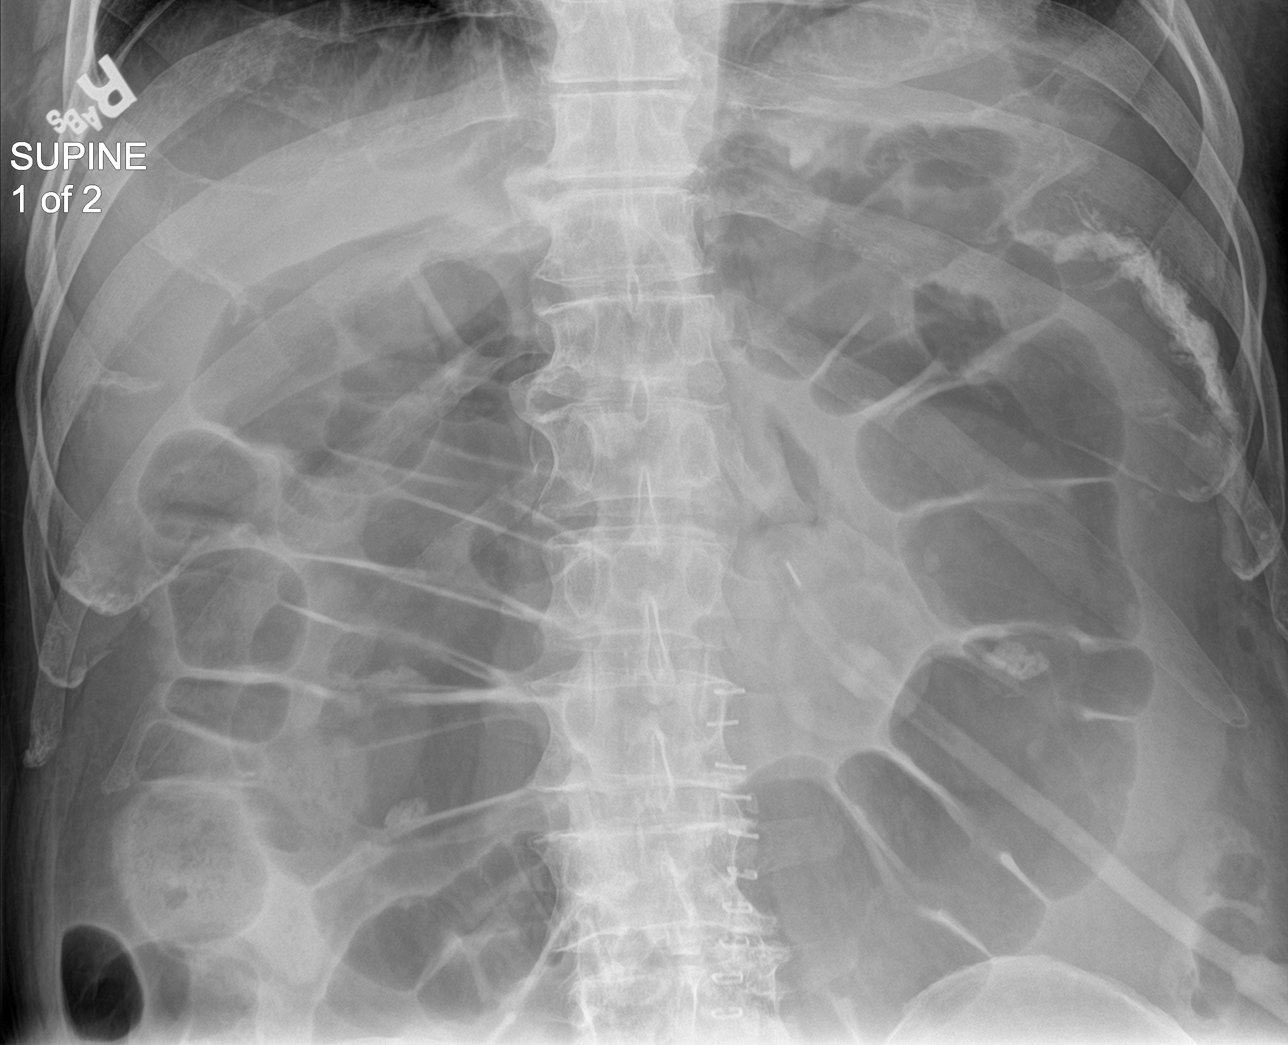

[abdomen supine]
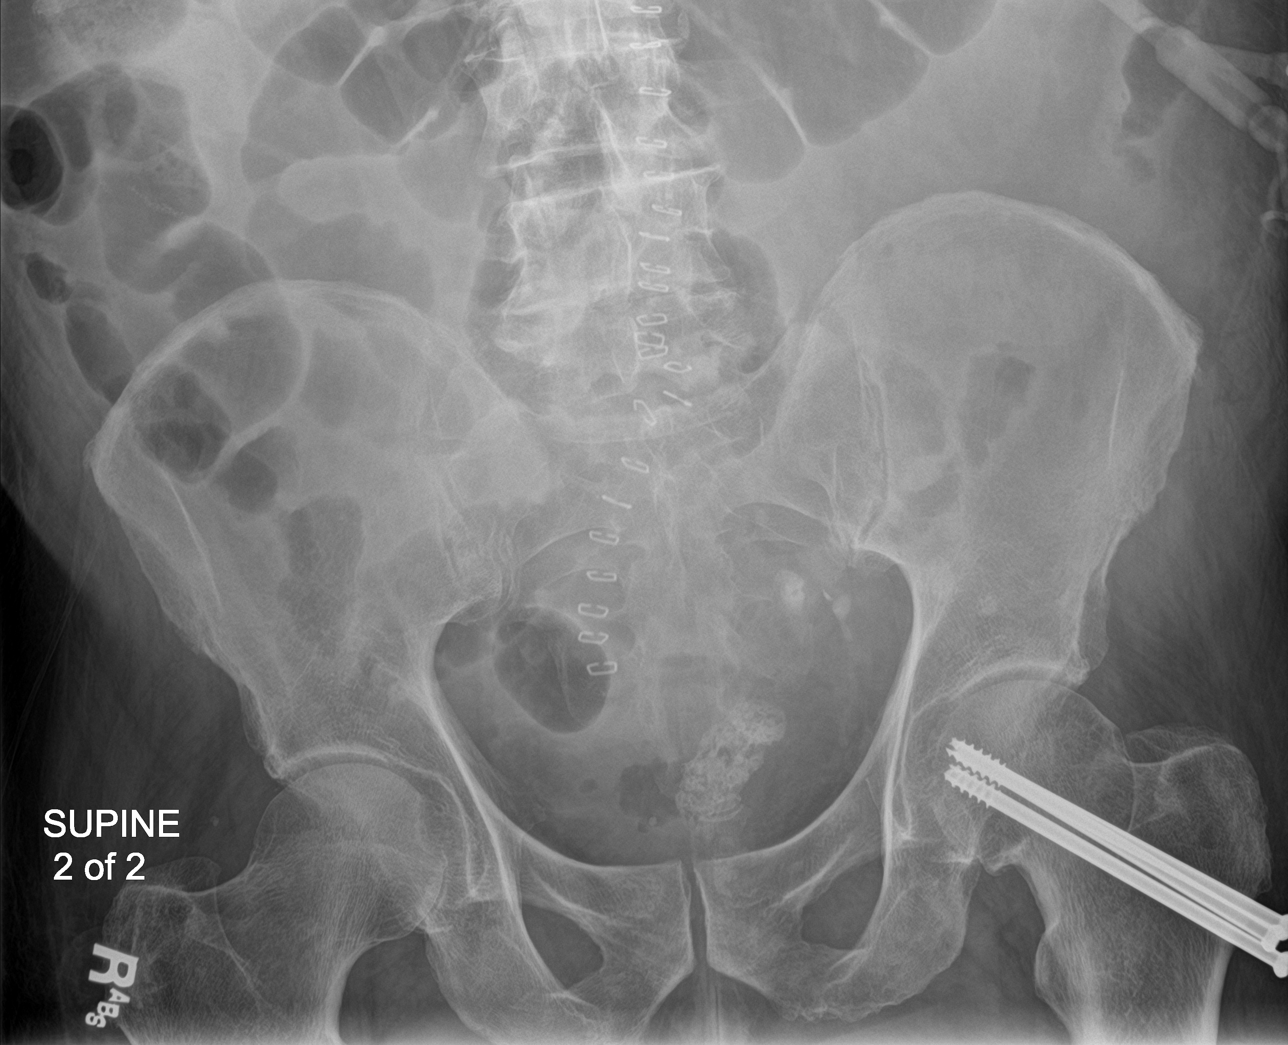

[3 of 3 positions shown; findings below may reference images not displayed]

FINDINGS: There are distended mid abdominal bowel loops, predominantly
colonic. Proximal small bowel obstruction pattern noted previously
appears to be resolving.

There is some prominence of small bowel loops in the RIGHT lower
quadrant, distal small bowel, not particularly dilated, and without
air-fluid level.
IMPRESSION: Resolving/improved small bowel obstruction following laparotomy.
Colonic distention, likely ileus.

## 2018-02-22 ENCOUNTER — Other Ambulatory Visit: Payer: Self-pay | Admitting: Family Medicine

## 2018-02-22 ENCOUNTER — Other Ambulatory Visit: Payer: Self-pay | Admitting: General Surgery

## 2018-02-22 DIAGNOSIS — I10 Essential (primary) hypertension: Secondary | ICD-10-CM

## 2018-02-24 NOTE — Telephone Encounter (Signed)
I called regarding request for flomax RX refill. He states he did not know about his missed appointment in March with Dr Jeananne Rama. He plans on getting an appointment or changing PCP. He states he currently has some Flomax on hand.

## 2018-04-09 ENCOUNTER — Encounter: Payer: Self-pay | Admitting: Urology

## 2018-04-09 ENCOUNTER — Ambulatory Visit (INDEPENDENT_AMBULATORY_CARE_PROVIDER_SITE_OTHER): Payer: Medicare Other | Admitting: Urology

## 2018-04-09 ENCOUNTER — Other Ambulatory Visit: Payer: Self-pay | Admitting: Urology

## 2018-04-09 VITALS — BP 159/73 | HR 59 | Resp 16 | Ht 73.0 in | Wt 230.0 lb

## 2018-04-09 DIAGNOSIS — N401 Enlarged prostate with lower urinary tract symptoms: Secondary | ICD-10-CM

## 2018-04-09 DIAGNOSIS — R339 Retention of urine, unspecified: Secondary | ICD-10-CM | POA: Diagnosis not present

## 2018-04-09 DIAGNOSIS — N138 Other obstructive and reflux uropathy: Secondary | ICD-10-CM

## 2018-04-09 DIAGNOSIS — K430 Incisional hernia with obstruction, without gangrene: Secondary | ICD-10-CM

## 2018-04-09 LAB — BLADDER SCAN AMB NON-IMAGING: SCAN RESULT: 183

## 2018-04-09 NOTE — Progress Notes (Signed)
04/09/2018 4:53 PM   Drew Mcmahon [birth year] [id number]  Referring provider: Guadalupe Maple, MD [address] [place], [place] [address]  Chief Complaint  Patient presents with  . Follow-up    HPI: 72 year old male with a history of BPH and history of postoperative urinary retention who returns today for routine follow-up.  He was started on finasteride/Flomax just over a year ago following bowel surgery complicated by postoperative urinary retention.  He also was treated for urinary tract infection at the time.  Today, he reports persistent and worsening obstructive urinary symptoms.  IPSS as below.  His symptoms are not well controlled on the above medications.  He may be interested in surgical intervention.  No UTIs, dysuria or gross hematuria.  He also is concerned today about bulging near his abdominal incision.  He is followed by Dr. Tollie Pizza.  He is occasional abdominal discomfort associated with this without severe pain, nausea or vomiting.   IPSS    Row Name 04/09/18 1400         International Prostate Symptom Score   How often have you had the sensation of not emptying your bladder?  Not at All     How often have you had to urinate less than every two hours?  Not at All     How often have you found you stopped and started again several times when you urinated?  Not at All     How often have you found it difficult to postpone urination?  More than half the time     How often have you had a weak urinary stream?  About half the time     How often have you had to strain to start urination?  Not at All     How many times did you typically get up at night to urinate?  None     Total IPSS Score  7       Quality of Life due to urinary symptoms   If you were to spend the rest of your life with your urinary condition just the way it is now how would you feel about that?  Unhappy        Score:  1-7 Mild 8-19 Moderate 20-35 Severe   PMH: Past Medical History:    Diagnosis Date  . Elevated lipids   . Hip fracture ([place]) 2007  . Hyperlipidemia   . Hypertension     Surgical History: Past Surgical History:  Procedure Laterality Date  . APPENDECTOMY  04/03/2017   Incidental APPENDECTOMY;  Surgeon: [name], MD;  Location: [facility] OR[facility];  Service: General;;  . BOWEL RESECTION  04/03/2017   Procedure: FOCAL SMALL BOWEL RESECTION;  Surgeon: [name], MD;  Location: [facility];  Service: General;;  . COLONOSCOPY WITH PROPOFOL N/A 06/12/2016   Procedure: COLONOSCOPY WITH PROPOFOL;  Surgeon: Manya Silvas, MD;  Location: [facility] ENDOSCOPY;  Service: Endoscopy;  Laterality: N/A;  . FRACTURE SURGERY Left 2007   hip  . LAPAROSCOPY N/A 04/01/2017   Procedure: LAPAROSCOPY DIAGNOSTIC;  Surgeon: [name], MD;  Location: [facility];  Service: General;  Laterality: N/A;  . LAPAROTOMY N/A 04/03/2017   Procedure: EXPLORATORY LAPAROTOMY;  Surgeon: [name], MD;  Location: [facility];  Service: General;  Laterality: N/A;  . LYSIS OF ADHESION  04/03/2017   Procedure: LYSIS OF ADHESION;  Surgeon: [name], MD;  Location: [facility];  Service: General;;    Home Medications:  Allergies  as of 04/09/2018   No Known Allergies     Medication List        Accurate as of 04/09/18 11:59 PM. Always use your most recent med list.          aspirin EC 81 MG EC tablet Generic drug:  aspirin Take 81 mg by mouth daily. Swallow whole.   benazepril 40 MG tablet Commonly known as:  LOTENSIN Take 1 tablet (40 mg total) by mouth daily.   finasteride 5 MG tablet Commonly known as:  PROSCAR Take 1 tablet (5 mg total) by mouth daily.   hydrochlorothiazide 25 MG tablet Commonly known as:  HYDRODIURIL TAKE 1 TABLET BY MOUTH  DAILY   metoprolol succinate 50 MG 24 hr tablet Commonly known as:  TOPROL-XL TAKE 1 TABLET BY MOUTH ONCE DAILY TAKE  WITH  OR  IMMEDIATELY  FOLLOWING  A  MEAL   ONE-A-DAY MENS VITACRAVES PO Take by mouth.   tamsulosin  0.4 MG Caps capsule Commonly known as:  FLOMAX Take 1 capsule (0.4 mg total) by mouth daily.   triamcinolone cream 0.1 % Commonly known as:  KENALOG       Allergies: No Known Allergies  Family History: Family History  Problem Relation Age of Onset  . Hypertension Mother   . Alzheimer's disease Mother   . Hypertension Father   . Cancer Father        lung  . Hypertension Sister   . Hypertension Brother   . Hypertension Sister   . Hypertension Sister   . Hypertension Brother   . Prostate cancer Neg Hx   . Kidney cancer Neg Hx     Social History:  reports that he quit smoking about 32 years ago. His smoking use included cigarettes. He has never used smokeless tobacco. He reports that he does not drink alcohol or use drugs.  ROS: UROLOGY Frequent Urination?: No Hard to postpone urination?: No Burning/pain with urination?: No Get up at night to urinate?: No Leakage of urine?: No Urine stream starts and stops?: No Trouble starting stream?: No Do you have to strain to urinate?: No Blood in urine?: No Urinary tract infection?: No Sexually transmitted disease?: No Injury to kidneys or bladder?: No Painful intercourse?: No Weak stream?: No Erection problems?: No Penile pain?: No  Gastrointestinal Nausea?: No Vomiting?: No Indigestion/heartburn?: No Diarrhea?: No Constipation?: No  Constitutional Fever: No Night sweats?: No Weight loss?: No Fatigue?: No  Skin Skin rash/lesions?: No Itching?: No  Eyes Blurred vision?: No Double vision?: No  Ears/Nose/Throat Sore throat?: No Sinus problems?: No  Hematologic/Lymphatic Swollen glands?: No Easy bruising?: No  Cardiovascular Leg swelling?: No Chest pain?: No  Respiratory Cough?: No Shortness of breath?: No  Endocrine Excessive thirst?: No  Musculoskeletal Back pain?: No Joint pain?: No  Neurological Headaches?: No Dizziness?: No  Psychologic Depression?: No Anxiety?: No  Physical  Exam: BP (!) 159/73   Pulse (!) 59   Resp 16   Ht 6\' 1"  (1.854 m)   Wt 230 lb (104.3 kg)   SpO2 96%   BMI 30.34 kg/m   Constitutional:  Alert and oriented, No acute distress. HEENT: [place] AT, moist mucus membranes.  Trachea midline, no masses. Cardiovascular: No clubbing, cyanosis, or edema. Respiratory: Normal respiratory effort, no increased work of breathing. GI: Abdomen is soft, incisional hernia appreciated, bowel palpable just beneath the skin, reducible Rectal:  Normal sphincter tone, nonnodular, enlarged, nontender Skin: No rashes, bruises or suspicious lesions. Neurologic: Grossly intact, no focal deficits,  moving all 4 extremities. Psychiatric: Normal mood and affect.  Laboratory Data: Lab Results  Component Value Date   WBC 12.8 (H) 04/13/2017   HGB 12.1 (L) 04/13/2017   HCT 35.0 (L) 04/13/2017   MCV 90.0 04/13/2017   PLT 468 (H) 04/13/2017    Lab Results  Component Value Date   CREATININE 0.80 04/13/2017   No recent PSA, previous PSA 4.11 04/2017   Pertinent Imaging: Results for orders placed or performed in visit on 04/09/18  BLADDER SCAN AMB NON-IMAGING  Result Value Ref Range   Scan Result 183     Assessment & Plan:    1. Benign prostatic hyperplasia with urinary obstruction Continue Flomax and finasteride for the time being We discussed given the poor control of his urinary symptoms, I would more strongly recommend consideration of outlet procedure which would likely improve his obstructive urinary symptoms and may improve his irritative voiding symptoms PSA checked today for PSA screening, previously elevated Plan for cystoscopy/TRUS volume sizing for surgical planning purposes Patient is agreeable this plan - BLADDER SCAN AMB NON-IMAGING - PSA; Future  2. Incomplete bladder emptying Likely secondary to #1 Borderline elevated PVR as above  3. Incisional hernia with obstruction but no gangrene Recommend follow-up with Dr. Bary Castilla to discuss this  further No evidence of obstruction or incarceration   Return for TRUS/ cysto in [place] next availible.  Hollice Espy, MD  [facility] [address] [place],  [address] [phone]

## 2018-04-10 LAB — PSA: PROSTATE SPECIFIC AG, SERUM: 1.6 ng/mL (ref 0.0–4.0)

## 2018-04-12 ENCOUNTER — Telehealth: Payer: Self-pay | Admitting: Urology

## 2018-04-12 NOTE — Telephone Encounter (Signed)
Pt wife states pt was seen by Dr. Erlene Quan on Friday, it was discussed that pt needed to see Dr. Marlyn Corporal. Pt wife called Dr. Marlyn Corporal office who advised pt that they needed a referral. Pt wife asking for the referral to see Dr. Marlyn Corporal please. May call wife gayle @ [phone].

## 2018-04-15 ENCOUNTER — Telehealth: Payer: Self-pay | Admitting: Urology

## 2018-04-15 DIAGNOSIS — K432 Incisional hernia without obstruction or gangrene: Secondary | ICD-10-CM

## 2018-04-15 NOTE — Telephone Encounter (Signed)
Pt's PSA 1.6 on 04/09/2018, ok to advise pt of normal results? Please advise

## 2018-04-15 NOTE — Telephone Encounter (Signed)
Pt wife called office stating that her husband "joe" was seen by Dr. Erlene Quan last Friday, Edd Fabian states that she still has not heard from Dr. Alphonzo Cruise office nor has anyone called with Joe's PSA results.  Please contact Gayle at [phone].

## 2018-04-19 ENCOUNTER — Telehealth: Payer: Self-pay | Admitting: Urology

## 2018-04-19 MED ORDER — TAMSULOSIN HCL 0.4 MG PO CAPS: 0.4000 mg | ORAL_CAPSULE | Freq: Every day | ORAL | 3 refills | Status: DC

## 2018-04-19 MED ORDER — FINASTERIDE 5 MG PO TABS: 5.0000 mg | ORAL_TABLET | Freq: Every day | ORAL | 11 refills | Status: DC

## 2018-04-19 NOTE — Telephone Encounter (Signed)
Patient notified

## 2018-04-19 NOTE — Telephone Encounter (Signed)
Medication sent to pharmacy  

## 2018-04-19 NOTE — Telephone Encounter (Signed)
Fine to share.  Still working on referral.    Hollice Espy, MD

## 2018-04-19 NOTE — Telephone Encounter (Signed)
Patient was prescribed Finasteride and Tamsulosin.  The medication is working well for him and he is requesting a new prescription from Dr. Erlene Quan to be sent to the [place] on [address].  Please advise if there are any issues.

## 2018-04-22 NOTE — Telephone Encounter (Signed)
Patient called the office to inquire about the referral to Dr. Bary Castilla at [facility].  Please enter the referral so the appointment can be made.

## 2018-04-22 NOTE — Addendum Note (Signed)
Addended by: Hollice Espy on: 04/22/2018 10:52 AM   Modules accepted: Orders

## 2018-05-10 ENCOUNTER — Other Ambulatory Visit: Payer: Self-pay | Admitting: Family Medicine

## 2018-05-25 ENCOUNTER — Encounter: Payer: Self-pay | Admitting: Urology

## 2018-05-25 ENCOUNTER — Ambulatory Visit: Payer: Medicare Other | Admitting: Urology

## 2018-05-25 ENCOUNTER — Ambulatory Visit: Payer: Medicare Other | Admitting: General Surgery

## 2018-05-25 ENCOUNTER — Encounter: Payer: Self-pay | Admitting: General Surgery

## 2018-05-25 VITALS — BP 144/80 | HR 72 | Ht 72.0 in | Wt 230.0 lb

## 2018-05-25 VITALS — BP 156/72 | HR 51 | Ht 73.0 in | Wt 230.0 lb

## 2018-05-25 DIAGNOSIS — N401 Enlarged prostate with lower urinary tract symptoms: Secondary | ICD-10-CM | POA: Diagnosis not present

## 2018-05-25 DIAGNOSIS — Z87898 Personal history of other specified conditions: Secondary | ICD-10-CM

## 2018-05-25 DIAGNOSIS — N138 Other obstructive and reflux uropathy: Secondary | ICD-10-CM | POA: Diagnosis not present

## 2018-05-25 DIAGNOSIS — K439 Ventral hernia without obstruction or gangrene: Secondary | ICD-10-CM

## 2018-05-25 MED ORDER — LIDOCAINE HCL URETHRAL/MUCOSAL 2 % EX GEL
1.0000 "application " | Freq: Once | CUTANEOUS | Status: AC
  Administered 2018-05-25: 1 via URETHRAL

## 2018-05-25 MED ORDER — CIPROFLOXACIN HCL 500 MG PO TABS
500.0000 mg | ORAL_TABLET | Freq: Once | ORAL | Status: AC
  Administered 2018-05-25: 500 mg via ORAL

## 2018-05-25 NOTE — H&P (View-Only) (Signed)
05/25/18  Chief Complaint  Patient presents with  . Cysto  . TRUS     HPI: 72 year old male with refractory urinary symptoms related to BPH who presents today to the office for cystoscopy and prostate sizing   Please see previous notes for details.     Vitals:   05/25/18 1601  BP: (!) 156/72  Pulse: (!) 51   NED. A&Ox3.   No respiratory distress   Abd soft, NT, ND Normal phallus with bilateral descended testicles    Cystoscopy Procedure Note  Patient identification was confirmed, informed consent was obtained, and patient was prepped using Betadine solution.  Lidocaine jelly was administered per urethral meatus.    Preoperative abx where received prior to procedure.     Pre-Procedure: - Inspection reveals a normal caliber ureteral meatus.  Procedure: The flexible cystoscope was introduced without difficulty - No urethral strictures/lesions are present. - Enlarged prostate trilobar coaptation, at least 5 cm prostatic length - Normal bladder neck - Bilateral ureteral orifices identified - Bladder mucosa  reveals no ulcers, tumors, or lesions - No bladder stones -Moderate trabeculation with small diverticula, particularly at the dome, narrowed mouth  Retroflexion shows intravesical median lobe, small   Post-Procedure: - Patient tolerated the procedure well  Assessment/ Plan:    Prostate transrectal ultrasound sizing   Informed consent was obtained after discussing risks/benefits of the procedure.  A time out was performed to ensure correct patient identity.   Pre-Procedure: -Transrectal probe was placed without difficulty -Transrectal Ultrasound performed revealing a 57 gm prostate measuring 4.46 x 4.41 x 5.57 cm (length) -No significant hypoechoic or median lobe noted      Assessment/ Plan:  1. Benign prostatic hyperplasia with urinary obstruction Prostamegaly with median lobe with evidence of chronic bladder outlet obstruction including significant  trabeculation and bladder diverticula He continues to be relatively symptomatic despite maximal medical therapy We discussed pursuing bladder outlet procedure Given the presence of his median lobe, he be a good candidate for either holmium laser enucleation of the prostate versus TURP.  Alternatives including UroLift and ablative techniques were also discussed.  And benefits of each were discussed in detail.  After lengthy discussion, we discussed pursuing a holmium laser enucleation especially for treatment of his median lobe and possible enucleation of the lateral lobes versus hybrid technique with her.  He is agreeable this plan.  We discussed the risk of surgery including risks of bleeding, infection, damage to surrounding structures, retrograde ejaculation, stress incontinence, failure to resolve his urinary symptoms, worsening of his irritative voiding symptoms, amongst others.  All questions were answered.  Postoperative course was also discussed.  We will tentatively plan to keep him overnight for observation but if his hematuria is minimal, may discharge home with a Foley catheter.  He understands all this and is agreeable.  He would like to have this procedure done prior to pursuing treatment of his incisional hernia with Dr. Bary Castilla which is reasonable given his history of postoperative urinary retention.  - Urinalysis, Complete - ciprofloxacin (CIPRO) tablet 500 mg - lidocaine (XYLOCAINE) 2 % jelly 1 application - CULTURE, URINE COMPREHENSIVE  2. History of urinary retention As above   Hollice Espy, MD

## 2018-05-25 NOTE — Patient Instructions (Signed)
Laparoscopic Ventral Hernia Repair Laparoscopic ventral hernia repairis a procedure to fix a bulge of tissue that pushes through a weak area of muscle in the abdomen (ventral hernia). A ventral hernia may be at the belly button (umbilical), above the belly button (epigastric), or at the incision site from previous abdominal surgery (incisional hernia). You may have this procedure as emergency surgery if part of your intestine gets trapped inside the hernia and starts to lose its blood supply (strangulation). Laparoscopic surgery is done through small incisions using a thin surgical telescope with a light and camera on the end (laparoscope). During surgery, your surgeon will use images from the laparoscope to guide the procedure. A mesh screen will be placed in the hernia to close the opening and strengthen the abdominal wall. Tell a health care provider about:  Any allergies you have.  All medicines you are taking, including vitamins, herbs, eye drops, creams, and over-the-counter medicines.  Any problems you or family members have had with anesthetic medicines.  Any blood disorders you have.  Any surgeries you have had.  Any medical conditions you have.  Whether you are pregnant or may be pregnant. What are the risks? Generally, this is a safe procedure. However, problems may occur, including:  Infection.  Bleeding.  Allergic reactions to medicines.  Damage to other structures or organs in the abdomen.  Trouble urinating or having a bowel movement after surgery.  Pneumonia.  Blood clots.  The hernia coming back after surgery.  Fluid buildup in the area of the hernia.  In some cases, your health care provider may need to switch from a laparoscopic procedure to a procedure that is done through a single, larger incision in the abdomen (open procedure). You may need an open procedure if:  You have a hernia that is difficult to repair.  Your organs are hard to see.  You have  bleeding problems during the laparoscopic procedure.  What happens before the procedure? Staying hydrated Follow instructions from your health care provider about hydration, which may include:  Up to 2 hours before the procedure - you may continue to drink clear liquids, such as water, clear fruit juice, black coffee, and plain tea.  Eating and drinking restrictions Follow instructions from your health care provider about eating and drinking, which may include:  8 hours before the procedure - stop eating heavy meals or foods such as meat, fried foods, or fatty foods.  6 hours before the procedure - stop eating light meals or foods, such as toast or cereal.  6 hours before the procedure - stop drinking milk or drinks that contain milk.  2 hours before the procedure - stop drinking clear liquids.  Medicines  Ask your health care provider about: ? Changing or stopping your regular medicines. This is especially important if you are taking diabetes medicines or blood thinners. ? Taking medicines such as aspirin and ibuprofen. These medicines can thin your blood. Do not take these medicines before your procedure if your health care provider instructs you not to.  You may be given antibiotic medicine to help prevent infection. General instructions  You may be asked to take a laxative or do an enema to empty your bowel before surgery (bowel prep).  Do not use any products that contain nicotine or tobacco, such as cigarettes and e-cigarettes. If you need help quitting, ask your health care provider.  You may need to have tests before the procedure, such as: ? Blood tests. ? Urine tests. ?  Abdominal ultrasound. ? Chest X-ray. ? Electrocardiogram (ECG).  Plan to have someone take you home from the hospital or clinic.  If you will be going home right after the procedure, plan to have someone with you for 24 hours. What happens during the procedure?  To reduce your risk of  infection: ? Your health care team will wash or sanitize their hands. ? Your skin will be washed with soap.  An IV tube will be inserted into one of your veins.  You will be given one or more of the following: ? A medicine to help you relax (sedative). ? A medicine to make you fall asleep (general anesthetic).  A small incision will be made in your abdomen. A hollow metal tube (trocar) will be placed through the incision.  A tube will be placed through the trocar to inflate your abdomen with air-like gas. This makes it easier for your surgeon to see inside your abdomen and do the repair.  The laparoscope will be inserted into your abdomen through the trocar. The laparoscope will send images to a monitor in the operating room.  Other trocars will be put through other small incisions in your abdomen. The surgical instruments needed for the procedure will be placed through these trocars.  The tissue or intestines that make up the hernia will be moved back into place.  The edges of the hernia may be stitched together.  A piece of mesh will be used to close the hernia. Stitches (sutures), clips, or staples will be used to keep the mesh in place.  A bandage (dressing) or skin glue will be put over the incisions. The procedure may vary among health care providers and hospitals. What happens after the procedure?  Your blood pressure, heart rate, breathing rate, and blood oxygen level will be monitored until the medicines you were given have worn off.  You will continue to receive fluids and medicines through an IV tube. Your IV tube will be removed when you can drink clear fluids.  You will be given pain medicine as needed.  You will be encouraged to get up and walk around as soon as possible.  You may have to wear compression stockings. These stockings help to prevent blood clots and reduce swelling in your legs.  You will be shown how to do deep breathing exercises to help prevent a  lung infection.  Do not drive for 24 hours if you were given a sedative. This information is not intended to replace advice given to you by your health care provider. Make sure you discuss any questions you have with your health care provider. Document Released: 09/29/2012 Document Revised: 05/30/2016 Document Reviewed: 05/30/2016 Elsevier Interactive Patient Education  [employer].

## 2018-05-25 NOTE — Progress Notes (Signed)
05/25/18  Chief Complaint  Patient presents with  . Cysto  . TRUS     HPI: 72 year old male with refractory urinary symptoms related to BPH who presents today to the office for cystoscopy and prostate sizing   Please see previous notes for details.     Vitals:   05/25/18 1601  BP: (!) 156/72  Pulse: (!) 51   NED. A&Ox3.   No respiratory distress   Abd soft, NT, ND Normal phallus with bilateral descended testicles    Cystoscopy Procedure Note  Patient identification was confirmed, informed consent was obtained, and patient was prepped using Betadine solution.  Lidocaine jelly was administered per urethral meatus.    Preoperative abx where received prior to procedure.     Pre-Procedure: - Inspection reveals a normal caliber ureteral meatus.  Procedure: The flexible cystoscope was introduced without difficulty - No urethral strictures/lesions are present. - Enlarged prostate trilobar coaptation, at least 5 cm prostatic length - Normal bladder neck - Bilateral ureteral orifices identified - Bladder mucosa  reveals no ulcers, tumors, or lesions - No bladder stones -Moderate trabeculation with small diverticula, particularly at the dome, narrowed mouth  Retroflexion shows intravesical median lobe, small   Post-Procedure: - Patient tolerated the procedure well  Assessment/ Plan:    Prostate transrectal ultrasound sizing   Informed consent was obtained after discussing risks/benefits of the procedure.  A time out was performed to ensure correct patient identity.   Pre-Procedure: -Transrectal probe was placed without difficulty -Transrectal Ultrasound performed revealing a 57 gm prostate measuring 4.46 x 4.41 x 5.57 cm (length) -No significant hypoechoic or median lobe noted      Assessment/ Plan:  1. Benign prostatic hyperplasia with urinary obstruction Prostamegaly with median lobe with evidence of chronic bladder outlet obstruction including significant  trabeculation and bladder diverticula He continues to be relatively symptomatic despite maximal medical therapy We discussed pursuing bladder outlet procedure Given the presence of his median lobe, he be a good candidate for either holmium laser enucleation of the prostate versus TURP.  Alternatives including UroLift and ablative techniques were also discussed.  And benefits of each were discussed in detail.  After lengthy discussion, we discussed pursuing a holmium laser enucleation especially for treatment of his median lobe and possible enucleation of the lateral lobes versus hybrid technique with her.  He is agreeable this plan.  We discussed the risk of surgery including risks of bleeding, infection, damage to surrounding structures, retrograde ejaculation, stress incontinence, failure to resolve his urinary symptoms, worsening of his irritative voiding symptoms, amongst others.  All questions were answered.  Postoperative course was also discussed.  We will tentatively plan to keep him overnight for observation but if his hematuria is minimal, may discharge home with a Foley catheter.  He understands all this and is agreeable.  He would like to have this procedure done prior to pursuing treatment of his incisional hernia with Dr. Bary Castilla which is reasonable given his history of postoperative urinary retention.  - Urinalysis, Complete - ciprofloxacin (CIPRO) tablet 500 mg - lidocaine (XYLOCAINE) 2 % jelly 1 application - CULTURE, URINE COMPREHENSIVE  2. History of urinary retention As above   Hollice Espy, MD

## 2018-05-25 NOTE — Progress Notes (Signed)
Patient ID: Drew Mcmahon, male   DOB: 1946-08-25, 72 y.o.   MRN: [id number]  Chief Complaint  Patient presents with  . Other    HPI Drew Mcmahon is a 72 y.o. male here today for a evaluation of a incisional hernia. Patient states he noticed this area about 3 months ago. No pain . Moves bowel every other day.   The patient had undergone abdominal exploration in June 2018 for a focal area of small bowel obstruction secondary to adhesive disease of the terminal ileum in the pelvis.  His wound healed without complication and at the time of his last visit on May 19, 2017 he was doing well and was released to full activity.     HPI  Past Medical History:  Diagnosis Date  . Elevated lipids   . Hip fracture ([place]) 2007  . Hyperlipidemia   . Hypertension     Past Surgical History:  Procedure Laterality Date  . APPENDECTOMY  04/03/2017   Incidental APPENDECTOMY;  Surgeon: [name], MD;  Location: [facility];  Service: General;;  . BOWEL RESECTION  04/03/2017   Procedure: FOCAL SMALL BOWEL RESECTION;  Surgeon: [name], MD;  Location: [facility];  Service: General;;  . COLONOSCOPY WITH PROPOFOL N/A 06/12/2016   Procedure: COLONOSCOPY WITH PROPOFOL;  Surgeon: Manya Silvas, MD;  Location: [facility];  Service: Endoscopy;  Laterality: N/A;  . FRACTURE SURGERY Left 2007   hip  . LAPAROSCOPY N/A 04/01/2017   Procedure: LAPAROSCOPY DIAGNOSTIC;  Surgeon: [name], MD;  Location: [facility];  Service: General;  Laterality: N/A;  . LAPAROTOMY N/A 04/03/2017   Procedure: EXPLORATORY LAPAROTOMY;  Surgeon: [name], MD;  Location: [facility];  Service: General;  Laterality: N/A;  . LYSIS OF ADHESION  04/03/2017   Procedure: LYSIS OF ADHESION;  Surgeon: [name], MD;  Location: [facility];  Service: General;;    Family History  Problem Relation Age of Onset  . Hypertension Mother   . Alzheimer's disease Mother   . Hypertension Father   . Cancer Father         lung  . Hypertension Sister   . Hypertension Brother   . Hypertension Sister   . Hypertension Sister   . Hypertension Brother   . Prostate cancer Neg Hx   . Kidney cancer Neg Hx     Social History Social History   Tobacco Use  . Smoking status: Former Smoker    Types: Cigarettes    Last attempt to quit: 08/26/1985    Years since quitting: 32.7  . Smokeless tobacco: Never Used  Substance Use Topics  . Alcohol use: No  . Drug use: No    No Known Allergies  Current Outpatient Medications  Medication Sig Dispense Refill  . aspirin (ASPIRIN EC) 81 MG EC tablet Take 81 mg by mouth daily. Swallow whole.    . benazepril (LOTENSIN) 40 MG tablet Take 1 tablet (40 mg total) by mouth daily. 90 tablet 1  . finasteride (PROSCAR) 5 MG tablet Take 1 tablet (5 mg total) by mouth daily. 30 tablet 11  . hydrochlorothiazide (HYDRODIURIL) 25 MG tablet TAKE 1 TABLET BY MOUTH  DAILY 30 tablet 0  . metoprolol succinate (TOPROL-XL) 50 MG 24 hr tablet TAKE 1 TABLET BY MOUTH ONCE DAILY TAKE  WITH  OR  IMMEDIATELY  FOLLOWING  A  MEAL 90 tablet 0  . Multiple Vitamins-Minerals (ONE-A-DAY MENS VITACRAVES PO) Take by mouth.    Marland Kitchen  tamsulosin (FLOMAX) 0.4 MG CAPS capsule Take 1 capsule (0.4 mg total) by mouth daily. 90 capsule 3  . triamcinolone cream (KENALOG) 0.1 %      No current facility-administered medications for this visit.     Review of Systems Review of Systems  Constitutional: Negative.   Respiratory: Negative.   Cardiovascular: Negative.     Blood pressure (!) 144/80, pulse 72, height 6' (1.829 m), weight 230 lb (104.3 kg).  Physical Exam Physical Exam  Constitutional: He is oriented to person, place, and time. He appears well-developed and well-nourished.  Eyes: Pupils are equal, round, and reactive to light. No scleral icterus.  Neck: Neck supple.  Cardiovascular: Normal rate, regular rhythm and normal heart sounds.  Pulmonary/Chest: Effort normal and breath sounds normal.   Abdominal: A hernia is present. Hernia confirmed positive in the ventral area.    Lymphadenopathy:    He has no cervical adenopathy.  Neurological: He is alert and oriented to person, place, and time.  Skin: Skin is warm and dry.    Data Reviewed No recent imaging studies.  Assessment    Multiple abdominal wall defects.    Plan  Hernia precautions and incarceration were discussed with the patient. If they develop symptoms of an incarcerated hernia, they were encouraged to seek prompt medical attention.  The patient is scheduled to undergo evaluation for rising PSA with urology later today.  Would defer any definitive discussion for repair until after urology evaluation is complete.  The patient will most likely require component separation with placement of prosthetic mesh.  HPI, Physical Exam, Assessment and Plan have been scribed under the direction and in the presence of Hervey Ard, MD.  Gaspar Cola, CMA  I have completed the exam and reviewed the above documentation for accuracy and completeness.  I agree with the above.  Haematologist has been used and any errors in dictation or transcription are unintentional.  Hervey Ard, M.D., F.A.C.S.  Forest Gleason Shauntavia Brackin 05/27/2018, 5:58 AM

## 2018-05-26 LAB — URINALYSIS, COMPLETE
BILIRUBIN UA: NEGATIVE
GLUCOSE, UA: NEGATIVE
Ketones, UA: NEGATIVE
Leukocytes, UA: NEGATIVE
Nitrite, UA: NEGATIVE
PH UA: 5.5 (ref 5.0–7.5)
PROTEIN UA: NEGATIVE
Specific Gravity, UA: >1.03 — ABNORMAL HIGH (ref 1.005–1.030)
UUROB: 0.2 mg/dL (ref 0.2–1.0)

## 2018-05-26 LAB — MICROSCOPIC EXAMINATION: WBC, UA: NONE SEEN /hpf (ref 0–5)

## 2018-05-27 DIAGNOSIS — Z8719 Personal history of other diseases of the digestive system: Secondary | ICD-10-CM | POA: Insufficient documentation

## 2018-05-27 DIAGNOSIS — K439 Ventral hernia without obstruction or gangrene: Secondary | ICD-10-CM | POA: Insufficient documentation

## 2018-05-30 LAB — CULTURE, URINE COMPREHENSIVE

## 2018-05-31 ENCOUNTER — Encounter

## 2018-05-31 ENCOUNTER — Ambulatory Visit (INDEPENDENT_AMBULATORY_CARE_PROVIDER_SITE_OTHER): Payer: Medicare Other | Admitting: Family Medicine

## 2018-05-31 ENCOUNTER — Encounter: Payer: Self-pay | Admitting: Family Medicine

## 2018-05-31 VITALS — BP 128/64 | HR 50 | Temp 98.2°F | Resp 12 | Ht 72.0 in | Wt 232.5 lb

## 2018-05-31 DIAGNOSIS — E669 Obesity, unspecified: Secondary | ICD-10-CM

## 2018-05-31 DIAGNOSIS — E785 Hyperlipidemia, unspecified: Secondary | ICD-10-CM

## 2018-05-31 DIAGNOSIS — Z5181 Encounter for therapeutic drug level monitoring: Secondary | ICD-10-CM

## 2018-05-31 DIAGNOSIS — K5730 Diverticulosis of large intestine without perforation or abscess without bleeding: Secondary | ICD-10-CM | POA: Diagnosis not present

## 2018-05-31 DIAGNOSIS — N138 Other obstructive and reflux uropathy: Secondary | ICD-10-CM

## 2018-05-31 DIAGNOSIS — I10 Essential (primary) hypertension: Secondary | ICD-10-CM

## 2018-05-31 DIAGNOSIS — I700 Atherosclerosis of aorta: Secondary | ICD-10-CM

## 2018-05-31 DIAGNOSIS — N401 Enlarged prostate with lower urinary tract symptoms: Secondary | ICD-10-CM

## 2018-05-31 LAB — CBC WITH DIFFERENTIAL/PLATELET
BASOS PCT: 0.6 %
Basophils Absolute: 43 cells/uL (ref 0–200)
Eosinophils Absolute: 192 cells/uL (ref 15–500)
Eosinophils Relative: 2.7 %
HCT: 43.3 % (ref 38.5–50.0)
Hemoglobin: 15 g/dL (ref 13.2–17.1)
Lymphs Abs: 2300 cells/uL (ref 850–3900)
MCH: 30.7 pg (ref 27.0–33.0)
MCHC: 34.6 g/dL (ref 32.0–36.0)
MCV: 88.5 fL (ref 80.0–100.0)
MONOS PCT: 7.6 %
MPV: 8.5 fL (ref 7.5–12.5)
Neutro Abs: 4026 cells/uL (ref 1500–7800)
Neutrophils Relative %: 56.7 %
Platelets: 167 10*3/uL (ref 140–400)
RBC: 4.89 10*6/uL (ref 4.20–5.80)
RDW: 12.2 % (ref 11.0–15.0)
TOTAL LYMPHOCYTE: 32.4 %
WBC mixed population: 540 cells/uL (ref 200–950)
WBC: 7.1 10*3/uL (ref 3.8–10.8)

## 2018-05-31 LAB — COMPLETE METABOLIC PANEL WITH GFR
AG Ratio: 1.4 (calc) (ref 1.0–2.5)
ALKALINE PHOSPHATASE (APISO): 71 U/L (ref 40–115)
ALT: 13 U/L (ref 9–46)
AST: 16 U/L (ref 10–35)
Albumin: 3.8 g/dL (ref 3.6–5.1)
BUN: 15 mg/dL (ref 7–25)
CO2: 30 mmol/L (ref 20–32)
Calcium: 9.5 mg/dL (ref 8.6–10.3)
Chloride: 103 mmol/L (ref 98–110)
Creat: 0.92 mg/dL (ref 0.70–1.18)
GFR, Est African American: 97 mL/min/{1.73_m2} (ref >=60)
GFR, Est Non African American: 83 mL/min/{1.73_m2} (ref >=60)
GLOBULIN: 2.7 g/dL (ref 1.9–3.7)
Glucose, Bld: 105 mg/dL — ABNORMAL HIGH (ref 65–99)
POTASSIUM: 3.9 mmol/L (ref 3.5–5.3)
SODIUM: 139 mmol/L (ref 135–146)
Total Bilirubin: 0.7 mg/dL (ref 0.2–1.2)
Total Protein: 6.5 g/dL (ref 6.1–8.1)

## 2018-05-31 LAB — LIPID PANEL
CHOLESTEROL: 215 mg/dL — AB (ref <200)
HDL: 58 mg/dL (ref >40)
LDL CHOLESTEROL (CALC): 131 mg/dL — AB
Non-HDL Cholesterol (Calc): 157 mg/dL (calc) — ABNORMAL HIGH (ref <130)
TRIGLYCERIDES: 144 mg/dL (ref <150)
Total CHOL/HDL Ratio: 3.7 (calc) (ref <5.0)

## 2018-05-31 MED ORDER — ATORVASTATIN CALCIUM 20 MG PO TABS: 20.0000 mg | ORAL_TABLET | Freq: Every day | ORAL | 1 refills | Status: DC

## 2018-05-31 MED ORDER — LOSARTAN POTASSIUM-HCTZ 100-25 MG PO TABS: 1.0000 | ORAL_TABLET | Freq: Every day | ORAL | 3 refills | Status: DC

## 2018-05-31 NOTE — Assessment & Plan Note (Signed)
Discussed; consider avoiding seeds, popcorn

## 2018-05-31 NOTE — Assessment & Plan Note (Signed)
Encouraged weight loss 

## 2018-05-31 NOTE — Assessment & Plan Note (Signed)
Check electrolytes, kidneys

## 2018-05-31 NOTE — Assessment & Plan Note (Signed)
Check lipids 

## 2018-05-31 NOTE — Assessment & Plan Note (Signed)
Managed by urologist 

## 2018-05-31 NOTE — Assessment & Plan Note (Addendum)
Try DASH guidelines; stop benazepril and HCTZ; start back on Losartan-HCT as before; check BP at home and let me know if not under 140 at least under 150

## 2018-05-31 NOTE — Progress Notes (Signed)
BP 128/64   Pulse (!) 50   Temp 98.2 F (36.8 C) (Oral)   Resp 12   Ht 6' (1.829 m) Comment: Simultaneous filing. User may not have seen previous data.  Wt 232 lb 8 oz (105.5 kg)   SpO2 96%   BMI 31.53 kg/m    Subjective:    Patient ID: Drew Mcmahon, male    DOB: Jul 17, 1946, 72 y.o.   MRN: [id number]  HPI: Drew Mcmahon is a 72 y.o. male  Chief Complaint  Patient presents with  . Establish Care  . Hypertension    discuss bp meds    HPI Patient is here to establish care He has hypertension, for which he takes benazepril 40 mg daily, HCTZ 25 mg daily, and metoprolol succinate 50mg  daily; he is bradycardic at check in, but says he feels fine, no fatigue; he has had HTN for about 10 years or more; ran in the family, not sure; parents lived in to their early 4s; he was on losartan and there was a recall, so he stopped the losartan and started benazepril and HCTZ  He takes proscar and flomax for prostate issues; he had cystoscopy; bladder needs help; reviwed her notes; Sept 9th; trying to move it up (she's checking on that); getting up now 2-3x a night; not much caffeine  He had a blockage in his small intestines a year ago  High cholesterol; last LDL was 125 (February 2018); he does not take a statin; he does take daily aspirin; Dr. Elta Guadeloupe tried statin once, came down, not needed; taking aspirin daily, easy bruising and bleeding, no gums, nose, urine, rectum; eggs every other day;  Lab Results  Component Value Date   CHOL 203 (H) 12/01/2016   HDL 61 12/01/2016   LDLCALC 125 (H) 12/01/2016   TRIG 118 04/13/2017   CHOLHDL 3.3 12/01/2016   The 10-year ASCVD risk score Mikey Bussing DC Jr., et al., 2013) is: 20.7%   Values used to calculate the score:     Age: 51 years     Sex: Male     Is Non-Hispanic African American: No     Diabetic: No     Tobacco smoker: No     Systolic Blood Pressure: 431 mmHg     Is BP treated: Yes     HDL Cholesterol: 61 mg/dL     Total Cholesterol: 203  mg/dL  He sees urologist for BPH; last PSA normal 1.6 down from 4.1 in Feb 2018  Depression screen [facility] 2/9 05/31/2018 12/01/2016 08/27/2015  Decreased Interest 0 0 0  Down, Depressed, Hopeless 0 0 0  PHQ - 2 Score 0 0 0    Relevant past medical, surgical, family and social history reviewed Past Medical History:  Diagnosis Date  . Elevated lipids   . Hip fracture ([place]) 2007  . Hyperlipidemia   . Hypertension    Past Surgical History:  Procedure Laterality Date  . APPENDECTOMY  04/03/2017   Incidental APPENDECTOMY;  Surgeon: [name], MD;  Location: [facility];  Service: General;;  . BOWEL RESECTION  04/03/2017   Procedure: FOCAL SMALL BOWEL RESECTION;  Surgeon: [name], MD;  Location: [facility];  Service: General;;  . COLONOSCOPY WITH PROPOFOL N/A 06/12/2016   Procedure: COLONOSCOPY WITH PROPOFOL;  Surgeon: Manya Silvas, MD;  Location[facility] END[facility];  Service: Endoscopy;  Laterality: N/A;  . FRACTURE SURGERY Left 2007   hip  . LAPAROSCOPY N/A 04/01/2017   Procedure: LAPAROSCOPY DIAGNOSTIC;  Surgeon: [name], MD;  Location: [place] [facility];  Service: General;  Laterality: N/A;  . LAPAROTOMY N/A 04/03/2017   Procedure: EXPLORATORY LAPAROTOMY;  Surgeon: [name], MD;  Location: [facility];  Service: General;  Laterality: N/A;  . LYSIS OF ADHESION  04/03/2017   Procedure: LYSIS OF ADHESION;  Surgeon: [name], MD;  Location: [facility];  Service: General;;   Family History  Problem Relation Age of Onset  . Hypertension Mother   . Alzheimer's disease Mother   . Hypertension Father   . Cancer Father        lung  . Hypertension Sister   . Hypertension Brother   . Hypertension Sister   . Hypertension Sister   . Hypertension Brother   . Prostate cancer Neg Hx   . Kidney cancer Neg Hx    Social History   Tobacco Use  . Smoking status: Former Smoker    Types: Cigarettes    Last attempt to quit: 08/26/1985    Years since quitting: 32.7  .  Smokeless tobacco: Never Used  Substance Use Topics  . Alcohol use: No  . Drug use: No    Interim medical history since last visit reviewed. Allergies and medications reviewed  Review of Systems Per HPI unless specifically indicated above     Objective:    BP 128/64   Pulse (!) 50   Temp 98.2 F (36.8 C) (Oral)   Resp 12   Ht 6' (1.829 m) Comment: Simultaneous filing. User may not have seen previous data.  Wt 232 lb 8 oz (105.5 kg)   SpO2 96%   BMI 31.53 kg/m   Wt Readings from Last 3 Encounters:  05/31/18 232 lb 8 oz (105.5 kg)  05/25/18 230 lb (104.3 kg)  05/25/18 230 lb (104.3 kg)    Physical Exam  Constitutional: He appears well-developed and well-nourished. No distress.  HENT:  Head: Normocephalic and atraumatic.  Eyes: EOM are normal. No scleral icterus.  Neck: No thyromegaly present.  Cardiovascular: Regular rhythm. Bradycardia present.  Pulmonary/Chest: Effort normal and breath sounds normal.  Abdominal: Soft. Bowel sounds are normal. He exhibits no distension.  Musculoskeletal: He exhibits no edema.  Neurological: Coordination normal.  Skin: Skin is warm and dry. No pallor.  Psychiatric: He has a normal mood and affect. His behavior is normal. Judgment and thought content normal.    Results for orders placed or performed in visit on 05/25/18  CULTURE, URINE COMPREHENSIVE  Result Value Ref Range   Urine Culture, Comprehensive Final report (A)    Organism ID, Bacteria Comment (A)    ANTIMICROBIAL SUSCEPTIBILITY Comment   Microscopic Examination  Result Value Ref Range   WBC, UA None seen 0 - 5 /hpf   RBC, UA 0-2 0 - 2 /hpf   Epithelial Cells (non renal) 0-10 0 - 10 /hpf   Mucus, UA Present (A) Not Estab.   Bacteria, UA Few (A) None seen/Few  Urinalysis, Complete  Result Value Ref Range   Specific Gravity, UA >1.030 (H) 1.005 - 1.030   pH, UA 5.5 5.0 - 7.5   Color, UA Yellow Yellow   Appearance Ur Clear Clear   Leukocytes, UA Negative Negative    Protein, UA Negative Negative/Trace   Glucose, UA Negative Negative   Ketones, UA Negative Negative   RBC, UA Trace (A) Negative   Bilirubin, UA Negative Negative   Urobilinogen, Ur 0.2 0.2 - 1.0 mg/dL   Nitrite, UA Negative Negative   Microscopic  Examination See below:       Assessment & Plan:   Problem List Items Addressed This Visit      Cardiovascular and Mediastinum   Hypertension (Chronic)    Try DASH guidelines; stop benazepril and HCTZ; start back on Losartan-HCT as before; check BP at home and let me know if not under 140 at least under 150      Relevant Medications   losartan-hydrochlorothiazide (HYZAAR) 100-25 MG tablet   atorvastatin (LIPITOR) 20 MG tablet   Aortic calcification (HCC)    Check lipids      Relevant Medications   losartan-hydrochlorothiazide (HYZAAR) 100-25 MG tablet   atorvastatin (LIPITOR) 20 MG tablet   Other Relevant Orders   Lipid panel     Digestive   Diverticulosis of colon    Discussed; consider avoiding seeds, popcorn        Genitourinary   BPH (benign prostatic hyperplasia) (Chronic)    Managed by urologist        Other   Obesity (BMI 30.0-34.9)    Encouraged weight loss      Medication monitoring encounter    Check electrolytes, kidneys      Relevant Orders   COMPLETE METABOLIC PANEL WITH GFR   CBC with Differential/Platelet   Hyperlipidemia (Chronic)    Start cholesterol medicine; advised limiting egg yolks and foods from cows and pigs; recheck 6-8 weeks      Relevant Medications   losartan-hydrochlorothiazide (HYZAAR) 100-25 MG tablet   atorvastatin (LIPITOR) 20 MG tablet   Other Relevant Orders   Lipid panel       Follow up plan: Return in about 6 months (around 12/01/2018) for follow-up visit with Dr. Sanda Klein; 3-4 weeks with [facility] for Medicare.  An after-visit summary was printed and given to the patient at [place].  Please see the patient instructions which may contain other information and recommendations  beyond what is mentioned above in the assessment and plan.  Meds ordered this encounter  Medications  . losartan-hydrochlorothiazide (HYZAAR) 100-25 MG tablet    Sig: Take 1 tablet by mouth daily.    Dispense:  90 tablet    Refill:  3  . atorvastatin (LIPITOR) 20 MG tablet    Sig: Take 1 tablet (20 mg total) by mouth at bedtime.    Dispense:  90 tablet    Refill:  1    Orders Placed This Encounter  Procedures  . COMPLETE METABOLIC PANEL WITH GFR  . CBC with Differential/Platelet  . Lipid panel

## 2018-05-31 NOTE — Patient Instructions (Addendum)
Try to follow the DASH guidelines (DASH stands for Dietary Approaches to Stop Hypertension). Try to limit the sodium in your diet to no more than 1,500mg  of sodium per day. Certainly try to not exceed 2,000 mg per day at the very most. Do not add salt when cooking or at the table.  Check the sodium amount on labels when shopping, and choose items lower in sodium when given a choice. Avoid or limit foods that already contain a lot of sodium. Eat a diet rich in fruits and vegetables and whole grains, and try to lose weight if overweight or obese   Check out the information at familydoctor.org entitled "Nutrition for Weight Loss: What You Need to Know about Fad Diets" Try to lose between 1-2 pounds per week by taking in fewer calories and burning off more calories You can succeed by limiting portions, limiting foods dense in calories and fat, becoming more active, and drinking 8 glasses of water a day (64 ounces) Don't skip meals, especially breakfast, as skipping meals may alter your metabolism Do not use over-the-counter weight loss pills or gimmicks that claim rapid weight loss A healthy BMI (or body mass index) is between 18.5 and 24.9 You can calculate your ideal BMI at the [place] website ClubMonetize.fr    DASH Eating Plan DASH stands for "Dietary Approaches to Stop Hypertension." The DASH eating plan is a healthy eating plan that has been shown to reduce high blood pressure (hypertension). It may also reduce your risk for type 2 diabetes, heart disease, and stroke. The DASH eating plan may also help with weight loss. What are tips for following this plan? General guidelines  Avoid eating more than 2,300 mg (milligrams) of salt (sodium) a day. If you have hypertension, you may need to reduce your sodium intake to 1,500 mg a day.  Limit alcohol intake to no more than 1 drink a day for nonpregnant women and 2 drinks a day for men. One drink equals  12 oz of beer, 5 oz of wine, or 1 oz of hard liquor.  Work with your health care provider to maintain a healthy body weight or to lose weight. Ask what an ideal weight is for you.  Get at least 30 minutes of exercise that causes your heart to beat faster (aerobic exercise) most days of the week. Activities may include walking, swimming, or biking.  Work with your health care provider or diet and nutrition specialist (dietitian) to adjust your eating plan to your individual calorie needs. Reading food labels  Check food labels for the amount of sodium per serving. Choose foods with less than 5 percent of the Daily Value of sodium. Generally, foods with less than 300 mg of sodium per serving fit into this eating plan.  To find whole grains, look for the word "whole" as the first word in the ingredient list. Shopping  Buy products labeled as "low-sodium" or "no salt added."  Buy fresh foods. Avoid canned foods and premade or frozen meals. Cooking  Avoid adding salt when cooking. Use salt-free seasonings or herbs instead of table salt or sea salt. Check with your health care provider or pharmacist before using salt substitutes.  Do not fry foods. Cook foods using healthy methods such as baking, boiling, grilling, and broiling instead.  Cook with heart-healthy oils, such as olive, canola, soybean, or sunflower oil. Meal planning   Eat a balanced diet that includes: ? 5 or more servings of fruits and vegetables each day. At each  meal, try to fill half of your plate with fruits and vegetables. ? Up to 6-8 servings of whole grains each day. ? Less than 6 oz of lean meat, poultry, or fish each day. A 3-oz serving of meat is about the same size as a deck of cards. One egg equals 1 oz. ? 2 servings of low-fat dairy each day. ? A serving of nuts, seeds, or beans 5 times each week. ? Heart-healthy fats. Healthy fats called Omega-3 fatty acids are found in foods such as flaxseeds and coldwater  fish, like sardines, salmon, and mackerel.  Limit how much you eat of the following: ? Canned or prepackaged foods. ? Food that is high in trans fat, such as fried foods. ? Food that is high in saturated fat, such as fatty meat. ? Sweets, desserts, sugary drinks, and other foods with added sugar. ? Full-fat dairy products.  Do not salt foods before eating.  Try to eat at least 2 vegetarian meals each week.  Eat more home-cooked food and less restaurant, buffet, and fast food.  When eating at a restaurant, ask that your food be prepared with less salt or no salt, if possible. What foods are recommended? The items listed may not be a complete list. Talk with your dietitian about what dietary choices are best for you. Grains Whole-grain or whole-wheat bread. Whole-grain or whole-wheat pasta. Brown rice. [place] Morrow. Bulgur. Whole-grain and low-sodium cereals. Pita bread. Low-fat, low-sodium crackers. Whole-wheat flour tortillas. Vegetables Fresh or frozen vegetables (raw, steamed, roasted, or grilled). Low-sodium or reduced-sodium tomato and vegetable juice. Low-sodium or reduced-sodium tomato sauce and tomato paste. Low-sodium or reduced-sodium canned vegetables. Fruits All fresh, dried, or frozen fruit. Canned fruit in natural juice (without added sugar). Meat and other protein foods Skinless chicken or [place]. Ground chicken or [place]. Pork with fat trimmed off. Fish and seafood. Egg whites. Dried beans, peas, or lentils. Unsalted nuts, nut butters, and seeds. Unsalted canned beans. Lean cuts of beef with fat trimmed off. Low-sodium, lean deli meat. Dairy Low-fat (1%) or fat-free (skim) milk. Fat-free, low-fat, or reduced-fat cheeses. Nonfat, low-sodium ricotta or cottage cheese. Low-fat or nonfat yogurt. Low-fat, low-sodium cheese. Fats and oils Soft margarine without trans fats. Vegetable oil. Low-fat, reduced-fat, or light mayonnaise and salad dressings (reduced-sodium). Canola,  safflower, olive, soybean, and sunflower oils. Avocado. Seasoning and other foods Herbs. Spices. Seasoning mixes without salt. Unsalted popcorn and pretzels. Fat-free sweets. What foods are not recommended? The items listed may not be a complete list. Talk with your dietitian about what dietary choices are best for you. Grains Baked goods made with fat, such as croissants, muffins, or some breads. Dry pasta or rice meal packs. Vegetables Creamed or fried vegetables. Vegetables in a cheese sauce. Regular canned vegetables (not low-sodium or reduced-sodium). Regular canned tomato sauce and paste (not low-sodium or reduced-sodium). Regular tomato and vegetable juice (not low-sodium or reduced-sodium). Angie Fava. Olives. Fruits Canned fruit in a light or heavy syrup. Fried fruit. Fruit in cream or butter sauce. Meat and other protein foods Fatty cuts of meat. Ribs. Fried meat. Berniece Salines. Sausage. Bologna and other processed lunch meats. Salami. Fatback. Hotdogs. Bratwurst. Salted nuts and seeds. Canned beans with added salt. Canned or smoked fish. Whole eggs or egg yolks. Chicken or [place] with skin. Dairy Whole or 2% milk, cream, and half-and-half. Whole or full-fat cream cheese. Whole-fat or sweetened yogurt. Full-fat cheese. Nondairy creamers. Whipped toppings. Processed cheese and cheese spreads. Fats and oils Butter. Stick margarine. Lard. Shortening.  Wyvonnia Lora fat. Tropical oils, such as coconut, palm kernel, or palm oil. Seasoning and other foods Salted popcorn and pretzels. Onion salt, garlic salt, seasoned salt, table salt, and sea salt. Worcestershire sauce. Tartar sauce. Barbecue sauce. Teriyaki sauce. Soy sauce, including reduced-sodium. Steak sauce. Canned and packaged gravies. Fish sauce. Oyster sauce. Cocktail sauce. Horseradish that you find on the shelf. Ketchup. Mustard. Meat flavorings and tenderizers. Bouillon cubes. Hot sauce and Tabasco sauce. Premade or packaged marinades. Premade or  packaged taco seasonings. Relishes. Regular salad dressings. Where to find more information:  National Heart, Lung, and [place]: https://wilson-eaton.com/  American Heart Association: www.heart.org Summary  The DASH eating plan is a healthy eating plan that has been shown to reduce high blood pressure (hypertension). It may also reduce your risk for type 2 diabetes, heart disease, and stroke.  With the DASH eating plan, you should limit salt (sodium) intake to 2,300 mg a day. If you have hypertension, you may need to reduce your sodium intake to 1,500 mg a day.  When on the DASH eating plan, aim to eat more fresh fruits and vegetables, whole grains, lean proteins, low-fat dairy, and heart-healthy fats.  Work with your health care provider or diet and nutrition specialist (dietitian) to adjust your eating plan to your individual calorie needs. This information is not intended to replace advice given to you by your health care provider. Make sure you discuss any questions you have with your health care provider. Document Released: 10/02/2011 Document Revised: 10/06/2016 Document Reviewed: 10/06/2016 Elsevier Interactive Patient Education  2018 [employer].  Obesity, Adult Obesity is the condition of having too much total body fat. Being overweight or obese means that your weight is greater than what is considered healthy for your body size. Obesity is determined by a measurement called BMI. BMI is an estimate of body fat and is calculated from height and weight. For adults, a BMI of 30 or higher is considered obese. Obesity can eventually lead to other health concerns and major illnesses, including:  Stroke.  Coronary artery disease (CAD).  Type 2 diabetes.  Some types of cancer, including cancers of the colon, breast, uterus, and gallbladder.  Osteoarthritis.  High blood pressure (hypertension).  High cholesterol.  Sleep apnea.  Gallbladder stones.  Infertility  problems.  What are the causes? The main cause of obesity is taking in (consuming) more calories than your body uses for energy. Other factors that contribute to this condition may include:  Being born with genes that make you more likely to become obese.  Having a medical condition that causes obesity. These conditions include: ? Hypothyroidism. ? Polycystic ovarian syndrome (PCOS). ? Binge-eating disorder. ? Cushing syndrome.  Taking certain medicines, such as steroids, antidepressants, and seizure medicines.  Not being physically active (sedentary lifestyle).  Living where there are limited places to exercise safely or buy healthy foods.  Not getting enough sleep.  What increases the risk? The following factors may increase your risk of this condition:  Having a family history of obesity.  Being a woman of African-American descent.  Being a man of Hispanic descent.  What are the signs or symptoms? Having excessive body fat is the main symptom of this condition. How is this diagnosed? This condition may be diagnosed based on:  Your symptoms.  Your medical history.  A physical exam. Your health care provider may measure: ? Your BMI. If you are an adult with a BMI between 25 and less than 30, you are considered  overweight. If you are an adult with a BMI of 30 or higher, you are considered obese. ? The distances around your hips and your waist (circumferences). These may be compared to each other to help diagnose your condition. ? Your skinfold thickness. Your health care provider may gently pinch a fold of your skin and measure it.  How is this treated? Treatment for this condition often includes changing your lifestyle. Treatment may include some or all of the following:  Dietary changes. Work with your health care provider and a dietitian to set a weight-loss goal that is healthy and reasonable for you. Dietary changes may include eating: ? Smaller portions. A  portion size is the amount of a particular food that is healthy for you to eat at one time. This varies from person to person. ? Low-calorie or low-fat options. ? More whole grains, fruits, and vegetables.  Regular physical activity. This may include aerobic activity (cardio) and strength training.  Medicine to help you lose weight. Your health care provider may prescribe medicine if you are unable to lose 1 pound a week after 6 weeks of eating more healthily and doing more physical activity.  Surgery. Surgical options may include gastric banding and gastric bypass. Surgery may be done if: ? Other treatments have not helped to improve your condition. ? You have a BMI of 40 or higher. ? You have life-threatening health problems related to obesity.  Follow these instructions at home:  Eating and drinking   Follow recommendations from your health care provider about what you eat and drink. Your health care provider may advise you to: ? Limit fast foods, sweets, and processed snack foods. ? Choose low-fat options, such as low-fat milk instead of whole milk. ? Eat 5 or more servings of fruits or vegetables every day. ? Eat at home more often. This gives you more control over what you eat. ? Choose healthy foods when you eat out. ? Learn what a healthy portion size is. ? Keep low-fat snacks on hand. ? Avoid sugary drinks, such as soda, fruit juice, iced tea sweetened with sugar, and flavored milk. ? Eat a healthy breakfast.  Drink enough water to keep your urine clear or pale yellow.  Do not go without eating for long periods of time (do not fast) or follow a fad diet. Fasting and fad diets can be unhealthy and even dangerous. Physical Activity  Exercise regularly, as told by your health care provider. Ask your health care provider what types of exercise are safe for you and how often you should exercise.  Warm up and stretch before being active.  Cool down and stretch after being  active.  Rest between periods of activity. Lifestyle  Limit the time that you spend in front of your TV, computer, or video game system.  Find ways to reward yourself that do not involve food.  Limit alcohol intake to no more than 1 drink a day for nonpregnant women and 2 drinks a day for men. One drink equals 12 oz of beer, 5 oz of wine, or 1 oz of hard liquor. General instructions  Keep a weight loss journal to keep track of the food you eat and how much you exercise you get.  Take over-the-counter and prescription medicines only as told by your health care provider.  Take vitamins and supplements only as told by your health care provider.  Consider joining a support group. Your health care provider may be able to recommend a support  group.  Keep all follow-up visits as told by your health care provider. This is important. Contact a health care provider if:  You are unable to meet your weight loss goal after 6 weeks of dietary and lifestyle changes. This information is not intended to replace advice given to you by your health care provider. Make sure you discuss any questions you have with your health care provider. Document Released: 11/20/2004 Document Revised: 03/17/2016 Document Reviewed: 08/01/2015 Elsevier Interactive Patient Education  2018 [employer].

## 2018-05-31 NOTE — Assessment & Plan Note (Signed)
Start cholesterol medicine; advised limiting egg yolks and foods from cows and pigs; recheck 6-8 weeks

## 2018-06-01 ENCOUNTER — Encounter: Payer: Self-pay | Admitting: Family Medicine

## 2018-06-01 DIAGNOSIS — R739 Hyperglycemia, unspecified: Secondary | ICD-10-CM | POA: Insufficient documentation

## 2018-06-03 ENCOUNTER — Other Ambulatory Visit: Payer: Self-pay | Admitting: Radiology

## 2018-06-03 ENCOUNTER — Telehealth: Payer: Self-pay | Admitting: Family Medicine

## 2018-06-03 ENCOUNTER — Telehealth: Payer: Self-pay | Admitting: Radiology

## 2018-06-03 DIAGNOSIS — N401 Enlarged prostate with lower urinary tract symptoms: Principal | ICD-10-CM

## 2018-06-03 DIAGNOSIS — I10 Essential (primary) hypertension: Secondary | ICD-10-CM

## 2018-06-03 DIAGNOSIS — N138 Other obstructive and reflux uropathy: Secondary | ICD-10-CM

## 2018-06-03 MED ORDER — METOPROLOL SUCCINATE ER 50 MG PO TB24
ORAL_TABLET | ORAL | 1 refills | Status: DC
Start: 2018-06-03 — End: 2018-12-01

## 2018-06-03 NOTE — Telephone Encounter (Signed)
UA/Ucx added to pre-admit testing labs to be done at appointment on 06/24/2018.

## 2018-06-03 NOTE — Telephone Encounter (Signed)
Copied from [place] [id number]. Topic: Quick Communication - See Telephone Encounter >> Jun 03, 2018  9:35 AM Ahmed Prima L wrote: CRM for notification. See Telephone encounter for: 06/03/18.  Patient's wife wants to know which medication that Dr Sanda Klein put him on yesterday was for his blood pressure with the booster? Please advise.

## 2018-06-03 NOTE — Telephone Encounter (Signed)
-----   Message from Hollice Espy, MD sent at 06/01/2018  8:06 AM EDT ----- Please have him repeat his UA/ UCx 10 days before surgery.    Hollice Espy, MD

## 2018-06-03 NOTE — Telephone Encounter (Signed)
He states that he needs to have his Metoprolol refilled as well.

## 2018-06-03 NOTE — Telephone Encounter (Signed)
Metoprolol refill sent

## 2018-06-03 NOTE — Telephone Encounter (Signed)
All of that information should be in the note; please help her

## 2018-06-04 ENCOUNTER — Other Ambulatory Visit: Payer: Self-pay

## 2018-06-04 ENCOUNTER — Encounter
Admission: RE | Admit: 2018-06-04 | Discharge: 2018-06-04 | Disposition: A | Discharge status: Home / Self Care | Payer: Medicare Other | Source: Ambulatory Visit | Attending: Urology | Admitting: Urology

## 2018-06-04 DIAGNOSIS — Z01812 Encounter for preprocedural laboratory examination: Secondary | ICD-10-CM | POA: Insufficient documentation

## 2018-06-04 DIAGNOSIS — R001 Bradycardia, unspecified: Secondary | ICD-10-CM | POA: Insufficient documentation

## 2018-06-04 DIAGNOSIS — Z01818 Encounter for other preprocedural examination: Secondary | ICD-10-CM | POA: Insufficient documentation

## 2018-06-04 DIAGNOSIS — I10 Essential (primary) hypertension: Secondary | ICD-10-CM | POA: Insufficient documentation

## 2018-06-04 LAB — URINALYSIS, ROUTINE W REFLEX MICROSCOPIC
Bilirubin Urine: NEGATIVE
GLUCOSE, UA: NEGATIVE mg/dL
Hgb urine dipstick: NEGATIVE
Ketones, ur: NEGATIVE mg/dL
LEUKOCYTES UA: NEGATIVE
NITRITE: NEGATIVE
PH: 6 (ref 5.0–8.0)
PROTEIN: NEGATIVE mg/dL
Specific Gravity, Urine: 1.018 (ref 1.005–1.030)

## 2018-06-04 LAB — BASIC METABOLIC PANEL
ANION GAP: 9 (ref 5–15)
BUN: 21 mg/dL (ref 8–23)
CHLORIDE: 101 mmol/L (ref 98–111)
CO2: 29 mmol/L (ref 22–32)
Calcium: 9.2 mg/dL (ref 8.9–10.3)
Creatinine, Ser: 0.91 mg/dL (ref 0.61–1.24)
GFR calc Af Amer: >60 mL/min (ref >60)
Glucose, Bld: 99 mg/dL (ref 70–99)
Potassium: 3.6 mmol/L (ref 3.5–5.1)
SODIUM: 139 mmol/L (ref 135–145)

## 2018-06-04 LAB — CBC
HCT: 43 % (ref 40.0–52.0)
Hemoglobin: 15 g/dL (ref 13.0–18.0)
MCH: 32.1 pg (ref 26.0–34.0)
MCHC: 34.9 g/dL (ref 32.0–36.0)
MCV: 92.1 fL (ref 80.0–100.0)
Platelets: 189 10*3/uL (ref 150–440)
RBC: 4.67 MIL/uL (ref 4.40–5.90)
RDW: 13.3 % (ref 11.5–14.5)
WBC: 7.2 10*3/uL (ref 3.8–10.6)

## 2018-06-04 NOTE — Patient Instructions (Signed)
Your procedure is scheduled on: Monday 06/14/18 Report to [facility]. To find out your arrival time please call [phone] between 1PM - 3PM on Friday 816/19.  Remember: Instructions that are not followed completely may result in serious medical risk, up to and including death, or upon the discretion of your surgeon and anesthesiologist your surgery may need to be rescheduled.     _X__ 1. Do not eat food after midnight the night before your procedure.                 No gum chewing or hard candies. You may drink clear liquids up to 2 hours                 before you are scheduled to arrive for your surgery- DO not drink clear                 liquids within 2 hours of the start of your surgery.                 Clear Liquids include:  water, apple juice without pulp, clear carbohydrate                 drink such as Clearfast or Gatorade, Black Coffee or Tea (Do not add                 anything to coffee or tea).  __X__2.  On the morning of surgery brush your teeth with toothpaste and water, you                 may rinse your mouth with mouthwash if you wish.  Do not swallow any              toothpaste of mouthwash.     _X__ 3.  No Alcohol for 24 hours before or after surgery.   _X__ 4.  Do Not Smoke or use e-cigarettes For 24 Hours Prior to Your Surgery.                 Do not use any chewable tobacco products for at least 6 hours prior to                 surgery.  ____  5.  Bring all medications with you on the day of surgery if instructed.   __X__  6.  Notify your doctor if there is any change in your medical condition      (cold, fever, infections).     Do not wear jewelry, make-up, hairpins, clips or nail polish. Do not wear lotions, powders, or perfumes.  Do not shave 48 hours prior to surgery. Men may shave face and neck. Do not bring valuables to the hospital.    [facility] is not responsible for any belongings or  valuables.  Contacts, dentures/partials or body piercings may not be worn into surgery. Bring a case for your contacts, glasses or hearing aids, a denture cup will be supplied. Leave your suitcase in the car. After surgery it may be brought to your room. For patients admitted to the hospital, discharge time is determined by your treatment team.   Patients discharged the day of surgery will not be allowed to drive home.   Please read over the following fact sheets that you were given:   MRSA Information  __X__ Take these medicines the morning of surgery with A SIP OF WATER:  1.finasteride (PROSCAR)   2. metoprolol succinate (TOPROL-XL) 50 MG 24 hr tablet  3. tamsulosin (FLOMAX)  4.  5.  6.  ____ Fleet Enema (as directed)   __ __ Use CHG Soap/SAGE wipes as directed  ____ Use inhalers on the day of surgery  ____ Stop metformin/Janumet/Farxiga 2 days prior to surgery    ____ Take 1/2 of usual insulin dose the night before surgery. No insulin the morning          of surgery.   ____ Stop Blood Thinners Coumadin/Plavix/Xarelto/Pleta/Pradaxa/Eliquis/Effient/Aspirin  on   Or contact your Surgeon, Cardiologist or Medical Doctor regarding  ability to stop your blood thinners  __X__ Stop Anti-inflammatories 7 days before surgery such as Advil, Ibuprofen, Motrin,  BC or Goodies Powder, Naprosyn, Naproxen, Aleve,  ASPIRIN (stop Monday 06/07/18) If you need something for aches or pain you my take Tylenol   __X__ Stop all herbal supplements, fish oil or vitamin E until after surgery. Your multivitamin is OK to continue   ____ Bring C-Pap to the hospital.

## 2018-06-05 LAB — URINE CULTURE: Culture: NO GROWTH

## 2018-06-13 MED ORDER — CEFAZOLIN SODIUM-DEXTROSE 2-4 GM/100ML-% IV SOLN
2.0000 g | INTRAVENOUS | Status: AC
  Administered 2018-06-14: 2 g via INTRAVENOUS

## 2018-06-14 ENCOUNTER — Ambulatory Visit
Admission: RE | Admit: 2018-06-14 | Discharge: 2018-06-14 | Disposition: A | Discharge status: Home / Self Care | Payer: Medicare Other | Source: Ambulatory Visit | Attending: Urology | Admitting: Urology

## 2018-06-14 ENCOUNTER — Telehealth: Payer: Self-pay | Admitting: Urology

## 2018-06-14 ENCOUNTER — Ambulatory Visit: Payer: Medicare Other | Admitting: Anesthesiology

## 2018-06-14 ENCOUNTER — Encounter: Payer: Self-pay | Admitting: *Deleted

## 2018-06-14 ENCOUNTER — Other Ambulatory Visit: Payer: Self-pay

## 2018-06-14 ENCOUNTER — Encounter
Admission: RE | Disposition: A | Discharge status: Home / Self Care | Payer: Self-pay | Source: Ambulatory Visit | Attending: Urology

## 2018-06-14 DIAGNOSIS — R339 Retention of urine, unspecified: Secondary | ICD-10-CM | POA: Insufficient documentation

## 2018-06-14 DIAGNOSIS — Z87891 Personal history of nicotine dependence: Secondary | ICD-10-CM | POA: Insufficient documentation

## 2018-06-14 DIAGNOSIS — G709 Myoneural disorder, unspecified: Secondary | ICD-10-CM | POA: Diagnosis not present

## 2018-06-14 DIAGNOSIS — I739 Peripheral vascular disease, unspecified: Secondary | ICD-10-CM | POA: Insufficient documentation

## 2018-06-14 DIAGNOSIS — N320 Bladder-neck obstruction: Secondary | ICD-10-CM | POA: Diagnosis not present

## 2018-06-14 DIAGNOSIS — N138 Other obstructive and reflux uropathy: Secondary | ICD-10-CM | POA: Diagnosis not present

## 2018-06-14 DIAGNOSIS — N401 Enlarged prostate with lower urinary tract symptoms: Secondary | ICD-10-CM

## 2018-06-14 DIAGNOSIS — R338 Other retention of urine: Secondary | ICD-10-CM | POA: Diagnosis not present

## 2018-06-14 DIAGNOSIS — E785 Hyperlipidemia, unspecified: Secondary | ICD-10-CM | POA: Diagnosis not present

## 2018-06-14 DIAGNOSIS — I10 Essential (primary) hypertension: Secondary | ICD-10-CM | POA: Insufficient documentation

## 2018-06-14 DIAGNOSIS — N323 Diverticulum of bladder: Secondary | ICD-10-CM | POA: Diagnosis not present

## 2018-06-14 HISTORY — PX: HOLEP-LASER ENUCLEATION OF THE PROSTATE WITH MORCELLATION: SHX6641

## 2018-06-14 SURGERY — ENUCLEATION, PROSTATE, USING LASER, WITH MORCELLATION
Anesthesia: General | Site: Prostate | Wound class: Clean Contaminated

## 2018-06-14 MED ORDER — EPHEDRINE SULFATE 50 MG/ML IJ SOLN
INTRAMUSCULAR | Status: DC | PRN
  Administered 2018-06-14: 15 mg via INTRAVENOUS
  Administered 2018-06-14: 10 mg via INTRAVENOUS
  Administered 2018-06-14: 15 mg via INTRAVENOUS

## 2018-06-14 MED ORDER — HYDROCODONE-ACETAMINOPHEN 5-325 MG PO TABS
1.0000 | ORAL_TABLET | Freq: Four times a day (QID) | ORAL | 0 refills | Status: DC | PRN
Start: 2018-06-14 — End: 2018-10-07

## 2018-06-14 MED ORDER — ROCURONIUM BROMIDE 50 MG/5ML IV SOLN
INTRAVENOUS | Status: AC
  Filled 2018-06-14: qty 1

## 2018-06-14 MED ORDER — FENTANYL CITRATE (PF) 100 MCG/2ML IJ SOLN
INTRAMUSCULAR | Status: AC
  Filled 2018-06-14: qty 2

## 2018-06-14 MED ORDER — ACETAMINOPHEN 10 MG/ML IV SOLN
INTRAVENOUS | Status: DC | PRN
  Administered 2018-06-14: 1000 mg via INTRAVENOUS

## 2018-06-14 MED ORDER — CEFAZOLIN SODIUM-DEXTROSE 2-4 GM/100ML-% IV SOLN
INTRAVENOUS | Status: AC
  Filled 2018-06-14: qty 100

## 2018-06-14 MED ORDER — EPHEDRINE SULFATE 50 MG/ML IJ SOLN
INTRAMUSCULAR | Status: AC
  Filled 2018-06-14: qty 1

## 2018-06-14 MED ORDER — PROPOFOL 10 MG/ML IV BOLUS
INTRAVENOUS | Status: AC
  Filled 2018-06-14: qty 20

## 2018-06-14 MED ORDER — LACTATED RINGERS IV SOLN
INTRAVENOUS | Status: DC
  Administered 2018-06-14: 07:00:00 via INTRAVENOUS

## 2018-06-14 MED ORDER — SUGAMMADEX SODIUM 200 MG/2ML IV SOLN
INTRAVENOUS | Status: DC | PRN
  Administered 2018-06-14: 200 mg via INTRAVENOUS

## 2018-06-14 MED ORDER — FAMOTIDINE 20 MG PO TABS
20.0000 mg | ORAL_TABLET | Freq: Once | ORAL | Status: AC
  Administered 2018-06-14: 20 mg via ORAL

## 2018-06-14 MED ORDER — ONDANSETRON HCL 4 MG/2ML IJ SOLN
INTRAMUSCULAR | Status: DC | PRN
  Administered 2018-06-14: 4 mg via INTRAVENOUS

## 2018-06-14 MED ORDER — ONDANSETRON HCL 4 MG/2ML IJ SOLN
INTRAMUSCULAR | Status: AC
  Filled 2018-06-14: qty 2

## 2018-06-14 MED ORDER — SUCCINYLCHOLINE CHLORIDE 20 MG/ML IJ SOLN
INTRAMUSCULAR | Status: AC
  Filled 2018-06-14: qty 1

## 2018-06-14 MED ORDER — DEXAMETHASONE SODIUM PHOSPHATE 10 MG/ML IJ SOLN
INTRAMUSCULAR | Status: DC | PRN
  Administered 2018-06-14: 5 mg via INTRAVENOUS

## 2018-06-14 MED ORDER — ONDANSETRON HCL 4 MG/2ML IJ SOLN
4.0000 mg | Freq: Once | INTRAMUSCULAR | Status: AC | PRN
  Administered 2018-06-14: 4 mg via INTRAVENOUS

## 2018-06-14 MED ORDER — ACETAMINOPHEN 10 MG/ML IV SOLN
INTRAVENOUS | Status: AC
  Filled 2018-06-14: qty 100

## 2018-06-14 MED ORDER — FUROSEMIDE 10 MG/ML IJ SOLN
INTRAMUSCULAR | Status: DC | PRN
  Administered 2018-06-14: 10 mg via INTRAMUSCULAR

## 2018-06-14 MED ORDER — FENTANYL CITRATE (PF) 100 MCG/2ML IJ SOLN
INTRAMUSCULAR | Status: DC | PRN
  Administered 2018-06-14 (×4): 50 ug via INTRAVENOUS
  Administered 2018-06-14: 100 ug via INTRAVENOUS

## 2018-06-14 MED ORDER — FUROSEMIDE 10 MG/ML IJ SOLN
INTRAMUSCULAR | Status: AC
  Filled 2018-06-14: qty 4

## 2018-06-14 MED ORDER — ROCURONIUM BROMIDE 100 MG/10ML IV SOLN
INTRAVENOUS | Status: DC | PRN
  Administered 2018-06-14: 10 mg via INTRAVENOUS
  Administered 2018-06-14: 20 mg via INTRAVENOUS
  Administered 2018-06-14: 50 mg via INTRAVENOUS

## 2018-06-14 MED ORDER — PROPOFOL 10 MG/ML IV BOLUS
INTRAVENOUS | Status: DC | PRN
  Administered 2018-06-14: 200 mg via INTRAVENOUS

## 2018-06-14 MED ORDER — LIDOCAINE HCL (PF) 2 % IJ SOLN
INTRAMUSCULAR | Status: AC
  Filled 2018-06-14: qty 10

## 2018-06-14 MED ORDER — PHENYLEPHRINE HCL 10 MG/ML IJ SOLN
INTRAMUSCULAR | Status: AC
  Filled 2018-06-14: qty 1

## 2018-06-14 MED ORDER — SEVOFLURANE IN SOLN
RESPIRATORY_TRACT | Status: AC
  Filled 2018-06-14: qty 250

## 2018-06-14 MED ORDER — FAMOTIDINE 20 MG PO TABS
ORAL_TABLET | ORAL | Status: AC
  Filled 2018-06-14: qty 1

## 2018-06-14 MED ORDER — SUGAMMADEX SODIUM 200 MG/2ML IV SOLN
INTRAVENOUS | Status: AC
  Filled 2018-06-14: qty 2

## 2018-06-14 MED ORDER — DEXAMETHASONE SODIUM PHOSPHATE 10 MG/ML IJ SOLN
INTRAMUSCULAR | Status: AC
  Filled 2018-06-14: qty 1

## 2018-06-14 MED ORDER — OXYBUTYNIN CHLORIDE 5 MG PO TABS: 5.0000 mg | ORAL_TABLET | Freq: Three times a day (TID) | ORAL | 0 refills | Status: DC | PRN

## 2018-06-14 MED ORDER — FENTANYL CITRATE (PF) 100 MCG/2ML IJ SOLN
25.0000 ug | INTRAMUSCULAR | Status: DC | PRN
  Administered 2018-06-14 (×2): 25 ug via INTRAVENOUS

## 2018-06-14 MED ORDER — FENTANYL CITRATE (PF) 100 MCG/2ML IJ SOLN
INTRAMUSCULAR | Status: AC
  Administered 2018-06-14: 25 ug via INTRAVENOUS
  Filled 2018-06-14: qty 2

## 2018-06-14 MED ORDER — DOCUSATE SODIUM 100 MG PO CAPS: 100.0000 mg | ORAL_CAPSULE | Freq: Two times a day (BID) | ORAL | 0 refills | Status: DC

## 2018-06-14 MED ORDER — LIDOCAINE HCL (CARDIAC) PF 100 MG/5ML IV SOSY
PREFILLED_SYRINGE | INTRAVENOUS | Status: DC | PRN
  Administered 2018-06-14: 60 mg via INTRAVENOUS

## 2018-06-14 SURGICAL SUPPLY — 39 items
ADAPTER IRRIG TUBE 2 SPIKE SOL (ADAPTER) ×8 IMPLANT
BAG DRAIN CYSTO-URO LG1000N (MISCELLANEOUS) ×4 IMPLANT
BAG URINE DRAINAGE (UROLOGICAL SUPPLIES) ×4 IMPLANT
BAG URO DRAIN 4000ML (MISCELLANEOUS) ×4 IMPLANT
CATH FOL 2WAY LX 20X30 (CATHETERS) ×4 IMPLANT
CATH FOL 2WAY LX 22X30 (CATHETERS) IMPLANT
CATH FOL 2WAY LX 24X30 (CATHETERS) IMPLANT
CATH FOLEY 3WAY 30CC 22FR (CATHETERS) IMPLANT
CATH URETL 5X70 OPEN END (CATHETERS) ×4 IMPLANT
CONTAINER COLLECT MORCELLATR (MISCELLANEOUS) ×2 IMPLANT
DRAPE SHEET LG 3/4 BI-LAMINATE (DRAPES) ×4 IMPLANT
DRAPE UTILITY 15X26 TOWEL STRL (DRAPES) ×4 IMPLANT
ELECT LOOP 22F BIPOLAR SML (ELECTROSURGICAL)
ELECTRODE LOOP 22F BIPOLAR SML (ELECTROSURGICAL) IMPLANT
FILTER OVERFLOW MORCELLATOR (FILTER) ×2 IMPLANT
GLOVE BIO SURGEON STRL SZ 6.5 (GLOVE) ×12 IMPLANT
GLOVE BIO SURGEONS STRL SZ 6.5 (GLOVE) ×4
GOWN STRL REUS W/ TWL LRG LVL3 (GOWN DISPOSABLE) ×4 IMPLANT
GOWN STRL REUS W/TWL LRG LVL3 (GOWN DISPOSABLE) ×4
HOLDER FOLEY CATH W/STRAP (MISCELLANEOUS) ×4 IMPLANT
KIT TURNOVER CYSTO (KITS) ×4 IMPLANT
LASER FIBER 550M SMARTSCOPE (Laser) ×4 IMPLANT
LOOP CUT BIPOLAR 24F LRG (ELECTROSURGICAL) IMPLANT
MORCELLATOR COLLECT CONTAINER (MISCELLANEOUS) ×4
MORCELLATOR OVERFLOW FILTER (FILTER) ×4
MORCELLATOR ROTATION 4.75 335 (MISCELLANEOUS) ×4 IMPLANT
PACK CYSTO AR (MISCELLANEOUS) ×4 IMPLANT
SENSORWIRE 0.038 NOT ANGLED (WIRE)
SET CYSTO W/LG BORE CLAMP LF (SET/KITS/TRAYS/PACK) IMPLANT
SET IRRIG Y TYPE TUR BLADDER L (SET/KITS/TRAYS/PACK) ×4 IMPLANT
SET IRRIGATING DISP (SET/KITS/TRAYS/PACK) ×4 IMPLANT
SLEEVE PROTECTION STRL DISP (MISCELLANEOUS) ×8 IMPLANT
SOL .9 NS 3000ML IRR  AL (IV SOLUTION) ×32
SOL .9 NS 3000ML IRR UROMATIC (IV SOLUTION) ×32 IMPLANT
SYR TOOMEY 50ML (SYRINGE) IMPLANT
SYRINGE IRR TOOMEY STRL 70CC (SYRINGE) ×4 IMPLANT
TUBE PUMP MORCELLATOR PIRANHA (TUBING) ×4 IMPLANT
WATER STERILE IRR 1000ML POUR (IV SOLUTION) ×4 IMPLANT
WIRE SENSOR 0.038 NOT ANGLED (WIRE) IMPLANT

## 2018-06-14 NOTE — Interval H&P Note (Signed)
History and Physical Interval Note:  06/14/2018 7:23 AM  Drew Mcmahon  has presented today for surgery, with the diagnosis of BPH with urinary retention  The various methods of treatment have been discussed with the patient and family. After consideration of risks, benefits and other options for treatment, the patient has consented to  Procedure(s): [place] MORCELLATION (N/A) TRANSURETHRAL RESECTION OF THE PROSTATE (TURP) (N/A) as a surgical intervention .  The patient's history has been reviewed, patient examined, no change in status, stable for surgery.  I have reviewed the patient's chart and labs.  Questions were answered to the patient's satisfaction.    RRR CTAB  Hollice Espy

## 2018-06-14 NOTE — Anesthesia Preprocedure Evaluation (Signed)
Anesthesia Evaluation  Patient identified by MRN, date of birth, ID band Patient awake    Reviewed: Allergy & Precautions, H&P , NPO status , Patient's Chart, lab work & pertinent test results, reviewed documented beta blocker date and time   Airway Mallampati: II  TM Distance: >3 FB Neck ROM: full    Dental  (+) Teeth Intact   Pulmonary neg pulmonary ROS, former smoker,    Pulmonary exam normal        Cardiovascular Exercise Tolerance: Good hypertension, On Medications + Peripheral Vascular Disease  negative cardio ROS Normal cardiovascular exam Rate:Normal     Neuro/Psych  Neuromuscular disease negative neurological ROS  negative psych ROS   GI/Hepatic negative GI ROS, Neg liver ROS,   Endo/Other  negative endocrine ROS  Renal/GU negative Renal ROS  negative genitourinary   Musculoskeletal   Abdominal   Peds  Hematology negative hematology ROS (+)   Anesthesia Other Findings   Reproductive/Obstetrics negative OB ROS                             Anesthesia Physical Anesthesia Plan  ASA: III  Anesthesia Plan: General LMA   Post-op Pain Management:    Induction:   PONV Risk Score and Plan:   Airway Management Planned:   Additional Equipment:   Intra-op Plan:   Post-operative Plan:   Informed Consent: I have reviewed the patients History and Physical, chart, labs and discussed the procedure including the risks, benefits and alternatives for the proposed anesthesia with the patient or authorized representative who has indicated his/her understanding and acceptance.     Plan Discussed with: CRNA  Anesthesia Plan Comments:         Anesthesia Quick Evaluation

## 2018-06-14 NOTE — OR Nursing (Signed)
Foley to leg bag (patient's request) with discharge instructions

## 2018-06-14 NOTE — Anesthesia Post-op Follow-up Note (Signed)
Anesthesia QCDR form completed.        

## 2018-06-14 NOTE — Telephone Encounter (Signed)
-----   Message from Hollice Espy, MD sent at 06/14/2018 10:11 AM EDT ----- Regarding: voiding trial This patient needs to be scheduled for voiding trial in 2 days.  Hollice Espy, MD

## 2018-06-14 NOTE — Transfer of Care (Signed)
Immediate Anesthesia Transfer of Care Note  Patient: Drew Mcmahon  Procedure(s) Performed: HOLEP-LASER ENUCLEATION OF THE PROSTATE WITH MORCELLATION (N/A Prostate)  Patient Location: PACU  Anesthesia Type:General  Level of Consciousness: drowsy and patient cooperative  Airway & Oxygen Therapy: Patient Spontanous Breathing and Patient connected to face mask oxygen  Post-op Assessment: Report given to RN and Post -op Vital signs reviewed and stable  Post vital signs: Reviewed and stable  Last Vitals:  Vitals Value Taken Time  BP 129/58 06/14/2018 10:01 AM  Temp 36.1 C 06/14/2018 10:01 AM  Pulse 65 06/14/2018 10:04 AM  Resp 16 06/14/2018 10:04 AM  SpO2 100 % 06/14/2018 10:04 AM  Vitals shown include unvalidated device data.  Last Pain:  Vitals:   06/14/18 1001  PainSc: Asleep         Complications: No apparent anesthesia complications

## 2018-06-14 NOTE — Discharge Instructions (Addendum)
Indwelling Urinary Catheter Care, Adult Take good care of your catheter to keep it working and to prevent problems. How to wear your catheter Attach your catheter to your leg with tape (adhesive tape) or a leg strap. Make sure it is not too tight. If you use tape, remove any bits of tape that are already on the catheter. How to wear a drainage bag You should have:  A large overnight bag.  A small leg bag.  Overnight Bag You may wear the overnight bag at any time. Always keep the bag below the level of your bladder but off the floor. When you sleep, put a clean plastic bag in a wastebasket. Then hang the bag inside the wastebasket. Leg Bag Never wear the leg bag at night. Always wear the leg bag below your knee. Keep the leg bag secure with a leg strap or tape. How to care for your skin  Clean the skin around the catheter at least once every day.  Shower every day. Do not take baths.  Put creams, lotions, or ointments on your genital area only as told by your doctor.  Do not use powders, sprays, or lotions on your genital area. How to clean your catheter and your skin 1. Wash your hands with soap and water. 2. Wet a washcloth in warm water and gentle (mild) soap. 3. Use the washcloth to clean the skin where the catheter enters your body. Clean downward and wipe away from the catheter in small circles. Do not wipe toward the catheter. 4. Pat the area dry with a clean towel. Make sure to clean off all soap. How to care for your drainage bags Empty your drainage bag when it is ?- full or at least 2-3 times a day. Replace your drainage bag once a month or sooner if it starts to smell bad or look dirty. Do not clean your drainage bag unless told by your doctor. Emptying a drainage bag  Supplies Needed  Rubbing alcohol.  Gauze pad or cotton ball.  Tape or a leg strap.  Steps 1. Wash your hands with soap and water. 2. Separate (detach) the bag from your leg. 3. Hold the bag over  the toilet or a clean container. Keep the bag below your hips and bladder. This stops pee (urine) from going back into the tube. 4. Open the pour spout at the bottom of the bag. 5. Empty the pee into the toilet or container. Do not let the pour spout touch any surface. 6. Put rubbing alcohol on a gauze pad or cotton ball. 7. Use the gauze pad or cotton ball to clean the pour spout. 8. Close the pour spout. 9. Attach the bag to your leg with tape or a leg strap. 10. Wash your hands.  Changing a drainage bag Supplies Needed  Alcohol wipes.  A clean drainage bag.  Adhesive tape or a leg strap.  Steps 1. Wash your hands with soap and water. 2. Separate the dirty bag from your leg. 3. Pinch the rubber catheter with your fingers so that pee does not spill out. 4. Separate the catheter tube from the drainage tube where these tubes connect (at the connection valve). Do not let the tubes touch any surface. 5. Clean the end of the catheter tube with an alcohol wipe. Use a different alcohol wipe to clean the end of the drainage tube. 6. Connect the catheter tube to the drainage tube of the clean bag. 7. Attach the new bag to  the leg with adhesive tape or a leg strap. °8. Wash your hands. ° °How to prevent infection and other problems °· Never pull on your catheter or try to remove it. Pulling can damage tissue in your body. °· Always wash your hands before and after touching your catheter. °· If a leg strap gets wet, replace it with a dry one. °· Drink enough fluids to keep your pee clear or pale yellow, or as told by your doctor. °· Do not let the drainage bag or tubing touch the floor. °· Wear cotton underwear. °· If you are male, wipe from front to back after you poop (have a bowel movement). °· Check on the catheter often to make sure it works and the tubing is not twisted. °Get help if: °· Your pee is cloudy. °· Your pee smells unusually bad. °· Your pee is not draining into the bag. °· Your  tube gets clogged. °· Your catheter starts to leak. °· Your bladder feels full. °Get help right away if: °· You have redness, swelling, or pain where the catheter enters your body. °· You have fluid, pus, or a bad smell coming from the area where the catheter enters your body. °· The area where the catheter enters your body feels warm. °· You have a fever. °· You have pain in your: °? Stomach (abdomen). °? Legs. °? Lower back. °? Bladder. °· You see blood fill the catheter. °· Your pee is pink or red. °· You feel sick to your stomach (nauseous). °· You throw up (vomit). °· You have chills. °· Your catheter gets pulled out. °This information is not intended to replace advice given to you by your health care provider. Make sure you discuss any questions you have with your health care provider. °Document Released: 02/07/2013 Document Revised: 09/10/2016 Document Reviewed: 03/28/2014 °Elsevier Interactive Patient Education © 2018 [employer] ° ° °AMBULATORY SURGERY  °DISCHARGE INSTRUCTIONS ° ° °1) The drugs that you were given will stay in your system until tomorrow so for the next 24 hours you should not: ° °A) Drive an automobile °B) Make any legal decisions °C) Drink any alcoholic beverage ° ° °2) You may resume regular meals tomorrow.  Today it is better to start with liquids and gradually work up to solid foods. ° °You may eat anything you prefer, but it is better to start with liquids, then soup and crackers, and gradually work up to solid foods. ° ° °3) Please notify your doctor immediately if you have any unusual bleeding, trouble breathing, redness and pain at the surgery site, drainage, fever, or pain not relieved by medication. ° ° ° °4) Additional Instructions: ° ° ° ° ° ° ° °Please contact your physician with any problems or Same Day Surgery at [phone], Monday through Friday 6 am to 4 pm, or [facility] number at [phone]. ° °

## 2018-06-14 NOTE — Anesthesia Procedure Notes (Signed)
Procedure Name: Intubation Date/Time: 06/14/2018 8:10 AM Performed by: Jonna Clark, CRNA Pre-anesthesia Checklist: Patient identified, Patient being monitored, Timeout performed, Emergency Drugs available and Suction available Patient Re-evaluated:Patient Re-evaluated prior to induction Oxygen Delivery Method: Circle system utilized Preoxygenation: Pre-oxygenation with 100% oxygen Induction Type: IV induction Ventilation: Mask ventilation without difficulty and Oral airway inserted - appropriate to patient size Laryngoscope Size: Mac and 3 Grade View: Grade II Tube type: Oral Tube size: 7.5 mm Number of attempts: 1 Placement Confirmation: ETT inserted through vocal cords under direct vision,  positive ETCO2 and breath sounds checked- equal and bilateral Secured at: 21 cm Tube secured with: Tape Dental Injury: Teeth and Oropharynx as per pre-operative assessment

## 2018-06-14 NOTE — Op Note (Signed)
Date of procedure: 06/14/18  Preoperative diagnosis:  1. BPH with outlet obstruction  Postoperative diagnosis:  1. Same as above  Procedure: 1. Holmium laser enucleation of the prostate with morcellation  Surgeon: Hollice Espy, MD  Anesthesia: General  Complications: None  Intraoperative findings: Elevated bladder neck with discrete median lobe.  Trabeculated bladder with saccules present.  EBL: Minimal  Specimens: Prostate chips  Drains: 20 French two-way Foley catheter with 30 cc balloon  Indication: Drew Mcmahon is a 72 y.o. patient with history of BPH with outlet obstruction and refractory obstructive urinary symptoms.  After reviewing the management options for treatment, he elected to proceed with the above surgical procedure(s). We have discussed the potential benefits and risks of the procedure, side effects of the proposed treatment, the likelihood of the patient achieving the goals of the procedure, and any potential problems that might occur during the procedure or recuperation. Informed consent has been obtained.  Description of procedure:  The patient was taken to the operating room and general anesthesia was induced.  The patient was placed in the dorsal lithotomy position, prepped and draped in the usual sterile fashion, and preoperative antibiotics were administered. A preoperative time-out was performed.   A 26 French resectoscope was placed without difficulty using a blunt angled obturator.  The bladder was carefully inspected and noted to be moderately trabeculated with saccules present.  The bladder neck itself was elevated.  The trigone was visible and a good distance from the elevated bladder neck.  There was a discrete median lobe appreciated.  This point time, 550 m laser fiber was then brought in and using the settings of 1.9 J and 53 Hz, laser was used to make 2 incisions in either side of the median lobe at the 5:00 and 7:00 positions.  This was carried  down to the bladder neck fibers and extended towards the verumontanum.  These 2 incisions made in the midline with care taken to avoid any injury to the sphincter and avoiding resection distal to the verumontanum.  The median lobe was then enucleated in a caudal to cranial fashion cleaving the adenoma from the underlying prostate capsular fibers.  Ultimately, the entire lobe was rolled to the bladder neck and the mucosa was incised freeing this lobe.  Next, a semilunar incision was created on the apex of the left prostate.  Again, care was taken to avoid any resection beyond the verumontanum.  This lobe was enucleated in a similar fashion and caudal to cranial manner rolling the adenoma towards the bladder neck.  Notably, unexplained was created between the capsule and the adenoma where a fair number of prostate calcifications were appreciated.  Ultimately, the anterior commissure mucosa was incised and the adenoma was able to be freed into the bladder.  The same exact procedure was performed for the right lobe.  A few small nodules remained after this was complete were enucleated in a pea-sized fashion.  Hemostasis was achieved using laser fiber settings of 1.5 J and 30 Hz.  At this point time, the prostatic fossa was widely patent.  Careful inspection of the bladder neck revealed no injury to the trigone with clear reflux from both UOs.  Visualization was excellent at this point.  Nephroscope was then brought in and using a Piranha morcellator, all of the prostate tissue was morcellated and removed from the bladder.  Care was taken to do this with a very extended bladder and avoiding injury to the bladder itself.  Once the bladder  was clear, the scope and morcellator were removed.  Bladder was drained and irrigated a few times.  Finally, the scope was removed.  A 20 French two-way Foley catheter was placed over a catheter guide into the bladder.  The balloon was filled with 30 cc of sterile water.  The  catheter irrigated well.  Patient was administered 10 mg of IV Lasix.  He was then clean and dry, repositioned supine position, reversed from anesthesia, taken to PACU in stable condition.  Plan: Patient will return to the office in 2 days for a voiding trial.  I will see him in 6 weeks of follow-up visit for IPSS/ PVR.  Hollice Espy, M.D.

## 2018-06-14 NOTE — Telephone Encounter (Signed)
App made ° ° °Michelle  °

## 2018-06-16 ENCOUNTER — Telehealth: Payer: Self-pay | Admitting: Urology

## 2018-06-16 ENCOUNTER — Ambulatory Visit: Payer: Medicare Other

## 2018-06-16 DIAGNOSIS — N138 Other obstructive and reflux uropathy: Secondary | ICD-10-CM

## 2018-06-16 DIAGNOSIS — N401 Enlarged prostate with lower urinary tract symptoms: Principal | ICD-10-CM

## 2018-06-16 LAB — SURGICAL PATHOLOGY

## 2018-06-16 NOTE — Progress Notes (Signed)
Catheter Removal  Patient is present today for a catheter removal.  58ml of water was drained from the balloon. A 20FR foley cath was removed from the bladder no complications were noted . Patient tolerated well.  Preformed by: Fonnie Jarvis, CMA  Follow up/ Additional notes: keep post op with Dr. Erlene Quan

## 2018-06-16 NOTE — Telephone Encounter (Signed)
At check out pt and wife have questions about do's and dont's post op, state no one had advise him on this. Pt would like to have someone call to explain this to them at [phone], there was no one available to answer questions after check out.

## 2018-06-17 ENCOUNTER — Telehealth: Payer: Self-pay | Admitting: Family Medicine

## 2018-06-17 NOTE — Telephone Encounter (Signed)
-----   Message from Hollice Espy, MD sent at 06/17/2018  8:23 AM EDT ----- Benign pathology.  Good news.    Hollice Espy, MD

## 2018-06-17 NOTE — Telephone Encounter (Signed)
Patient notified

## 2018-06-17 NOTE — Anesthesia Postprocedure Evaluation (Signed)
Anesthesia Post Note  Patient: Drew Mcmahon  Procedure(s) Performed: HOLEP-LASER ENUCLEATION OF THE PROSTATE WITH MORCELLATION (N/A Prostate)  Patient location during evaluation: PACU Anesthesia Type: General Level of consciousness: awake and alert Pain management: pain level controlled Vital Signs Assessment: post-procedure vital signs reviewed and stable Respiratory status: spontaneous breathing, nonlabored ventilation, respiratory function stable and patient connected to nasal cannula oxygen Cardiovascular status: blood pressure returned to baseline and stable Postop Assessment: no apparent nausea or vomiting Anesthetic complications: no     Last Vitals:  Vitals:   06/14/18 1117 06/14/18 1202  BP: (!) 136/51 114/60  Pulse: (!) 45 (!) 53  Resp: 14 16  Temp: (!) 35.6 C (!) 36.2 C  SpO2: 99% 100%    Last Pain:  Vitals:   06/15/18 0819  TempSrc:   PainSc: 0-No pain                 Molli Barrows

## 2018-06-24 ENCOUNTER — Other Ambulatory Visit: Payer: Medicare Other

## 2018-06-29 ENCOUNTER — Ambulatory Visit (INDEPENDENT_AMBULATORY_CARE_PROVIDER_SITE_OTHER): Payer: Medicare Other

## 2018-06-29 VITALS — BP 132/60 | HR 47 | Temp 98.0°F | Resp 14 | Ht 73.0 in | Wt 227.4 lb

## 2018-06-29 DIAGNOSIS — Z23 Encounter for immunization: Secondary | ICD-10-CM

## 2018-06-29 DIAGNOSIS — Z0000 Encounter for general adult medical examination without abnormal findings: Secondary | ICD-10-CM | POA: Diagnosis not present

## 2018-06-29 NOTE — Patient Instructions (Signed)
Drew Mcmahon , Thank you for taking time to come for your Medicare Wellness Visit. I appreciate your ongoing commitment to your health goals. Please review the following plan we discussed and let me know if I can assist you in the future.   Screening recommendations/referrals: Colorectal Screening: Up to date  Vision and Dental Exams: Recommended annual ophthalmology exams for early detection of glaucoma and other disorders of the eye Recommended annual dental exams for proper oral hygiene  Vaccinations: Influenza vaccine: Completed today Pneumococcal vaccine: Up to date Tdap vaccine: Declined. Please call your insurance company to determine your out of pocket expense. You may also receive this vaccine at your local pharmacy or Health Dept. Shingles vaccine: Please call your insurance company to determine your out of pocket expense for the Shingrix vaccine. You may also receive this vaccine at your local pharmacy or Health Dept.    Advanced directives: Advance directive discussed with you today. I have provided a copy for you to complete at home and have notarized. Once this is complete please bring a copy in to our office so we can scan it into your chart.  Goals: Recommend to drink at least 6-8 8oz glasses of water per day.  Next appointment: Please schedule your Annual Wellness Visit with your Nurse Health Advisor in one year.  Preventive Care 15 Years and Older, Male Preventive care refers to lifestyle choices and visits with your health care provider that can promote health and wellness. What does preventive care include?  A yearly physical exam. This is also called an annual well check.  Dental exams once or twice a year.  Routine eye exams. Ask your health care provider how often you should have your eyes checked.  Personal lifestyle choices, including:  Daily care of your teeth and gums.  Regular physical activity.  Eating a healthy diet.  Avoiding tobacco and drug  use.  Limiting alcohol use.  Practicing safe sex.  Taking low doses of aspirin every day.  Taking vitamin and mineral supplements as recommended by your health care provider. What happens during an annual well check? The services and screenings done by your health care provider during your annual well check will depend on your age, overall health, lifestyle risk factors, and family history of disease. Counseling  Your health care provider may ask you questions about your:  Alcohol use.  Tobacco use.  Drug use.  Emotional well-being.  Home and relationship well-being.  Sexual activity.  Eating habits.  History of falls.  Memory and ability to understand (cognition).  Work and work Statistician. Screening  You may have the following tests or measurements:  Height, weight, and BMI.  Blood pressure.  Lipid and cholesterol levels. These may be checked every 5 years, or more frequently if you are over 7 years old.  Skin check.  Lung cancer screening. You may have this screening every year starting at age 12 if you have a 30-pack-year history of smoking and currently smoke or have quit within the past 15 years.  Fecal occult blood test (FOBT) of the stool. You may have this test every year starting at age 55.  Flexible sigmoidoscopy or colonoscopy. You may have a sigmoidoscopy every 5 years or a colonoscopy every 10 years starting at age 54.  Prostate cancer screening. Recommendations will vary depending on your family history and other risks.  Hepatitis C blood test.  Hepatitis B blood test.  Sexually transmitted disease (STD) testing.  Diabetes screening. This is done by  checking your blood sugar (glucose) after you have not eaten for a while (fasting). You may have this done every 1-3 years.  Abdominal aortic aneurysm (AAA) screening. You may need this if you are a current or former smoker.  Osteoporosis. You may be screened starting at age 57 if you are at  high risk. Talk with your health care provider about your test results, treatment options, and if necessary, the need for more tests. Vaccines  Your health care provider may recommend certain vaccines, such as:  Influenza vaccine. This is recommended every year.  Tetanus, diphtheria, and acellular pertussis (Tdap, Td) vaccine. You may need a Td booster every 10 years.  Zoster vaccine. You may need this after age 95.  Pneumococcal 13-valent conjugate (PCV13) vaccine. One dose is recommended after age 29.  Pneumococcal polysaccharide (PPSV23) vaccine. One dose is recommended after age 35. Talk to your health care provider about which screenings and vaccines you need and how often you need them. This information is not intended to replace advice given to you by your health care provider. Make sure you discuss any questions you have with your health care provider. Document Released: 11/09/2015 Document Revised: 07/02/2016 Document Reviewed: 08/14/2015 Elsevier Interactive Patient Education  2017 [place] Prevention in the Home Falls can cause injuries. They can happen to people of all ages. There are many things you can do to make your home safe and to help prevent falls. What can I do on the outside of my home?  Regularly fix the edges of walkways and driveways and fix any cracks.  Remove anything that might make you trip as you walk through a door, such as a raised step or threshold.  Trim any bushes or trees on the path to your home.  Use bright outdoor lighting.  Clear any walking paths of anything that might make someone trip, such as rocks or tools.  Regularly check to see if handrails are loose or broken. Make sure that both sides of any steps have handrails.  Any raised decks and porches should have guardrails on the edges.  Have any leaves, snow, or ice cleared regularly.  Use sand or salt on walking paths during winter.  Clean up any spills in your garage  right away. This includes oil or grease spills. What can I do in the bathroom?  Use night lights.  Install grab bars by the toilet and in the tub and shower. Do not use towel bars as grab bars.  Use non-skid mats or decals in the tub or shower.  If you need to sit down in the shower, use a plastic, non-slip stool.  Keep the floor dry. Clean up any water that spills on the floor as soon as it happens.  Remove soap buildup in the tub or shower regularly.  Attach bath mats securely with double-sided non-slip rug tape.  Do not have throw rugs and other things on the floor that can make you trip. What can I do in the bedroom?  Use night lights.  Make sure that you have a light by your bed that is easy to reach.  Do not use any sheets or blankets that are too big for your bed. They should not hang down onto the floor.  Have a firm chair that has side arms. You can use this for support while you get dressed.  Do not have throw rugs and other things on the floor that can make you trip. What can I  do in the kitchen?  Clean up any spills right away.  Avoid walking on wet floors.  Keep items that you use a lot in easy-to-reach places.  If you need to reach something above you, use a strong step stool that has a grab bar.  Keep electrical cords out of the way.  Do not use floor polish or wax that makes floors slippery. If you must use wax, use non-skid floor wax.  Do not have throw rugs and other things on the floor that can make you trip. What can I do with my stairs?  Do not leave any items on the stairs.  Make sure that there are handrails on both sides of the stairs and use them. Fix handrails that are broken or loose. Make sure that handrails are as long as the stairways.  Check any carpeting to make sure that it is firmly attached to the stairs. Fix any carpet that is loose or worn.  Avoid having throw rugs at the top or bottom of the stairs. If you do have throw rugs,  attach them to the floor with carpet tape.  Make sure that you have a light switch at the top of the stairs and the bottom of the stairs. If you do not have them, ask someone to add them for you. What else can I do to help prevent falls?  Wear shoes that:  Do not have high heels.  Have rubber bottoms.  Are comfortable and fit you well.  Are closed at the toe. Do not wear sandals.  If you use a stepladder:  Make sure that it is fully opened. Do not climb a closed stepladder.  Make sure that both sides of the stepladder are locked into place.  Ask someone to hold it for you, if possible.  Clearly mark and make sure that you can see:  Any grab bars or handrails.  First and last steps.  Where the edge of each step is.  Use tools that help you move around (mobility aids) if they are needed. These include:  Canes.  Walkers.  Scooters.  Crutches.  Turn on the lights when you go into a dark area. Replace any light bulbs as soon as they burn out.  Set up your furniture so you have a clear path. Avoid moving your furniture around.  If any of your floors are uneven, fix them.  If there are any pets around you, be aware of where they are.  Review your medicines with your doctor. Some medicines can make you feel dizzy. This can increase your chance of falling. Ask your doctor what other things that you can do to help prevent falls. This information is not intended to replace advice given to you by your health care provider. Make sure you discuss any questions you have with your health care provider. Document Released: 08/09/2009 Document Revised: 03/20/2016 Document Reviewed: 11/17/2014 Elsevier Interactive Patient Education  2017 [employer].

## 2018-06-29 NOTE — Progress Notes (Signed)
Subjective:   Drew Mcmahon is a 72 y.o. male who presents for Medicare Annual/Subsequent preventive examination.  Review of Systems:  N/A Cardiac Risk Factors include: advanced age (>40men, >55 women);dyslipidemia;hypertension;male gender;obesity (BMI >30kg/m2);sedentary lifestyle     Objective:    Vitals: BP 132/60 (BP Location: Left Arm, Patient Position: Sitting, Cuff Size: Normal)   Pulse (!) 47   Temp 98 F (36.7 C) (Oral)   Resp 14   Ht 6\' 1"  (1.854 m)   Wt 227 lb 6.4 oz (103.1 kg)   SpO2 95%   BMI 30.00 kg/m   Body mass index is 30 kg/m.  Advanced Directives 06/29/2018 06/14/2018 06/04/2018 04/03/2017 03/30/2017 03/26/2017  Does Patient Have a Medical Advance Directive? No No No No No No  Would patient like information on creating a medical advance directive? Yes (MAU/Ambulatory/Procedural Areas - Information given) No - Patient declined No - Patient declined No - Patient declined No - Patient declined -    Tobacco Social History   Tobacco Use  Smoking Status Former Smoker  . Packs/day: 0.50  . Years: 10.00  . Pack years: 5.00  . Types: Cigarettes  . Last attempt to quit: 08/26/1985  . Years since quitting: 32.8  Smokeless Tobacco Never Used  Tobacco Comment   smoking cessation materials not required     Counseling given: No Comment: smoking cessation materials not required  Clinical Intake:  Pre-visit preparation completed: Yes  Pain : No/denies pain   BMI - recorded: 30 Nutritional Status: BMI > 30  Obese Nutritional Risks: None Diabetes: No  How often do you need to have someone help you when you read instructions, pamphlets, or other written materials from your doctor or pharmacy?: 1 - Never  Interpreter Needed?: No  Information entered by :: AEversole, LPN  Past Medical History:  Diagnosis Date  . Elevated lipids   . Hip fracture ([place]) 2007  . Hyperlipidemia   . Hypertension    Past Surgical History:  Procedure Laterality Date  .  APPENDECTOMY  04/03/2017   Incidental APPENDECTOMY;  Surgeon: [name], MD;  Location: [facility] ORS;  Service: General;;  . BOWEL RESECTION  04/03/2017   Procedure: FOCAL SMALL BOWEL RESECTION;  Surgeon: [name], MD;  Location: [facility] ORS;  Service: General;;  . COLONOSCOPY WITH PROPOFOL N/A 06/12/2016   Procedure: COLONOSCOPY WITH PROPOFOL;  Surgeon: Manya Silvas, MD;  Location: [facility] ENDOSCOPY;  Service: Endoscopy;  Laterality: N/A;  . FRACTURE SURGERY Left 2007   hip  . HOLEP-LASER ENUCLEATION OF THE PROSTATE WITH MORCELLATION N/A 06/14/2018   Procedure: HOLEP-LASER ENUCLEATION OF THE PROSTATE WITH MORCELLATION;  Surgeon: Hollice Espy, MD;  Location: [facility] ORS;  Service: Urology;  Laterality: N/A;  . LAPAROSCOPY N/A 04/01/2017   Procedure: LAPAROSCOPY DIAGNOSTIC;  Surgeon: [name], MD;  Location: [facility];  Service: General;  Laterality: N/A;  . LAPAROTOMY N/A 04/03/2017   Procedure: EXPLORATORY LAPAROTOMY;  Surgeon: [name], MD;  Location: [facility] ORS;  Service: General;  Laterality: N/A;  . LYSIS OF ADHESION  04/03/2017   Procedure: LYSIS OF ADHESION;  Surgeon: [name], MD;  Location: [facility] ORS;  Service: General;;   Family History  Problem Relation Age of Onset  . Hypertension Mother   . Alzheimer's disease Mother   . Hypertension Father   . Cancer Father        lung  . Hypertension Sister   . Hypertension Brother   . Hypertension Sister   .  Hypertension Sister   . Hypertension Brother   . Prostate cancer Neg Hx   . Kidney cancer Neg Hx    Social History   Socioeconomic History  . Marital status: Married    Spouse name: Edd Fabian  . Number of children: 3  . Years of education: Not on file  . Highest education level: 12th grade  Occupational History  . Occupation: Retired  Scientific laboratory technician  . Financial resource strain: Not hard at all  . Food insecurity:    Worry: Never true    Inability: Never true  . Transportation needs:    Medical:  No    Non-medical: No  Tobacco Use  . Smoking status: Former Smoker    Packs/day: 0.50    Years: 10.00    Pack years: 5.00    Types: Cigarettes    Last attempt to quit: 08/26/1985    Years since quitting: 32.8  . Smokeless tobacco: Never Used  . Tobacco comment: smoking cessation materials not required  Substance and Sexual Activity  . Alcohol use: No  . Drug use: No  . Sexual activity: Not Currently  Lifestyle  . Physical activity:    Days per week: 0 days    Minutes per session: 0 min  . Stress: Not at all  Relationships  . Social connections:    Talks on phone: Patient refused    Gets together: Patient refused    Attends religious service: Patient refused    Active member of club or organization: Patient refused    Attends meetings of clubs or organizations: Patient refused    Relationship status: Married  Other Topics Concern  . Not on file  Social History Narrative  . Not on file    Outpatient Encounter Medications as of 06/29/2018  Medication Sig  . aspirin (ASPIRIN EC) 81 MG EC tablet Take 81 mg by mouth daily. Swallow whole.  Marland Kitchen atorvastatin (LIPITOR) 20 MG tablet Take 1 tablet (20 mg total) by mouth at bedtime.  . docusate sodium (COLACE) 100 MG capsule Take 1 capsule (100 mg total) by mouth 2 (two) times daily.  Marland Kitchen losartan-hydrochlorothiazide (HYZAAR) 100-25 MG tablet Take 1 tablet by mouth daily.  . metoprolol succinate (TOPROL-XL) 50 MG 24 hr tablet TAKE 1 TABLET BY MOUTH ONCE DAILY TAKE  WITH  OR  IMMEDIATELY  FOLLOWING  A  MEAL  . Multiple Vitamins-Minerals (ONE-A-DAY MENS VITACRAVES PO) Take 1 tablet by mouth daily.   Marland Kitchen oxybutynin (DITROPAN) 5 MG tablet Take 1 tablet (5 mg total) by mouth every 8 (eight) hours as needed for bladder spasms.  . tamsulosin (FLOMAX) 0.4 MG CAPS capsule Take 1 capsule (0.4 mg total) by mouth daily.  Marland Kitchen triamcinolone cream (KENALOG) 0.1 % Apply 1 application topically daily as needed (skin irritation).   . finasteride (PROSCAR) 5 MG  tablet Take 1 tablet (5 mg total) by mouth daily. (Patient not taking: Reported on 06/29/2018)  . HYDROcodone-acetaminophen (NORCO/VICODIN) 5-325 MG tablet Take 1-2 tablets by mouth every 6 (six) hours as needed for moderate pain.   No facility-administered encounter medications on file as of 06/29/2018.     Activities of Daily Living In your present state of health, do you have any difficulty performing the following activities: 06/29/2018 06/04/2018  Hearing? N -  Comment B hearing aids -  Vision? N -  Comment wears eyeglasses -  Difficulty concentrating or making decisions? Y -  Comment long term memory loss -  Walking or climbing stairs? N N  Dressing or bathing? N -  Doing errands, shopping? N N  Preparing Food and eating ? N -  Comment lower partial dentures -  Using the Toilet? N -  In the past six months, have you accidently leaked urine? Y -  Comment dribbling -  Do you have problems with loss of bowel control? N -  Managing your Medications? N -  Managing your Finances? N -  Housekeeping or managing your Housekeeping? N -  Some recent data might be hidden    Patient Care Team: Lada, Satira Anis, MD as PCP - General (Family Medicine) Hollice Espy, MD as Consulting Physician (Urology) Bary Castilla Forest Gleason, MD as Consulting Physician (General Surgery)   Assessment:   This is a routine wellness examination for Jullian.  Exercise Activities and Dietary recommendations Current Exercise Habits: The patient does not participate in regular exercise at present, Exercise limited by: None identified  Goals    . DIET - INCREASE WATER INTAKE     Recommend to drink at least 6-8 8oz glasses of water per day.       Fall Risk Fall Risk  06/29/2018 05/31/2018 03/30/2017 12/01/2016 08/27/2015  Falls in the past year? No No No No Yes  Number falls in past yr: - - - - 1  Injury with Fall? - - - - No  Risk for fall due to : Impaired vision - - - -  Risk for fall due to: Comment wears eyeglasses  - - - -   FALL RISK PREVENTION PERTAINING TO HOME: Is your home free of loose throw rugs in walkways, pet beds, electrical cords, etc? Yes Is there adequate lighting in your home to reduce risk of falls?  Yes Are there stairs in or around your home WITH handrails? Yes  ASSISTIVE DEVICES UTILIZED TO PREVENT FALLS: Use of a cane, walker or w/c? No Grab bars in the bathroom? Yes Shower chair or a place to sit while bathing? Yes An elevated toilet seat or a handicapped toilet? Yes  Timed Get Up and Go Performed: Yes. Pt ambulated 10 feet within 10 sec. Gait stead-fast and without the use of an assistive device.No intervention required at this time. Fall risk prevention has been discussed.  Community Resource Referral:  [place] Referral not required at this time.   Depression Screen PHQ 2/9 Scores 06/29/2018 05/31/2018 12/01/2016 08/27/2015  PHQ - 2 Score 0 0 0 0  PHQ- 9 Score 0 - - -    Cognitive Function     6CIT Screen 06/29/2018  What Year? 0 points  What month? 0 points  What time? 0 points  Count back from 20 0 points  Months in reverse 0 points  Repeat phrase 0 points  Total Score 0    Immunization History  Administered Date(s) Administered  . Influenza, High Dose Seasonal PF 09/02/2017, 06/29/2018  . Influenza,inj,Quad PF,6+ Mos 08/27/2015  . Influenza-Unspecified 09/07/2014, 09/10/2016  . Pneumococcal Conjugate-13 04/10/2014  . Pneumococcal Polysaccharide-23 04/07/2013  . Td 05/18/2008    Qualifies for Shingles Vaccine? Yes. Due for Shingrix. Education has been provided regarding the importance of this vaccine. Pt has been advised to call insurance company to determine out of pocket expense. Advised may also receive vaccine at local pharmacy or Health Dept. Verbalized acceptance and understanding.  Due for Tdap vaccine. Education has been provided regarding the importance of this vaccine. Advised may receive this vaccine at local pharmacy or Health Dept. Aware  to provide a copy of the vaccination  record if obtained from local pharmacy or Health Dept. Verbalized acceptance and understanding.   Screening Tests Health Maintenance  Topic Date Due  . TETANUS/TDAP  06/30/2019 (Originally 05/18/2018)  . COLONOSCOPY  06/12/2026  . INFLUENZA VACCINE  Completed  . Hepatitis C Screening  Completed  . PNA vac Low Risk Adult  Completed   Cancer Screenings: Lung: Low Dose CT Chest recommended if Age 80-80 years, 30 pack-year currently smoking OR have quit w/in 15years. Patient does not qualify. Colorectal: Completed 06/12/16. Repeat every 10 years  Additional Screenings: Hepatitis C Screening: Completed 03/04/16     Plan:  I have personally reviewed and addressed the Medicare Annual Wellness questionnaire and have noted the following in the patient's chart:  A. Medical and social history B. Use of alcohol, tobacco or illicit drugs  C. Current medications and supplements D. Functional ability and status E.  Nutritional status F.  Physical activity G. Advance directives H. List of other physicians I.  Hospitalizations, surgeries, and ER visits in previous 12 months J.  [place] such as hearing and vision if needed, cognitive and depression L. Referrals and appointments  In addition, I have reviewed and discussed with patient certain preventive protocols, quality metrics, and best practice recommendations. A written personalized care plan for preventive services as well as general preventive health recommendations were provided to patient.  See attached scanned questionnaire for additional information.   Signed,  Aleatha Borer, LPN Nurse Health Advisor

## 2018-07-04 ENCOUNTER — Telehealth: Payer: Self-pay | Admitting: Family Medicine

## 2018-07-04 DIAGNOSIS — Z8781 Personal history of (healed) traumatic fracture: Secondary | ICD-10-CM

## 2018-07-04 HISTORY — DX: Personal history of (healed) traumatic fracture: Z87.81

## 2018-07-04 NOTE — Telephone Encounter (Signed)
Please let the patient know that since he has a hx of a hip fracture, I'll recommend a DEXA scan I don't see one done in the last several years Please let him know how to schedule that Thank you

## 2018-07-04 NOTE — Assessment & Plan Note (Signed)
Order DEXA 

## 2018-07-05 NOTE — Telephone Encounter (Signed)
Left detailed voicemail

## 2018-07-14 ENCOUNTER — Telehealth: Payer: Self-pay | Admitting: Urology

## 2018-07-14 NOTE — Telephone Encounter (Signed)
Spoke with patient's wife and she was reassured that incontience is to be expected for 6-8 weeks post HOLEP. She states patient is also having increased frequency and urgency. Patient was instructed to drop off a urine to rule out infection. Patient will call back to schedule , patient's wife unable to schedule at this time.

## 2018-07-14 NOTE — Telephone Encounter (Signed)
Pt had some questions post surgery.  He is leaking a lot more than he thought he should be.  He wants to know if this is normal or if there's some type of medication he could take.  Please call pt's wife Antrell Tipler [phone]

## 2018-07-15 ENCOUNTER — Other Ambulatory Visit: Payer: Medicare Other

## 2018-07-15 ENCOUNTER — Other Ambulatory Visit: Payer: Self-pay

## 2018-07-15 DIAGNOSIS — R399 Unspecified symptoms and signs involving the genitourinary system: Secondary | ICD-10-CM

## 2018-07-15 LAB — URINALYSIS, COMPLETE
BILIRUBIN UA: NEGATIVE
GLUCOSE, UA: NEGATIVE
KETONES UA: NEGATIVE
NITRITE UA: NEGATIVE
Protein, UA: NEGATIVE
Specific Gravity, UA: 1.025 (ref 1.005–1.030)
UUROB: 0.2 mg/dL (ref 0.2–1.0)
pH, UA: 6 (ref 5.0–7.5)

## 2018-07-15 LAB — MICROSCOPIC EXAMINATION
Bacteria, UA: NONE SEEN
Epithelial Cells (non renal): NONE SEEN /hpf (ref 0–10)

## 2018-07-17 LAB — CULTURE, URINE COMPREHENSIVE

## 2018-07-19 ENCOUNTER — Ambulatory Visit
Admission: RE | Admit: 2018-07-19 | Discharge: 2018-07-19 | Disposition: A | Discharge status: Home / Self Care | Payer: Medicare Other | Source: Ambulatory Visit | Attending: Family Medicine | Admitting: Family Medicine

## 2018-07-19 ENCOUNTER — Encounter: Payer: Self-pay | Admitting: Family Medicine

## 2018-07-19 DIAGNOSIS — M85851 Other specified disorders of bone density and structure, right thigh: Secondary | ICD-10-CM | POA: Insufficient documentation

## 2018-07-19 DIAGNOSIS — Z13820 Encounter for screening for osteoporosis: Secondary | ICD-10-CM | POA: Diagnosis not present

## 2018-07-19 DIAGNOSIS — Z8781 Personal history of (healed) traumatic fracture: Secondary | ICD-10-CM | POA: Diagnosis not present

## 2018-07-19 DIAGNOSIS — M8580 Other specified disorders of bone density and structure, unspecified site: Secondary | ICD-10-CM

## 2018-07-19 HISTORY — DX: Other specified disorders of bone density and structure, unspecified site: M85.80

## 2018-07-28 ENCOUNTER — Ambulatory Visit (INDEPENDENT_AMBULATORY_CARE_PROVIDER_SITE_OTHER): Payer: Medicare Other | Admitting: Urology

## 2018-07-28 ENCOUNTER — Encounter: Payer: Self-pay | Admitting: Urology

## 2018-07-28 VITALS — BP 181/65 | HR 60 | Ht 72.0 in | Wt 230.0 lb

## 2018-07-28 DIAGNOSIS — N138 Other obstructive and reflux uropathy: Secondary | ICD-10-CM | POA: Diagnosis not present

## 2018-07-28 DIAGNOSIS — N401 Enlarged prostate with lower urinary tract symptoms: Secondary | ICD-10-CM | POA: Diagnosis not present

## 2018-07-28 DIAGNOSIS — N393 Stress incontinence (female) (male): Secondary | ICD-10-CM

## 2018-07-28 DIAGNOSIS — N3941 Urge incontinence: Secondary | ICD-10-CM

## 2018-07-28 DIAGNOSIS — N3000 Acute cystitis without hematuria: Secondary | ICD-10-CM

## 2018-07-28 LAB — URINALYSIS, COMPLETE
BILIRUBIN UA: NEGATIVE
Glucose, UA: NEGATIVE
KETONES UA: NEGATIVE
Nitrite, UA: NEGATIVE
PH UA: 7.5 (ref 5.0–7.5)
Specific Gravity, UA: 1.02 (ref 1.005–1.030)
UUROB: 2 mg/dL — AB (ref 0.2–1.0)

## 2018-07-28 LAB — MICROSCOPIC EXAMINATION

## 2018-07-28 LAB — BLADDER SCAN AMB NON-IMAGING

## 2018-07-28 MED ORDER — SULFAMETHOXAZOLE-TRIMETHOPRIM 800-160 MG PO TABS: 1.0000 | ORAL_TABLET | Freq: Two times a day (BID) | ORAL | 0 refills | Status: DC

## 2018-07-28 NOTE — Progress Notes (Signed)
07/28/2018 9:27 AM   Drew Mcmahon Nov 19, 1945 [id number]  Referring provider: Guadalupe Maple, MD [address] [place], [place] [address]  Chief Complaint  Patient presents with  . Benign Prostatic Hypertrophy    6wk Mcmahon op    HPI: 72 year old male with history of BPH, bladder outlet obstruction status Mcmahon holmium laser enucleation of the prostate on 06/14/2018.  Surgery was uncomplicated.  Surgical pathology consistent with benign hyperplasia.  41 g of tissue were resected.  Preop TRUS volume 57 g with a median lobe, small.    He reports that since surgery, his stream is now excellent.  He feels like he is emptying his bladder.  He had significant and severe urinary urgency and frequency.  This was improving until last week.  He also developed dysuria over the past week.  He has had intermittent gross hematuria since surgery.  He also leaks in the daytime with physical activity which is slightly improved.  At nighttime, he is dry.  He also has occasions where he can get to the bathroom on time and he is on himself.  He is currently wearing depends during the daytime and nothing at night.  He came in a few weeks ago with irritative voiding symptoms.  UA the time is not particularly suspicious and culture was nonclonal.  No fevers or chills.  UA today mildly suspicious for infection, culture sent.   PMH: Past Medical History:  Diagnosis Date  . Elevated lipids   . Hip fracture ([place]) 2007  . Hyperlipidemia   . Hypertension   . Osteopenia 07/19/2018   Sept 2019; next scan Sept 2021    Surgical History: Past Surgical History:  Procedure Laterality Date  . APPENDECTOMY  04/03/2017   Incidental APPENDECTOMY;  Surgeon: [name], MD;  Location: [facility] ORS;  Service: General;;  . BOWEL RESECTION  04/03/2017   Procedure: FOCAL SMALL BOWEL RESECTION;  Surgeon: [name], MD;  Location: [facility] ORS;  Service: General;;  . COLONOSCOPY WITH PROPOFOL N/A 06/12/2016   Procedure: COLONOSCOPY WITH PROPOFOL;  Surgeon: Manya Silvas, MD;  Location: [facility] ENDOSCOPY;  Service: Endoscopy;  Laterality: N/A;  . FRACTURE SURGERY Left 2007   hip  . HOLEP-LASER ENUCLEATION OF THE PROSTATE WITH MORCELLATION N/A 06/14/2018   Procedure: HOLEP-LASER ENUCLEATION OF THE PROSTATE WITH MORCELLATION;  Surgeon: Hollice Espy, MD;  Location: [facility] ORS;  Service: Urology;  Laterality: N/A;  . LAPAROSCOPY N/A 04/01/2017   Procedure: LAPAROSCOPY DIAGNOSTIC;  Surgeon: [name], MD;  Location: [facility];  Service: General;  Laterality: N/A;  . LAPAROTOMY N/A 04/03/2017   Procedure: EXPLORATORY LAPAROTOMY;  Surgeon: [name], MD;  Location: [facility] ORS;  Service: General;  Laterality: N/A;  . LYSIS OF ADHESION  04/03/2017   Procedure: LYSIS OF ADHESION;  Surgeon: [name], MD;  Location: [facility] ORS;  Service: General;;    Home Medications:  Allergies as of 07/28/2018   No Known Allergies     Medication List        Accurate as of 07/28/18  9:27 AM. Always use your most recent med list.          aspirin EC 81 MG EC tablet Generic drug:  aspirin Take 81 mg by mouth daily. Swallow whole.   atorvastatin 20 MG tablet Commonly known as:  LIPITOR Take 1 tablet (20 mg total) by mouth at bedtime.   docusate sodium 100 MG capsule Commonly known as:  COLACE Take 1 capsule (100  mg total) by mouth 2 (two) times daily.   HYDROcodone-acetaminophen 5-325 MG tablet Commonly known as:  NORCO/VICODIN Take 1-2 tablets by mouth every 6 (six) hours as needed for moderate pain.   losartan-hydrochlorothiazide 100-25 MG tablet Commonly known as:  HYZAAR Take 1 tablet by mouth daily.   metoprolol succinate 50 MG 24 hr tablet Commonly known as:  TOPROL-XL TAKE 1 TABLET BY MOUTH ONCE DAILY TAKE  WITH  OR  IMMEDIATELY  FOLLOWING  A  MEAL   ONE-A-DAY MENS VITACRAVES PO Take 1 tablet by mouth daily.   sulfamethoxazole-trimethoprim 800-160 MG tablet Commonly known as:   BACTRIM DS,SEPTRA DS Take 1 tablet by mouth every 12 (twelve) hours.   triamcinolone cream 0.1 % Commonly known as:  KENALOG Apply 1 application topically daily as needed (skin irritation).       Allergies: No Known Allergies  Family History: Family History  Problem Relation Age of Onset  . Hypertension Mother   . Alzheimer's disease Mother   . Hypertension Father   . Cancer Father        lung  . Hypertension Sister   . Hypertension Brother   . Hypertension Sister   . Hypertension Sister   . Hypertension Brother   . Prostate cancer Neg Hx   . Kidney cancer Neg Hx     Social History:  reports that he quit smoking about 32 years ago. His smoking use included cigarettes. He has a 5.00 pack-year smoking history. He has never used smokeless tobacco. He reports that he does not drink alcohol or use drugs.  ROS: UROLOGY Frequent Urination?: Yes Hard to postpone urination?: No Burning/pain with urination?: Yes Get up at night to urinate?: Yes Leakage of urine?: Yes Urine stream starts and stops?: No Trouble starting stream?: No Do you have to strain to urinate?: No Blood in urine?: No Urinary tract infection?: No Sexually transmitted disease?: No Injury to kidneys or bladder?: No Painful intercourse?: No Weak stream?: No Erection problems?: No Penile pain?: No  Gastrointestinal Nausea?: No Vomiting?: No Indigestion/heartburn?: No Diarrhea?: No Constipation?: No  Constitutional Fever: No Night sweats?: No Weight loss?: No Fatigue?: No  Skin Skin rash/lesions?: No Itching?: No  Eyes Blurred vision?: No Double vision?: No  Ears/Nose/Throat Sore throat?: No Sinus problems?: No  Hematologic/Lymphatic Swollen glands?: No Easy bruising?: No  Cardiovascular Leg swelling?: No Chest pain?: No  Respiratory Cough?: No Shortness of breath?: No  Endocrine Excessive thirst?: No  Musculoskeletal Back pain?: No Joint pain?:  No  Neurological Headaches?: No Dizziness?: No  Psychologic Depression?: No Anxiety?: No  Physical Exam: BP (!) 181/65   Pulse 60   Ht 6' (1.829 m)   Wt 230 lb (104.3 kg)   BMI 31.19 kg/m   Constitutional:  Alert and oriented, No acute distress.  Accompanied by wife today. HEENT: [place] AT, moist mucus membranes.  Trachea midline, no masses. Cardiovascular: No clubbing, cyanosis, or edema. Respiratory: Normal respiratory effort, no increased work of breathing. Skin: No rashes, bruises or suspicious lesions. Neurologic: Grossly intact, no focal deficits, moving all 4 extremities. Psychiatric: Normal mood and affect.  Laboratory Data: Lab Results  Component Value Date   WBC 7.2 06/04/2018   HGB 15.0 06/04/2018   HCT 43.0 06/04/2018   MCV 92.1 06/04/2018   PLT 189 06/04/2018    Lab Results  Component Value Date   CREATININE 0.91 06/04/2018    Urinalysis UA reviewed, see epic  Pertinent Imaging: Results for orders placed or performed in visit  on 07/28/18  BLADDER SCAN AMB NON-IMAGING  Result Value Ref Range   Scan Result 46mlf     Assessment & Plan:    1. Benign prostatic hyperplasia with urinary obstruction Status Mcmahon holmium laser enucleation the prostate Surgical pathology reviewed with patient today Continues to have irritative voiding symptoms postop with concern for possible UTI Okay to stop Flomax and finasteride at this point given excellent stream and adequate emptying - BLADDER SCAN AMB NON-IMAGING - Urinalysis, Complete - CULTURE, URINE COMPREHENSIVE  2. Acute cystitis without hematuria UA today concerning for possible infection, positive for leukocytes, blood, and moderate bacteria - sulfamethoxazole-trimethoprim (BACTRIM DS,SEPTRA DS) 800-160 MG tablet; Take 1 tablet by mouth every 12 (twelve) hours.  Dispense: 14 tablet; Refill: 0  3. Stress incontinence, male We discussed that this is often normal after enucleation of the prostate, especially  with the amount of tissue resected I have encouraged him to perform pelvic floor exercises If he is not improving in the next 4 weeks, will refer to physical therapy I anticipate gradual improvement and offered him reassurance today  4. Urge incontinence Samples of Myrbetriq 25 mg x 4 weeks given today with postoperative irritative voiding symptoms He will contact us in about 4 weeks and let us know how things are going  Return in about 3 months (around 10/28/2018) for IPSS/ PVR.  Hollice Espy, MD  [facility] [address], [place], [place] [address] [phone]

## 2018-07-28 NOTE — Patient Instructions (Signed)
Kegel Exercises Kegel exercises help strengthen the muscles that support the rectum, vagina, small intestine, bladder, and uterus. Doing Kegel exercises can help:  Improve bladder and bowel control.  Improve sexual response.  Reduce problems and discomfort during pregnancy.  Kegel exercises involve squeezing your pelvic floor muscles, which are the same muscles you squeeze when you try to stop the flow of urine. The exercises can be done while sitting, standing, or lying down, but it is best to vary your position. Phase 1 exercises 1. Squeeze your pelvic floor muscles tight. You should feel a tight lift in your rectal area. If you are a male, you should also feel a tightness in your vaginal area. Keep your stomach, buttocks, and legs relaxed. 2. Hold the muscles tight for up to 10 seconds. 3. Relax your muscles. Repeat this exercise 50 times a day or as many times as told by your health care provider. Continue to do this exercise for at least 4-6 weeks or for as long as told by your health care provider. This information is not intended to replace advice given to you by your health care provider. Make sure you discuss any questions you have with your health care provider. Document Released: 09/29/2012 Document Revised: 06/07/2016 Document Reviewed: 09/02/2015 Elsevier Interactive Patient Education  2018 Elsevier Inc.  

## 2018-07-31 LAB — CULTURE, URINE COMPREHENSIVE

## 2018-08-02 ENCOUNTER — Telehealth: Payer: Self-pay | Admitting: Family Medicine

## 2018-08-02 NOTE — Telephone Encounter (Signed)
-----   Message from Hollice Espy, MD sent at 08/02/2018  3:43 PM EDT ----- Please let him know that he does have a UTI.  Bactrim should be appropriate.  Hollice Espy, MD

## 2018-08-02 NOTE — Telephone Encounter (Signed)
Patient notified and voiced understanding.

## 2018-08-17 ENCOUNTER — Ambulatory Visit: Payer: Medicare Other | Admitting: Urology

## 2018-08-24 ENCOUNTER — Telehealth: Payer: Self-pay | Admitting: *Deleted

## 2018-08-24 NOTE — Telephone Encounter (Signed)
Patients wife called and wanted to know how urgent it was for patient to get surgery for his hernia.He was seen on 05/25/18. He wanted to know if it was okay to wait until December to have surgery.

## 2018-08-24 NOTE — Telephone Encounter (Signed)
Patient's wife is asking if patient can wait till December to have incisional hernia surgery. She denies her husband having pain. His bowels are moving daily. She was asked to call in November to schedule an appointment to update H&P and she stated she would call.

## 2018-09-22 ENCOUNTER — Telehealth: Payer: Self-pay | Admitting: Family Medicine

## 2018-09-22 DIAGNOSIS — Z5181 Encounter for therapeutic drug level monitoring: Secondary | ICD-10-CM

## 2018-09-22 MED ORDER — VALSARTAN-HYDROCHLOROTHIAZIDE 160-25 MG PO TABS: 1.0000 | ORAL_TABLET | Freq: Every day | ORAL | 0 refills | Status: DC

## 2018-09-22 NOTE — Telephone Encounter (Signed)
Apparently, patient's losartan/hctz was involved in a recall I called to let him know, sending new Rx to [facility] Return in one week for recheck BP and BMP

## 2018-09-28 ENCOUNTER — Other Ambulatory Visit: Payer: Self-pay | Admitting: Family Medicine

## 2018-09-28 DIAGNOSIS — I10 Essential (primary) hypertension: Secondary | ICD-10-CM

## 2018-10-04 ENCOUNTER — Encounter: Payer: Self-pay | Admitting: Urology

## 2018-10-07 ENCOUNTER — Other Ambulatory Visit: Payer: Self-pay

## 2018-10-07 ENCOUNTER — Encounter: Payer: Self-pay | Admitting: General Surgery

## 2018-10-07 ENCOUNTER — Ambulatory Visit: Payer: Medicare Other | Admitting: General Surgery

## 2018-10-07 VITALS — BP 152/80 | HR 78 | Temp 97.8°F | Resp 14 | Ht 72.0 in | Wt 232.0 lb

## 2018-10-07 DIAGNOSIS — K439 Ventral hernia without obstruction or gangrene: Secondary | ICD-10-CM

## 2018-10-07 NOTE — Patient Instructions (Signed)
Hernia, Adult A hernia is the bulging of an organ or tissue through a weak spot in the muscles of the abdomen (abdominal wall). Hernias develop most often near the navel or groin. There are many kinds of hernias. Common kinds include:  Femoral hernia. This kind of hernia develops under the groin in the upper thigh area.  Inguinal hernia. This kind of hernia develops in the groin or scrotum.  Umbilical hernia. This kind of hernia develops near the navel.  Hiatal hernia. This kind of hernia causes part of the stomach to be pushed up into the chest.  Incisional hernia. This kind of hernia bulges through a scar from an abdominal surgery.  What are the causes? This condition may be caused by:  Heavy lifting.  Coughing over a long period of time.  Straining to have a bowel movement.  An incision made during an abdominal surgery.  A birth defect (congenital defect).  Excess weight or obesity.  Smoking.  Poor nutrition.  Cystic fibrosis.  Excess fluid in the abdomen.  Undescended testicles.  What are the signs or symptoms? Symptoms of a hernia include:  A lump on the abdomen. This is the first sign of a hernia. The lump may become more obvious with standing, straining, or coughing. It may get bigger over time if it is not treated or if the condition causing it is not treated.  Pain. A hernia is usually painless, but it may become painful over time if treatment is delayed. The pain is usually dull and may get worse with standing or lifting heavy objects.  Sometimes a hernia gets tightly squeezed in the weak spot (strangulated) or stuck there (incarcerated) and causes additional symptoms. These symptoms may include:  Vomiting.  Nausea.  Constipation.  Irritability.  How is this diagnosed? A hernia may be diagnosed with:  A physical exam. During the exam your health care provider may ask you to cough or to make a specific movement, because a hernia is usually more  visible when you move.  Imaging tests. These can include: ? X-rays. ? Ultrasound. ? CT scan.  How is this treated? A hernia that is small and painless may not need to be treated. A hernia that is large or painful may be treated with surgery. Inguinal hernias may be treated with surgery to prevent incarceration or strangulation. Strangulated hernias are always treated with surgery, because lack of blood to the trapped organ or tissue can cause it to die. Surgery to treat a hernia involves pushing the bulge back into place and repairing the weak part of the abdomen. Follow these instructions at home:  Avoid straining.  Do not lift anything heavier than 10 lb (4.5 kg).  Lift with your leg muscles, not your back muscles. This helps avoid strain.  When coughing, try to cough gently.  Prevent constipation. Constipation leads to straining with bowel movements, which can make a hernia worse or cause a hernia repair to break down. You can prevent constipation by: ? Eating a high-fiber diet that includes plenty of fruits and vegetables. ? Drinking enough fluids to keep your urine clear or pale yellow. Aim to drink 6-8 glasses of water per day. ? Using a stool softener as directed by your health care provider.  Lose weight, if you are overweight.  Do not use any tobacco products, including cigarettes, chewing tobacco, or electronic cigarettes. If you need help quitting, ask your health care provider.  Keep all follow-up visits as directed by your health care   provider. This is important. Your health care provider may need to monitor your condition. Contact a health care provider if:  You have swelling, redness, and pain in the affected area.  Your bowel habits change. Get help right away if:  You have a fever.  You have abdominal pain that is getting worse.  You feel nauseous or you vomit.  You cannot push the hernia back in place by gently pressing on it while you are lying  down.  The hernia: ? Changes in shape or size. ? Is stuck outside the abdomen. ? Becomes discolored. ? Feels hard or tender. This information is not intended to replace advice given to you by your health care provider. Make sure you discuss any questions you have with your health care provider. Document Released: 10/13/2005 Document Revised: 03/12/2016 Document Reviewed: 08/23/2014 Elsevier Interactive Patient Education  2017 [employer].  Any allergies you have.  All medicines you are taking, including vitamins, herbs, eye drops, creams, and over-the-counter medicines.  Any problems you or family members have had with anesthetic medicines.  Any blood disorders you have.  Any surgeries you have had.  Any medical conditions you have.  Whether you are pregnant or may be pregnant. What are the risks? Generally, this is a safe procedure. However, problems may occur, including:  Infection.  Bleeding.  Allergic reactions to medicines.  Damage to other structures or organs in the abdomen.  Trouble urinating or having a bowel movement after surgery.  Pneumonia.  Blood clots.  The hernia coming back after surgery.  Fluid buildup in the area of the hernia.  In some cases, your health care provider may need to switch from a laparoscopic procedure to a procedure that is done through a single, larger incision in the abdomen (open procedure). You may need an open procedure if:  You have a hernia that is difficult to repair.  Your organs are hard to see.  You have bleeding problems during the laparoscopic procedure.  What happens before the procedure? Staying hydrated Follow instructions from your health care provider about hydration, which may include:  Up to 2 hours before the procedure - you may continue to drink clear liquids, such as water, clear fruit juice, black coffee, and plain tea.  Eating and drinking restrictions Follow instructions from your health care  provider about eating and drinking, which may include:  8 hours before the procedure - stop eating heavy meals or foods such as meat, fried foods, or fatty foods.  6 hours before the procedure - stop eating light meals or foods, such as toast or cereal.  6 hours before the procedure - stop drinking milk or drinks that contain milk.  2 hours before the procedure - stop drinking clear liquids.  Medicines  Ask your health care provider about: ? Changing or stopping your regular medicines. This is especially important if you are taking diabetes medicines or blood thinners. ? Taking medicines such as aspirin and ibuprofen. These medicines can thin your blood. Do not take these medicines before your procedure if your health care provider instructs you not to.  You may be given antibiotic medicine to help prevent infection. General instructions  You may be asked to take a laxative or do an enema to empty your bowel before surgery (bowel prep).  Do not use any products that contain nicotine or tobacco, such as cigarettes and e-cigarettes. If you need help quitting, ask your health care provider.  You may need to have tests before  the procedure, such as: ? Blood tests. ? Urine tests. ? Abdominal ultrasound. ? Chest X-ray. ? Electrocardiogram (ECG).  Plan to have someone take you home from the hospital or clinic.  If you will be going home right after the procedure, plan to have someone with you for 24 hours. What happens during the procedure?  To reduce your risk of infection: ? Your health care team will wash or sanitize their hands. ? Your skin will be washed with soap.  An IV tube will be inserted into one of your veins.  You will be given one or more of the following: ? A medicine to help you relax (sedative). ? A medicine to make you fall asleep (general anesthetic).  A small incision will be made in your abdomen. A hollow metal tube (trocar) will be placed through the  incision.  A tube will be placed through the trocar to inflate your abdomen with air-like gas. This makes it easier for your surgeon to see inside your abdomen and do the repair.  The laparoscope will be inserted into your abdomen through the trocar. The laparoscope will send images to a monitor in the operating room.  Other trocars will be put through other small incisions in your abdomen. The surgical instruments needed for the procedure will be placed through these trocars.  The tissue or intestines that make up the hernia will be moved back into place.  The edges of the hernia may be stitched together.  A piece of mesh will be used to close the hernia. Stitches (sutures), clips, or staples will be used to keep the mesh in place.  A bandage (dressing) or skin glue will be put over the incisions. The procedure may vary among health care providers and hospitals. What happens after the procedure?  Your blood pressure, heart rate, breathing rate, and blood oxygen level will be monitored until the medicines you were given have worn off.  You will continue to receive fluids and medicines through an IV tube. Your IV tube will be removed when you can drink clear fluids.  You will be given pain medicine as needed.  You will be encouraged to get up and walk around as soon as possible.  You may have to wear compression stockings. These stockings help to prevent blood clots and reduce swelling in your legs.  You will be shown how to do deep breathing exercises to help prevent a lung infection.  Do not drive for 24 hours if you were given a sedative. This information is not intended to replace advice given to you by your health care provider. Make sure you discuss any questions you have with your health care provider. Document Released: 09/29/2012 Document Revised: 05/30/2016 Document Reviewed: 05/30/2016 Elsevier Interactive Patient Education  [employer].

## 2018-10-07 NOTE — Progress Notes (Signed)
Patient ID: Drew Mcmahon, male   DOB: 11-06-1945, 72 y.o.   MRN: [id number]  Chief Complaint  Patient presents with  . Follow-up    HPI Drew Mcmahon is a 73 y.o. male here today for his follow up  incisional hernia. Patient states no change since last visit.  HPI  Past Medical History:  Diagnosis Date  . Elevated lipids   . Hip fracture ([place]) 2007  . Hyperlipidemia   . Hypertension   . Osteopenia 07/19/2018   Sept 2019; next scan Sept 2021    Past Surgical History:  Procedure Laterality Date  . APPENDECTOMY  04/03/2017   Incidental APPENDECTOMY;  Surgeon: [name], MD;  Location: [facility] ORS;  Service: General;;  . BOWEL RESECTION  04/03/2017   Procedure: FOCAL SMALL BOWEL RESECTION;  Surgeon: [name], MD;  Location: [facility] ORS;  Service: General;;  . COLONOSCOPY WITH PROPOFOL N/A 06/12/2016   Procedure: COLONOSCOPY WITH PROPOFOL;  Surgeon: Manya Silvas, MD;  Location: [facility] ENDOSCOPY;  Service: Endoscopy;  Laterality: N/A;  . FRACTURE SURGERY Left 2007   hip  . HOLEP-LASER ENUCLEATION OF THE PROSTATE WITH MORCELLATION N/A 06/14/2018   Procedure: HOLEP-LASER ENUCLEATION OF THE PROSTATE WITH MORCELLATION;  Surgeon: Hollice Espy, MD;  Location: [facility] ORS;  Service: Urology;  Laterality: N/A;  . LAPAROSCOPY N/A 04/01/2017   Procedure: LAPAROSCOPY DIAGNOSTIC;  Surgeon: [name], MD;  Location: [facility];  Service: General;  Laterality: N/A;  . LAPAROTOMY N/A 04/03/2017   Procedure: EXPLORATORY LAPAROTOMY;  Surgeon: [name], MD;  Location: [facility] ORS;  Service: General;  Laterality: N/A;  . LYSIS OF ADHESION  04/03/2017   Procedure: LYSIS OF ADHESION;  Surgeon: [name], MD;  Location: [facility] ORS;  Service: General;;    Family History  Problem Relation Age of Onset  . Hypertension Mother   . Alzheimer's disease Mother   . Hypertension Father   . Cancer Father        lung  . Hypertension Sister   . Hypertension Brother   . Hypertension  Sister   . Hypertension Sister   . Hypertension Brother   . Prostate cancer Neg Hx   . Kidney cancer Neg Hx     Social History Social History   Tobacco Use  . Smoking status: Former Smoker    Packs/day: 0.50    Years: 10.00    Pack years: 5.00    Types: Cigarettes    Last attempt to quit: 08/26/1985    Years since quitting: 33.1  . Smokeless tobacco: Never Used  . Tobacco comment: smoking cessation materials not required  Substance Use Topics  . Alcohol use: No  . Drug use: No    No Known Allergies  Current Outpatient Medications  Medication Sig Dispense Refill  . aspirin (ASPIRIN EC) 81 MG EC tablet Take 81 mg by mouth daily. Swallow whole.    Marland Kitchen atorvastatin (LIPITOR) 20 MG tablet Take 1 tablet (20 mg total) by mouth at bedtime. 90 tablet 1  . docusate sodium (COLACE) 100 MG capsule Take 1 capsule (100 mg total) by mouth 2 (two) times daily. 60 capsule 0  . metoprolol succinate (TOPROL-XL) 50 MG 24 hr tablet TAKE 1 TABLET BY MOUTH ONCE DAILY TAKE  WITH  OR  IMMEDIATELY  FOLLOWING  A  MEAL 90 tablet 1  . Multiple Vitamins-Minerals (ONE-A-DAY MENS VITACRAVES PO) Take 1 tablet by mouth daily.     Marland Kitchen triamcinolone cream (KENALOG) 0.1 % Apply  1 application topically daily as needed (skin irritation).     . valsartan-hydrochlorothiazide (DIOVAN HCT) 160-25 MG tablet Take 1 tablet by mouth daily. For blood pressure; this replaces losartan-hctz 30 tablet 0   No current facility-administered medications for this visit.     Review of Systems Review of Systems  Constitutional: Negative.   Respiratory: Negative.   Cardiovascular: Negative.     Blood pressure (!) 152/80, pulse 78, temperature 97.8 F (36.6 C), temperature source Skin, resp. rate 14, height 6' (1.829 m), weight 232 lb (105.2 kg), SpO2 98 %.  Physical Exam Physical Exam Neck:     Musculoskeletal: Neck supple.  Cardiovascular:     Rate and Rhythm: Normal rate and regular rhythm.  Pulmonary:     Effort:  Pulmonary effort is normal.     Breath sounds: Normal breath sounds.  Abdominal:     General: Bowel sounds are normal.     Palpations: Abdomen is soft.     Hernia: A hernia is present.    Skin:    General: Skin is warm and dry.  Neurological:     Mental Status: He is alert and oriented to person, place, and time.     Data Reviewed Colonoscopy dated June 12, 2016 completed by Gaylyn Cheers, MD for personal history of polyps was notable only for small internal hemorrhoids.  Five-year follow-up planned.  Urine culture of July 28, 2018: Klebsiella; 50,000-100,000. Sensitive to all except ampicillin and nitrofurantoin.  Urology notes from July 28, 2018 reviewed.  Status post laser enucleation of the prostate in August 2019.  The patient underwent abdominal exploration in April 04, 2015 for small bowel obstruction with identification of fibrous adhesions managed with a focal small bowel resection and incidental appendectomy.  Assessment    Ventral hernias along the midline wound post exploration for small bowel obstruction in June 2018.    Plan  I  Patient like to defer surgery until January as he is helping his son move ndications for surgical intervention are reviewed.  Considering the multiple defects he will benefit from a prosthetic mesh repair.  This will likely be in the retrorectus space although the possibility of a formal component separation involving the transversus abdominis muscle was discussed.  The patient wants to defer surgery until January 2020 as his son and daughter-in-law are are moving out of their home.  He was encouraged to help with packing but not with moving.   Will likely ask anesthesia to place an abdominal wall block after surgery to minimize postoperative pain.  HPI, Physical Exam, Assessment and Plan have been scribed under the direction and in the presence of Hervey Ard, MD.  Gaspar Cola, CMA  I have completed the exam and reviewed  the above documentation for accuracy and completeness.  I agree with the above.  Haematologist has been used and any errors in dictation or transcription are unintentional.  Hervey Ard, M.D., F.A.C.S.  Forest Gleason Monta Maiorana 10/09/2018, 10:01 AM  Patient's surgery has been scheduled for 11-15-2018 at [facility] with Dr. Bary Castilla. Arvilla Meres, RN will be assisting with this case. It is okay for patient to continue an 81 mg aspirin once daily.   The patient is aware he will be contacted by the [facility] to complete a phone interview sometime in the near future.  The patient is aware to call the office should he have further questions.   Dominga Ferry, CMA

## 2018-10-09 ENCOUNTER — Other Ambulatory Visit: Payer: Self-pay | Admitting: General Surgery

## 2018-10-09 DIAGNOSIS — K439 Ventral hernia without obstruction or gangrene: Secondary | ICD-10-CM

## 2018-10-29 ENCOUNTER — Ambulatory Visit: Payer: Medicare Other

## 2018-10-29 VITALS — BP 156/80 | HR 74

## 2018-10-29 DIAGNOSIS — Z5181 Encounter for therapeutic drug level monitoring: Secondary | ICD-10-CM

## 2018-10-29 DIAGNOSIS — I10 Essential (primary) hypertension: Secondary | ICD-10-CM | POA: Diagnosis not present

## 2018-10-29 LAB — BASIC METABOLIC PANEL WITH GFR
BUN: 16 mg/dL (ref 7–25)
CALCIUM: 9.2 mg/dL (ref 8.6–10.3)
CO2: 31 mmol/L (ref 20–32)
CREATININE: 0.94 mg/dL (ref 0.70–1.18)
Chloride: 102 mmol/L (ref 98–110)
GFR, Est African American: 94 mL/min/{1.73_m2} (ref >=60)
GFR, Est Non African American: 81 mL/min/{1.73_m2} (ref >=60)
GLUCOSE: 115 mg/dL — AB (ref 65–99)
Potassium: 3.6 mmol/L (ref 3.5–5.3)
Sodium: 138 mmol/L (ref 135–146)

## 2018-10-29 NOTE — Progress Notes (Signed)
Patient  Here for bp check and BMP.  Patient's previous med was on recall, so was started on new med Diovan.  Patient denies any side effects.  Bp today is 156/80 and pulse 74.  He has been doing readings at home which have been normal, but states he has white coat and at doctors visit it is elevated.  I consulted with Dr. Sanda Klein and we will continue current regimen

## 2018-11-02 ENCOUNTER — Ambulatory Visit (INDEPENDENT_AMBULATORY_CARE_PROVIDER_SITE_OTHER): Payer: Medicare Other | Admitting: Urology

## 2018-11-02 ENCOUNTER — Encounter: Payer: Self-pay | Admitting: Urology

## 2018-11-02 ENCOUNTER — Other Ambulatory Visit: Payer: Self-pay | Admitting: Family Medicine

## 2018-11-02 VITALS — BP 159/70 | HR 66 | Ht 73.0 in | Wt 235.0 lb

## 2018-11-02 DIAGNOSIS — N138 Other obstructive and reflux uropathy: Secondary | ICD-10-CM

## 2018-11-02 DIAGNOSIS — N401 Enlarged prostate with lower urinary tract symptoms: Secondary | ICD-10-CM | POA: Diagnosis not present

## 2018-11-02 LAB — BLADDER SCAN AMB NON-IMAGING

## 2018-11-02 MED ORDER — VALSARTAN-HYDROCHLOROTHIAZIDE 160-25 MG PO TABS: 1.0000 | ORAL_TABLET | Freq: Every day | ORAL | 5 refills | Status: DC

## 2018-11-02 NOTE — Telephone Encounter (Signed)
Copied from [employer] [id number]. Topic: Quick Communication - Rx Refill/Question >> Nov 02, 2018 11:09 AM Yvette Rack wrote: Medication:    valsartan-hydrochlorothiazide (DIOVAN HCT) 160-25 MG tablet    Has the patient contacted their pharmacy? No. (Agent: If no, request that the patient contact the pharmacy for the refill.) (Agent: If yes, when and what did the pharmacy advise?) pt was told by provider when he was in the office that if labs are good that she would refill this medicine   Preferred Pharmacy (with phone number or street name):   [facility] [address], [place] - [place] [phone] (Phone) [phone] (Fax)    Agent: Please be advised that RX refills may take up to 3 business days. We ask that you follow-up with your pharmacy.

## 2018-11-02 NOTE — Progress Notes (Signed)
11/02/2018  12:48 PM   Drew Mcmahon 04/06/1946 [id number]  Referring provider: Arnetha Courser, MD [address] [place], [place] [address]  Chief Complaint  Patient presents with  . Benign Prostatic Hypertrophy    3 month follow up    HPI: Drew Mcmahon is a 73 y.o. male with history of BPH with outlet obstruction and refractory obstructive urinary symptoms s/p homium laser enucleation of the prostate on 06/14/18. He presents today to follow up with his symptoms.  Surgical pathology consistent with benign hyperplasia. 41 g of tissue were resected Preop TRUS volume 57 g with a median lobe, small.  He admits today he is doing well and continues to have a good stream. He feels his urgency and frequency have improved. Denies gross hematuria. He admits to occasional leakage that is causing him to wear protection daily. He experiences the leakage when he bends over, but these occurrences are decreasing. He admits to doing kegels when he remembers.  He is no longer on pharmalogical intervention for his urinary symptoms.   Bladder emptying excellent today, PVR below.  Overall, he is very pleased that he had surgery.  IPSS    Row Name 11/02/18 0900         International Prostate Symptom Score   How often have you had the sensation of not emptying your bladder?  Less than 1 in 5     How often have you had to urinate less than every two hours?  About half the time     How often have you found you stopped and started again several times when you urinated?  About half the time     How often have you found it difficult to postpone urination?  Less than 1 in 5 times     How often have you had a weak urinary stream?  Not at All     How often have you had to strain to start urination?  Not at All     How many times did you typically get up at night to urinate?  1 Time     Total IPSS Score  9       Quality of Life due to urinary symptoms   If you were to spend the rest of your  life with your urinary condition just the way it is now how would you feel about that?  Pleased        PMH: Past Medical History:  Diagnosis Date  . Elevated lipids   . Hip fracture ([place]) 2007  . Hyperlipidemia   . Hypertension   . Osteopenia 07/19/2018   Sept 2019; next scan Sept 2021    Surgical History: Past Surgical History:  Procedure Laterality Date  . APPENDECTOMY  04/03/2017   Incidental APPENDECTOMY;  Surgeon: [name], MD;  Location: [facility] ORS;  Service: General;;  . BOWEL RESECTION  04/03/2017   Procedure: FOCAL SMALL BOWEL RESECTION;  Surgeon: [name], MD;  Location: [facility] ORS;  Service: General;;  . COLONOSCOPY WITH PROPOFOL N/A 06/12/2016   Procedure: COLONOSCOPY WITH PROPOFOL;  Surgeon: Manya Silvas, MD;  Location: [facility] ENDOSCOPY;  Service: Endoscopy;  Laterality: N/A;  . FRACTURE SURGERY Left 2007   hip  . HOLEP-LASER ENUCLEATION OF THE PROSTATE WITH MORCELLATION N/A 06/14/2018   Procedure: HOLEP-LASER ENUCLEATION OF THE PROSTATE WITH MORCELLATION;  Surgeon: Hollice Espy, MD;  Location: [facility] ORS;  Service: Urology;  Laterality: N/A;  . LAPAROSCOPY N/A 04/01/2017  Procedure: LAPAROSCOPY DIAGNOSTIC;  Surgeon: [name], MD;  Location:  ORS;  Service: General;  Laterality: N/A;  . LAPAROTOMY N/A 04/03/2017   Procedure: EXPLORATORY LAPAROTOMY;  Surgeon: [name], MD;  Location: [facility] ORS;  Service: General;  Laterality: N/A;  . LYSIS OF ADHESION  04/03/2017   Procedure: LYSIS OF ADHESION;  Surgeon: [name], MD;  Location: [facility] ORS;  Service: General;;    Home Medications:  Allergies as of 11/02/2018   No Known Allergies     Medication List       Accurate as of November 02, 2018 12:48 PM. Always use your most recent med list.        aspirin EC 81 MG EC tablet Generic drug:  aspirin Take 81 mg by mouth daily. Swallow whole.   atorvastatin 20 MG tablet Commonly known as:  LIPITOR Take 1 tablet (20 mg total) by  mouth at bedtime.   metoprolol succinate 50 MG 24 hr tablet Commonly known as:  TOPROL-XL TAKE 1 TABLET BY MOUTH ONCE DAILY TAKE  WITH  OR  IMMEDIATELY  FOLLOWING  A  MEAL   ONE-A-DAY MENS VITACRAVES PO Take 1 tablet by mouth daily.   triamcinolone cream 0.1 % Commonly known as:  KENALOG Apply 1 application topically daily as needed (for skin irritation).   valsartan-hydrochlorothiazide 160-25 MG tablet Commonly known as:  DIOVAN HCT Take 1 tablet by mouth daily. For blood pressure; this replaces losartan-hctz       Allergies: No Known Allergies  Family History: Family History  Problem Relation Age of Onset  . Hypertension Mother   . Alzheimer's disease Mother   . Hypertension Father   . Cancer Father        lung  . Hypertension Sister   . Hypertension Brother   . Hypertension Sister   . Hypertension Sister   . Hypertension Brother   . Prostate cancer Neg Hx   . Kidney cancer Neg Hx     Social History:  reports that he quit smoking about 33 years ago. His smoking use included cigarettes. He has a 5.00 pack-year smoking history. He has never used smokeless tobacco. He reports that he does not drink alcohol or use drugs.  ROS: UROLOGY Frequent Urination?: No Hard to postpone urination?: No Burning/pain with urination?: No Get up at night to urinate?: No Leakage of urine?: No Urine stream starts and stops?: No Trouble starting stream?: No Do you have to strain to urinate?: No Blood in urine?: No Urinary tract infection?: No Sexually transmitted disease?: No Injury to kidneys or bladder?: No Painful intercourse?: No Weak stream?: No Erection problems?: No Penile pain?: No  Gastrointestinal Nausea?: No Vomiting?: No Indigestion/heartburn?: No Diarrhea?: No Constipation?: No  Constitutional Fever: No Night sweats?: No Weight loss?: No Fatigue?: No  Skin Skin rash/lesions?: No Itching?: No  Eyes Blurred vision?: No Double vision?:  No  Ears/Nose/Throat Sore throat?: No Sinus problems?: No  Hematologic/Lymphatic Swollen glands?: No Easy bruising?: No  Cardiovascular Leg swelling?: No Chest pain?: No  Respiratory Cough?: No Shortness of breath?: No  Endocrine Excessive thirst?: No  Musculoskeletal Back pain?: No Joint pain?: No  Neurological Headaches?: No Dizziness?: No  Psychologic Depression?: No Anxiety?: No  Physical Exam: BP (!) 159/70   Pulse 66   Ht 6\' 1"  (1.854 m)   Wt 235 lb (106.6 kg)   BMI 31.00 kg/m   Constitutional:  Alert and oriented, No acute distress. Respiratory: Normal respiratory effort, no increased work  of breathing. GU: No CVA tenderness Skin: No rashes, bruises or suspicious lesions. Neurologic: Grossly intact, no focal deficits, moving all 4 extremities. Psychiatric: Normal mood and affect.  Imaging Results for orders placed or performed in visit on 11/02/18  BLADDER SCAN AMB NON-IMAGING  Result Value Ref Range   Scan Result 14ml     Assessment & Plan:    1. Stress incontinence in male Improving, minimal, wearing a safety pad Should continue to improve and ultimately resolve We discussed that his symptoms will continue to improve and he should continue kegels  2. BPH with bladder obstruction Doing well s/p holium with symptom improvement Adequate bladder emptying today No longer on BPH medication RTC in one year for IPSS, PVR, and PSA for new baseline   Return in about 1 year (around 11/03/2019) for  IPSS, PVR, PSA and symptom recheck.  Hollice Espy, MD   [facility] [address] [place],  [address] [phone]  I, Stephania Fragmin , am acting as a scribe for Hollice Espy, MD  I have reviewed the above documentation for accuracy and completeness, and I agree with the above.   Hollice Espy, MD

## 2018-11-02 NOTE — Telephone Encounter (Signed)
Rx sent. Thank you.

## 2018-11-03 DIAGNOSIS — H04123 Dry eye syndrome of bilateral lacrimal glands: Secondary | ICD-10-CM | POA: Diagnosis not present

## 2018-11-04 ENCOUNTER — Other Ambulatory Visit: Payer: Medicare Other

## 2018-11-04 ENCOUNTER — Other Ambulatory Visit: Payer: Self-pay

## 2018-11-04 ENCOUNTER — Encounter
Admission: RE | Admit: 2018-11-04 | Discharge: 2018-11-04 | Disposition: A | Discharge status: Home / Self Care | Payer: Medicare Other | Source: Ambulatory Visit | Attending: General Surgery | Admitting: General Surgery

## 2018-11-04 NOTE — Patient Instructions (Signed)
Your procedure is scheduled on: 11/15/18 mon Report to Same Day Surgery 2nd floor medical mall [facility] Entrance-take elevator on left to 2nd floor.  Check in with surgery information desk.) To find out your arrival time please call [phone] between 1PM - 3PM on 11/12/18 Fri  Remember: Instructions that are not followed completely may result in serious medical risk, up to and including death, or upon the discretion of your surgeon and anesthesiologist your surgery may need to be rescheduled.    _x___ 1. Do not eat food after midnight the night before your procedure. You may drink clear liquids up to 2 hours before you are scheduled to arrive at the hospital for your procedure.  Do not drink clear liquids within 2 hours of your scheduled arrival to the hospital.  Clear liquids include  --Water or Apple juice without pulp  --Clear carbohydrate beverage such as ClearFast or Gatorade  --Black Coffee or Clear Tea (No milk, no creamers, do not add anything to                  the coffee or Tea Type 1 and type 2 diabetics should only drink water.   ____Ensure clear carbohydrate drink on the way to the hospital for bariatric patients  ____Ensure clear carbohydrate drink 3 hours before surgery for Dr Dwyane Luo patients if physician instructed.   No gum chewing or hard candies.     __x__ 2. No Alcohol for 24 hours before or after surgery.   __x__3. No Smoking or e-cigarettes for 24 prior to surgery.  Do not use any chewable tobacco products for at least 6 hour prior to surgery   ____  4. Bring all medications with you on the day of surgery if instructed.    __x__ 5. Notify your doctor if there is any change in your medical condition     (cold, fever, infections).    x___6. On the morning of surgery brush your teeth with toothpaste and water.  You may rinse your mouth with mouth wash if you wish.  Do not swallow any toothpaste or mouthwash.   Do not wear jewelry, make-up, hairpins,  clips or nail polish.  Do not wear lotions, powders, or perfumes. You may wear deodorant.  Do not shave 48 hours prior to surgery. Men may shave face and neck.  Do not bring valuables to the hospital.    [facility] is not responsible for any belongings or valuables.               Contacts, dentures or bridgework may not be worn into surgery.  Leave your suitcase in the car. After surgery it may be brought to your room.  For patients admitted to the hospital, discharge time is determined by your                       treatment team.  _  Patients discharged the day of surgery will not be allowed to drive home.  You will need someone to drive you home and stay with you the night of your procedure.    Please read over the following fact sheets that you were given:   [facility] Preparing for Surgery and or MRSA Information   _x___ Take anti-hypertensive listed below, cardiac, seizure, asthma,     anti-reflux and psychiatric medicines. These include:  1. metoprolol succinate (TOPROL-XL) 50 MG 24 hr tablet  2.  3.  4.  5.  6.  ____Fleets enema or Magnesium Citrate as directed.   _x___ Use CHG Soap or sage wipes as directed on instruction sheet   ____ Use inhalers on the day of surgery and bring to hospital day of surgery  ____ Stop Metformin and Janumet 2 days prior to surgery.    ____ Take 1/2 of usual insulin dose the night before surgery and none on the morning     surgery.   _x___ Follow recommendations from Cardiologist, Pulmonologist or PCP regarding          stopping Aspirin, Coumadin, Plavix ,Eliquis, Effient, or Pradaxa, and Pletal.  X____Stop Anti-inflammatories such as Advil, Aleve, Ibuprofen, Motrin, Naproxen, Naprosyn, Goodies powders or aspirin products. OK to take Tylenol and                          Celebrex.   _x___ Stop supplements until after surgery.  But may continue Vitamin D, Vitamin B,       and multivitamin.   ____ Bring C-Pap to the hospital.

## 2018-11-05 ENCOUNTER — Encounter
Admission: RE | Admit: 2018-11-05 | Discharge: 2018-11-05 | Disposition: A | Discharge status: Home / Self Care | Payer: Medicare Other | Source: Ambulatory Visit | Attending: General Surgery | Admitting: General Surgery

## 2018-11-05 DIAGNOSIS — Z01812 Encounter for preprocedural laboratory examination: Secondary | ICD-10-CM | POA: Diagnosis not present

## 2018-11-05 LAB — CBC
HCT: 43.6 % (ref 39.0–52.0)
Hemoglobin: 14.7 g/dL (ref 13.0–17.0)
MCH: 31.4 pg (ref 26.0–34.0)
MCHC: 33.7 g/dL (ref 30.0–36.0)
MCV: 93.2 fL (ref 80.0–100.0)
PLATELETS: 161 10*3/uL (ref 150–400)
RBC: 4.68 MIL/uL (ref 4.22–5.81)
RDW: 12.5 % (ref 11.5–15.5)
WBC: 7.2 10*3/uL (ref 4.0–10.5)
nRBC: 0 % (ref 0.0–0.2)

## 2018-11-08 ENCOUNTER — Telehealth: Payer: Self-pay

## 2018-11-08 NOTE — Telephone Encounter (Signed)
Call to patient to see about rescheduling his surgery scheduled for 11/15/18 with Dr Bary Castilla as he will be on extended leave. I offered the patient the option of having his surgery completed with another provider and he said that he would only want this surgery done by Dr Bary Castilla. I did let him know that we were not sure when he would be returning. He was fine with waiting until he returned. We will call the patient once we have his return schedule to reschedule his surgery and have him come in for a pre op prior to surgery.

## 2018-11-15 ENCOUNTER — Inpatient Hospital Stay: Admission: RE | Admit: 2018-11-15 | Payer: Medicare Other | Source: Home / Self Care | Admitting: General Surgery

## 2018-11-15 ENCOUNTER — Encounter: Admission: RE | Payer: Self-pay | Source: Home / Self Care

## 2018-11-15 SURGERY — REPAIR, HERNIA, VENTRAL
Anesthesia: General

## 2018-11-22 ENCOUNTER — Telehealth: Payer: Self-pay

## 2018-11-22 NOTE — Telephone Encounter (Signed)
Call to patient to let him know that Dr Bary Castilla is back from leave and that we can reschedule his surgery.  The patient is scheduled for surgery at [facility] with Dr Bary Castilla on 12/13/18. He will see Dr Bary Castilla for a pre op visit on 11/30/18 at 1:45 pm. He will pre admit by phone. Arvilla Meres, RN will be assisting with this case.

## 2018-11-29 ENCOUNTER — Other Ambulatory Visit: Payer: Self-pay | Admitting: Radiology

## 2018-11-29 DIAGNOSIS — R972 Elevated prostate specific antigen [PSA]: Secondary | ICD-10-CM

## 2018-11-30 ENCOUNTER — Ambulatory Visit (INDEPENDENT_AMBULATORY_CARE_PROVIDER_SITE_OTHER): Payer: Medicare Other | Admitting: General Surgery

## 2018-11-30 ENCOUNTER — Encounter: Payer: Self-pay | Admitting: General Surgery

## 2018-11-30 ENCOUNTER — Other Ambulatory Visit: Payer: Self-pay

## 2018-11-30 VITALS — BP 158/81 | HR 55 | Temp 97.5°F | Ht 73.0 in | Wt 235.0 lb

## 2018-11-30 DIAGNOSIS — K439 Ventral hernia without obstruction or gangrene: Secondary | ICD-10-CM | POA: Diagnosis not present

## 2018-11-30 NOTE — Progress Notes (Signed)
Patient ID: Drew Mcmahon, male   DOB: January 15, 1946, 73 y.o.   MRN: [id number]  Chief Complaint  Patient presents with  . Pre-op Exam    ventral hernia     HPI Drew Mcmahon is a 73 y.o. male here today for his pre op ventral hernia repair scheduled on 12/13/2018.  The patient is done well since his last visit.  Significant family stress with his son going through divorce.  No difficulty with bowel or bladder function. HPI  Past Medical History:  Diagnosis Date  . Elevated lipids   . Hip fracture ([place]) 2007  . Hyperlipidemia   . Hypertension   . Osteopenia 07/19/2018   Sept 2019; next scan Sept 2021    Past Surgical History:  Procedure Laterality Date  . APPENDECTOMY  04/03/2017   Incidental APPENDECTOMY;  Surgeon: [name], MD;  Location: [facility] ORS;  Service: General;;  . BOWEL RESECTION  04/03/2017   Procedure: FOCAL SMALL BOWEL RESECTION;  Surgeon: [name], MD;  Location: [facility] ORS;  Service: General;;  . COLONOSCOPY WITH PROPOFOL N/A 06/12/2016   Procedure: COLONOSCOPY WITH PROPOFOL;  Surgeon: Manya Silvas, MD;  Location: [facility] ENDOSCOPY;  Service: Endoscopy;  Laterality: N/A;  . FRACTURE SURGERY Left 2007   hip  . HOLEP-LASER ENUCLEATION OF THE PROSTATE WITH MORCELLATION N/A 06/14/2018   Procedure: HOLEP-LASER ENUCLEATION OF THE PROSTATE WITH MORCELLATION;  Surgeon: Hollice Espy, MD;  Location: [facility] ORS;  Service: Urology;  Laterality: N/A;  . LAPAROSCOPY N/A 04/01/2017   Procedure: LAPAROSCOPY DIAGNOSTIC;  Surgeon: [name], MD;  Location: [place] ORS;  Service: General;  Laterality: N/A;  . LAPAROTOMY N/A 04/03/2017   Procedure: EXPLORATORY LAPAROTOMY;  Surgeon: [name], MD;  Location: [facility] ORS;  Service: General;  Laterality: N/A;  . LYSIS OF ADHESION  04/03/2017   Procedure: LYSIS OF ADHESION;  Surgeon: [name], MD;  Location: [facility] ORS;  Service: General;;    Family History  Problem Relation Age of Onset  . Hypertension  Mother   . Alzheimer's disease Mother   . Hypertension Father   . Cancer Father        lung  . Hypertension Sister   . Hypertension Brother   . Hypertension Sister   . Hypertension Sister   . Hypertension Brother   . Prostate cancer Neg Hx   . Kidney cancer Neg Hx     Social History Social History   Tobacco Use  . Smoking status: Former Smoker    Packs/day: 0.50    Years: 10.00    Pack years: 5.00    Types: Cigarettes    Last attempt to quit: 08/26/1985    Years since quitting: 33.2  . Smokeless tobacco: Never Used  . Tobacco comment: smoking cessation materials not required  Substance Use Topics  . Alcohol use: No  . Drug use: No    No Known Allergies  Current Outpatient Medications  Medication Sig Dispense Refill  . aspirin (ASPIRIN EC) 81 MG EC tablet Take 81 mg by mouth daily. Swallow whole.    . Multiple Vitamins-Minerals (ONE-A-DAY MENS VITACRAVES PO) Take 1 tablet by mouth daily.     Marland Kitchen triamcinolone cream (KENALOG) 0.1 % Apply 1 application topically daily as needed (for skin irritation).     Marland Kitchen atorvastatin (LIPITOR) 20 MG tablet Take 1 tablet (20 mg total) by mouth at bedtime. 90 tablet 3  . metoprolol succinate (TOPROL-XL) 50 MG 24 hr tablet TAKE 1 TABLET BY  MOUTH ONCE DAILY TAKE  WITH  OR  IMMEDIATELY  FOLLOWING  A  MEAL 90 tablet 3  . valsartan-hydrochlorothiazide (DIOVAN HCT) 160-25 MG tablet Take 1 tablet by mouth daily. For blood pressure; this replaces losartan-hctz 90 tablet 3   No current facility-administered medications for this visit.     Review of Systems Review of Systems  Blood pressure (!) 158/81, pulse (!) 55, temperature (!) 97.5 F (36.4 C), temperature source Skin, height 6\' 1"  (1.854 m), weight 235 lb (106.6 kg), SpO2 97 %.  Physical Exam Physical Exam Constitutional:      Appearance: Normal appearance. He is well-developed.  Eyes:     General: No scleral icterus.    Conjunctiva/sclera: Conjunctivae normal.  Cardiovascular:      Rate and Rhythm: Normal rate and regular rhythm.     Heart sounds: Normal heart sounds.  Pulmonary:     Effort: Pulmonary effort is normal.     Breath sounds: Normal breath sounds.  Abdominal:     General: Bowel sounds are normal.     Palpations: Abdomen is soft. There is no hepatomegaly.     Tenderness: There is no abdominal tenderness.    Lymphadenopathy:     Cervical: No cervical adenopathy.  Skin:    General: Skin is warm and dry.  Neurological:     Mental Status: He is alert and oriented to person, place, and time.     Data Reviewed CBC of November 05, 2018 showed hemoglobin of 14.7 with an MCV of 93, white blood cell count 7200.  Platelet count of 161,000. Basic metabolic panel October 29, 2018 showed normal renal function with a creatinine of 0.9 and estimated GFR of 81.  Normal electrolytes.  Wound culture of April 13, 2017 showed polymorphonuclear cells, culture showed rare Staphylococcus, coagulase-negative.    Surgical specimen of April 03, 2017 for persistent small bowel obstruction: A. SMALL BOWEL; RESECTION:  - SEGMENT OF SMALL INTESTINE WITH CONGESTION, FOCAL ACUTE SEROSITIS WITH  FIBROUS ADHESIONS AND AREA OF SUBMUCOSAL HEMATOMA.  - VIABLE MARGINS OF RESECTION.   B. APPENDIX; INCIDENTAL APPENDECTOMY:  - APPENDIX WITH FIBROUS OBLITERATION OF THE DISTAL LUMEN.  - NEGATIVE FOR MALIGNANCY.   Assessment    Ventral hernia, no symptoms of intestinal obstruction.    Plan Pros and cons of elective repair were reviewed.  He will certainly need mesh reinforcement.  Whether this will simply be retrorectus placement is unclear.  He may require full component separation.  Plans for hospital stay of 3-4 days reviewed.  Importance of early ambulation reviewed.  Hernia precautions and incarceration were discussed with the patient. If they develop symptoms of an incarcerated hernia, they were encouraged to seek prompt medical attention.  I have recommended repair of the  hernia using mesh on an outpatient basis in the near future. The risk of infection was reviewed. The role of prosthetic mesh to minimize the risk of recurrence was reviewed.  HPI, Physical Exam, Assessment and Plan have been scribed under the direction and in the presence of Hervey Ard, MD.  Gaspar Cola, CMA  I have completed the exam and reviewed the above documentation for accuracy and completeness.  I agree with the above.  Haematologist has been used and any errors in dictation or transcription are unintentional.  Hervey Ard, M.D., F.A.C.S.  Forest Gleason Milca Sytsma 12/01/2018, 2:24 PM

## 2018-11-30 NOTE — Patient Instructions (Signed)
Laparoscopic Ventral Hernia Repair Laparoscopic ventral hernia repairis a procedure to fix a bulge of tissue that pushes through a weak area of muscle in the abdomen (ventral hernia). A ventral hernia may be at the belly button (umbilical), above the belly button (epigastric), or at the incision site from previous abdominal surgery (incisional hernia). You may have this procedure as emergency surgery if part of your intestine gets trapped inside the hernia and starts to lose its blood supply (strangulation). Laparoscopic surgery is done through small incisions using a thin surgical telescope with a light and camera on the end (laparoscope). During surgery, your surgeon will use images from the laparoscope to guide the procedure. A mesh screen will be placed in the hernia to close the opening and strengthen the abdominal wall. Tell a health care provider about:  Any allergies you have.  All medicines you are taking, including vitamins, herbs, eye drops, creams, and over-the-counter medicines.  Any problems you or family members have had with anesthetic medicines.  Any blood disorders you have.  Any surgeries you have had.  Any medical conditions you have.  Whether you are pregnant or may be pregnant. What are the risks? Generally, this is a safe procedure. However, problems may occur, including:  Infection.  Bleeding.  Allergic reactions to medicines.  Damage to other structures or organs in the abdomen.  Trouble urinating or having a bowel movement after surgery.  Pneumonia.  Blood clots.  The hernia coming back after surgery.  Fluid buildup in the area of the hernia. In some cases, your health care provider may need to switch from a laparoscopic procedure to a procedure that is done through a single, larger incision in the abdomen (open procedure). You may need an open procedure if:  You have a hernia that is difficult to repair.  Your organs are hard to see.  You have  bleeding problems during the laparoscopic procedure. What happens before the procedure? Staying hydrated Follow instructions from your health care provider about hydration, which may include:  Up to 2 hours before the procedure - you may continue to drink clear liquids, such as water, clear fruit juice, black coffee, and plain tea. Eating and drinking restrictions Follow instructions from your health care provider about eating and drinking, which may include:  8 hours before the procedure - stop eating heavy meals or foods such as meat, fried foods, or fatty foods.  6 hours before the procedure - stop eating light meals or foods, such as toast or cereal.  6 hours before the procedure - stop drinking milk or drinks that contain milk.  2 hours before the procedure - stop drinking clear liquids. Medicines  Ask your health care provider about: ? Changing or stopping your regular medicines. This is especially important if you are taking diabetes medicines or blood thinners. ? Taking medicines such as aspirin and ibuprofen. These medicines can thin your blood. Do not take these medicines before your procedure if your health care provider instructs you not to.  You may be given antibiotic medicine to help prevent infection. General instructions   You may be asked to take a laxative or do an enema to empty your bowel before surgery (bowel prep).  Do not use any products that contain nicotine or tobacco, such as cigarettes and e-cigarettes. If you need help quitting, ask your health care provider.  You may need to have tests before the procedure, such as: ? Blood tests. ? Urine tests. ? Abdominal ultrasound. ?  Chest X-ray. ? Electrocardiogram (ECG).  Plan to have someone take you home from the hospital or clinic.  If you will be going home right after the procedure, plan to have someone with you for 24 hours. What happens during the procedure?  To reduce your risk of  infection: ? Your health care team will wash or sanitize their hands. ? Your skin will be washed with soap.  An IV tube will be inserted into one of your veins.  You will be given one or more of the following: ? A medicine to help you relax (sedative). ? A medicine to make you fall asleep (general anesthetic).  A small incision will be made in your abdomen. A hollow metal tube (trocar) will be placed through the incision.  A tube will be placed through the trocar to inflate your abdomen with air-like gas. This makes it easier for your surgeon to see inside your abdomen and do the repair.  The laparoscope will be inserted into your abdomen through the trocar. The laparoscope will send images to a monitor in the operating room.  Other trocars will be put through other small incisions in your abdomen. The surgical instruments needed for the procedure will be placed through these trocars.  The tissue or intestines that make up the hernia will be moved back into place.  The edges of the hernia may be stitched together.  A piece of mesh will be used to close the hernia. Stitches (sutures), clips, or staples will be used to keep the mesh in place.  A bandage (dressing) or skin glue will be put over the incisions. The procedure may vary among health care providers and hospitals. What happens after the procedure?  Your blood pressure, heart rate, breathing rate, and blood oxygen level will be monitored until the medicines you were given have worn off.  You will continue to receive fluids and medicines through an IV tube. Your IV tube will be removed when you can drink clear fluids.  You will be given pain medicine as needed.  You will be encouraged to get up and walk around as soon as possible.  You may have to wear compression stockings. These stockings help to prevent blood clots and reduce swelling in your legs.  You will be shown how to do deep breathing exercises to help prevent a  lung infection.  Do not drive for 24 hours if you were given a sedative. This information is not intended to replace advice given to you by your health care provider. Make sure you discuss any questions you have with your health care provider. Document Released: 09/29/2012 Document Revised: 05/30/2016 Document Reviewed: 05/30/2016 Elsevier Interactive Patient Education  2019 [employer].

## 2018-12-01 ENCOUNTER — Encounter: Payer: Self-pay | Admitting: Family Medicine

## 2018-12-01 ENCOUNTER — Ambulatory Visit (INDEPENDENT_AMBULATORY_CARE_PROVIDER_SITE_OTHER): Payer: Medicare Other | Admitting: Family Medicine

## 2018-12-01 VITALS — BP 130/78 | HR 62 | Temp 97.6°F | Ht 73.0 in | Wt 234.2 lb

## 2018-12-01 DIAGNOSIS — E785 Hyperlipidemia, unspecified: Secondary | ICD-10-CM

## 2018-12-01 DIAGNOSIS — Z5181 Encounter for therapeutic drug level monitoring: Secondary | ICD-10-CM | POA: Diagnosis not present

## 2018-12-01 DIAGNOSIS — R739 Hyperglycemia, unspecified: Secondary | ICD-10-CM | POA: Diagnosis not present

## 2018-12-01 DIAGNOSIS — I700 Atherosclerosis of aorta: Secondary | ICD-10-CM | POA: Diagnosis not present

## 2018-12-01 DIAGNOSIS — E669 Obesity, unspecified: Secondary | ICD-10-CM

## 2018-12-01 DIAGNOSIS — I10 Essential (primary) hypertension: Secondary | ICD-10-CM

## 2018-12-01 MED ORDER — ATORVASTATIN CALCIUM 20 MG PO TABS: 20.0000 mg | ORAL_TABLET | Freq: Every day | ORAL | 3 refills | Status: DC

## 2018-12-01 MED ORDER — VALSARTAN-HYDROCHLOROTHIAZIDE 160-25 MG PO TABS: 1.0000 | ORAL_TABLET | Freq: Every day | ORAL | 3 refills | Status: DC

## 2018-12-01 MED ORDER — METOPROLOL SUCCINATE ER 50 MG PO TB24: ORAL_TABLET | ORAL | 3 refills | Status: DC

## 2018-12-01 NOTE — Assessment & Plan Note (Signed)
Check lipids, goal LDL less than 70

## 2018-12-01 NOTE — Progress Notes (Signed)
BP 130/78   Pulse 62   Temp 97.6 F (36.4 C)   Ht 6\' 1"  (1.854 m)   Wt 234 lb 3.2 oz (106.2 kg)   SpO2 98%   BMI 30.90 kg/m    Subjective:    Patient ID: Drew Mcmahon, male    DOB: May 24, 1946, 73 y.o.   MRN: [id number]  HPI: Drew Mcmahon is a 73 y.o. male  Chief Complaint  Patient presents with  . Follow-up    HPI Here for f/u  He is going to have surgery later this month, Feb 17th; abdominal hernia repair with mesh planned; just uncomfortable, no real pain, just aggravating; no blood in the stool; no vomiting  HTN; checks BP frequently away from the doctor, this morning it was 120/72, 114 a few minutes later; 142/76 after showering Two or three times a week Trying to cut down on his salt intake Trying to follow DASH guidelines  Aortic calcification; not sure about family hx; cannot do planks with abdominal hernia Not many fatty pig meat; no hot dogs; occasional cheese dog; sausage or bacon once a week; tries to eat oatmeal every other day; came fasting  Lab Results  Component Value Date   CHOL 215 (H) 05/31/2018   HDL 58 05/31/2018   LDLCALC 131 (H) 05/31/2018   TRIG 144 05/31/2018   CHOLHDL 3.7 05/31/2018   Hyperglycemia; no dry mouth or blurred vision; avoiding sweets  Dr. Erlene Quan is managing his prostate; urinating fine  Obesity; he and his wife are going to work on this  Depression screen [facility] 2/9 12/01/2018 06/29/2018 05/31/2018 12/01/2016 08/27/2015  Decreased Interest 0 0 0 0 0  Down, Depressed, Hopeless 0 0 0 0 0  PHQ - 2 Score 0 0 0 0 0  Altered sleeping 0 0 - - -  Tired, decreased energy 0 0 - - -  Change in appetite 0 0 - - -  Feeling bad or failure about yourself  0 0 - - -  Trouble concentrating 0 0 - - -  Moving slowly or fidgety/restless 0 0 - - -  Suicidal thoughts 0 0 - - -  PHQ-9 Score 0 0 - - -  Difficult doing work/chores Not difficult at all Not difficult at all - - -   Fall Risk  12/01/2018 11/30/2018 06/29/2018 05/31/2018 03/30/2017  Falls in  the past year? 0 0 No No No  Number falls in past yr: - - - - -  Injury with Fall? - - - - -  Risk for fall due to : - - Impaired vision - -  Risk for fall due to: Comment - - wears eyeglasses - -    Relevant past medical, surgical, family and social history reviewed Past Medical History:  Diagnosis Date  . Elevated lipids   . Hip fracture ([place]) 2007  . Hyperlipidemia   . Hypertension   . Osteopenia 07/19/2018   Sept 2019; next scan Sept 2021   Past Surgical History:  Procedure Laterality Date  . APPENDECTOMY  04/03/2017   Incidental APPENDECTOMY;  Surgeon: [name], MD;  Location: [facility] ORS;  Service: General;;  . BOWEL RESECTION  04/03/2017   Procedure: FOCAL SMALL BOWEL RESECTION;  Surgeon: [name], MD;  Location: [facility] ORS;  Service: General;;  . COLONOSCOPY WITH PROPOFOL N/A 06/12/2016   Procedure: COLONOSCOPY WITH PROPOFOL;  Surgeon: Manya Silvas, MD;  Location[facility] ENDOSCOPY;  Service: Endoscopy;  Laterality: N/A;  . FRACTURE  SURGERY Left 2007   hip  . HOLEP-LASER ENUCLEATION OF THE PROSTATE WITH MORCELLATION N/A 06/14/2018   Procedure: HOLEP-LASER ENUCLEATION OF THE PROSTATE WITH MORCELLATION;  Surgeon: Hollice Espy, MD;  Location: [facility] ORS;  Service: Urology;  Laterality: N/A;  . LAPAROSCOPY N/A 04/01/2017   Procedure: LAPAROSCOPY DIAGNOSTIC;  Surgeon: [name], MD;  Location: [facility];  Service: General;  Laterality: N/A;  . LAPAROTOMY N/A 04/03/2017   Procedure: EXPLORATORY LAPAROTOMY;  Surgeon: [name], MD;  Location: [facility] ORS;  Service: General;  Laterality: N/A;  . LYSIS OF ADHESION  04/03/2017   Procedure: LYSIS OF ADHESION;  Surgeon: [name], MD;  Location: [facility] ORS;  Service: General;;   Family History  Problem Relation Age of Onset  . Hypertension Mother   . Alzheimer's disease Mother   . Hypertension Father   . Cancer Father        lung  . Hypertension Sister   . Hypertension Brother   . Hypertension Sister    . Hypertension Sister   . Hypertension Brother   . Prostate cancer Neg Hx   . Kidney cancer Neg Hx    Social History   Tobacco Use  . Smoking status: Former Smoker    Packs/day: 0.50    Years: 10.00    Pack years: 5.00    Types: Cigarettes    Last attempt to quit: 08/26/1985    Years since quitting: 33.2  . Smokeless tobacco: Never Used  . Tobacco comment: smoking cessation materials not required  Substance Use Topics  . Alcohol use: No  . Drug use: No     Office Visit from 12/01/2018 in [facility]  AUDIT-C Score  0      Interim medical history since last visit reviewed. Allergies and medications reviewed  Review of Systems Per HPI unless specifically indicated above     Objective:    BP 130/78   Pulse 62   Temp 97.6 F (36.4 C)   Ht 6\' 1"  (1.854 m)   Wt 234 lb 3.2 oz (106.2 kg)   SpO2 98%   BMI 30.90 kg/m   Wt Readings from Last 3 Encounters:  12/01/18 234 lb 3.2 oz (106.2 kg)  11/30/18 235 lb (106.6 kg)  11/04/18 235 lb (106.6 kg)    Physical Exam Constitutional:      General: He is not in acute distress.    Appearance: He is well-developed. He is obese.  HENT:     Head: Normocephalic and atraumatic.  Eyes:     General: No scleral icterus. Neck:     Thyroid: No thyromegaly.  Cardiovascular:     Rate and Rhythm: Normal rate and regular rhythm.  Pulmonary:     Effort: Pulmonary effort is normal.     Breath sounds: Normal breath sounds.  Abdominal:     General: Bowel sounds are normal. There is no distension.     Palpations: Abdomen is soft.     Tenderness: There is no abdominal tenderness.     Hernia: A hernia is present. Hernia is present in the ventral area.    Skin:    General: Skin is warm and dry.     Coloration: Skin is not pale.  Neurological:     Mental Status: He is alert.     Coordination: Coordination normal.  Psychiatric:        Behavior: Behavior normal.        Thought Content: Thought content normal.  Judgment: Judgment normal.     Results for orders placed or performed during the hospital encounter of 11/05/18  CBC  Result Value Ref Range   WBC 7.2 4.0 - 10.5 K/uL   RBC 4.68 4.22 - 5.81 MIL/uL   Hemoglobin 14.7 13.0 - 17.0 g/dL   HCT 43.6 39.0 - 52.0 %   MCV 93.2 80.0 - 100.0 fL   MCH 31.4 26.0 - 34.0 pg   MCHC 33.7 30.0 - 36.0 g/dL   RDW 12.5 11.5 - 15.5 %   Platelets 161 150 - 400 K/uL   nRBC 0.0 0.0 - 0.2 %      Assessment & Plan:   Problem List Items Addressed This Visit      Cardiovascular and Mediastinum   Hypertension - Primary (Chronic)    Try to follow DASH guidelines; controlled      Relevant Medications   atorvastatin (LIPITOR) 20 MG tablet   metoprolol succinate (TOPROL-XL) 50 MG 24 hr tablet   valsartan-hydrochlorothiazide (DIOVAN HCT) 160-25 MG tablet   Aortic calcification (HCC)    Goal LDL is less than 70; limit saturated fats; check lipids today      Relevant Medications   atorvastatin (LIPITOR) 20 MG tablet   metoprolol succinate (TOPROL-XL) 50 MG 24 hr tablet   valsartan-hydrochlorothiazide (DIOVAN HCT) 160-25 MG tablet   Other Relevant Orders   Lipid panel     Other   Medication monitoring encounter   Relevant Orders   Hepatic function panel   Obesity (BMI 30.0-34.9)    Encouraged weight loss; he would like to get down to 200 pounds, aim for that over the next year      Hyperlipidemia (Chronic)    Check lipids, goal LDL less than 70      Relevant Medications   atorvastatin (LIPITOR) 20 MG tablet   metoprolol succinate (TOPROL-XL) 50 MG 24 hr tablet   valsartan-hydrochlorothiazide (DIOVAN HCT) 160-25 MG tablet   Other Relevant Orders   Lipid panel   Hyperglycemia    Check glucose today (truly fasting)      Relevant Orders   Hemoglobin A1c       Follow up plan: Return in about 6 months (around 06/01/2019) for follow-up visit with Dr. Sanda Klein.  An after-visit summary was printed and given to the patient at [place].   Please see the patient instructions which may contain other information and recommendations beyond what is mentioned above in the assessment and plan.  Meds ordered this encounter  Medications  . atorvastatin (LIPITOR) 20 MG tablet    Sig: Take 1 tablet (20 mg total) by mouth at bedtime.    Dispense:  90 tablet    Refill:  3  . metoprolol succinate (TOPROL-XL) 50 MG 24 hr tablet    Sig: TAKE 1 TABLET BY MOUTH ONCE DAILY TAKE  WITH  OR  IMMEDIATELY  FOLLOWING  A  MEAL    Dispense:  90 tablet    Refill:  3  . valsartan-hydrochlorothiazide (DIOVAN HCT) 160-25 MG tablet    Sig: Take 1 tablet by mouth daily. For blood pressure; this replaces losartan-hctz    Dispense:  90 tablet    Refill:  3    Orders Placed This Encounter  Procedures  . Hemoglobin A1c  . Lipid panel  . Hepatic function panel

## 2018-12-01 NOTE — Patient Instructions (Addendum)
Try to follow the DASH guidelines (DASH stands for Dietary Approaches to Stop Hypertension). Try to limit the sodium in your diet to no more than 1,500mg  of sodium per day. Certainly try to not exceed 2,000 mg per day at the very most. Do not add salt when cooking or at the table.  Check the sodium amount on labels when shopping, and choose items lower in sodium when given a choice. Avoid or limit foods that already contain a lot of sodium. Eat a diet rich in fruits and vegetables and whole grains, and try to lose weight if overweight or obese Try to limit saturated fats in your diet (bologna, hot dogs, barbeque, cheeseburgers, hamburgers, steak, bacon, sausage, cheese, etc.) and get more fresh fruits, vegetables, and whole grains Check out the information at familydoctor.org entitled "Nutrition for Weight Loss: What You Need to Know about Fad Diets" Try to lose between 1-2 pounds per week by taking in fewer calories and burning off more calories You can succeed by limiting portions, limiting foods dense in calories and fat, becoming more active, and drinking 8 glasses of water a day (64 ounces) Don't skip meals, especially breakfast, as skipping meals may alter your metabolism Do not use over-the-counter weight loss pills or gimmicks that claim rapid weight loss A healthy BMI (or body mass index) is between 18.5 and 24.9 You can calculate your ideal BMI at the [place] website ClubMonetize.fr  Obesity, Adult Obesity is the condition of having too much total body fat. Being overweight or obese means that your weight is greater than what is considered healthy for your body size. Obesity is determined by a measurement called BMI. BMI is an estimate of body fat and is calculated from height and weight. For adults, a BMI of 30 or higher is considered obese. Obesity can eventually lead to other health concerns and major illnesses,  including:  Stroke.  Coronary artery disease (CAD).  Type 2 diabetes.  Some types of cancer, including cancers of the colon, breast, uterus, and gallbladder.  Osteoarthritis.  High blood pressure (hypertension).  High cholesterol.  Sleep apnea.  Gallbladder stones.  Infertility problems. What are the causes? The main cause of obesity is taking in (consuming) more calories than your body uses for energy. Other factors that contribute to this condition may include:  Being born with genes that make you more likely to become obese.  Having a medical condition that causes obesity. These conditions include: ? Hypothyroidism. ? Polycystic ovarian syndrome (PCOS). ? Binge-eating disorder. ? Cushing syndrome.  Taking certain medicines, such as steroids, antidepressants, and seizure medicines.  Not being physically active (sedentary lifestyle).  Living where there are limited places to exercise safely or buy healthy foods.  Not getting enough sleep. What increases the risk? The following factors may increase your risk of this condition:  Having a family history of obesity.  Being a woman of African-American descent.  Being a man of Hispanic descent. What are the signs or symptoms? Having excessive body fat is the main symptom of this condition. How is this diagnosed? This condition may be diagnosed based on:  Your symptoms.  Your medical history.  A physical exam. Your health care provider may measure: ? Your BMI. If you are an adult with a BMI between 25 and less than 30, you are considered overweight. If you are an adult with a BMI of 30 or higher, you are considered obese. ? The distances around your hips and your waist (circumferences). These may  be compared to each other to help diagnose your condition. ? Your skinfold thickness. Your health care provider may gently pinch a fold of your skin and measure it. How is this treated? Treatment for this condition  often includes changing your lifestyle. Treatment may include some or all of the following:  Dietary changes. Work with your health care provider and a dietitian to set a weight-loss goal that is healthy and reasonable for you. Dietary changes may include eating: ? Smaller portions. A portion size is the amount of a particular food that is healthy for you to eat at one time. This varies from person to person. ? Low-calorie or low-fat options. ? More whole grains, fruits, and vegetables.  Regular physical activity. This may include aerobic activity (cardio) and strength training.  Medicine to help you lose weight. Your health care provider may prescribe medicine if you are unable to lose 1 pound a week after 6 weeks of eating more healthily and doing more physical activity.  Surgery. Surgical options may include gastric banding and gastric bypass. Surgery may be done if: ? Other treatments have not helped to improve your condition. ? You have a BMI of 40 or higher. ? You have life-threatening health problems related to obesity. Follow these instructions at home:  Eating and drinking   Follow recommendations from your health care provider about what you eat and drink. Your health care provider may advise you to: ? Limit fast foods, sweets, and processed snack foods. ? Choose low-fat options, such as low-fat milk instead of whole milk. ? Eat 5 or more servings of fruits or vegetables every day. ? Eat at home more often. This gives you more control over what you eat. ? Choose healthy foods when you eat out. ? Learn what a healthy portion size is. ? Keep low-fat snacks on hand. ? Avoid sugary drinks, such as soda, fruit juice, iced tea sweetened with sugar, and flavored milk. ? Eat a healthy breakfast.  Drink enough water to keep your urine clear or pale yellow.  Do not go without eating for long periods of time (do not fast) or follow a fad diet. Fasting and fad diets can be unhealthy  and even dangerous. Physical Activity  Exercise regularly, as told by your health care provider. Ask your health care provider what types of exercise are safe for you and how often you should exercise.  Warm up and stretch before being active.  Cool down and stretch after being active.  Rest between periods of activity. Lifestyle  Limit the time that you spend in front of your TV, computer, or video game system.  Find ways to reward yourself that do not involve food.  Limit alcohol intake to no more than 1 drink a day for nonpregnant women and 2 drinks a day for men. One drink equals 12 oz of beer, 5 oz of wine, or 1 oz of hard liquor. General instructions  Keep a weight loss journal to keep track of the food you eat and how much you exercise you get.  Take over-the-counter and prescription medicines only as told by your health care provider.  Take vitamins and supplements only as told by your health care provider.  Consider joining a support group. Your health care provider may be able to recommend a support group.  Keep all follow-up visits as told by your health care provider. This is important. Contact a health care provider if:  You are unable to meet your weight loss  goal after 6 weeks of dietary and lifestyle changes. This information is not intended to replace advice given to you by your health care provider. Make sure you discuss any questions you have with your health care provider. Document Released: 11/20/2004 Document Revised: 03/17/2016 Document Reviewed: 08/01/2015 Elsevier Interactive Patient Education  2019 Elsevier Inc.  Preventing Unhealthy [employer], Adult Staying at a healthy weight is important to your overall health. When fat builds up in your body, you may become overweight or obese. Being overweight or obese increases your risk of developing certain health problems, such as heart disease, diabetes, sleeping problems, joint problems, and some types of  cancer. Unhealthy weight gain is often the result of making unhealthy food choices or not getting enough exercise. You can make changes to your lifestyle to prevent obesity and stay as healthy as possible. What nutrition changes can be made?   Eat only as much as your body needs. To do this: ? Pay attention to signs that you are hungry or full. Stop eating as soon as you feel full. ? If you feel hungry, try drinking water first before eating. Drink enough water so your urine is clear or pale yellow. ? Eat smaller portions. Pay attention to portion sizes when eating out. ? Look at serving sizes on food labels. Most foods contain more than one serving per container. ? Eat the recommended number of calories for your gender and activity level. For most active people, a daily total of 2,000 calories is appropriate. If you are trying to lose weight or are not very active, you may need to eat fewer calories. Talk with your health care provider or a diet and nutrition specialist (dietitian) about how many calories you need each day.  Choose healthy foods, such as: ? Fruits and vegetables. At each meal, try to fill at least half of your plate with fruits and vegetables. ? Whole grains, such as whole-wheat bread, brown rice, and quinoa. ? Lean meats, such as chicken or fish. ? Other healthy proteins, such as beans, eggs, or tofu. ? Healthy fats, such as nuts, seeds, fatty fish, and olive oil. ? Low-fat or fat-free dairy products.  Check food labels, and avoid food and drinks that: ? Are high in calories. ? Have added sugar. ? Are high in sodium. ? Have saturated fats or trans fats.  Cook foods in healthier ways, such as by baking, broiling, or grilling.  Make a meal plan for the week, and shop with a grocery list to help you stay on track with your purchases. Try to avoid going to the grocery store when you are hungry.  When grocery shopping, try to shop around the outside of the store first,  where the fresh foods are. Doing this helps you to avoid prepackaged foods, which can be high in sugar, salt (sodium), and fat. What lifestyle changes can be made?   Exercise for 30 or more minutes on 5 or more days each week. Exercising may include brisk walking, yard work, biking, running, swimming, and team sports like basketball and soccer. Ask your health care provider which exercises are safe for you.  Do muscle-strengthening activities, such as lifting weights or using resistance bands, on 2 or more days a week.  Do not use any products that contain nicotine or tobacco, such as cigarettes and e-cigarettes. If you need help quitting, ask your health care provider.  Limit alcohol intake to no more than 1 drink a day for nonpregnant women and 2  drinks a day for men. One drink equals 12 oz of beer, 5 oz of wine, or 1 oz of hard liquor.  Try to get 7-9 hours of sleep each night. What other changes can be made?  Keep a food and activity journal to keep track of: ? What you ate and how many calories you had. Remember to count the calories in sauces, dressings, and side dishes. ? Whether you were active, and what exercises you did. ? Your calorie, weight, and activity goals.  Check your weight regularly. Track any changes. If you notice you have gained weight, make changes to your diet or activity routine.  Avoid taking weight-loss medicines or supplements. Talk to your health care provider before starting any new medicine or supplement.  Talk to your health care provider before trying any new diet or exercise plan. Why are these changes important? Eating healthy, staying active, and having healthy habits can help you to prevent obesity. Those changes also:  Help you manage stress and emotions.  Help you connect with friends and family.  Improve your self-esteem.  Improve your sleep.  Prevent long-term health problems. What can happen if changes are not made? Being obese or  overweight can cause you to develop joint or bone problems, which can make it hard for you to stay active or do activities you enjoy. Being obese or overweight also puts stress on your heart and lungs and can lead to health problems like diabetes, heart disease, and some cancers. Where to find more information Talk with your health care provider or a dietitian about healthy eating and healthy lifestyle choices. You may also find information from:  U.S. Department of Agriculture, MyPlate: FormerBoss.no  American Heart Association: www.heart.org  Centers for Disease Control and Prevention: http://www.wolf.info/ Summary  Staying at a healthy weight is important to your overall health. It helps you to prevent certain diseases and health problems, such as heart disease, diabetes, joint problems, sleep disorders, and some types of cancer.  Being obese or overweight can cause you to develop joint or bone problems, which can make it hard for you to stay active or do activities you enjoy.  You can prevent unhealthy weight gain by eating a healthy diet, exercising regularly, not smoking, limiting alcohol, and getting enough sleep.  Talk with your health care provider or a dietitian for guidance about healthy eating and healthy lifestyle choices. This information is not intended to replace advice given to you by your health care provider. Make sure you discuss any questions you have with your health care provider. Document Released: 10/14/2016 Document Revised: 07/24/2017 Document Reviewed: 11/19/2016 Elsevier Interactive Patient Education  2019 [employer].

## 2018-12-01 NOTE — Assessment & Plan Note (Addendum)
Goal LDL is less than 70; limit saturated fats; check lipids today

## 2018-12-01 NOTE — Assessment & Plan Note (Signed)
Try to follow DASH guidelines; controlled

## 2018-12-01 NOTE — Assessment & Plan Note (Signed)
Check glucose today (truly fasting)

## 2018-12-01 NOTE — Assessment & Plan Note (Signed)
Encouraged weight loss; he would like to get down to 200 pounds, aim for that over the next year

## 2018-12-02 ENCOUNTER — Encounter: Payer: Self-pay | Admitting: Family Medicine

## 2018-12-02 DIAGNOSIS — R7303 Prediabetes: Secondary | ICD-10-CM | POA: Insufficient documentation

## 2018-12-02 LAB — HEPATIC FUNCTION PANEL
AG Ratio: 1.4 (calc) (ref 1.0–2.5)
ALT: 19 U/L (ref 9–46)
AST: 22 U/L (ref 10–35)
Albumin: 3.9 g/dL (ref 3.6–5.1)
Alkaline phosphatase (APISO): 95 U/L (ref 35–144)
Bilirubin, Direct: 0.2 mg/dL (ref 0.0–0.2)
Globulin: 2.7 g/dL (calc) (ref 1.9–3.7)
Indirect Bilirubin: 0.8 mg/dL (calc) (ref 0.2–1.2)
Total Bilirubin: 1 mg/dL (ref 0.2–1.2)
Total Protein: 6.6 g/dL (ref 6.1–8.1)

## 2018-12-02 LAB — HEMOGLOBIN A1C
Hgb A1c MFr Bld: 6.1 % of total Hgb — ABNORMAL HIGH (ref <5.7)
Mean Plasma Glucose: 128 (calc)
eAG (mmol/L): 7.1 (calc)

## 2018-12-02 LAB — LIPID PANEL
Cholesterol: 142 mg/dL (ref <200)
HDL: 51 mg/dL (ref >=40)
LDL Cholesterol (Calc): 74 mg/dL (calc)
NON-HDL CHOLESTEROL (CALC): 91 mg/dL (ref <130)
Total CHOL/HDL Ratio: 2.8 (calc) (ref <5.0)
Triglycerides: 88 mg/dL (ref <150)

## 2018-12-09 DIAGNOSIS — R6889 Other general symptoms and signs: Secondary | ICD-10-CM | POA: Diagnosis not present

## 2018-12-12 MED ORDER — VANCOMYCIN HCL 10 G IV SOLR
1500.0000 mg | INTRAVENOUS | Status: AC
  Administered 2018-12-13: 1500 mg via INTRAVENOUS
  Filled 2018-12-12: qty 1500

## 2018-12-13 ENCOUNTER — Other Ambulatory Visit: Payer: Self-pay

## 2018-12-13 ENCOUNTER — Inpatient Hospital Stay
Admission: RE | Admit: 2018-12-13 | Discharge: 2018-12-16 | DRG: 355 | Disposition: A | Discharge status: Home / Self Care | Payer: Medicare Other | Attending: General Surgery | Admitting: General Surgery

## 2018-12-13 ENCOUNTER — Encounter: Payer: Self-pay | Admitting: *Deleted

## 2018-12-13 ENCOUNTER — Inpatient Hospital Stay: Payer: Medicare Other | Admitting: Anesthesiology

## 2018-12-13 ENCOUNTER — Encounter
Admission: RE | Disposition: A | Discharge status: Home / Self Care | Payer: Self-pay | Source: Home / Self Care | Attending: General Surgery

## 2018-12-13 DIAGNOSIS — R1084 Generalized abdominal pain: Secondary | ICD-10-CM | POA: Diagnosis not present

## 2018-12-13 DIAGNOSIS — K430 Incisional hernia with obstruction, without gangrene: Principal | ICD-10-CM | POA: Diagnosis present

## 2018-12-13 DIAGNOSIS — E785 Hyperlipidemia, unspecified: Secondary | ICD-10-CM | POA: Diagnosis present

## 2018-12-13 DIAGNOSIS — G8918 Other acute postprocedural pain: Secondary | ICD-10-CM | POA: Diagnosis not present

## 2018-12-13 DIAGNOSIS — I10 Essential (primary) hypertension: Secondary | ICD-10-CM | POA: Diagnosis present

## 2018-12-13 DIAGNOSIS — Z87891 Personal history of nicotine dependence: Secondary | ICD-10-CM | POA: Diagnosis not present

## 2018-12-13 DIAGNOSIS — K5900 Constipation, unspecified: Secondary | ICD-10-CM | POA: Diagnosis present

## 2018-12-13 DIAGNOSIS — K439 Ventral hernia without obstruction or gangrene: Secondary | ICD-10-CM | POA: Diagnosis not present

## 2018-12-13 DIAGNOSIS — K432 Incisional hernia without obstruction or gangrene: Secondary | ICD-10-CM | POA: Diagnosis not present

## 2018-12-13 HISTORY — PX: VENTRAL HERNIA REPAIR: SHX424

## 2018-12-13 SURGERY — REPAIR, HERNIA, VENTRAL
Anesthesia: General

## 2018-12-13 MED ORDER — DEXTROSE IN LACTATED RINGERS 5 % IV SOLN
INTRAVENOUS | Status: DC
  Administered 2018-12-13: 15:00:00 via INTRAVENOUS

## 2018-12-13 MED ORDER — SODIUM CHLORIDE (PF) 0.9 % IJ SOLN
INTRAMUSCULAR | Status: AC
  Filled 2018-12-13: qty 20

## 2018-12-13 MED ORDER — ACETAMINOPHEN 10 MG/ML IV SOLN
INTRAVENOUS | Status: AC
  Filled 2018-12-13: qty 100

## 2018-12-13 MED ORDER — MORPHINE SULFATE (PF) 2 MG/ML IV SOLN: 2.0000 mg | INTRAVENOUS | Status: DC | PRN

## 2018-12-13 MED ORDER — EPINEPHRINE PF 1 MG/ML IJ SOLN
INTRAMUSCULAR | Status: AC
  Filled 2018-12-13: qty 1

## 2018-12-13 MED ORDER — ONDANSETRON HCL 4 MG/2ML IJ SOLN
INTRAMUSCULAR | Status: DC | PRN
  Administered 2018-12-13: 4 mg via INTRAVENOUS

## 2018-12-13 MED ORDER — BUPIVACAINE HCL (PF) 0.5 % IJ SOLN
INTRAMUSCULAR | Status: DC | PRN
  Administered 2018-12-13: 20 mL via PERINEURAL

## 2018-12-13 MED ORDER — DEXAMETHASONE SODIUM PHOSPHATE 10 MG/ML IJ SOLN
INTRAMUSCULAR | Status: DC | PRN
  Administered 2018-12-13: 5 mg via INTRAVENOUS

## 2018-12-13 MED ORDER — PHENYLEPHRINE HCL 10 MG/ML IJ SOLN
INTRAMUSCULAR | Status: AC
  Filled 2018-12-13: qty 1

## 2018-12-13 MED ORDER — FENTANYL CITRATE (PF) 100 MCG/2ML IJ SOLN
25.0000 ug | INTRAMUSCULAR | Status: DC | PRN
  Administered 2018-12-13: 25 ug via INTRAVENOUS

## 2018-12-13 MED ORDER — SUGAMMADEX SODIUM 200 MG/2ML IV SOLN
INTRAVENOUS | Status: AC
  Filled 2018-12-13: qty 2

## 2018-12-13 MED ORDER — SODIUM CHLORIDE 0.9 % IV SOLN
INTRAVENOUS | Status: DC | PRN
  Administered 2018-12-13: 250 mL via INTRAVENOUS

## 2018-12-13 MED ORDER — ACETAMINOPHEN 10 MG/ML IV SOLN
1000.0000 mg | Freq: Four times a day (QID) | INTRAVENOUS | Status: DC
  Administered 2018-12-13 – 2018-12-14 (×3): 1000 mg via INTRAVENOUS
  Filled 2018-12-13 (×4): qty 100

## 2018-12-13 MED ORDER — ROCURONIUM BROMIDE 50 MG/5ML IV SOLN
INTRAVENOUS | Status: AC
  Filled 2018-12-13: qty 1

## 2018-12-13 MED ORDER — LIDOCAINE HCL (PF) 1 % IJ SOLN
INTRAMUSCULAR | Status: AC
  Filled 2018-12-13: qty 5

## 2018-12-13 MED ORDER — PROPOFOL 10 MG/ML IV BOLUS
INTRAVENOUS | Status: DC | PRN
  Administered 2018-12-13: 150 mg via INTRAVENOUS

## 2018-12-13 MED ORDER — BUPIVACAINE LIPOSOME 1.3 % IJ SUSP
INTRAMUSCULAR | Status: AC
  Filled 2018-12-13: qty 20

## 2018-12-13 MED ORDER — ENOXAPARIN SODIUM 40 MG/0.4ML ~~LOC~~ SOLN
40.0000 mg | SUBCUTANEOUS | Status: DC
  Administered 2018-12-14 – 2018-12-16 (×3): 40 mg via SUBCUTANEOUS
  Filled 2018-12-13 (×3): qty 0.4

## 2018-12-13 MED ORDER — MEPERIDINE HCL 50 MG/ML IJ SOLN: 6.2500 mg | INTRAMUSCULAR | Status: DC | PRN

## 2018-12-13 MED ORDER — FENTANYL CITRATE (PF) 100 MCG/2ML IJ SOLN
INTRAMUSCULAR | Status: AC
  Filled 2018-12-13: qty 2

## 2018-12-13 MED ORDER — DEXAMETHASONE SODIUM PHOSPHATE 10 MG/ML IJ SOLN
INTRAMUSCULAR | Status: AC
  Filled 2018-12-13: qty 1

## 2018-12-13 MED ORDER — ROCURONIUM BROMIDE 100 MG/10ML IV SOLN
INTRAVENOUS | Status: DC | PRN
  Administered 2018-12-13: 50 mg via INTRAVENOUS
  Administered 2018-12-13: 10 mg via INTRAVENOUS

## 2018-12-13 MED ORDER — GABAPENTIN 300 MG PO CAPS
300.0000 mg | ORAL_CAPSULE | ORAL | Status: AC
  Administered 2018-12-13: 300 mg via ORAL

## 2018-12-13 MED ORDER — BUPIVACAINE LIPOSOME 1.3 % IJ SUSP
INTRAMUSCULAR | Status: DC | PRN
  Administered 2018-12-13: 20 mL via PERINEURAL

## 2018-12-13 MED ORDER — FENTANYL CITRATE (PF) 100 MCG/2ML IJ SOLN
50.0000 ug | Freq: Once | INTRAMUSCULAR | Status: AC
  Administered 2018-12-13: 50 ug via INTRAVENOUS

## 2018-12-13 MED ORDER — ASPIRIN EC 81 MG PO TBEC
81.0000 mg | DELAYED_RELEASE_TABLET | Freq: Every day | ORAL | Status: DC
  Administered 2018-12-13 – 2018-12-16 (×4): 81 mg via ORAL
  Filled 2018-12-13 (×4): qty 1

## 2018-12-13 MED ORDER — SUGAMMADEX SODIUM 200 MG/2ML IV SOLN
INTRAVENOUS | Status: DC | PRN
  Administered 2018-12-13: 200 mg via INTRAVENOUS

## 2018-12-13 MED ORDER — ACETAMINOPHEN 10 MG/ML IV SOLN
INTRAVENOUS | Status: DC | PRN
  Administered 2018-12-13: 1000 mg via INTRAVENOUS

## 2018-12-13 MED ORDER — LIDOCAINE HCL (CARDIAC) PF 100 MG/5ML IV SOSY
PREFILLED_SYRINGE | INTRAVENOUS | Status: DC | PRN
  Administered 2018-12-13: 80 mg via INTRAVENOUS

## 2018-12-13 MED ORDER — FENTANYL CITRATE (PF) 100 MCG/2ML IJ SOLN
INTRAMUSCULAR | Status: AC
  Administered 2018-12-13: 50 ug via INTRAVENOUS
  Filled 2018-12-13: qty 2

## 2018-12-13 MED ORDER — LIDOCAINE HCL (PF) 2 % IJ SOLN
INTRAMUSCULAR | Status: AC
  Filled 2018-12-13: qty 10

## 2018-12-13 MED ORDER — ATORVASTATIN CALCIUM 20 MG PO TABS
20.0000 mg | ORAL_TABLET | Freq: Every day | ORAL | Status: DC
  Administered 2018-12-13 – 2018-12-15 (×3): 20 mg via ORAL
  Filled 2018-12-13 (×3): qty 1

## 2018-12-13 MED ORDER — PROPOFOL 10 MG/ML IV BOLUS
INTRAVENOUS | Status: AC
  Filled 2018-12-13: qty 20

## 2018-12-13 MED ORDER — GABAPENTIN 300 MG PO CAPS
ORAL_CAPSULE | ORAL | Status: AC
  Filled 2018-12-13: qty 1

## 2018-12-13 MED ORDER — SODIUM CHLORIDE FLUSH 0.9 % IV SOLN
INTRAVENOUS | Status: AC
  Filled 2018-12-13: qty 20

## 2018-12-13 MED ORDER — MIDAZOLAM HCL 2 MG/2ML IJ SOLN
INTRAMUSCULAR | Status: AC
  Filled 2018-12-13: qty 2

## 2018-12-13 MED ORDER — OXYCODONE HCL 5 MG/5ML PO SOLN: 5.0000 mg | Freq: Once | ORAL | Status: DC | PRN

## 2018-12-13 MED ORDER — MIDAZOLAM HCL 2 MG/2ML IJ SOLN
INTRAMUSCULAR | Status: DC | PRN
  Administered 2018-12-13: 1 mg via INTRAVENOUS

## 2018-12-13 MED ORDER — PROMETHAZINE HCL 25 MG/ML IJ SOLN: 6.2500 mg | INTRAMUSCULAR | Status: DC | PRN

## 2018-12-13 MED ORDER — ONDANSETRON HCL 4 MG/2ML IJ SOLN
INTRAMUSCULAR | Status: AC
  Filled 2018-12-13: qty 2

## 2018-12-13 MED ORDER — MIDAZOLAM HCL 2 MG/2ML IJ SOLN
1.0000 mg | Freq: Once | INTRAMUSCULAR | Status: AC
  Administered 2018-12-13: 1 mg via INTRAVENOUS

## 2018-12-13 MED ORDER — FENTANYL CITRATE (PF) 100 MCG/2ML IJ SOLN
INTRAMUSCULAR | Status: DC | PRN
  Administered 2018-12-13 (×2): 25 ug via INTRAVENOUS
  Administered 2018-12-13 (×4): 50 ug via INTRAVENOUS
  Administered 2018-12-13 (×2): 25 ug via INTRAVENOUS

## 2018-12-13 MED ORDER — LACTATED RINGERS IV SOLN
INTRAVENOUS | Status: DC
  Administered 2018-12-13: 07:00:00 via INTRAVENOUS

## 2018-12-13 MED ORDER — OXYCODONE HCL 5 MG PO TABS: 5.0000 mg | ORAL_TABLET | Freq: Once | ORAL | Status: DC | PRN

## 2018-12-13 MED ORDER — SUCCINYLCHOLINE CHLORIDE 20 MG/ML IJ SOLN
INTRAMUSCULAR | Status: AC
  Filled 2018-12-13: qty 1

## 2018-12-13 MED ORDER — BUPIVACAINE HCL (PF) 0.5 % IJ SOLN
INTRAMUSCULAR | Status: AC
  Filled 2018-12-13: qty 30

## 2018-12-13 MED ORDER — MIDAZOLAM HCL 2 MG/2ML IJ SOLN
INTRAMUSCULAR | Status: AC
  Administered 2018-12-13: 1 mg via INTRAVENOUS
  Filled 2018-12-13: qty 2

## 2018-12-13 MED ORDER — BUPIVACAINE HCL (PF) 0.5 % IJ SOLN
INTRAMUSCULAR | Status: AC
  Filled 2018-12-13: qty 20

## 2018-12-13 MED ORDER — LIDOCAINE HCL 1 % IJ SOLN
INTRAMUSCULAR | Status: AC
  Filled 2018-12-13: qty 2

## 2018-12-13 MED ORDER — FAMOTIDINE 20 MG PO TABS
ORAL_TABLET | ORAL | Status: AC
  Filled 2018-12-13: qty 1

## 2018-12-13 MED ORDER — EPHEDRINE SULFATE 50 MG/ML IJ SOLN
INTRAMUSCULAR | Status: DC | PRN
  Administered 2018-12-13 (×6): 5 mg via INTRAVENOUS

## 2018-12-13 MED ORDER — FAMOTIDINE 20 MG PO TABS
20.0000 mg | ORAL_TABLET | Freq: Once | ORAL | Status: AC
  Administered 2018-12-13: 20 mg via ORAL

## 2018-12-13 MED ORDER — LIDOCAINE HCL (PF) 1 % IJ SOLN
INTRAMUSCULAR | Status: DC | PRN
  Administered 2018-12-13: 5 mL

## 2018-12-13 MED ORDER — IBUPROFEN 400 MG PO TABS
400.0000 mg | ORAL_TABLET | ORAL | Status: DC | PRN
  Administered 2018-12-13: 400 mg via ORAL
  Filled 2018-12-13: qty 1

## 2018-12-13 MED ORDER — EPHEDRINE SULFATE 50 MG/ML IJ SOLN
INTRAMUSCULAR | Status: AC
  Filled 2018-12-13: qty 1

## 2018-12-13 MED ORDER — METOPROLOL SUCCINATE ER 25 MG PO TB24
25.0000 mg | ORAL_TABLET | Freq: Every day | ORAL | Status: DC
  Administered 2018-12-13 – 2018-12-16 (×4): 25 mg via ORAL
  Filled 2018-12-13 (×4): qty 1

## 2018-12-13 MED ORDER — OXYCODONE HCL 5 MG PO TABS
5.0000 mg | ORAL_TABLET | ORAL | Status: DC | PRN
  Administered 2018-12-13 – 2018-12-16 (×3): 5 mg via ORAL
  Filled 2018-12-13 (×3): qty 1

## 2018-12-13 SURGICAL SUPPLY — 43 items
BLADE SURG 15 STRL SS SAFETY (BLADE) ×3 IMPLANT
BULB RESERV EVAC DRAIN JP 100C (MISCELLANEOUS) ×6 IMPLANT
CANISTER SUCT 1200ML W/VALVE (MISCELLANEOUS) ×3 IMPLANT
CHLORAPREP W/TINT 26ML (MISCELLANEOUS) ×3 IMPLANT
CLOSURE WOUND 1/2 X4 (GAUZE/BANDAGES/DRESSINGS)
COVER WAND RF STERILE (DRAPES) ×3 IMPLANT
DRAIN CHANNEL JP 15F RND 16 (MISCELLANEOUS) ×12 IMPLANT
DRAPE CHEST BREAST 77X106 FENE (MISCELLANEOUS) IMPLANT
DRAPE LAPAROTOMY 100X77 ABD (DRAPES) ×3 IMPLANT
DRSG OPSITE POSTOP 4X14 (GAUZE/BANDAGES/DRESSINGS) ×6 IMPLANT
DRSG TEGADERM 4X4.75 (GAUZE/BANDAGES/DRESSINGS) IMPLANT
DRSG TELFA 3X8 NADH (GAUZE/BANDAGES/DRESSINGS) IMPLANT
ELECT REM PT RETURN 9FT ADLT (ELECTROSURGICAL) ×3
ELECTRODE REM PT RTRN 9FT ADLT (ELECTROSURGICAL) ×1 IMPLANT
GAUZE SPONGE 4X4 12PLY STRL (GAUZE/BANDAGES/DRESSINGS) IMPLANT
GLOVE BIO SURGEON STRL SZ7.5 (GLOVE) ×9 IMPLANT
GLOVE INDICATOR 8.0 STRL GRN (GLOVE) ×9 IMPLANT
GOWN STRL REUS W/ TWL LRG LVL3 (GOWN DISPOSABLE) ×3 IMPLANT
GOWN STRL REUS W/TWL LRG LVL3 (GOWN DISPOSABLE) ×6
KIT TURNOVER KIT A (KITS) ×3 IMPLANT
LABEL OR SOLS (LABEL) ×3 IMPLANT
MESH SOFT 12X12IN BARD (Mesh General) ×3 IMPLANT
NEEDLE HYPO 22GX1.5 SAFETY (NEEDLE) ×3 IMPLANT
NEEDLE HYPO 25X1 1.5 SAFETY (NEEDLE) IMPLANT
NS IRRIG 500ML POUR BTL (IV SOLUTION) ×3 IMPLANT
PACK BASIN MINOR ARMC (MISCELLANEOUS) ×3 IMPLANT
PAD ABD DERMACEA PRESS 5X9 (GAUZE/BANDAGES/DRESSINGS) ×3 IMPLANT
RETAINER VISCERA MED (MISCELLANEOUS) ×3 IMPLANT
SPONGE DRAIN TRACH 4X4 STRL 2S (GAUZE/BANDAGES/DRESSINGS) ×3 IMPLANT
SPONGE LAP 18X18 RF (DISPOSABLE) ×15 IMPLANT
STAPLER SKIN PROX 35W (STAPLE) ×3 IMPLANT
STRIP CLOSURE SKIN 1/2X4 (GAUZE/BANDAGES/DRESSINGS) IMPLANT
SUT MAXON ABS #0 GS21 30IN (SUTURE) ×33 IMPLANT
SUT SURGILON 0 BLK (SUTURE) ×6 IMPLANT
SUT VIC AB 0 CT1 36 (SUTURE) ×21 IMPLANT
SUT VIC AB 2-0 BRD 54 (SUTURE) IMPLANT
SUT VIC AB 3-0 SH 27 (SUTURE) ×2
SUT VIC AB 3-0 SH 27X BRD (SUTURE) ×1 IMPLANT
SUT VIC AB 4-0 FS2 27 (SUTURE) ×3 IMPLANT
SUT VICRYL+ 3-0 144IN (SUTURE) ×6 IMPLANT
SYR 10ML LL (SYRINGE) ×3 IMPLANT
SYR 3ML LL SCALE MARK (SYRINGE) IMPLANT
TRAY FOLEY MTR SLVR 16FR STAT (SET/KITS/TRAYS/PACK) ×3 IMPLANT

## 2018-12-13 NOTE — Anesthesia Procedure Notes (Signed)
Procedure Name: Intubation Date/Time: 12/13/2018 8:06 AM Performed by: Hedda Slade, CRNA Pre-anesthesia Checklist: Patient identified, Patient being monitored, Timeout performed, Emergency Drugs available and Suction available Patient Re-evaluated:Patient Re-evaluated prior to induction Oxygen Delivery Method: Circle system utilized Preoxygenation: Pre-oxygenation with 100% oxygen Induction Type: IV induction Ventilation: Mask ventilation without difficulty and Oral airway inserted - appropriate to patient size Laryngoscope Size: Mac and 4 Grade View: Grade I Tube type: Oral Tube size: 7.5 mm Number of attempts: 1 Airway Equipment and Method: Stylet Placement Confirmation: ETT inserted through vocal cords under direct vision,  positive ETCO2 and breath sounds checked- equal and bilateral Secured at: 22 cm Tube secured with: Tape Dental Injury: Teeth and Oropharynx as per pre-operative assessment

## 2018-12-13 NOTE — Op Note (Signed)
Preoperative diagnosis: Ventral hernia.  Postoperative diagnosis: Same.  Operative procedure: Repair of multiple abdominal wall hernias with component separation, placement of retrorectus mesh.  Operating Surgeon: Hervey Ard, MD  Assistant: Arvilla Meres, RNFA.  Anesthesia: General endotracheal, Exparel block.  Estimated blood loss: 50 cc.  Clinical note: This 73 year old male underwent exploratory laparotomy in June 2018 for small bowel obstruction.  He has developed multiple defects along the midline wound.  He was admitted for elective repair.  He received vancomycin for preoperative antibiotics.  SCD stockings for DVT prevention.  Hair was removed from the abdominal wall with clippers prior to presentation.  Operative note: The patient underwent general endotracheal anesthesia without difficulty.  A Foley catheter was placed by the nurse.  The abdomen was cleansed with ChloraPrep and draped.  The old scar was excised and discarded.  The adipose layer was divided with cautery and the first of the multiple defects in the upper abdomen were encountered.  The midline wound was opened in its entirety and extended slightly superior and inferiorly to provide complete exposure.  Adhesions of the colon to the anterior abdominal wall were taken down with sharp dissection.  Adhesions from the omentum were taken down with cautery dissection.  Good hemostasis was noted.  The abdominal wall was freed in its entirety to the posterior axillary line.    The retrorectus space was entered just lateral to the linea alba.  This was opened to the xiphoid superiorly and just above the pubis inferiorly.  This was done on both sides.  Retrorectus space was was then cleared with hemostasis of perforating branches from the inferior and superior epigastric vessels controlled with 3-0 Vicryl ties.  The posterior rectus sheath was approximated with interrupted 2-0 Vicryl sutures without difficulty.  The retrorectus  space was 10 inches in transverse diameter and 12 inches in craniocaudad dimensions.  A Bard soft mesh was chosen.  The 12 x 12 mesh trimmed to 10 x 12.  This was smoothed into the retrorectus space.  It was anchored to the xiphoid superiorly and to the inferior aspect of the fascia with 0 Maxon figure-of-eight sutures.  Making use of a Revirden needle passer 0 Maxon trans-fascial sutures x4 were placed on each side under direct vision.  These were anchored to the mesh which was placed on gentle tension.  2-15 [place] Blake drains were then placed superiorly into the retrorectus space.  The midline fascia was closed with interrupted 0 Maxon figure-of-eight sutures incorporating the mesh with each bite.  This was done with no tension.  The subcutaneous space was approximated with a running 2-0 Vicryl suture.  The skin was closed with staples.  Trans-fascial suture sites were closed with Dermabond.  Drain sites were covered with gauze and Tegaderm.  When the Foley catheter was removed there was noted to be blood coming from the urethra.  Dr. Erlene Quan from urology stepped in for assessment.  She felt that likely the balloon had been inflated in the prostate and this bleeding should stop spontaneously.  She did recommend checking post void residual.  No additional manipulation at this time.  Anesthesia was excellent with no coughing during extubation.  He had a honeycomb dressing placed over the wound followed by ABD pads and an abdominal binder.  The patient tolerated the procedure well and was taken to the recovery room in stable condition.

## 2018-12-13 NOTE — Transfer of Care (Signed)
Immediate Anesthesia Transfer of Care Note  Patient: Drew Mcmahon  Procedure(s) Performed: HERNIA REPAIR VENTRAL ADULT WITH COMPONENT SEPARATOR MESH (N/A )  Patient Location: PACU  Anesthesia Type:General  Level of Consciousness: awake, alert  and oriented  Airway & Oxygen Therapy: Patient Spontanous Breathing and Patient connected to face mask oxygen  Post-op Assessment: Report given to RN and Post -op Vital signs reviewed and stable  Post vital signs: Reviewed and stable  Last Vitals:  Vitals Value Taken Time  BP 123/60 12/13/2018 11:30 AM  Temp 36.7 C 12/13/2018 11:30 AM  Pulse 69 12/13/2018 11:30 AM  Resp 18 12/13/2018 11:30 AM  SpO2 97 % 12/13/2018 11:30 AM  Vitals shown include unvalidated device data.  Last Pain:  Vitals:   12/13/18 1130  TempSrc:   PainSc: 0-No pain         Complications: No apparent anesthesia complications

## 2018-12-13 NOTE — Anesthesia Procedure Notes (Signed)
Anesthesia Regional Block: TAP block   Pre-Anesthetic Checklist: ,, timeout performed, Correct Patient, Correct Site, Correct Laterality, Correct Procedure, Correct Position, site marked, Risks and benefits discussed,  Surgical consent,  Pre-op evaluation,  At surgeon's request and post-op pain management  Laterality: Left and Right  Prep: chloraprep       Needles:  Injection technique: Single-shot  Needle Type: Echogenic Needle     Needle Length: 10cm  Needle Gauge: 21     Additional Needles:   Procedures:,,,, ultrasound used (permanent image in chart),,,,  Narrative:  Start time: 12/13/2018 7:40 AM End time: 12/13/2018 7:49 AM  Performed by: Personally  Anesthesiologist: Randa Lynn, Amy, MD  Additional Notes: Medications documented in anesthetic record

## 2018-12-13 NOTE — H&P (Signed)
No interval change in clinical history or exam. For repair of large ventral hernia with mesh reinforcement.  Discussed role of early ambulation.

## 2018-12-13 NOTE — Anesthesia Postprocedure Evaluation (Signed)
Anesthesia Post Note  Patient: JEREMIYAH CULLENS  Procedure(s) Performed: HERNIA REPAIR VENTRAL ADULT WITH COMPONENT SEPARATOR MESH (N/A )  Patient location during evaluation: PACU Anesthesia Type: General Level of consciousness: awake and alert and oriented Pain management: pain level controlled Vital Signs Assessment: post-procedure vital signs reviewed and stable Respiratory status: spontaneous breathing, nonlabored ventilation and respiratory function stable Cardiovascular status: blood pressure returned to baseline and stable Postop Assessment: no signs of nausea or vomiting Anesthetic complications: no     Last Vitals:  Vitals:   12/13/18 1358 12/13/18 1431  BP: 128/69 125/71  Pulse: 82 88  Resp:  18  Temp: (!) 36.3 C 36.5 C  SpO2: 97% 96%    Last Pain:  Vitals:   12/13/18 1431  TempSrc: Oral  PainSc:                  Harutyun Monteverde

## 2018-12-13 NOTE — Anesthesia Post-op Follow-up Note (Signed)
Anesthesia QCDR form completed.        

## 2018-12-13 NOTE — Anesthesia Preprocedure Evaluation (Signed)
Anesthesia Evaluation  Patient identified by MRN, date of birth, ID band Patient awake    Reviewed: Allergy & Precautions, NPO status , Patient's Chart, lab work & pertinent test results  History of Anesthesia Complications Negative for: history of anesthetic complications  Airway Mallampati: II  TM Distance: >3 FB Neck ROM: Full    Dental  (+) Missing   Pulmonary neg sleep apnea, neg COPD, former smoker,    breath sounds clear to auscultation- rhonchi (-) wheezing      Cardiovascular hypertension, (-) CAD, (-) Past MI, (-) Cardiac Stents and (-) CABG  Rhythm:Regular Rate:Normal - Systolic murmurs and - Diastolic murmurs    Neuro/Psych neg Seizures negative neurological ROS  negative psych ROS   GI/Hepatic negative GI ROS, Neg liver ROS,   Endo/Other  negative endocrine ROSneg diabetes  Renal/GU negative Renal ROS     Musculoskeletal negative musculoskeletal ROS (+)   Abdominal (+) + obese,   Peds  Hematology negative hematology ROS (+)   Anesthesia Other Findings Past Medical History: No date: Elevated lipids 2007: Hip fracture (No Name) No date: Hyperlipidemia No date: Hypertension 07/19/2018: Osteopenia     Comment:  Sept 2019; next scan Sept 2021   Reproductive/Obstetrics negative OB ROS                             Anesthesia Physical Anesthesia Plan  ASA: II  Anesthesia Plan: General   Post-op Pain Management:  Regional for Post-op pain   Induction: Intravenous  PONV Risk Score and Plan: 1 and Ondansetron and Midazolam  Airway Management Planned: Oral ETT  Additional Equipment:   Intra-op Plan:   Post-operative Plan: Extubation in OR  Informed Consent: I have reviewed the patients History and Physical, chart, labs and discussed the procedure including the risks, benefits and alternatives for the proposed anesthesia with the patient or authorized representative who has  indicated his/her understanding and acceptance.     Dental advisory given  Plan Discussed with: CRNA and Anesthesiologist  Anesthesia Plan Comments:         Anesthesia Quick Evaluation

## 2018-12-14 MED ORDER — ACETAMINOPHEN 325 MG PO TABS
650.0000 mg | ORAL_TABLET | ORAL | Status: DC
  Administered 2018-12-14 – 2018-12-16 (×12): 650 mg via ORAL
  Filled 2018-12-14 (×12): qty 2

## 2018-12-14 NOTE — Progress Notes (Signed)
AVSS.  Losartan on hold at present. Good pain control.  Ambulated last night and this AM without difficulty. No nausea. Voiding OK, bloody to start as expected. Lungs: Clear. Cardio: RR. ABD: Binder intact. JP: Small amount of old blood (50 cc). Extrem: Soft. IMP: Doing well. Plan: Advance diet. D/C IV fluids.

## 2018-12-15 LAB — CREATININE, SERUM
Creatinine, Ser: 0.96 mg/dL (ref 0.61–1.24)
GFR calc Af Amer: >60 mL/min (ref >60)
GFR calc non Af Amer: >60 mL/min (ref >60)

## 2018-12-15 MED ORDER — BISACODYL 10 MG RE SUPP
10.0000 mg | Freq: Once | RECTAL | Status: AC
  Administered 2018-12-15: 10 mg via RECTAL
  Filled 2018-12-15: qty 1

## 2018-12-15 NOTE — Progress Notes (Signed)
Drew Mcmahon. Reports that last night he felt a little short winded, in that it took more effort to take a deep breath.  Loosened his binder with some relief. Denies chest pain with deep breath, hemoptysis or cough.  Passing some flatus, but no BM yet. Tolerated regular diet yesterday without nausea. Ambulated 20+ laps by nursing report. Exam: Sats: 95-97% on RA. Inspirex at 1750 (same as yesterday). Lungs: Clear. Cardio: RR. ABD: Moderate distension, some tympany. Soft. Incision: Clean and dry. Blake drains: 120/ 24 each. Extrem: Soft.  IMP: Abdominal distension interfering w/ diaphragmatic movement.   Plan: D/C binder. Continue ambulation. He reports occasional constipation. Will use a dulcolax suppository today.

## 2018-12-15 NOTE — Progress Notes (Signed)
AVSS. BP/ Oximetry stable. + results from suppository, flatus and stool with relieve of abdominal pressure. No nausea. Tolerated diet well. Abd: Less distended, still with some tympany. Soft.  Wound Clean. Has had some more incisional discomfort since this AM. May be coming to the end of the Exparel effect. Encouraged to use oral meds as needed to fascilitate ambulation.

## 2018-12-16 ENCOUNTER — Other Ambulatory Visit: Payer: Self-pay | Admitting: General Surgery

## 2018-12-16 MED ORDER — OXYCODONE-ACETAMINOPHEN 5-325 MG PO TABS: 1.0000 | ORAL_TABLET | ORAL | 0 refills | Status: DC | PRN

## 2018-12-16 MED ORDER — HYDROCODONE-ACETAMINOPHEN 5-325 MG PO TABS: 1.0000 | ORAL_TABLET | ORAL | 0 refills | Status: DC | PRN

## 2018-12-16 NOTE — Care Management Important Message (Signed)
Copy of signed Medicare IM left with patient in room. 

## 2018-12-16 NOTE — Progress Notes (Signed)
12/16/2018 5:25 PM  [name] to be D/C'd Home per MD order.  Discussed prescriptions and follow up appointments with the patient. Prescriptions given to patient, medication list explained in detail. Pt verbalized understanding.    Vitals:   12/16/18 0534 12/16/18 1329  BP: 135/68 135/70  Pulse: 63 68  Resp: 18   Temp: 98.1 F (36.7 C)   SpO2: 97% 99%    Skin clean, dry and intact without evidence of skin break down, no evidence of skin tears noted. IV catheter discontinued intact. Site without signs and symptoms of complications. Dressing and pressure applied. Pt denies pain at this time. No complaints noted.  An After Visit Summary was printed and given to the patient. Patient escorted via [place], and D/C home via private auto.  [name]

## 2018-12-16 NOTE — Progress Notes (Signed)
AVSS.  Reported some mild nausea before breakfast, resolved after breakfast. No further SOB. Ambulating well. Voiding well. BM w/ minimal flatus.  Lungs: Clear. Cardio:RR ABD: Soft, moderate distension, some tympany. Blake volume falling, left drain removed. Wound: Small amount of old blood along midline incision. No fresh bleeding.  Anticipate patient will be able to go home later today if GI function stable.

## 2018-12-16 NOTE — Final Progress Note (Signed)
Did well today. Ambulated well. Tolerated diet. Passing gas.  Voiding well. Drain volume down. Abd: Soft. Lungs: Clear.  Sats: 99%.

## 2018-12-21 ENCOUNTER — Other Ambulatory Visit: Payer: Self-pay | Admitting: General Surgery

## 2018-12-21 ENCOUNTER — Telehealth: Payer: Self-pay | Admitting: *Deleted

## 2018-12-21 ENCOUNTER — Telehealth: Payer: Self-pay | Admitting: General Surgery

## 2018-12-21 MED ORDER — PROMETHAZINE HCL 12.5 MG PO TABS: 12.5000 mg | ORAL_TABLET | ORAL | 0 refills | Status: DC | PRN

## 2018-12-21 NOTE — Telephone Encounter (Signed)
Patient's wife stated he isn't feeling well. Low grade fever on Sunday, she didn't take temperature with thermometer, he took tylenol and fever went away..  No chills. He vomited Sunday, and three times on Monday. He has vomited today, but no fever. He is having regular bowel movements. She feels he is not taking enough fluids and is dehydrated.  His incision is free of any redness, drainage and does not feel warm to the touch.   Spoke with Hoyle Sauer at the [employer] and she is asking Dr.Byrnett what he would like for [place] to tell this patient.   Spoke with Hoyle Sauer and Dr.Byrnett will call patient.

## 2018-12-21 NOTE — Progress Notes (Signed)
Wife called to report poor po tolerance and vomiting for the last 36 hours. Bowels moving well, minimal pain, no abdominal distension.  She reports some fever (back of hand warm).   Will have him stay with clear liquids/ saltines today.  RX for phenergan 12.5 mg tablets sent to [facility].  To call back by 4 PM today with progress report.

## 2018-12-21 NOTE — Telephone Encounter (Signed)
Patient started feeling sick on Sunday and started vomiting Sunday night. Patient doesn't want to eat and has not ate anything since Sunday. He can not keep anything down. He had Ventral Hernia Repair surgery on 12/13/18 by Dr.Byrnett. Please call and advise.

## 2018-12-21 NOTE — Telephone Encounter (Signed)
I talked with  the wife and she states the nausea is all most gone, no vomiting.He had a good BM. His appetite is still poor but she will encourage fluids. Aware to call back with status update uin the morning, appreciates call.

## 2018-12-21 NOTE — Telephone Encounter (Signed)
Patients spouse called asking for you to give them a call back. Please call patient and advise.

## 2018-12-22 ENCOUNTER — Telehealth: Payer: Self-pay | Admitting: *Deleted

## 2018-12-22 NOTE — Telephone Encounter (Signed)
She states no further vomiting. He has started eating egg, small amount oatmeal and a couple of chips. He is drinking as well. Follow up as scheduled.

## 2018-12-22 NOTE — Telephone Encounter (Signed)
Patients wife called and wants you to give her a call back when you get a chance, she stated that you were expecting her call.

## 2018-12-23 ENCOUNTER — Encounter: Payer: Self-pay | Admitting: General Surgery

## 2018-12-23 ENCOUNTER — Ambulatory Visit (INDEPENDENT_AMBULATORY_CARE_PROVIDER_SITE_OTHER): Payer: Medicare Other | Admitting: General Surgery

## 2018-12-23 ENCOUNTER — Other Ambulatory Visit: Payer: Self-pay

## 2018-12-23 ENCOUNTER — Other Ambulatory Visit
Admission: RE | Admit: 2018-12-23 | Discharge: 2018-12-23 | Disposition: A | Discharge status: Home / Self Care | Payer: Medicare Other | Source: Ambulatory Visit | Attending: General Surgery | Admitting: General Surgery

## 2018-12-23 ENCOUNTER — Encounter: Payer: Self-pay | Admitting: *Deleted

## 2018-12-23 ENCOUNTER — Ambulatory Visit
Admission: RE | Admit: 2018-12-23 | Discharge: 2018-12-23 | Disposition: A | Discharge status: Home / Self Care | Payer: Medicare Other | Source: Ambulatory Visit | Attending: General Surgery | Admitting: General Surgery

## 2018-12-23 VITALS — BP 154/90 | HR 121 | Temp 97.2°F | Resp 18 | Ht 73.0 in | Wt 226.4 lb

## 2018-12-23 DIAGNOSIS — R110 Nausea: Secondary | ICD-10-CM

## 2018-12-23 DIAGNOSIS — K439 Ventral hernia without obstruction or gangrene: Secondary | ICD-10-CM | POA: Diagnosis not present

## 2018-12-23 DIAGNOSIS — R112 Nausea with vomiting, unspecified: Secondary | ICD-10-CM

## 2018-12-23 DIAGNOSIS — Z9889 Other specified postprocedural states: Secondary | ICD-10-CM

## 2018-12-23 LAB — CBC WITH DIFFERENTIAL/PLATELET
Abs Immature Granulocytes: 0.08 10*3/uL — ABNORMAL HIGH (ref 0.00–0.07)
BASOS PCT: 1 %
Basophils Absolute: 0.1 10*3/uL (ref 0.0–0.1)
Eosinophils Absolute: 0.8 10*3/uL — ABNORMAL HIGH (ref 0.0–0.5)
Eosinophils Relative: 7 %
HCT: 44.6 % (ref 39.0–52.0)
Hemoglobin: 14.8 g/dL (ref 13.0–17.0)
Immature Granulocytes: 1 %
Lymphocytes Relative: 26 %
Lymphs Abs: 2.9 10*3/uL (ref 0.7–4.0)
MCH: 30.8 pg (ref 26.0–34.0)
MCHC: 33.2 g/dL (ref 30.0–36.0)
MCV: 92.7 fL (ref 80.0–100.0)
Monocytes Absolute: 0.9 10*3/uL (ref 0.1–1.0)
Monocytes Relative: 8 %
Neutro Abs: 6.3 10*3/uL (ref 1.7–7.7)
Neutrophils Relative %: 57 %
PLATELETS: 281 10*3/uL (ref 150–400)
RBC: 4.81 MIL/uL (ref 4.22–5.81)
RDW: 12.6 % (ref 11.5–15.5)
WBC: 11 10*3/uL — ABNORMAL HIGH (ref 4.0–10.5)
nRBC: 0 % (ref 0.0–0.2)

## 2018-12-23 LAB — BASIC METABOLIC PANEL
Anion gap: 8 (ref 5–15)
BUN: 14 mg/dL (ref 8–23)
CO2: 24 mmol/L (ref 22–32)
CREATININE: 1.01 mg/dL (ref 0.61–1.24)
Calcium: 9 mg/dL (ref 8.9–10.3)
Chloride: 107 mmol/L (ref 98–111)
GFR calc Af Amer: >60 mL/min (ref >60)
GFR calc non Af Amer: >60 mL/min (ref >60)
Glucose, Bld: 126 mg/dL — ABNORMAL HIGH (ref 70–99)
Potassium: 4.7 mmol/L (ref 3.5–5.1)
SODIUM: 139 mmol/L (ref 135–145)

## 2018-12-23 MED ORDER — METOCLOPRAMIDE HCL 5 MG PO TABS: 5.0000 mg | ORAL_TABLET | Freq: Three times a day (TID) | ORAL | 0 refills | Status: DC

## 2018-12-23 NOTE — Progress Notes (Signed)
Patient ID: Drew Mcmahon, male   DOB: 07/30/1946, 73 y.o.   MRN: [id number]  Chief Complaint  Patient presents with  . Routine Post Op    Ventral hernia repair    HPI Drew Mcmahon is a 73 y.o. male.  Here today for post operative care for Ventral hernia.  The patient was seen at his house on postoperative day 4 and the second abdominal wall drain removed without incident.  2 days later his wife reported some nausea and 12 hours later some vomiting.  They did not notify the office until this is been going on for 2 days.  He was placed on a clear liquid diet as his wife reported he had been having normal bowel movements and his abdomen was not distended.  This was fairly well-tolerated but he still having some nausea, relieved with Phenergan.  Patient is weak and has no appetite. He is trying to drink boost, however the nausea keeps him from eating. No fever or chills. He is urinating every two hours. Bowel movements daily.  Normal in character.  HPI  Past Medical History:  Diagnosis Date  . Elevated lipids   . Hip fracture ([place]) 2007  . Hyperlipidemia   . Hypertension   . Osteopenia 07/19/2018   Sept 2019; next scan Sept 2021  . Postoperative nausea and vomiting 12/24/2018    Past Surgical History:  Procedure Laterality Date  . APPENDECTOMY  04/03/2017   Incidental APPENDECTOMY;  Surgeon: [name], MD;  Location: [facility] ORS;  Service: General;;  . BOWEL RESECTION  04/03/2017   Procedure: FOCAL SMALL BOWEL RESECTION;  Surgeon: [name], MD;  Location: [facility] ORS;  Service: General;;  . COLONOSCOPY WITH PROPOFOL N/A 06/12/2016   Procedure: COLONOSCOPY WITH PROPOFOL;  Surgeon: Manya Silvas, MD;  Location: [facility] END[facility]COPY;  Service: Endoscopy;  Laterality: N/A;  . FRACTURE SURGERY Left 2007   hip  . HOLEP-LASER ENUCLEATION OF THE PROSTATE WITH MORCELLATION N/A 06/14/2018   Procedure: HOLEP-LASER ENUCLEATION OF THE PROSTATE WITH MORCELLATION;  Surgeon: Hollice Espy, MD;   Location: [facility] ORS;  Service: Urology;  Laterality: N/A;  . LAPAROSCOPY N/A 04/01/2017   Procedure: LAPAROSCOPY DIAGNOSTIC;  Surgeon: [name], MD;  Location: [place] [facility];  Service: General;  Laterality: N/A;  . LAPAROTOMY N/A 04/03/2017   Procedure: EXPLORATORY LAPAROTOMY;  Surgeon: [name], MD;  Location: [place] [facility]S;  Service: General;  Laterality: N/A;  . LYSIS OF ADHESION  04/03/2017   Procedure: LYSIS OF ADHESION;  Surgeon: [name], MD;  Location: [facility] ORS;  Service: General;;  . VENTRAL HERNIA REPAIR N/A 12/13/2018   Procedure: HERNIA REPAIR VENTRAL ADULT WITH COMPONENT SEPARATOR MESH;  Surgeon: [name], MD;  Location: [facility] ORS;  Service: General;  Laterality: N/A;    Family History  Problem Relation Age of Onset  . Hypertension Mother   . Alzheimer's disease Mother   . Hypertension Father   . Cancer Father        lung  . Hypertension Sister   . Hypertension Brother   . Hypertension Sister   . Hypertension Sister   . Hypertension Brother   . Prostate cancer Neg Hx   . Kidney cancer Neg Hx     Social History Social History   Tobacco Use  . Smoking status: Former Smoker    Packs/day: 0.50    Years: 10.00    Pack years: 5.00    Types: Cigarettes    Last attempt to  quit: 08/26/1985    Years since quitting: 33.3  . Smokeless tobacco: Never Used  . Tobacco comment: smoking cessation materials not required  Substance Use Topics  . Alcohol use: No  . Drug use: No    No Known Allergies  Current Outpatient Medications  Medication Sig Dispense Refill  . aspirin (ASPIRIN EC) 81 MG EC tablet Take 81 mg by mouth daily. Swallow whole.    Marland Kitchen atorvastatin (LIPITOR) 20 MG tablet Take 1 tablet (20 mg total) by mouth at bedtime. 90 tablet 3  . metoprolol succinate (TOPROL-XL) 50 MG 24 hr tablet TAKE 1 TABLET BY MOUTH ONCE DAILY TAKE  WITH  OR  IMMEDIATELY  FOLLOWING  A  MEAL 90 tablet 3  . Multiple Vitamins-Minerals (ONE-A-DAY MENS VITACRAVES PO)  Take 1 tablet by mouth daily.     . promethazine (PHENERGAN) 12.5 MG tablet Take 1 tablet (12.5 mg total) by mouth every 4 (four) hours as needed for nausea or vomiting. 20 tablet 0  . triamcinolone cream (KENALOG) 0.1 % Apply 1 application topically daily as needed (for skin irritation).     . valsartan-hydrochlorothiazide (DIOVAN-HCT) 160-25 MG tablet Take 1 tablet by mouth daily.    . metoCLOPramide (REGLAN) 5 MG tablet Take 1 tablet (5 mg total) by mouth 4 (four) times daily -  before meals and at bedtime. 60 tablet 0   No current facility-administered medications for this visit.     Review of Systems Review of Systems  Constitutional: Negative.   Eyes: Negative.   Respiratory: Negative.   Cardiovascular: Negative.     Blood pressure (!) 154/90, pulse (!) 121, temperature (!) 97.2 F (36.2 C), temperature source Temporal, resp. rate 18, height 6\' 1"  (1.854 m), weight 226 lb 6.4 oz (102.7 kg), SpO2 98 %.  Physical Exam Physical Exam Cardiovascular:     Rate and Rhythm: Normal rate.     Comments: Modest tachycardia with exertion, patient has not been making use of metoprolol as prescribed.  Resting heart rate 90.  Oximetry normal. Pulmonary:     Effort: Pulmonary effort is normal.     Breath sounds: Normal breath sounds and air entry.  Abdominal:     General: Abdomen is flat. Bowel sounds are normal.     Palpations: Abdomen is soft.     Tenderness: There is no abdominal tenderness.    Neurological:     Mental Status: He is alert.     Data Reviewed 2 views of the abdomen were obtained.  No evidence of bowel obstruction.  Slightly prominent gastric bubble.  Normal transverse colon gas.  No small bowel gas.  Laboratory studies were reviewed.  CBC showed a minimal elevation of the white blood cell count of 11,000 with a normal differential.  Hemoglobin 14.8, essentially unchanged from preop.  Basic metabolic panel showed minimal elevation of the nonfasting blood sugar.   Creatinine normal at 1.01, unchanged from preop.  Assessment Possible gastric atony, no evidence of bowel obstruction based on radiologic imaging or laboratory studies.  Plan The patient was sent to the medical mall for the above-mentioned studies and return to afterward for review.  Based on review of the imaging studies and laboratory I think it is reasonable to continue outpatient management.  He will make use of Reglan, 5 mg 4 times a day.  He will continue with clear liquids with a goal of 1-1.5 quarts per day.  He may add solids as he is comfortable.  He is encouraged to call  if he develops worsening nausea or vomiting.  He may well require hospitalization if this occurs.  We will plan for a follow-up examination in 5 days.   HPI, Physical Exam, Assessment and Plan have been scribed under the direction and in the presence of [name], MD. Jonnie Finner, CMA  I have completed the exam and reviewed the above documentation for accuracy and completeness.  I agree with the above.  Haematologist has been used and any errors in dictation or transcription are unintentional.  Hervey Ard, M.D., F.A.C.S.  Forest Gleason Byrnett 12/24/2018, 8:02 AM

## 2018-12-23 NOTE — Patient Instructions (Signed)
Please go over to the medical mall for lab work and a chest xray. Once you have done the above come back to our office.

## 2018-12-23 NOTE — Progress Notes (Signed)
Patient to be sent to [facility] today to have the following labs drawn: CBC and BMP.   Also, patient to have a abdomen-2 view.   Once both are completed, he has been asked to return to the office.

## 2018-12-24 ENCOUNTER — Encounter: Payer: Self-pay | Admitting: General Surgery

## 2018-12-24 DIAGNOSIS — R112 Nausea with vomiting, unspecified: Secondary | ICD-10-CM

## 2018-12-24 DIAGNOSIS — Z9889 Other specified postprocedural states: Principal | ICD-10-CM | POA: Insufficient documentation

## 2018-12-24 HISTORY — DX: Other specified postprocedural states: R11.2

## 2018-12-24 NOTE — Discharge Summary (Signed)
Physician Discharge Summary  Patient ID: Drew Mcmahon MRN: [id number] DOB/AGE: [birth year] 73 y.o.  Admit date: 12/13/2018 Discharge date: 12/24/2018  Admission Diagnoses: Ventral hernia  Discharge Diagnoses:  Active Problems:   Recurrent ventral hernia with incarceration   Discharged Condition: good  Hospital Course: The patient was taken to the operating room the day of admission at which time he had a 10 x 12 inch piece of Bard soft mesh placed in the retrorectus space.  He ambulated early and often.  He did have abdominal bloating on postoperative day 2 that necessitated relief of the abdominal binder.  The 1 of 2 deep fascial drains was removed prior to discharge.  The patient was aggressive and pulmonary toilet.  Saturations remained good.  Blood pressure was such that he did not require both of his regular antihypertensives.  Beta-blocker was continued during the hospital stay.  At the time of discharge he was ambulating well, tolerating a soft diet well and felt to be a candidate for outpatient management requiring minimal oral narcotics.  Consults: None  Significant Diagnostic Studies: None.  Treatments: IV hydration  Discharge Exam: Blood pressure 135/70, pulse 68, temperature 98.1 F (36.7 C), temperature source Oral, resp. rate 18, height 6\' 1"  (1.854 m), weight 107.1 kg, SpO2 99 %. General appearance: alert and cooperative Resp: clear to auscultation bilaterally Cardio: regular rate and rhythm, S1, S2 normal, no murmur, click, rub or gallop GI: soft, non-tender; bowel sounds normal; no masses,  no organomegaly Incision/Wound: Midline wound healing without evidence of induration, skin ischemia or seroma.  Disposition:   Discharge Instructions    Diet - low sodium heart healthy   Complete by:  As directed    Discharge instructions   Complete by:  As directed    OK to shower. No lifting over 10 pounds. No driving. Empty and record drain 2-3x/ day.  Bring record to  office for all appointments. Tylenol: If needed for soreness. Aleve/ Advil: If needed for soreness. Norco (hydrocodone): If needed for pain. This medication may constipate. Laxative of choice if needed. Support drain during showers.   Increase activity slowly   Complete by:  As directed      Allergies as of 12/16/2018   No Known Allergies     Medication List    STOP taking these medications   valsartan-hydrochlorothiazide 160-25 MG tablet Commonly known as:  DIOVAN HCT     TAKE these medications   aspirin EC 81 MG EC tablet Generic drug:  aspirin Take 81 mg by mouth daily. Swallow whole.   atorvastatin 20 MG tablet Commonly known as:  LIPITOR Take 1 tablet (20 mg total) by mouth at bedtime.   metoprolol succinate 50 MG 24 hr tablet Commonly known as:  TOPROL-XL TAKE 1 TABLET BY MOUTH ONCE DAILY TAKE  WITH  OR  IMMEDIATELY  FOLLOWING  A  MEAL   ONE-A-DAY MENS VITACRAVES PO Take 1 tablet by mouth daily.   triamcinolone cream 0.1 % Commonly known as:  KENALOG Apply 1 application topically daily as needed (for skin irritation).      Follow-up Information    Axtyn Woehler, Forest Gleason, MD Follow up.   Specialties:  General Surgery, Radiology Why:  Office will call you with a follow-up appointment Contact information: [address] [place] [address] [phone]           Signed: [name] 12/24/2018, 10:38 AM

## 2018-12-28 ENCOUNTER — Ambulatory Visit (INDEPENDENT_AMBULATORY_CARE_PROVIDER_SITE_OTHER): Payer: Medicare Other | Admitting: General Surgery

## 2018-12-28 ENCOUNTER — Encounter: Payer: Self-pay | Admitting: General Surgery

## 2018-12-28 ENCOUNTER — Other Ambulatory Visit: Payer: Self-pay

## 2018-12-28 VITALS — BP 153/80 | HR 63 | Temp 97.7°F | Resp 18 | Ht 73.0 in | Wt 228.4 lb

## 2018-12-28 DIAGNOSIS — K439 Ventral hernia without obstruction or gangrene: Secondary | ICD-10-CM

## 2018-12-28 NOTE — Progress Notes (Signed)
Patient ID: Drew Mcmahon, male   DOB: 07-05-1946, 73 y.o.   MRN: [id number]  Chief Complaint  Patient presents with  . Routine Post Op    12/13/18 Ventral hernia repair    HPI Drew Mcmahon is a 73 y.o. male.  Here today for post operative care Ventral hernia. Patient states he feels he is improving daily. HPI  Past Medical History:  Diagnosis Date  . Elevated lipids   . Hip fracture ([place]) 2007  . Hyperlipidemia   . Hypertension   . Osteopenia 07/19/2018   Sept 2019; next scan Sept 2021  . Postoperative nausea and vomiting 12/24/2018    Past Surgical History:  Procedure Laterality Date  . APPENDECTOMY  04/03/2017   Incidental APPENDECTOMY;  Surgeon: Drew Bellow, MD;  Location: [facility] ORS;  Service: General;;  . BOWEL RESECTION  04/03/2017   Procedure: FOCAL SMALL BOWEL RESECTION;  Surgeon: Drew Bellow, MD;  Location: [facility] ORS;  Service: General;;  . COLONOSCOPY WITH PROPOFOL N/A 06/12/2016   Procedure: COLONOSCOPY WITH PROPOFOL;  Surgeon: Drew Silvas, MD;  Location: [facility] ENDOSCOPY;  Service: Endoscopy;  Laterality: N/A;  . FRACTURE SURGERY Left 2007   hip  . HOLEP-LASER ENUCLEATION OF THE PROSTATE WITH MORCELLATION N/A 06/14/2018   Procedure: HOLEP-LASER ENUCLEATION OF THE PROSTATE WITH MORCELLATION;  Surgeon: Drew Espy, MD;  Location: [facility] ORS;  Service: Urology;  Laterality: N/A;  . LAPAROSCOPY N/A 04/01/2017   Procedure: LAPAROSCOPY DIAGNOSTIC;  Surgeon: Drew Bellow, MD;  Location: [place] [facility];  Service: General;  Laterality: N/A;  . LAPAROTOMY N/A 04/03/2017   Procedure: EXPLORATORY LAPAROTOMY;  Surgeon: Drew Bellow, MD;  Location: [place] [facility]S;  Service: General;  Laterality: N/A;  . LYSIS OF ADHESION  04/03/2017   Procedure: LYSIS OF ADHESION;  Surgeon: Drew Bellow, MD;  Location: [facility] ORS;  Service: General;;  . VENTRAL HERNIA REPAIR N/A 12/13/2018   Procedure: HERNIA REPAIR VENTRAL ADULT WITH COMPONENT SEPARATOR MESH;  Surgeon: Drew Bellow, MD;  Location: [facility] ORS;  Service: General;  Laterality: N/A;    Family History  Problem Relation Age of Onset  . Hypertension Mother   . Alzheimer's disease Mother   . Hypertension Father   . Cancer Father        lung  . Hypertension Sister   . Hypertension Brother   . Hypertension Sister   . Hypertension Sister   . Hypertension Brother   . Prostate cancer Neg Hx   . Kidney cancer Neg Hx     Social History Social History   Tobacco Use  . Smoking status: Former Smoker    Packs/day: 0.50    Years: 10.00    Pack years: 5.00    Types: Cigarettes    Last attempt to quit: 08/26/1985    Years since quitting: 33.3  . Smokeless tobacco: Never Used  . Tobacco comment: smoking cessation materials not required  Substance Use Topics  . Alcohol use: No  . Drug use: No    No Known Allergies  Current Outpatient Medications  Medication Sig Dispense Refill  . aspirin (ASPIRIN EC) 81 MG EC tablet Take 81 mg by mouth daily. Swallow whole.    Marland Kitchen atorvastatin (LIPITOR) 20 MG tablet Take 1 tablet (20 mg total) by mouth at bedtime. 90 tablet 3  . metoCLOPramide (REGLAN) 5 MG tablet Take 1 tablet (5 mg total) by mouth 4 (four) times daily -  before meals and at bedtime. 60 tablet 0  .  metoprolol succinate (TOPROL-XL) 50 MG 24 hr tablet TAKE 1 TABLET BY MOUTH ONCE DAILY TAKE  WITH  OR  IMMEDIATELY  FOLLOWING  A  MEAL 90 tablet 3  . Multiple Vitamins-Minerals (ONE-A-DAY MENS VITACRAVES PO) Take 1 tablet by mouth daily.     Marland Kitchen triamcinolone cream (KENALOG) 0.1 % Apply 1 application topically daily as needed (for skin irritation).      No current facility-administered medications for this visit.     Review of Systems Review of Systems  Constitutional: Negative.   Respiratory: Negative.   Cardiovascular: Negative.     Blood pressure (!) 153/80, pulse 63, temperature 97.7 F (36.5 C), temperature source Temporal, resp. rate 18, height 6\' 1"  (1.854 m), weight 228 lb 6.4 oz (103.6 kg),  SpO2 98 %. The patient's weight is up 5 pounds from his examination 5 days ago. Tachycardia resolved with resumption of beta-blocker.  Physical Exam Physical Exam Constitutional:      Appearance: Normal appearance.  Cardiovascular:     Rate and Rhythm: Normal rate.  Pulmonary:     Breath sounds: Normal breath sounds.  Abdominal:     General: Bowel sounds are normal.     Palpations: Abdomen is soft.    Neurological:     Mental Status: He is alert.     Data Reviewed Doing well post open ventral hernia repair with retrorectus mesh placement.  Assessment The patient will increase his activity as he is comfortable.  He will avoid heavy lifting for the present.  Plan We will see you back in 3 weeks.    HPI, Physical Exam, Assessment and Plan have been scribed under the direction and in the presence of Drew Bellow, MD. Drew Mcmahon, CMA  I have completed the exam and reviewed the above documentation for accuracy and completeness.  I agree with the above.  Haematologist has been used and any errors in dictation or transcription are unintentional.  Drew Mcmahon, M.D., F.A.C.S.  Drew Mcmahon Drew Mcmahon 12/28/2018, 8:26 PM

## 2018-12-28 NOTE — Patient Instructions (Signed)
We will see you back in 3 weeks.

## 2019-01-18 ENCOUNTER — Other Ambulatory Visit: Payer: Self-pay

## 2019-01-18 ENCOUNTER — Ambulatory Visit (INDEPENDENT_AMBULATORY_CARE_PROVIDER_SITE_OTHER): Payer: Medicare Other | Admitting: General Surgery

## 2019-01-18 ENCOUNTER — Encounter: Payer: Self-pay | Admitting: General Surgery

## 2019-01-18 VITALS — BP 133/77 | HR 52 | Temp 97.2°F | Resp 16 | Ht 72.0 in | Wt 228.2 lb

## 2019-01-18 DIAGNOSIS — L578 Other skin changes due to chronic exposure to nonionizing radiation: Secondary | ICD-10-CM | POA: Diagnosis not present

## 2019-01-18 DIAGNOSIS — K439 Ventral hernia without obstruction or gangrene: Secondary | ICD-10-CM

## 2019-01-18 DIAGNOSIS — L570 Actinic keratosis: Secondary | ICD-10-CM | POA: Diagnosis not present

## 2019-01-18 DIAGNOSIS — Z1283 Encounter for screening for malignant neoplasm of skin: Secondary | ICD-10-CM | POA: Diagnosis not present

## 2019-01-18 DIAGNOSIS — I781 Nevus, non-neoplastic: Secondary | ICD-10-CM | POA: Diagnosis not present

## 2019-01-18 MED ORDER — TAMSULOSIN HCL 0.4 MG PO CAPS: 0.4000 mg | ORAL_CAPSULE | Freq: Every day | ORAL | 11 refills | Status: DC

## 2019-01-18 NOTE — Progress Notes (Signed)
Patient ID: Drew Mcmahon, male   DOB: December 07, 1945, 73 y.o.   MRN: [id number]  Chief Complaint  Patient presents with  . Routine Post Op    Ventral hernia 12/13/2018    HPI MONTEZ Mcmahon is a 73 y.o. male.  Here today for post operative care for Ventral Hernia repair 12/13/2018. Good appetite, good bowel movements.  The patient reports frequent voiding without discomfort.  Activity prompts a second or third return to the restroom.  Frequency is up, volume is down compared to preop.   HPI  Past Medical History:  Diagnosis Date  . Elevated lipids   . Hip fracture ([place]) 2007  . Hyperlipidemia   . Hypertension   . Osteopenia 07/19/2018   Sept 2019; next scan Sept 2021  . Postoperative nausea and vomiting 12/24/2018    Past Surgical History:  Procedure Laterality Date  . APPENDECTOMY  04/03/2017   Incidental APPENDECTOMY;  Surgeon: [name], MD;  Location: [facility] ORS;  Service: General;;  . BOWEL RESECTION  04/03/2017   Procedure: FOCAL SMALL BOWEL RESECTION;  Surgeon: [name], MD;  Location: [facility];  Service: General;;  . COLONOSCOPY WITH PROPOFOL N/A 06/12/2016   Procedure: COLONOSCOPY WITH PROPOFOL;  Surgeon: Manya Silvas, MD;  Location: [facility] ENDOSCOPY;  Service: Endoscopy;  Laterality: N/A;  . FRACTURE SURGERY Left 2007   hip  . HOLEP-LASER ENUCLEATION OF THE PROSTATE WITH MORCELLATION N/A 06/14/2018   Procedure: HOLEP-LASER ENUCLEATION OF THE PROSTATE WITH MORCELLATION;  Surgeon: Hollice Espy, MD;  Location: [facility] ORS;  Service: Urology;  Laterality: N/A;  . LAPAROSCOPY N/A 04/01/2017   Procedure: LAPAROSCOPY DIAGNOSTIC;  Surgeon: [name], MD;  Location: [place] [facility];  Service: General;  Laterality: N/A;  . LAPAROTOMY N/A 04/03/2017   Procedure: EXPLORATORY LAPAROTOMY;  Surgeon: [name], MD;  Location: [place] [facility]S;  Service: General;  Laterality: N/A;  . LYSIS OF ADHESION  04/03/2017   Procedure: LYSIS OF ADHESION;  Surgeon: [name], MD;   Location: [facility]S;  Service: General;;  . VENTRAL HERNIA REPAIR N/A 12/13/2018   Procedure: HERNIA REPAIR VENTRAL ADULT WITH COMPONENT SEPARATOR MESH;  Surgeon: [name], MD;  Location: [facility] ORS;  Service: General;  Laterality: N/A;    Family History  Problem Relation Age of Onset  . Hypertension Mother   . Alzheimer's disease Mother   . Hypertension Father   . Cancer Father        lung  . Hypertension Sister   . Hypertension Brother   . Hypertension Sister   . Hypertension Sister   . Hypertension Brother   . Prostate cancer Neg Hx   . Kidney cancer Neg Hx     Social History Social History   Tobacco Use  . Smoking status: Former Smoker    Packs/day: 0.50    Years: 10.00    Pack years: 5.00    Types: Cigarettes    Last attempt to quit: 08/26/1985    Years since quitting: 33.4  . Smokeless tobacco: Never Used  . Tobacco comment: smoking cessation materials not required  Substance Use Topics  . Alcohol use: No  . Drug use: No    No Known Allergies  Current Outpatient Medications  Medication Sig Dispense Refill  . aspirin (ASPIRIN EC) 81 MG EC tablet Take 81 mg by mouth daily. Swallow whole.    Marland Kitchen atorvastatin (LIPITOR) 20 MG tablet Take 1 tablet (20 mg total) by mouth at bedtime. 90 tablet 3  .  metoCLOPramide (REGLAN) 5 MG tablet Take 1 tablet (5 mg total) by mouth 4 (four) times daily -  before meals and at bedtime. 60 tablet 0  . metoprolol succinate (TOPROL-XL) 50 MG 24 hr tablet TAKE 1 TABLET BY MOUTH ONCE DAILY TAKE  WITH  OR  IMMEDIATELY  FOLLOWING  A  MEAL 90 tablet 3  . Multiple Vitamins-Minerals (ONE-A-DAY MENS VITACRAVES PO) Take 1 tablet by mouth daily.     Marland Kitchen triamcinolone cream (KENALOG) 0.1 % Apply 1 application topically daily as needed (for skin irritation).     . valsartan-hydrochlorothiazide (DIOVAN-HCT) 160-25 MG tablet Take 1 tablet by mouth daily.    . tamsulosin (FLOMAX) 0.4 MG CAPS capsule Take 1 capsule (0.4 mg total) by mouth daily. 30  capsule 11   No current facility-administered medications for this visit.     Review of Systems Review of Systems  Constitutional: Negative.   Respiratory: Negative.   Cardiovascular: Negative.     Blood pressure 133/77, pulse (!) 52, temperature (!) 97.2 F (36.2 C), temperature source Temporal, resp. rate 16, height 6' (1.829 m), weight 228 lb 3.2 oz (103.5 kg), SpO2 98 %.  Physical Exam Physical Exam Pulmonary:     Effort: Pulmonary effort is normal.     Breath sounds: Normal breath sounds.  Abdominal:    Neurological:     Mental Status: He is alert.       Assessment Doing well now 5 weeks status post component separation repair with placement of retrorectus mesh.  Likely recurrent prostate symptoms after Foley trauma.    Plan  We will see you back in 2 months.  (Prior to Mar 13, 2019). The patient will resume Flomax 0.4 mg daily.  He has been asked to give a phone follow-up in 2 weeks. Please call our office if you have questions or concerns. No lifting or pushing over 20 pounds. No core exercises at this time.  Please pick up medication at the pharmacy.     HPI, Physical Exam, Assessment and Plan have been scribed under the direction and in the presence of [name], MD. Jonnie Finner, CMA  I have completed the exam and reviewed the above documentation for accuracy and completeness.  I agree with the above.  Haematologist has been used and any errors in dictation or transcription are unintentional.  Hervey Ard, M.D., F.A.C.S.  Forest Gleason Dinh Ayotte 01/18/2019, 2:03 PM

## 2019-01-18 NOTE — Patient Instructions (Addendum)
We will see you back in 2 months.  Please call our office if you have questions or concerns. No lifting or pushing over 20 pounds. No core exercises at this time.  Please pick up medication at the pharmacy.

## 2019-03-08 ENCOUNTER — Ambulatory Visit (INDEPENDENT_AMBULATORY_CARE_PROVIDER_SITE_OTHER): Payer: Medicare Other | Admitting: General Surgery

## 2019-03-08 ENCOUNTER — Encounter: Payer: Self-pay | Admitting: General Surgery

## 2019-03-08 ENCOUNTER — Other Ambulatory Visit: Payer: Self-pay

## 2019-03-08 VITALS — BP 151/72 | HR 47 | Temp 97.2°F | Resp 16 | Ht 73.0 in | Wt 227.4 lb

## 2019-03-08 DIAGNOSIS — K439 Ventral hernia without obstruction or gangrene: Secondary | ICD-10-CM

## 2019-03-08 NOTE — Progress Notes (Signed)
Patient ID: Drew Mcmahon, male   DOB: [birth year], 73 y.o.   MRN: [id number]  Chief Complaint  Patient presents with  . Routine Post Op    ventral hernia repair    HP Drew Mcmahon is a 73 y.o. male.  Here today for post operative care Ventral hernia repair. No complaints.  HPI  Past Medical History:  Diagnosis Date  . Elevated lipids   . Hip fracture ([place]) 2007  . Hyperlipidemia   . Hypertension   . Osteopenia 07/19/2018   Sept 2019; next scan Sept 2021  . Postoperative nausea and vomiting 12/24/2018    Past Surgical History:  Procedure Laterality Date  . APPENDECTOMY  04/03/2017   Incidental APPENDECTOMY;  Surgeon: [name], MD;  Location: [facility]S;  Service: General;;  . BOWEL RESECTION  04/03/2017   Procedure: FOCAL SMALL BOWEL RESECTION;  Surgeon: [name], MD;  Location: [facility];  Service: General;;  . COLONOSCOPY WITH PROPOFOL N/A 06/12/2016   Procedure: COLONOSCOPY WITH PROPOFOL;  Surgeon: Manya Silvas, MD;  Location: [facility] ENDOSCOPY;  Service: Endoscopy;  Laterality: N/A;  . FRACTURE SURGERY Left 2007   hip  . HOLEP-LASER ENUCLEATION OF THE PROSTATE WITH MORCELLATION N/A 06/14/2018   Procedure: HOLEP-LASER ENUCLEATION OF THE PROSTATE WITH MORCELLATION;  Surgeon: Hollice Espy, MD;  Location: [facility] ORS;  Service: Urology;  Laterality: N/A;  . LAPAROSCOPY N/A 04/01/2017   Procedure: LAPAROSCOPY DIAGNOSTIC;  Surgeon: [name], MD;  Location: [place] [facility]S;  Service: General;  Laterality: N/A;  . LAPAROTOMY N/A 04/03/2017   Procedure: EXPLORATORY LAPAROTOMY;  Surgeon: [name], MD;  Location: [place] [facility]S;  Service: General;  Laterality: N/A;  . LYSIS OF ADHESION  04/03/2017   Procedure: LYSIS OF ADHESION;  Surgeon: [name], MD;  Location: [facility]S;  Service: General;;  . VENTRAL HERNIA REPAIR N/A 12/13/2018   Procedure: HERNIA REPAIR VENTRAL ADULT WITH COMPONENT SEPARATOR MESH;  Surgeon: [name], MD;  Location: [facility]S;   Service: General;  Laterality: N/A;    Family History  Problem Relation Age of Onset  . Hypertension Mother   . Alzheimer's disease Mother   . Hypertension Father   . Cancer Father        lung  . Hypertension Sister   . Hypertension Brother   . Hypertension Sister   . Hypertension Sister   . Hypertension Brother   . Prostate cancer Neg Hx   . Kidney cancer Neg Hx     Social History Social History   Tobacco Use  . Smoking status: Former Smoker    Packs/day: 0.50    Years: 10.00    Pack years: 5.00    Types: Cigarettes    Last attempt to quit: 08/26/1985    Years since quitting: 33.5  . Smokeless tobacco: Never Used  . Tobacco comment: smoking cessation materials not required  Substance Use Topics  . Alcohol use: No  . Drug use: No    No Known Allergies  Current Outpatient Medications  Medication Sig Dispense Refill  . aspirin (ASPIRIN EC) 81 MG EC tablet Take 81 mg by mouth daily. Swallow whole.    Marland Kitchen atorvastatin (LIPITOR) 20 MG tablet Take 1 tablet (20 mg total) by mouth at bedtime. 90 tablet 3  . metoCLOPramide (REGLAN) 5 MG tablet Take 1 tablet (5 mg total) by mouth 4 (four) times daily -  before meals and at bedtime. 60 tablet 0  . metoprolol succinate (TOPROL-XL) 50 MG  24 hr tablet TAKE 1 TABLET BY MOUTH ONCE DAILY TAKE  WITH  OR  IMMEDIATELY  FOLLOWING  A  MEAL 90 tablet 3  . Multiple Vitamins-Minerals (ONE-A-DAY MENS VITACRAVES PO) Take 1 tablet by mouth daily.     . tamsulosin (FLOMAX) 0.4 MG CAPS capsule Take 1 capsule (0.4 mg total) by mouth daily. 30 capsule 11  . triamcinolone cream (KENALOG) 0.1 % Apply 1 application topically daily as needed (for skin irritation).     . valsartan-hydrochlorothiazide (DIOVAN-HCT) 160-25 MG tablet Take 1 tablet by mouth daily.     No current facility-administered medications for this visit.     Review of Systems Review of Systems  Constitutional: Negative.   Respiratory: Negative.   Cardiovascular: Negative.      Blood pressure (!) 151/72, pulse (!) 47, temperature (!) 97.2 F (36.2 C), temperature source Temporal, resp. rate 16, height 6\' 1"  (1.854 m), weight 227 lb 6.4 oz (103.1 kg), SpO2 98 %.  Physical Exam Physical Exam Abdominal:        Assessment Doing well post component separation repair of ventral hernia.  Plan The patient was instructed on proper lifting technique.  He was encouraged to call if he has any questions or concerns.   HPI, Physical Exam, Assessment and Plan have been scribed under the direction and in the presence of [name], MD. Jonnie Finner, CMA  I have completed the exam and reviewed the above documentation for accuracy and completeness.  I agree with the above.  Haematologist has been used and any errors in dictation or transcription are unintentional.  Hervey Ard, M.D., F.A.C.S.  Forest Gleason Windi Toro 03/09/2019, 1:34 PM

## 2019-03-08 NOTE — Patient Instructions (Signed)
Please call our office with any questions or concerns. 

## 2019-03-29 ENCOUNTER — Telehealth: Payer: Self-pay | Admitting: Urology

## 2019-03-29 MED ORDER — SULFAMETHOXAZOLE-TRIMETHOPRIM 800-160 MG PO TABS: 1.0000 | ORAL_TABLET | Freq: Two times a day (BID) | ORAL | 0 refills | Status: DC

## 2019-03-29 NOTE — Telephone Encounter (Signed)
Pt started having burning on Sunday.  Pt is also having pain in his left testicle.  He wanted to know if Dr Erlene Quan would consider prescribing last RX he took for UTI, without them coming in.  Please call wife, Edd Fabian [phone]

## 2019-03-29 NOTE — Telephone Encounter (Signed)
Please advise 

## 2019-03-29 NOTE — Telephone Encounter (Signed)
Yes, please let him know that is an exception in light of covid.  Bactrim sent to pharmacy.   Hollice Espy, MD

## 2019-03-30 NOTE — Telephone Encounter (Signed)
.  mychart

## 2019-03-31 ENCOUNTER — Telehealth: Payer: Self-pay | Admitting: Urology

## 2019-03-31 NOTE — Telephone Encounter (Signed)
Error

## 2019-04-05 ENCOUNTER — Other Ambulatory Visit: Payer: Self-pay

## 2019-04-05 ENCOUNTER — Encounter: Payer: Self-pay | Admitting: Urology

## 2019-04-05 ENCOUNTER — Ambulatory Visit: Payer: Medicare Other | Admitting: Urology

## 2019-04-05 VITALS — BP 136/70 | HR 61 | Ht 73.0 in | Wt 225.0 lb

## 2019-04-05 DIAGNOSIS — N453 Epididymo-orchitis: Secondary | ICD-10-CM

## 2019-04-05 DIAGNOSIS — R399 Unspecified symptoms and signs involving the genitourinary system: Secondary | ICD-10-CM | POA: Diagnosis not present

## 2019-04-05 LAB — URINALYSIS, COMPLETE
Bilirubin, UA: NEGATIVE
Glucose, UA: NEGATIVE
Ketones, UA: NEGATIVE
Leukocytes,UA: NEGATIVE
Nitrite, UA: NEGATIVE
Protein,UA: NEGATIVE
RBC, UA: NEGATIVE
Specific Gravity, UA: 1.02 (ref 1.005–1.030)
Urobilinogen, Ur: 0.2 mg/dL (ref 0.2–1.0)
pH, UA: 6 (ref 5.0–7.5)

## 2019-04-05 LAB — MICROSCOPIC EXAMINATION
Bacteria, UA: NONE SEEN
Epithelial Cells (non renal): NONE SEEN /hpf (ref 0–10)
RBC, Urine: NONE SEEN /hpf (ref 0–2)

## 2019-04-05 NOTE — Progress Notes (Signed)
04/05/2019 3:58 PM   Drew Mcmahon 1945-12-17 [id number]  Referring provider: Arnetha Courser, MD [address] Drew Mcmahon,  Drew Mcmahon [id number]  Chief Complaint  Patient presents with  . Groin Swelling    HPI: 73 year old male who presents today for follow-up of testicular pain.  He called our office last week complaining of dysuria, urgency frequency and new onset of left testicular pain.  In light of COVID-19, a prescription for Bactrim was called in for presumed UTI and possible left epididymoorchitis.  He began taking this medication shortly after it was called in on 03/29/2019.  He reports that his urinary symptoms have improved/resolved.  He primarily returns today to the office because his left testicle continue to enlarge for a few days actually worsened over the weekend.  Since then, he is been wearing tight underwear with scrotal support and has had some improvement in his discomfort as well as the size of the testicle.  No fevers or chills.   PMH: Past Medical History:  Diagnosis Date  . Elevated lipids   . Hip fracture ([place]) 2007  . Hyperlipidemia   . Hypertension   . Osteopenia 07/19/2018   Sept 2019; next scan Sept 2021  . Postoperative nausea and vomiting 12/24/2018    Surgical History: Past Surgical History:  Procedure Laterality Date  . APPENDECTOMY  04/03/2017   Incidental APPENDECTOMY;  Surgeon: [name], MD;  Location: [facility] ORS;  Service: General;;  . BOWEL RESECTION  04/03/2017   Procedure: FOCAL SMALL BOWEL RESECTION;  Surgeon: [name], MD;  Location: [facility]S;  Service: General;;  . COLONOSCOPY WITH PROPOFOL N/A 06/12/2016   Procedure: COLONOSCOPY WITH PROPOFOL;  Surgeon: Manya Silvas, MD;  Location: [facility] ENDOSCOPY;  Service: Endoscopy;  Laterality: N/A;  . FRACTURE SURGERY Left 2007   hip  . HOLEP-LASER ENUCLEATION OF THE PROSTATE WITH MORCELLATION N/A 06/14/2018   Procedure: HOLEP-LASER ENUCLEATION OF THE PROSTATE WITH  MORCELLATION;  Surgeon: Hollice Espy, MD;  Location: [facility]S;  Service: Urology;  Laterality: N/A;  . LAPAROSCOPY N/A 04/01/2017   Procedure: LAPAROSCOPY DIAGNOSTIC;  Surgeon: [name], MD;  Location: [place];  Service: General;  Laterality: N/A;  . LAPAROTOMY N/A 04/03/2017   Procedure: EXPLORATORY LAPAROTOMY;  Surgeon: [name], MD;  Location: [place] ORS;  Service: General;  Laterality: N/A;  . LYSIS OF ADHESION  04/03/2017   Procedure: LYSIS OF ADHESION;  Surgeon: [name], MD;  Location: [facility] ORS;  Service: General;;  . VENTRAL HERNIA REPAIR N/A 12/13/2018   Procedure: HERNIA REPAIR VENTRAL ADULT WITH COMPONENT SEPARATOR MESH;  Surgeon: [name], MD;  Location: [facility] ORS;  Service: General;  Laterality: N/A;    Home Medications:  Allergies as of 04/05/2019   No Known Allergies     Medication List       Accurate as of April 05, 2019 11:59 PM. If you have any questions, ask your nurse or doctor.        aspirin EC 81 MG EC tablet Generic drug: aspirin Take 81 mg by mouth daily. Swallow whole.   atorvastatin 20 MG tablet Commonly known as: LIPITOR Take 1 tablet (20 mg total) by mouth at bedtime.   metoCLOPramide 5 MG tablet Commonly known as: Reglan Take 1 tablet (5 mg total) by mouth 4 (four) times daily -  before meals and at bedtime.   metoprolol succinate 50 MG 24 hr tablet Commonly known as: TOPROL-XL TAKE 1 TABLET BY MOUTH  ONCE DAILY TAKE  WITH  OR  IMMEDIATELY  FOLLOWING  A  MEAL   ONE-A-DAY MENS VITACRAVES PO Take 1 tablet by mouth daily.   sulfamethoxazole-trimethoprim 800-160 MG tablet Commonly known as: BACTRIM DS Take 1 tablet by mouth every 12 (twelve) hours.   tamsulosin 0.4 MG Caps capsule Commonly known as: Flomax Take 1 capsule (0.4 mg total) by mouth daily.   triamcinolone cream 0.1 % Commonly known as: KENALOG Apply 1 application topically daily as needed (for skin irritation).   valsartan-hydrochlorothiazide  160-25 MG tablet Commonly known as: DIOVAN-HCT Take 1 tablet by mouth daily.       Allergies: No Known Allergies  Family History: Family History  Problem Relation Age of Onset  . Hypertension Mother   . Alzheimer's disease Mother   . Hypertension Father   . Cancer Father        lung  . Hypertension Sister   . Hypertension Brother   . Hypertension Sister   . Hypertension Sister   . Hypertension Brother   . Prostate cancer Neg Hx   . Kidney cancer Neg Hx     Social History:  reports that he quit smoking about 33 years ago. His smoking use included cigarettes. He has a 5.00 pack-year smoking history. He has never used smokeless tobacco. He reports that he does not drink alcohol or use drugs.  ROS: 12 point review systems was performed is negative other than as per HPI.  Physical Exam: BP 136/70   Pulse 61   Ht 6\' 1"  (1.854 m)   Wt 225 lb (102.1 kg)   BMI 29.69 kg/m   Constitutional:  Alert and oriented, No acute distress. HEENT: [place] AT, moist mucus membranes.  Trachea midline, no masses. Cardiovascular: No clubbing, cyanosis, or edema. Respiratory: Normal respiratory effort, no increased work of breathing. GI: Abdomen is soft, nontender, nondistended, no abdominal masses.  Midline abdominal incision well-healed with no further hernia. GU: Normal phallus.  Bilateral descended testicles, left testicle is mildly enlarged and firm with enlarged firm epididymis however nontender and no palpable masses.  Right testicle normal. Skin: No rashes, bruises or suspicious lesions. Neurologic: Grossly intact, no focal deficits, moving all 4 extremities. Psychiatric: Normal mood and affect.  Laboratory Data: Lab Results  Component Value Date   WBC 11.0 (H) 12/23/2018   HGB 14.8 12/23/2018   HCT 44.6 12/23/2018   MCV 92.7 12/23/2018   PLT 281 12/23/2018    Lab Results  Component Value Date   CREATININE 1.01 12/23/2018   Lab Results  Component Value Date   HGBA1C 6.1 (H)  12/01/2018    Urinalysis Results for orders placed or performed in visit on 04/05/19  Microscopic Examination   URINE  Result Value Ref Range   WBC, UA 0-5 0 - 5 /hpf   RBC None seen 0 - 2 /hpf   Epithelial Cells (non renal) None seen 0 - 10 /hpf   Bacteria, UA None seen None seen/Few  Urinalysis, Complete  Result Value Ref Range   Specific Gravity, UA 1.020 1.005 - 1.030   pH, UA 6.0 5.0 - 7.5   Color, UA Yellow Yellow   Appearance Ur Clear Clear   Leukocytes,UA Negative Negative   Protein,UA Negative Negative/Trace   Glucose, UA Negative Negative   Ketones, UA Negative Negative   RBC, UA Negative Negative   Bilirubin, UA Negative Negative   Urobilinogen, Ur 0.2 0.2 - 1.0 mg/dL   Nitrite, UA Negative Negative   Microscopic Examination  See below:    Assessment & Plan:    1. Orchitis and epididymitis Suspected left epididymoorchitis with some initial worsening and now improvement on Bactrim Given improvement over the past several days, recommended continue supportive care with NSAIDs as tolerated, scrotal support, ice and elevation Advised to return if his symptoms fail to resolve completely or begin to worsen again He is agreeable this plan  2. UTI symptoms Resolved on antibiotics UA today is reassuring Presumed recent UTI with epididymoorchitis, no UA/urine culture for confirmation - Urinalysis, Complete  Hollice Espy, MD  [place] [address], [place], [place] [address] [phone]

## 2019-04-14 ENCOUNTER — Other Ambulatory Visit: Payer: Self-pay

## 2019-04-14 ENCOUNTER — Encounter: Payer: Self-pay | Admitting: General Surgery

## 2019-04-14 ENCOUNTER — Ambulatory Visit (INDEPENDENT_AMBULATORY_CARE_PROVIDER_SITE_OTHER): Payer: Medicare Other | Admitting: General Surgery

## 2019-04-14 VITALS — BP 171/81 | HR 56 | Temp 97.2°F | Ht 73.0 in | Wt 226.4 lb

## 2019-04-14 DIAGNOSIS — R21 Rash and other nonspecific skin eruption: Secondary | ICD-10-CM

## 2019-04-14 NOTE — Progress Notes (Signed)
Patient ID: Drew Mcmahon, male   DOB: [birth year], 73 y.o.   MRN: [id number]  Chief Complaint  Patient presents with  . Follow-up    patient concerned about scar patient had surgery ventral hernia repair Dr. Bary Castilla 12/13/18    HPI Drew Mcmahon is a 73 y.o. male.  Here for evaluation of his wife's concerns about scar patient. The area has a scab and is red for 2 weeks. Denies fever, chills or drainage. He does admit to itching at the area, using moisturizing cream. He had surgery ventral hernia repair Dr. Bary Castilla 12/13/18.  HPI  Past Medical History:  Diagnosis Date  . Elevated lipids   . Hip fracture ([place]) 2007  . Hyperlipidemia   . Hypertension   . Osteopenia 07/19/2018   Sept 2019; next scan Sept 2021  . Postoperative nausea and vomiting 12/24/2018    Past Surgical History:  Procedure Laterality Date  . APPENDECTOMY  04/03/2017   Incidental APPENDECTOMY;  Surgeon: [name], MD;  Location: [facility] ORS;  Service: General;;  . BOWEL RESECTION  04/03/2017   Procedure: FOCAL SMALL BOWEL RESECTION;  Surgeon: [name], MD;  Location: [facility] ORS;  Service: General;;  . COLONOSCOPY WITH PROPOFOL N/A 06/12/2016   Procedure: COLONOSCOPY WITH PROPOFOL;  Surgeon: Manya Silvas, MD;  Location: [facility] ENDOSCOPY;  Service: Endoscopy;  Laterality: N/A;  . FRACTURE SURGERY Left 2007   hip  . HOLEP-LASER ENUCLEATION OF THE PROSTATE WITH MORCELLATION N/A 06/14/2018   Procedure: HOLEP-LASER ENUCLEATION OF THE PROSTATE WITH MORCELLATION;  Surgeon: Hollice Espy, MD;  Location: [facility] ORS;  Service: Urology;  Laterality: N/A;  . LAPAROSCOPY N/A 04/01/2017   Procedure: LAPAROSCOPY DIAGNOSTIC;  Surgeon: [name], MD;  Location: [place] ORS;  Service: General;  Laterality: N/A;  . LAPAROTOMY N/A 04/03/2017   Procedure: EXPLORATORY LAPAROTOMY;  Surgeon: [name], MD;  Location: [place] [facility] ORS;  Service: General;  Laterality: N/A;  . LYSIS OF ADHESION  04/03/2017   Procedure: LYSIS OF  ADHESION;  Surgeon: [name], MD;  Location: [facility]S;  Service: General;;  . VENTRAL HERNIA REPAIR N/A 12/13/2018   Procedure: HERNIA REPAIR VENTRAL ADULT WITH COMPONENT SEPARATOR MESH;  Surgeon: [name], MD;  Location: [facility] ORS;  Service: General;  Laterality: N/A;    Family History  Problem Relation Age of Onset  . Hypertension Mother   . Alzheimer's disease Mother   . Hypertension Father   . Cancer Father        lung  . Hypertension Sister   . Hypertension Brother   . Hypertension Sister   . Hypertension Sister   . Hypertension Brother   . Prostate cancer Neg Hx   . Kidney cancer Neg Hx     Social History Social History   Tobacco Use  . Smoking status: Former Smoker    Packs/day: 0.50    Years: 10.00    Pack years: 5.00    Types: Cigarettes    Quit date: 08/26/1985    Years since quitting: 33.6  . Smokeless tobacco: Never Used  . Tobacco comment: smoking cessation materials not required  Substance Use Topics  . Alcohol use: No  . Drug use: No    No Known Allergies  Current Outpatient Medications  Medication Sig Dispense Refill  . aspirin (ASPIRIN EC) 81 MG EC tablet Take 81 mg by mouth daily. Swallow whole.    Marland Kitchen atorvastatin (LIPITOR) 20 MG tablet Take 1 tablet (20 mg total) by mouth  at bedtime. 90 tablet 3  . metoprolol succinate (TOPROL-XL) 50 MG 24 hr tablet TAKE 1 TABLET BY MOUTH ONCE DAILY TAKE  WITH  OR  IMMEDIATELY  FOLLOWING  A  MEAL 90 tablet 3  . Multiple Vitamins-Minerals (ONE-A-DAY MENS VITACRAVES PO) Take 1 tablet by mouth daily.     . tamsulosin (FLOMAX) 0.4 MG CAPS capsule Take 1 capsule (0.4 mg total) by mouth daily. 30 capsule 11  . triamcinolone cream (KENALOG) 0.1 % Apply 1 application topically daily as needed (for skin irritation).     . valsartan-hydrochlorothiazide (DIOVAN-HCT) 160-25 MG tablet Take 1 tablet by mouth daily.     No current facility-administered medications for this visit.     Review of Systems Review  of Systems  Constitutional: Negative.   Respiratory: Negative.   Cardiovascular: Negative.     Blood pressure (!) 171/81, pulse (!) 56, temperature (!) 97.2 F (36.2 C), temperature source Temporal, height 6\' 1"  (1.854 m), weight 226 lb 6.4 oz (102.7 kg), SpO2 98 %.  Physical Exam Physical Exam Constitutional:      Appearance: Normal appearance.  Abdominal:     Palpations: Abdomen is soft.       Comments: Irritated skin  Skin:    General: Skin is warm and dry.  Neurological:     Mental Status: He is alert and oriented to person, place, and time.  Psychiatric:        Behavior: Behavior normal.       Assessment Rash along inferior wound.   Plan  Patient has Kenalog 0.1% cream at home. To use on the area twice a day.  Phone update in one week.   HPI, assessment, plan and physical exam has been scribed under the direction and in the presence of [name], MD. Karie Fetch, RN  I have completed the exam and reviewed the above documentation for accuracy and completeness.  I agree with the above.  Haematologist has been used and any errors in dictation or transcription are unintentional.  Hervey Ard, M.D., F.A.C.S.  Forest Gleason Charlie Char 04/14/2019, 9:21 AM

## 2019-04-14 NOTE — Patient Instructions (Addendum)
The patient is aware to call back for any questions or new concerns. Use kenalog cream to the area twice a day

## 2019-04-25 ENCOUNTER — Ambulatory Visit: Payer: Self-pay | Admitting: Family Medicine

## 2019-04-25 ENCOUNTER — Telehealth: Payer: Self-pay | Admitting: General Surgery

## 2019-04-25 NOTE — Telephone Encounter (Signed)
Patient is calling and just has some questions for the nurse. Please call patient and advise.

## 2019-04-25 NOTE — Telephone Encounter (Signed)
Spoke with patients wife. Her son who lives with her and her husband tested positive for covid, complained of sore throat and fever.  Patients wife was instructed to self quarantine and to call PCP for further instructions.

## 2019-04-25 NOTE — Telephone Encounter (Signed)
Pt.'s son who live with him has tested positive for COVID 19. Pt. has a cough and sore throat. Request testing.  Answer Assessment - Initial Assessment Questions 1. CLOSE CONTACT: "Who is the person with the confirmed or suspected COVID-19 infection that you were exposed to?"     Son 2. PLACE of CONTACT: "Where were you when you were exposed to COVID-19?" (e.g., home, school, medical waiting room; which city?)     Home 3. TYPE of CONTACT: "How much contact was there?" (e.g., sitting next to, live in same house, work in same office, same building)     Lives together 4. DURATION of CONTACT: "How long were you in contact with the COVID-19 patient?" (e.g., a few seconds, passed by person, a few minutes, live with the patient)     Current 5. DATE of CONTACT: "When did you have contact with a COVID-19 patient?" (e.g., how many days ago)     Current 6. TRAVEL: "Have you traveled out of the country recently?" If so, "When and where?"     * Also ask about out-of-state travel, since the CDC has identified some high-risk cities for community spread in the [place].     * Note: Travel becomes less relevant if there is widespread community transmission where the patient lives.     No  7. COMMUNITY SPREAD: "Are there lots of cases of COVID-19 (community spread) where you live?" (See public health department website, if unsure)       Yes 8. SYMPTOMS: "Do you have any symptoms?" (e.g., fever, cough, breathing difficulty)     Cough and sore throat 9. PREGNANCY OR POSTPARTUM: "Is there any chance you are pregnant?" "When was your last menstrual period?" "Did you deliver in the last 2 weeks?"     n/a 10. HIGH RISK: "Do you have any heart or lung problems? Do you have a weak immune system?" (e.g., CHF, COPD, asthma, HIV positive, chemotherapy, renal failure, diabetes mellitus, sickle cell anemia)       HTN  Protocols used: CORONAVIRUS (COVID-19) EXPOSURE-A-AH

## 2019-04-27 ENCOUNTER — Encounter: Payer: Self-pay | Admitting: Nurse Practitioner

## 2019-04-27 ENCOUNTER — Ambulatory Visit (INDEPENDENT_AMBULATORY_CARE_PROVIDER_SITE_OTHER): Payer: Medicare Other | Admitting: Nurse Practitioner

## 2019-04-27 VITALS — BP 132/70 | HR 55 | Ht 73.0 in | Wt 220.0 lb

## 2019-04-27 DIAGNOSIS — R059 Cough, unspecified: Secondary | ICD-10-CM

## 2019-04-27 DIAGNOSIS — Z20828 Contact with and (suspected) exposure to other viral communicable diseases: Secondary | ICD-10-CM | POA: Diagnosis not present

## 2019-04-27 DIAGNOSIS — R6889 Other general symptoms and signs: Secondary | ICD-10-CM | POA: Diagnosis not present

## 2019-04-27 DIAGNOSIS — J029 Acute pharyngitis, unspecified: Secondary | ICD-10-CM | POA: Diagnosis not present

## 2019-04-27 DIAGNOSIS — I700 Atherosclerosis of aorta: Secondary | ICD-10-CM

## 2019-04-27 DIAGNOSIS — E785 Hyperlipidemia, unspecified: Secondary | ICD-10-CM

## 2019-04-27 DIAGNOSIS — R05 Cough: Secondary | ICD-10-CM

## 2019-04-27 DIAGNOSIS — Z20822 Contact with and (suspected) exposure to covid-19: Secondary | ICD-10-CM

## 2019-04-27 DIAGNOSIS — I10 Essential (primary) hypertension: Secondary | ICD-10-CM | POA: Diagnosis not present

## 2019-04-27 DIAGNOSIS — R7303 Prediabetes: Secondary | ICD-10-CM

## 2019-04-27 NOTE — Progress Notes (Signed)
Virtual Visit via Video Note  I connected with Drew Mcmahon on 04/27/19 at  2:40 PM EDT by a video enabled telemedicine application and verified that I am speaking with the correct person using two identifiers.   Staff discussed the limitations of evaluation and management by telemedicine and the availability of in person appointments. The patient expressed understanding and agreed to proceed.  Patient location: home  My location: work office Other people present:  Wife- gayle Anzaldo HPI  Positive COVID19 exposure- son who lives with them is positive.  Patient states his symptoms started on 04/20/2019, were worse 3 days ago but of slowly been improving. Symptoms: cough, congestion, sore throat No fever, or shortness of breath, chest pain, diarrhea.   Hypertension Patient is taking valsartan 160 mg and HCTZ 25 mg and metoprolol 50 mg daily with no missed doses.  Denies chest pain, blurry vision, dizziness, lightheadedness, headaches. He had an elevated blood pressure when it was checked at the general surgeon last week for rash at hernia repair site-he states it is typically elevated when he goes to doctors because of whitecoat syndrome but he checks it at home and is usually well controlled in the 1 19-3 40 systolic range. Had scab at hernia repair site and redness at site- used cream and has resolved.  Patient has aortic calcification is LDL that was last checked 4 months ago and was 74 he takes atorvastatin 20 mg daily and aspirin 81 mg.  Last A1c checked was 6.1% 4 months ago.  Denies polyphagia polydipsia polyuria. Lab Results  Component Value Date   CHOL 142 12/01/2018   HDL 51 12/01/2018   LDLCALC 74 12/01/2018   TRIG 88 12/01/2018   CHOLHDL 2.8 12/01/2018    PHQ2/9: Depression screen [facility] 2/9 04/27/2019 12/01/2018 06/29/2018 05/31/2018 12/01/2016  Decreased Interest 0 0 0 0 0  Down, Depressed, Hopeless 0 0 0 0 0  PHQ - 2 Score 0 0 0 0 0  Altered sleeping 0 0 0 - -  Tired, decreased  energy 0 0 0 - -  Change in appetite 0 0 0 - -  Feeling bad or failure about yourself  0 0 0 - -  Trouble concentrating 0 0 0 - -  Moving slowly or fidgety/restless 0 0 0 - -  Suicidal thoughts 0 0 0 - -  PHQ-9 Score 0 0 0 - -  Difficult doing work/chores - Not difficult at all Not difficult at all - -     PHQ reviewed. Negative  Patient Active Problem List   Diagnosis Date Noted  . Prediabetes 12/02/2018  . Osteopenia 07/19/2018  . History of hip fracture 07/04/2018  . Hyperglycemia 06/01/2018  . Obesity (BMI 30.0-34.9) 05/31/2018  . Diverticulosis of colon 05/31/2018  . Medication monitoring encounter 05/31/2018  . Aortic calcification ([place]) 05/31/2018  . Ventral hernia without obstruction or gangrene 05/27/2018  . Urinary tract infection without hematuria   . Spinal stenosis at L4-L5 level 12/01/2016  . Contact dermatitis 09/10/2015  . BPH (benign prostatic hyperplasia) 08/27/2015  . Peripheral neuropathy 08/27/2015  . Hyperlipidemia   . Hypertension     Past Medical History:  Diagnosis Date  . Elevated lipids   . Hip fracture ([place]) 2007  . Hyperlipidemia   . Hypertension   . Osteopenia 07/19/2018   Sept 2019; next scan Sept 2021  . Postoperative nausea and vomiting 12/24/2018    Past Surgical History:  Procedure Laterality Date  . APPENDECTOMY  04/03/2017   Incidental  APPENDECTOMY;  Surgeon: [name], MD;  Location: [facility] ORS;  Service: General;;  . BOWEL RESECTION  04/03/2017   Procedure: FOCAL SMALL BOWEL RESECTION;  Surgeon: [name], MD;  Location: [facility] ORS;  Service: General;;  . COLONOSCOPY WITH PROPOFOL N/A 06/12/2016   Procedure: COLONOSCOPY WITH PROPOFOL;  Surgeon: Manya Silvas, MD;  Location: [facility] ENDOSCOPY;  Service: Endoscopy;  Laterality: N/A;  . FRACTURE SURGERY Left 2007   hip  . HOLEP-LASER ENUCLEATION OF THE PROSTATE WITH MORCELLATION N/A 06/14/2018   Procedure: HOLEP-LASER ENUCLEATION OF THE PROSTATE WITH MORCELLATION;   Surgeon: Hollice Espy, MD;  Location: [facility] ORS;  Service: Urology;  Laterality: N/A;  . LAPAROSCOPY N/A 04/01/2017   Procedure: LAPAROSCOPY DIAGNOSTIC;  Surgeon: [name], MD;  Location: [place] [facility];  Service: General;  Laterality: N/A;  . LAPAROTOMY N/A 04/03/2017   Procedure: EXPLORATORY LAPAROTOMY;  Surgeon: [name], MD;  Location: [place] [facility];  Service: General;  Laterality: N/A;  . LYSIS OF ADHESION  04/03/2017   Procedure: LYSIS OF ADHESION;  Surgeon: [name], MD;  Location: [facility] ORS;  Service: General;;  . VENTRAL HERNIA REPAIR N/A 12/13/2018   Procedure: HERNIA REPAIR VENTRAL ADULT WITH COMPONENT SEPARATOR MESH;  Surgeon: [name], MD;  Location: [facility] ORS;  Service: General;  Laterality: N/A;    Social History   Tobacco Use  . Smoking status: Former Smoker    Packs/day: 0.50    Years: 10.00    Pack years: 5.00    Types: Cigarettes    Quit date: 08/26/1985    Years since quitting: 33.6  . Smokeless tobacco: Never Used  . Tobacco comment: smoking cessation materials not required  Substance Use Topics  . Alcohol use: No     Current Outpatient Medications:  .  aspirin (ASPIRIN EC) 81 MG EC tablet, Take 81 mg by mouth daily. Swallow whole., Disp: , Rfl:  .  atorvastatin (LIPITOR) 20 MG tablet, Take 1 tablet (20 mg total) by mouth at bedtime., Disp: 90 tablet, Rfl: 3 .  metoprolol succinate (TOPROL-XL) 50 MG 24 hr tablet, TAKE 1 TABLET BY MOUTH ONCE DAILY TAKE  WITH  OR  IMMEDIATELY  FOLLOWING  A  MEAL, Disp: 90 tablet, Rfl: 3 .  Multiple Vitamins-Minerals (ONE-A-DAY MENS VITACRAVES PO), Take 1 tablet by mouth daily. , Disp: , Rfl:  .  tamsulosin (FLOMAX) 0.4 MG CAPS capsule, Take 1 capsule (0.4 mg total) by mouth daily., Disp: 30 capsule, Rfl: 11 .  triamcinolone cream (KENALOG) 0.1 %, Apply 1 application topically daily as needed (for skin irritation). , Disp: , Rfl:  .  valsartan-hydrochlorothiazide (DIOVAN-HCT) 160-25 MG tablet, Take 1 tablet  by mouth daily., Disp: , Rfl:   No Known Allergies  ROS   No other specific complaints in a complete review of systems (except as listed in HPI above).  Objective  Vitals:   04/27/19 1420  BP: 132/70  Pulse: (!) 55  SpO2: 98%  Weight: 220 lb (99.8 kg)  Height: 6\' 1"  (1.854 m)     Body mass index is 29.03 kg/m.  Nursing Note and Vital Signs reviewed.  Physical Exam   Constitutional: Patient appears well-developed and well-nourished. No distress.  HENT: Head: Normocephalic and atraumatic. Cardiovascular: slow rate- baseline Pulmonary/Chest: Effort normal  Musculoskeletal: Normal range of motion,  Neurological: alert and oriented, speech normal.  Skin: No rash noted. No erythema.  Psychiatric: Patient has a normal mood and affect. behavior is normal. Judgment and thought content  normal.    Assessment & Plan  1. Exposure to Covid-19 Virus   2. Suspected Covid-19 Virus Infection Recommended testing today however patient and her husband likely do have COVID as they have had close frequent exposure with son and home having positive symptoms and her and her husband exhibiting symptoms as well.  Discussed OTC treatment patient dates she additionally thinks she does have COVID but does not want testing as that would not change anything for right now with treatment plan.  Discussed ER precautions and importance of self-isolation and when she can end self-isolation  3. Cough Recommend OTC Mucinex DM  4. Sore throat Recommend saline gargles and soft call can provide Magic mouthwash if needed  5. Essential hypertension Stable continue medications  6. Hyperlipidemia, unspecified hyperlipidemia type Stable continue medications  7. Aortic calcification (HCC) Blood pressure and lipid control  8. Prediabetes Healthy eating    Follow Up Instructions:   3 months  I discussed the assessment and treatment plan with the patient. The patient was provided an opportunity  to ask questions and all were answered. The patient agreed with the plan and demonstrated an understanding of the instructions.   The patient was advised to call back or seek an in-person evaluation if the symptoms worsen or if the condition fails to improve as anticipated.  I provided 26 minutes of non-face-to-face time during this encounter.   Fredderick Severance, NP

## 2019-05-02 ENCOUNTER — Ambulatory Visit: Payer: Self-pay | Admitting: Family Medicine

## 2019-05-02 DIAGNOSIS — Z20822 Contact with and (suspected) exposure to covid-19: Secondary | ICD-10-CM

## 2019-05-02 NOTE — Telephone Encounter (Signed)
Scheduled patient for COVID 19 test tomorrow at 10 am at [facility].  Testing protocol reviewed.

## 2019-05-02 NOTE — Addendum Note (Signed)
Addended by: Denyce Robert on: 05/02/2019 12:10 PM   Modules accepted: Orders

## 2019-05-02 NOTE — Telephone Encounter (Signed)
Pt. Was seen last week with COVID 19 symptoms. Would like to be tested now. Please advise pt.

## 2019-05-02 NOTE — Telephone Encounter (Signed)
Recommend covid testing  Positive COVID19 exposure- son who lives with them is positive.   Symptoms: cough, congestion, sore throat Risk factors: age, hypertension

## 2019-05-03 ENCOUNTER — Other Ambulatory Visit: Payer: Medicare Other

## 2019-05-03 DIAGNOSIS — R6889 Other general symptoms and signs: Secondary | ICD-10-CM | POA: Diagnosis not present

## 2019-05-03 DIAGNOSIS — Z20822 Contact with and (suspected) exposure to covid-19: Secondary | ICD-10-CM

## 2019-05-08 LAB — NOVEL CORONAVIRUS, NAA: SARS-CoV-2, NAA: NOT DETECTED

## 2019-06-01 ENCOUNTER — Ambulatory Visit: Payer: Medicare Other | Admitting: Family Medicine

## 2019-07-01 ENCOUNTER — Ambulatory Visit: Payer: Medicare Other

## 2019-07-29 ENCOUNTER — Other Ambulatory Visit: Payer: Self-pay

## 2019-07-29 ENCOUNTER — Ambulatory Visit (INDEPENDENT_AMBULATORY_CARE_PROVIDER_SITE_OTHER): Payer: Medicare Other | Admitting: Family Medicine

## 2019-07-29 ENCOUNTER — Encounter: Payer: Self-pay | Admitting: Family Medicine

## 2019-07-29 VITALS — BP 136/70 | HR 69 | Temp 97.2°F | Resp 16 | Ht 73.0 in | Wt 220.8 lb

## 2019-07-29 DIAGNOSIS — I10 Essential (primary) hypertension: Secondary | ICD-10-CM | POA: Diagnosis not present

## 2019-07-29 DIAGNOSIS — R7303 Prediabetes: Secondary | ICD-10-CM | POA: Diagnosis not present

## 2019-07-29 DIAGNOSIS — K439 Ventral hernia without obstruction or gangrene: Secondary | ICD-10-CM

## 2019-07-29 DIAGNOSIS — Z23 Encounter for immunization: Secondary | ICD-10-CM | POA: Diagnosis not present

## 2019-07-29 DIAGNOSIS — E663 Overweight: Secondary | ICD-10-CM | POA: Diagnosis not present

## 2019-07-29 DIAGNOSIS — E785 Hyperlipidemia, unspecified: Secondary | ICD-10-CM

## 2019-07-29 DIAGNOSIS — N138 Other obstructive and reflux uropathy: Secondary | ICD-10-CM

## 2019-07-29 DIAGNOSIS — N401 Enlarged prostate with lower urinary tract symptoms: Secondary | ICD-10-CM

## 2019-07-29 DIAGNOSIS — K5000 Crohn's disease of small intestine without complications: Secondary | ICD-10-CM | POA: Insufficient documentation

## 2019-07-29 MED ORDER — ATORVASTATIN CALCIUM 20 MG PO TABS: 20.0000 mg | ORAL_TABLET | Freq: Every day | ORAL | 3 refills | Status: DC

## 2019-07-29 MED ORDER — METOPROLOL SUCCINATE ER 50 MG PO TB24: ORAL_TABLET | ORAL | 3 refills | Status: DC

## 2019-07-29 MED ORDER — VALSARTAN-HYDROCHLOROTHIAZIDE 160-25 MG PO TABS: 1.0000 | ORAL_TABLET | Freq: Every day | ORAL | 3 refills | Status: DC

## 2019-07-29 NOTE — Progress Notes (Signed)
Name: Drew Mcmahon   MRN: [id number]    DOB: 05-Apr-1946   Date:07/29/2019       Progress Note  Subjective  Chief Complaint  Chief Complaint  Patient presents with  . Follow-up    3 month follow up    HPI  HTN: Pt notes some white coat syndrome at the office; at home his BP's have been 110's/70-80's. -does take medications as prescribed - current regimen includes Valsartan-HCTZ 160-25; Metoprolol XL 50mg .  - taking medications as instructed, no medication side effects noted, no TIAs, no chest pain on exertion, no dyspnea on exertion, no swelling of ankles, no orthostatic dizziness or lightheadedness, no orthopnea or paroxysmal nocturnal dyspnea - DASH diet discussed - pt does follow a low sodium diet  HLD: Taking lipitor 20mg  and 81mg  ASA daily; no myalgias, chest pain, shortness of breath.  Prediabetes/Overweight: Last A1C 6.1%; walks about every other day; eats balanced diet. Denies polyuria, polydipsia, or polyphagia.  Body mass index is 29.13 kg/m.  BPH: No longer taking flomax because he has very minimal symptoms.  Denies nocturia, minimally decreased force of stream.  Crohn's Disease: Listed on his chart, but states has never had any issues or any knowledge of this diagnosis.  He did have ventral hernia without obstruction back in February that was repaired by Dr. Bary Castilla.  Has been doing well in this.   Patient Active Problem List   Diagnosis Date Noted  . Prediabetes 12/02/2018  . Osteopenia 07/19/2018  . Obesity (BMI 30.0-34.9) 05/31/2018  . Diverticulosis of colon 05/31/2018  . Aortic calcification ([place]) 05/31/2018  . Ventral hernia without obstruction or gangrene 05/27/2018  . Urinary tract infection without hematuria   . Spinal stenosis at L4-L5 level 12/01/2016  . BPH (benign prostatic hyperplasia) 08/27/2015  . Peripheral neuropathy 08/27/2015  . Hyperlipidemia   . Hypertension     Past Surgical History:  Procedure Laterality Date  . APPENDECTOMY   04/03/2017   Incidental APPENDECTOMY;  Surgeon: [name], MD;  Location: [facility] ORS;  Service: General;;  . BOWEL RESECTION  04/03/2017   Procedure: FOCAL SMALL BOWEL RESECTION;  Surgeon: [name], MD;  Location: [facility] ORS;  Service: General;;  . COLONOSCOPY WITH PROPOFOL N/A 06/12/2016   Procedure: COLONOSCOPY WITH PROPOFOL;  Surgeon: Manya Silvas, MD;  Location: [facility] ENDOSCOPY;  Service: Endoscopy;  Laterality: N/A;  . FRACTURE SURGERY Left 2007   hip  . HOLEP-LASER ENUCLEATION OF THE PROSTATE WITH MORCELLATION N/A 06/14/2018   Procedure: HOLEP-LASER ENUCLEATION OF THE PROSTATE WITH MORCELLATION;  Surgeon: Hollice Espy, MD;  Location: [facility] ORS;  Service: Urology;  Laterality: N/A;  . LAPAROSCOPY N/A 04/01/2017   Procedure: LAPAROSCOPY DIAGNOSTIC;  Surgeon: [name], MD;  Location: [place] [facility];  Service: General;  Laterality: N/A;  . LAPAROTOMY N/A 04/03/2017   Procedure: EXPLORATORY LAPAROTOMY;  Surgeon: [name], MD;  Location: [place] [facility];  Service: General;  Laterality: N/A;  . LYSIS OF ADHESION  04/03/2017   Procedure: LYSIS OF ADHESION;  Surgeon: [name], MD;  Location: [facility] ORS;  Service: General;;  . VENTRAL HERNIA REPAIR N/A 12/13/2018   Procedure: HERNIA REPAIR VENTRAL ADULT WITH COMPONENT SEPARATOR MESH;  Surgeon: [name], MD;  Location: [facility] ORS;  Service: General;  Laterality: N/A;    Family History  Problem Relation Age of Onset  . Hypertension Mother   . Alzheimer's disease Mother   . Hypertension Father   . Cancer Father  lung  . Hypertension Sister   . Hypertension Brother   . Hypertension Sister   . Hypertension Sister   . Hypertension Brother   . Prostate cancer Neg Hx   . Kidney cancer Neg Hx     Social History   Socioeconomic History  . Marital status: Married    Spouse name: Edd Fabian  . Number of children: 3  . Years of education: Not on file  . Highest education level: 12th grade  Occupational  History  . Occupation: Retired  Scientific laboratory technician  . Financial resource strain: Not hard at all  . Food insecurity    Worry: Never true    Inability: Never true  . Transportation needs    Medical: No    Non-medical: No  Tobacco Use  . Smoking status: Former Smoker    Packs/day: 0.50    Years: 10.00    Pack years: 5.00    Types: Cigarettes    Quit date: 08/26/1985    Years since quitting: 33.9  . Smokeless tobacco: Never Used  . Tobacco comment: smoking cessation materials not required  Substance and Sexual Activity  . Alcohol use: No  . Drug use: No  . Sexual activity: Not Currently  Lifestyle  . Physical activity    Days per week: 0 days    Minutes per session: 0 min  . Stress: Not at all  Relationships  . Social Herbalist on phone: Patient refused    Gets together: Patient refused    Attends religious service: Patient refused    Active member of club or organization: Patient refused    Attends meetings of clubs or organizations: Patient refused    Relationship status: Married  . Intimate partner violence    Fear of current or ex partner: No    Emotionally abused: No    Physically abused: No    Forced sexual activity: No  Other Topics Concern  . Not on file  Social History Narrative  . Not on file     Current Outpatient Medications:  .  aspirin (ASPIRIN EC) 81 MG EC tablet, Take 81 mg by mouth daily. Swallow whole., Disp: , Rfl:  .  atorvastatin (LIPITOR) 20 MG tablet, Take 1 tablet (20 mg total) by mouth at bedtime., Disp: 90 tablet, Rfl: 3 .  metoprolol succinate (TOPROL-XL) 50 MG 24 hr tablet, TAKE 1 TABLET BY MOUTH ONCE DAILY TAKE  WITH  OR  IMMEDIATELY  FOLLOWING  A  MEAL, Disp: 90 tablet, Rfl: 3 .  Multiple Vitamins-Minerals (ONE-A-DAY MENS VITACRAVES PO), Take 1 tablet by mouth daily. , Disp: , Rfl:  .  triamcinolone cream (KENALOG) 0.1 %, Apply 1 application topically daily as needed (for skin irritation). , Disp: , Rfl:  .   valsartan-hydrochlorothiazide (DIOVAN-HCT) 160-25 MG tablet, Take 1 tablet by mouth daily., Disp: , Rfl:  .  tamsulosin (FLOMAX) 0.4 MG CAPS capsule, Take 1 capsule (0.4 mg total) by mouth daily. (Patient not taking: Reported on 07/29/2019), Disp: 30 capsule, Rfl: 11  No Known Allergies  I personally reviewed active problem list, medication list, allergies, health maintenance, notes from last encounter, lab results with the patient/caregiver today.   ROS  Constitutional: Negative for fever or weight change.  Respiratory: Negative for cough and shortness of breath.   Cardiovascular: Negative for chest pain or palpitations.  Gastrointestinal: Negative for abdominal pain, no bowel changes.  Musculoskeletal: Negative for gait problem or joint swelling.  Skin: Negative for rash.  Neurological:  Negative for dizziness or headache.  No other specific complaints in a complete review of systems (except as listed in HPI above).  Objective  Vitals:   07/29/19 0742  BP: 136/70  Pulse: 69  Resp: 16  Temp: (!) 97.2 F (36.2 C)  SpO2: 99%  Weight: 220 lb 12.8 oz (100.2 kg)  Height: 6\' 1"  (1.854 m)    Body mass index is 29.13 kg/m.  Physical Exam  Constitutional: Patient appears well-developed and well-nourished. No distress.  HENT: Head: Normocephalic and atraumatic. Eyes: Conjunctivae and EOM are normal. No scleral icterus.  Neck: Normal range of motion. Neck supple. No JVD present. Cardiovascular: Normal rate, regular rhythm and normal heart sounds.  No murmur heard. No BLE edema. Pulmonary/Chest: Effort normal and breath sounds normal. No respiratory distress. Musculoskeletal: Normal range of motion, no joint effusions. No gross deformities Neurological: Pt is alert and oriented to person, place, and time. No cranial nerve deficit. Coordination, balance, strength, speech and gait are normal.  Skin: Skin is warm and dry. No rash noted. No erythema.  Psychiatric: Patient has a normal  mood and affect. behavior is normal. Judgment and thought content normal.  No results found for this or any previous visit (from the past 72 hour(s)).  PHQ2/9: Depression screen [facility] 2/9 07/29/2019 04/27/2019 12/01/2018 06/29/2018 05/31/2018  Decreased Interest 3 0 0 0 0  Down, Depressed, Hopeless 0 0 0 0 0  PHQ - 2 Score 3 0 0 0 0  Altered sleeping 0 0 0 0 -  Tired, decreased energy 0 0 0 0 -  Change in appetite 0 0 0 0 -  Feeling bad or failure about yourself  0 0 0 0 -  Trouble concentrating 0 0 0 0 -  Moving slowly or fidgety/restless 0 0 0 0 -  Suicidal thoughts 0 0 0 0 -  PHQ-9 Score 3 0 0 0 -  Difficult doing work/chores Not difficult at all - Not difficult at all Not difficult at all -   PHQ-2/9 Result is negative.    Fall Risk: Fall Risk  07/29/2019 04/27/2019 04/14/2019 03/08/2019 01/18/2019  Falls in the past year? 0 0 0 0 0  Number falls in past yr: 0 0 - - -  Injury with Fall? 0 0 - - -  Risk for fall due to : - - - - -  Risk for fall due to: Comment - - - - -  Follow up - - Falls evaluation completed - -    Functional Status Survey: Is the patient deaf or have difficulty hearing?: Yes Does the patient have difficulty seeing, even when wearing glasses/contacts?: No Does the patient have difficulty concentrating, remembering, or making decisions?: No Does the patient have difficulty walking or climbing stairs?: No Does the patient have difficulty dressing or bathing?: No Does the patient have difficulty doing errands alone such as visiting a doctor's office or shopping?: No   Assessment & Plan  1. Essential hypertension - valsartan-hydrochlorothiazide (DIOVAN-HCT) 160-25 MG tablet; Take 1 tablet by mouth daily.  Dispense: 90 tablet; Refill: 3 - metoprolol succinate (TOPROL-XL) 50 MG 24 hr tablet; TAKE 1 TABLET BY MOUTH ONCE DAILY TAKE  WITH  OR  IMMEDIATELY  FOLLOWING  A  MEAL  Dispense: 90 tablet; Refill: 3 - COMPLETE METABOLIC PANEL WITH GFR  2. Hyperlipidemia,  unspecified hyperlipidemia type - atorvastatin (LIPITOR) 20 MG tablet; Take 1 tablet (20 mg total) by mouth at bedtime.  Dispense: 90 tablet; Refill: 3 - Lipid  panel  3. Prediabetes - COMPLETE METABOLIC PANEL WITH GFR - Hemoglobin A1c  4. Overweight (BMI 25.0-29.9) - Lipid panel - COMPLETE METABOLIC PANEL WITH GFR - Hemoglobin A1c  5. Benign prostatic hyperplasia with urinary obstruction - Off flomax; doing well  6. Ventral hernia without obstruction or gangrene - Stable after surgical repair  7. Flu vaccine need - Flu Vaccine QUAD High Dose(Fluad)

## 2019-07-30 LAB — COMPLETE METABOLIC PANEL WITH GFR
AG Ratio: 1.4 (calc) (ref 1.0–2.5)
ALT: 12 U/L (ref 9–46)
AST: 17 U/L (ref 10–35)
Albumin: 3.8 g/dL (ref 3.6–5.1)
Alkaline phosphatase (APISO): 94 U/L (ref 35–144)
BUN: 21 mg/dL (ref 7–25)
CO2: 32 mmol/L (ref 20–32)
Calcium: 9.4 mg/dL (ref 8.6–10.3)
Chloride: 104 mmol/L (ref 98–110)
Creat: 1.03 mg/dL (ref 0.70–1.18)
GFR, Est African American: 84 mL/min/{1.73_m2} (ref >=60)
GFR, Est Non African American: 72 mL/min/{1.73_m2} (ref >=60)
Globulin: 2.7 g/dL (calc) (ref 1.9–3.7)
Glucose, Bld: 119 mg/dL — ABNORMAL HIGH (ref 65–99)
Potassium: 4 mmol/L (ref 3.5–5.3)
Sodium: 140 mmol/L (ref 135–146)
Total Bilirubin: 0.9 mg/dL (ref 0.2–1.2)
Total Protein: 6.5 g/dL (ref 6.1–8.1)

## 2019-07-30 LAB — LIPID PANEL
Cholesterol: 137 mg/dL (ref <200)
HDL: 51 mg/dL (ref >=40)
LDL Cholesterol (Calc): 72 mg/dL (calc)
Non-HDL Cholesterol (Calc): 86 mg/dL (calc) (ref <130)
Total CHOL/HDL Ratio: 2.7 (calc) (ref <5.0)
Triglycerides: 67 mg/dL (ref <150)

## 2019-07-30 LAB — HEMOGLOBIN A1C
Hgb A1c MFr Bld: 5.7 % of total Hgb — ABNORMAL HIGH (ref <5.7)
Mean Plasma Glucose: 117 (calc)
eAG (mmol/L): 6.5 (calc)

## 2019-08-22 ENCOUNTER — Telehealth: Payer: Self-pay | Admitting: Family Medicine

## 2019-08-22 NOTE — Chronic Care Management (AMB) (Signed)
Chronic Care Management   Note  08/22/2019 Name: DEBRA CALABRETTA MRN: [id number] DOB: Mar 07, 1946  TORE CARREKER is a 73 y.o. year old male who is a primary care patient of Lada, Satira Anis, MD. I reached out to [name] by phone today in response to a referral sent by Mr. Naveen Clardy [employer] health plan.     Mr. Kiel was given information about Chronic Care Management services today including:  1. CCM service includes personalized support from designated clinical staff supervised by his physician, including individualized plan of care and coordination with other care providers 2. 24/7 contact phone numbers for assistance for urgent and routine care needs. 3. Service will only be billed when office clinical staff spend 20 minutes or more in a month to coordinate care. 4. Only one practitioner may furnish and bill the service in a calendar month. 5. The patient may stop CCM services at any time (effective at the end of the month) by phone call to the office staff. 6. The patient will be responsible for cost sharing (co-pay) of up to 20% of the service fee (after annual deductible is met).  Patient agreed to services and verbal consent obtained.   Follow up plan: Telephone appointment with CCM team member scheduled for: 09/16/2019  [place]  ??[email or web address]   ??[phone]

## 2019-09-16 ENCOUNTER — Telehealth: Payer: Medicare Other

## 2019-09-16 ENCOUNTER — Ambulatory Visit: Payer: Self-pay

## 2019-09-16 NOTE — Chronic Care Management (AMB) (Signed)
  Chronic Care Management   Outreach Note  09/16/2019 Name: SHAW DIRICKSON MRN: [id number] DOB: [birth year]  Referred by: Health Plan Reason for referral : Chronic Care Management   An unsuccessful telephone outreach was attempted today. Mr. Butta was referred to the case management team for assistance with care management and care coordination. His phone rang multiple times without an option to leave a voice message.    Follow Up Plan: The care management team will reach out to Mr. Spross next month.   [place] [phone]

## 2019-10-10 ENCOUNTER — Telehealth: Payer: Self-pay

## 2019-10-24 ENCOUNTER — Telehealth: Payer: Self-pay

## 2019-10-24 ENCOUNTER — Ambulatory Visit: Payer: Self-pay

## 2019-10-24 NOTE — Chronic Care Management (AMB) (Signed)
  Chronic Care Management   Outreach Note  10/24/2019 Name: Drew Mcmahon MRN: [id number] DOB: 05/12/1946   Reason for referral : Chronic Care Management   A second unsuccessful telephone outreach was attempted today. Drew Mcmahon was referred to the case management team for assistance with care management and care coordination.     Follow Up Plan: The care management team will reach out to Drew Mcmahon again within the next two to three weeks.    [place] Care [facility] [phone]

## 2019-10-31 ENCOUNTER — Other Ambulatory Visit: Payer: Medicare Other

## 2019-11-01 ENCOUNTER — Ambulatory Visit: Payer: Medicare Other | Admitting: Urology

## 2019-11-14 ENCOUNTER — Ambulatory Visit: Payer: Self-pay

## 2019-11-14 ENCOUNTER — Telehealth: Payer: Self-pay

## 2019-11-14 NOTE — Chronic Care Management (AMB) (Signed)
  Chronic Care Management   Outreach Note  11/14/2019 Name: Drew Mcmahon MRN: [id number] DOB: 1945-11-01  Referred by: Health Plan Reason for referral : Chronic Care Management   Third unsuccessful telephone outreach was attempted today. Drew Mcmahon was referred to the care management team for assistance with chronic care management and care coordination. Per Epic, he has not established care with a new primary care provider since Dr. Delight Ovens departure. The care management team will gladly outreach at any time in the future if he is interested in receiving assistance.    Follow Up Plan:  The care management team will gladly follow up with Drew Mcmahon after he is evaluated by his primary care provider and has a conversation regarding recommendation for care management engagement.    [facility] Care Management [phone]

## 2019-12-19 ENCOUNTER — Encounter: Payer: Self-pay | Admitting: Family Medicine

## 2019-12-19 ENCOUNTER — Other Ambulatory Visit: Payer: Self-pay

## 2019-12-19 ENCOUNTER — Ambulatory Visit (INDEPENDENT_AMBULATORY_CARE_PROVIDER_SITE_OTHER): Payer: Medicare Other | Admitting: Family Medicine

## 2019-12-19 VITALS — BP 156/84 | HR 55 | Temp 98.1°F | Ht 71.5 in | Wt 223.0 lb

## 2019-12-19 DIAGNOSIS — N138 Other obstructive and reflux uropathy: Secondary | ICD-10-CM

## 2019-12-19 DIAGNOSIS — R7303 Prediabetes: Secondary | ICD-10-CM

## 2019-12-19 DIAGNOSIS — E785 Hyperlipidemia, unspecified: Secondary | ICD-10-CM

## 2019-12-19 DIAGNOSIS — Z7689 Persons encountering health services in other specified circumstances: Secondary | ICD-10-CM | POA: Diagnosis not present

## 2019-12-19 DIAGNOSIS — N401 Enlarged prostate with lower urinary tract symptoms: Secondary | ICD-10-CM

## 2019-12-19 DIAGNOSIS — I10 Essential (primary) hypertension: Secondary | ICD-10-CM | POA: Diagnosis not present

## 2019-12-19 NOTE — Assessment & Plan Note (Signed)
UTD on labs, diet controlled. Continue good lifestyle habits

## 2019-12-19 NOTE — Assessment & Plan Note (Signed)
Stable, continue to monitor  ?

## 2019-12-19 NOTE — Assessment & Plan Note (Signed)
Stable and well controlled, continue current regimen and good lifestyle habits

## 2019-12-19 NOTE — Assessment & Plan Note (Signed)
High in office, but home BP checks consistently WNL. Continue to monitor closely and call with persistent abnormal readings. Pt declines adding medication at this time. Will recheck at upcoming [place]

## 2019-12-19 NOTE — Progress Notes (Signed)
BP (!) 156/84   Pulse (!) 55   Temp 98.1 F (36.7 C) (Oral)   Ht 5' 11.5" (1.816 m)   Wt 223 lb (101.2 kg)   SpO2 96%   BMI 30.67 kg/m    Subjective:    Patient ID: Drew Mcmahon, male    DOB: June 11, 1946, 74 y.o.   MRN: [id number]  HPI: Drew Mcmahon is a 74 y.o. male  Chief Complaint  Patient presents with  . Establish Care   Patient presenting today to establish care.   HTN - Home BPs running 130s/70s when checked at home. Taking medications faithfully without side effects. Denies CP, SOB, HAs, dizziness.   IFG - diet controlled, feels things are going well there. Eating healthy and staying active. No polyuria, polydipsia, polyphagia, hypoglycemia.  BPH - Followed by Urology, current stable off medications.   HLD - Taking lipitor, tolerating well without side effects. Denies claudication, myalgias.   Relevant past medical, surgical, family and social history reviewed and updated as indicated. Interim medical history since our last visit reviewed. Allergies and medications reviewed and updated.  Review of Systems  Per HPI unless specifically indicated above     Objective:    BP (!) 156/84   Pulse (!) 55   Temp 98.1 F (36.7 C) (Oral)   Ht 5' 11.5" (1.816 m)   Wt 223 lb (101.2 kg)   SpO2 96%   BMI 30.67 kg/m   Wt Readings from Last 3 Encounters:  12/19/19 223 lb (101.2 kg)  07/29/19 220 lb 12.8 oz (100.2 kg)  04/27/19 220 lb (99.8 kg)    Physical Exam Vitals and nursing note reviewed.  Constitutional:      Appearance: Normal appearance.  HENT:     Head: Atraumatic.  Eyes:     Extraocular Movements: Extraocular movements intact.     Conjunctiva/sclera: Conjunctivae normal.  Cardiovascular:     Rate and Rhythm: Normal rate and regular rhythm.  Pulmonary:     Effort: Pulmonary effort is normal.     Breath sounds: Normal breath sounds.  Musculoskeletal:        General: Normal range of motion.     Cervical back: Normal range of motion and neck  supple.  Skin:    General: Skin is warm and dry.  Neurological:     General: No focal deficit present.     Mental Status: He is oriented to person, place, and time.  Psychiatric:        Mood and Affect: Mood normal.        Thought Content: Thought content normal.        Judgment: Judgment normal.     Results for orders placed or performed in visit on 07/29/19  Lipid panel  Result Value Ref Range   Cholesterol 137 <200 mg/dL   HDL 51 > OR = 40 mg/dL   Triglycerides 67 <150 mg/dL   LDL Cholesterol (Calc) 72 mg/dL (calc)   Total CHOL/HDL Ratio 2.7 <5.0 (calc)   Non-HDL Cholesterol (Calc) 86 <130 mg/dL (calc)  COMPLETE METABOLIC PANEL WITH GFR  Result Value Ref Range   Glucose, Bld 119 (H) 65 - 99 mg/dL   BUN 21 7 - 25 mg/dL   Creat 1.03 0.70 - 1.18 mg/dL   GFR, Est Non African American 72 > OR = 60 mL/min/1.25m2   GFR, Est African American 84 > OR = 60 mL/min/1.59m2   BUN/Creatinine Ratio NOT APPLICABLE 6 - 22 (calc)   Sodium  140 135 - 146 mmol/L   Potassium 4.0 3.5 - 5.3 mmol/L   Chloride 104 98 - 110 mmol/L   CO2 32 20 - 32 mmol/L   Calcium 9.4 8.6 - 10.3 mg/dL   Total Protein 6.5 6.1 - 8.1 g/dL   Albumin 3.8 3.6 - 5.1 g/dL   Globulin 2.7 1.9 - 3.7 g/dL (calc)   AG Ratio 1.4 1.0 - 2.5 (calc)   Total Bilirubin 0.9 0.2 - 1.2 mg/dL   Alkaline phosphatase (APISO) 94 35 - 144 U/L   AST 17 10 - 35 U/L   ALT 12 9 - 46 U/L  Hemoglobin A1c  Result Value Ref Range   Hgb A1c MFr Bld 5.7 (H) <5.7 % of total Hgb   Mean Plasma Glucose 117 (calc)   eAG (mmol/L) 6.5 (calc)      Assessment & Plan:   Problem List Items Addressed This Visit      Cardiovascular and Mediastinum   Hypertension - Primary (Chronic)    High in office, but home BP checks consistently WNL. Continue to monitor closely and call with persistent abnormal readings. Pt declines adding medication at this time. Will recheck at upcoming OV        Genitourinary   BPH (benign prostatic hyperplasia) (Chronic)     Stable, continue to monitor        Other   Hyperlipidemia (Chronic)    Stable and well controlled, continue current regimen and good lifestyle habits      Prediabetes    UTD on labs, diet controlled. Continue good lifestyle habits       Other Visit Diagnoses    Encounter to establish care           Follow up plan: Return in about 2 months (around 02/16/2020) for CPE.

## 2020-01-13 ENCOUNTER — Other Ambulatory Visit: Payer: Self-pay | Admitting: *Deleted

## 2020-01-13 ENCOUNTER — Other Ambulatory Visit: Payer: Medicare Other

## 2020-01-13 ENCOUNTER — Other Ambulatory Visit: Payer: Self-pay

## 2020-01-13 DIAGNOSIS — R972 Elevated prostate specific antigen [PSA]: Secondary | ICD-10-CM

## 2020-01-14 LAB — PSA: Prostate Specific Ag, Serum: 0.6 ng/mL (ref 0.0–4.0)

## 2020-01-17 NOTE — Progress Notes (Signed)
01/18/20 4:26 PM   Drew Mcmahon 12-Jul-1946 [id number]  Referring provider: Arnetha Courser, MD No address on file  Chief Complaint  Patient presents with  . Benign Prostatic Hypertrophy    HPI: .Drew Mcmahon is a 74 y.o. white M with history of BPH and outlet obstruction and refractory obstructive urinary symptoms s/p homium laser enucleation of the prostate on 06/14/18 presents today for a 1 year f/u for symptom recheck.   Surgical pathology consistent with benign hyperplasia. 41 g of tissue were resected. Preop TRUS volume 57 g with a median lobe, small.  His most recent PSA was 0.6 from 01/13/20.   He reports of good urine stream and incontinence once in while however not bothersome.   He reports of difficulties with achieving an erection which he attributes to his age. He denies any cardiac issues.    IPSS    Row Name 01/18/20 1500         International Prostate Symptom Score   How often have you had the sensation of not emptying your bladder?  Less than 1 in 5     How often have you had to urinate less than every two hours?  Less than 1 in 5 times     How often have you found you stopped and started again several times when you urinated?  Not at All     How often have you found it difficult to postpone urination?  Not at All     How often have you had a weak urinary stream?  Not at All     How often have you had to strain to start urination?  Not at All     How many times did you typically get up at night to urinate?  1 Time     Total IPSS Score  3       Quality of Life due to urinary symptoms   If you were to spend the rest of your life with your urinary condition just the way it is now how would you feel about that?  Delighted        Score:  1-7 Mild 8-19 Moderate 20-35 Severe  PMH: Past Medical History:  Diagnosis Date  . Elevated lipids   . Hip fracture ([place]) 2007  . History of hip fracture 07/04/2018  . Hyperlipidemia   . Hypertension   .  Osteopenia 07/19/2018   Sept 2019; next scan Sept 2021  . Postoperative nausea and vomiting 12/24/2018    Surgical History: Past Surgical History:  Procedure Laterality Date  . APPENDECTOMY  04/03/2017   Incidental APPENDECTOMY;  Surgeon: [name], MD;  Location: [facility] ORS;  Service: General;;  . BOWEL RESECTION  04/03/2017   Procedure: FOCAL SMALL BOWEL RESECTION;  Surgeon: [name], MD;  Location: [facility] ORS;  Service: General;;  . COLONOSCOPY WITH PROPOFOL N/A 06/12/2016   Procedure: COLONOSCOPY WITH PROPOFOL;  Surgeon: Manya Silvas, MD;  Location: [facility];  Service: Endoscopy;  Laterality: N/A;  . FRACTURE SURGERY Left 2007   hip  . HOLEP-LASER ENUCLEATION OF THE PROSTATE WITH MORCELLATION N/A 06/14/2018   Procedure: HOLEP-LASER ENUCLEATION OF THE PROSTATE WITH MORCELLATION;  Surgeon: Hollice Espy, MD;  Location: [facility] ORS;  Service: Urology;  Laterality: N/A;  . LAPAROSCOPY N/A 04/01/2017   Procedure: LAPAROSCOPY DIAGNOSTIC;  Surgeon: [name], MD;  Location: [facility] ORS;  Service: General;  Laterality: N/A;  . LAPAROTOMY N/A 04/03/2017   Procedure: EXPLORATORY LAPAROTOMY;  Surgeon: [name], MD;  Location: [place] ORS;  Service: General;  Laterality: N/A;  . LYSIS OF ADHESION  04/03/2017   Procedure: LYSIS OF ADHESION;  Surgeon: [name], MD;  Location: [facility] ORS;  Service: General;;  . VENTRAL HERNIA REPAIR N/A 12/13/2018   Procedure: HERNIA REPAIR VENTRAL ADULT WITH COMPONENT SEPARATOR MESH;  Surgeon: [name], MD;  Location: [facility] ORS;  Service: General;  Laterality: N/A;    Home Medications:  Allergies as of 01/18/2020   No Known Allergies     Medication List       Accurate as of January 18, 2020 11:59 PM. If you have any questions, ask your nurse or doctor.        aspirin EC 81 MG EC tablet Generic drug: aspirin Take 81 mg by mouth daily. Swallow whole.   atorvastatin 20 MG tablet Commonly known as: LIPITOR Take 1  tablet (20 mg total) by mouth at bedtime.   metoprolol succinate 50 MG 24 hr tablet Commonly known as: TOPROL-XL TAKE 1 TABLET BY MOUTH ONCE DAILY TAKE  WITH  OR  IMMEDIATELY  FOLLOWING  A  MEAL   ONE-A-DAY MENS VITACRAVES PO Take 1 tablet by mouth daily.   sildenafil 20 MG tablet Commonly known as: Revatio Take 1 tablet (20 mg total) by mouth as needed. Take 1-5 tabs as needed prior to intercourse Started by: Hollice Espy, MD   triamcinolone cream 0.1 % Commonly known as: KENALOG Apply 1 application topically daily as needed (for skin irritation).   valsartan-hydrochlorothiazide 160-25 MG tablet Commonly known as: DIOVAN-HCT Take 1 tablet by mouth daily.       Allergies: No Known Allergies  Family History: Family History  Problem Relation Age of Onset  . Hypertension Mother   . Alzheimer's disease Mother   . Hypertension Father   . Cancer Father        lung  . Hypertension Sister   . Hypertension Brother   . Hypertension Sister   . Hypertension Sister   . Hypertension Brother   . Prostate cancer Neg Hx   . Kidney cancer Neg Hx     Social History:  reports that he quit smoking about 34 years ago. His smoking use included cigarettes. He has a 5.00 pack-year smoking history. He has never used smokeless tobacco. He reports that he does not drink alcohol or use drugs.   Physical Exam: BP (!) 170/81   Pulse (!) 53   Ht 5' 11.5" (1.816 m)   Wt 223 lb (101.2 kg)   BMI 30.67 kg/m   Constitutional:  Alert and oriented, No acute distress. HEENT: [place] AT, moist mucus membranes.  Trachea midline, no masses. Cardiovascular: No clubbing, cyanosis, or edema. Respiratory: Normal respiratory effort, no increased work of breathing. Skin: No rashes, bruises or suspicious lesions. Neurologic: Grossly intact, no focal deficits, moving all 4 extremities. Psychiatric: Normal mood and affect.  Pertinent Imaging: Results for orders placed or performed in visit on 01/18/20    BLADDER SCAN AMB NON-IMAGING  Result Value Ref Range   Scan Result 0    Assessment & Plan:    1. Stress incontinence in male Improved / resolved He should continue kegels  2. BPH with bladder obstruction Doing well s/p holium with symptom improvement Adequate emptying of bladder today  No longer on BPH medication Given good health will have PSA screened annually until 74 yo and defer screening afterwards F/u with PCP for annual screening   3. Erectile  dysfunction We discussed the pathophysiology of erectile dysfunction today along with possible congestive any factors. Discussed treatment option of Viagara along with the risks and benefits including vision and headaches.  We discussed contraindications for this medication as well as common side effects. Patient was counseled on optimal use. All of his questions were answered in detail. Rx of Viagara given to pt  F/u with PCP and discuss cardiac issues   4. Vaccine Counseling  Lengthy discussion regarding the risks and benefits regarding Covid-19 vaccines   Location and information regarding Covid-19 vaccination given   Return if symptoms worsen or fail to improve.  [place] [address] [place], [place] [address] [phone]  I, Lucas Mallow, am acting as a scribe for Dr. Hollice Espy,  I have reviewed the above documentation for accuracy and completeness, and I agree with the above.   Hollice Espy, MD

## 2020-01-18 ENCOUNTER — Ambulatory Visit: Payer: Medicare Other | Admitting: Urology

## 2020-01-18 ENCOUNTER — Encounter: Payer: Self-pay | Admitting: Urology

## 2020-01-18 ENCOUNTER — Other Ambulatory Visit: Payer: Self-pay

## 2020-01-18 VITALS — BP 170/81 | HR 53 | Ht 71.5 in | Wt 223.0 lb

## 2020-01-18 DIAGNOSIS — N138 Other obstructive and reflux uropathy: Secondary | ICD-10-CM

## 2020-01-18 DIAGNOSIS — N401 Enlarged prostate with lower urinary tract symptoms: Secondary | ICD-10-CM

## 2020-01-18 LAB — BLADDER SCAN AMB NON-IMAGING: Scan Result: 0

## 2020-01-18 MED ORDER — SILDENAFIL CITRATE 20 MG PO TABS: 20.0000 mg | ORAL_TABLET | ORAL | 11 refills | Status: DC | PRN

## 2020-01-18 NOTE — Patient Instructions (Signed)
COVID-19 Vaccine Information can be found at: [email or web address] For questions related to vaccine distribution or appointments, please email [email or web address] or call [phone].    

## 2020-02-16 ENCOUNTER — Encounter: Payer: Medicare Other | Admitting: Family Medicine

## 2020-04-23 ENCOUNTER — Telehealth: Payer: Self-pay | Admitting: Family Medicine

## 2020-04-23 NOTE — Telephone Encounter (Signed)
Copied from [place] [phone]. Topic: Medicare AWV >> Apr 23, 2020 11:54 AM Cher Nakai R wrote: Reason for CRM: Left message for patient to call back and schedule Medicare Annual Wellness Visit (AWV) to be done virtually.  No hx of AWV; please schedule at anytime with [facility] Health Advisor.      43 Minutes appointment

## 2020-05-16 ENCOUNTER — Telehealth: Payer: Self-pay | Admitting: Family Medicine

## 2020-05-16 NOTE — Telephone Encounter (Signed)
Copied from [place] [id number]. Topic: Medicare AWV >> May 16, 2020 12:17 PM Cher Nakai R wrote: Reason for CRM:  Left message for patient to call back and schedule the Medicare Annual Wellness Visit (AWV) virtually.  Last AWV 06/29/2018   Please schedule at anytime with [facility] Health Advisor.  45 minute appointment

## 2020-07-26 ENCOUNTER — Telehealth: Payer: Self-pay | Admitting: Family Medicine

## 2020-07-26 NOTE — Telephone Encounter (Signed)
Patient declined the Medicare Wellness Visit with [facility] at this time

## 2020-08-31 ENCOUNTER — Ambulatory Visit (INDEPENDENT_AMBULATORY_CARE_PROVIDER_SITE_OTHER): Payer: Medicare Other

## 2020-08-31 VITALS — BP 146/73 | Ht 73.0 in | Wt 222.0 lb

## 2020-08-31 DIAGNOSIS — Z0000 Encounter for general adult medical examination without abnormal findings: Secondary | ICD-10-CM | POA: Diagnosis not present

## 2020-08-31 NOTE — Progress Notes (Signed)
I connected with Drew Mcmahon today by telephone and verified that I am speaking with the correct person using two identifiers. Location patient: home Location provider: work Persons participating in the virtual visit: Drew Mcmahon, Drew Durand LPN.   I discussed the limitations, risks, security and privacy concerns of performing an evaluation and management service by telephone and the availability of in person appointments. I also discussed with the patient that there may be a patient responsible charge related to this service. The patient expressed understanding and verbally consented to this telephonic visit.    Interactive audio and video telecommunications were attempted between this provider and patient, however failed, due to patient having technical difficulties OR patient did not have access to video capability.  We continued and completed visit with audio only.     Vital signs may be patient reported or missing.  Subjective:   Drew Mcmahon is a 74 y.o. male who presents for Medicare Annual/Subsequent preventive examination.  Review of Systems     Cardiac Risk Factors include: advanced age (>82men, >61 women);dyslipidemia;hypertension;male gender;sedentary lifestyle     Objective:    Today's Vitals   08/31/20 0915  BP: (!) 146/73  Weight: 222 lb (100.7 kg)  Height: 6\' 1"  (1.854 m)   Body mass index is 29.29 kg/m.  Advanced Directives 08/31/2020 12/13/2018 12/13/2018 11/04/2018 06/29/2018 06/14/2018 06/04/2018  Does Patient Have a Medical Advance Directive? Yes No No No No No No  Type of Paramedic of [place];Living will - - - - - -  Copy of [place] in Chart? No - copy requested - - - - - -  Would patient like information on creating a medical advance directive? - No - Patient declined - - Yes (MAU/Ambulatory/Procedural Areas - Information given) No - Patient declined No - Patient declined    Current Medications  (verified) Outpatient Encounter Medications as of 08/31/2020  Medication Sig  . aspirin (ASPIRIN EC) 81 MG EC tablet Take 81 mg by mouth daily. Swallow whole.  Marland Kitchen atorvastatin (LIPITOR) 20 MG tablet Take 1 tablet (20 mg total) by mouth at bedtime.  . metoprolol succinate (TOPROL-XL) 50 MG 24 hr tablet TAKE 1 TABLET BY MOUTH ONCE DAILY TAKE  WITH  OR  IMMEDIATELY  FOLLOWING  A  MEAL  . Multiple Vitamins-Minerals (ONE-A-DAY MENS VITACRAVES PO) Take 1 tablet by mouth daily.   Marland Kitchen triamcinolone cream (KENALOG) 0.1 % Apply 1 application topically daily as needed (for skin irritation).   . valsartan-hydrochlorothiazide (DIOVAN-HCT) 160-25 MG tablet Take 1 tablet by mouth daily.  . sildenafil (REVATIO) 20 MG tablet Take 1 tablet (20 mg total) by mouth as needed. Take 1-5 tabs as needed prior to intercourse   No facility-administered encounter medications on file as of 08/31/2020.    Allergies (verified) Patient has no known allergies.   History: Past Medical History:  Diagnosis Date  . Elevated lipids   . Hip fracture ([place]) 2007  . History of hip fracture 07/04/2018  . Hyperlipidemia   . Hypertension   . Osteopenia 07/19/2018   Sept 2019; next scan Sept 2021  . Postoperative nausea and vomiting 12/24/2018   Past Surgical History:  Procedure Laterality Date  . APPENDECTOMY  04/03/2017   Incidental APPENDECTOMY;  Surgeon: [name], MD;  Location: [facility] ORS;  Service: General;;  . BOWEL RESECTION  04/03/2017   Procedure: FOCAL SMALL BOWEL RESECTION;  Surgeon: [name], MD;  Location: [facility] ORS;  Service: General;;  .  COLONOSCOPY WITH PROPOFOL N/A 06/12/2016   Procedure: COLONOSCOPY WITH PROPOFOL;  Surgeon: Manya Silvas, MD;  Location: [facility] ENDOSCOPY;  Service: Endoscopy;  Laterality: N/A;  . FRACTURE SURGERY Left 2007   hip  . HOLEP-LASER ENUCLEATION OF THE PROSTATE WITH MORCELLATION N/A 06/14/2018   Procedure: HOLEP-LASER ENUCLEATION OF THE PROSTATE WITH MORCELLATION;  Surgeon:  Hollice Espy, MD;  Location: [facility] ORS;  Service: Urology;  Laterality: N/A;  . LAPAROSCOPY N/A 04/01/2017   Procedure: LAPAROSCOPY DIAGNOSTIC;  Surgeon: [name], MD;  Location: [facility];  Service: General;  Laterality: N/A;  . LAPAROTOMY N/A 04/03/2017   Procedure: EXPLORATORY LAPAROTOMY;  Surgeon: [name], MD;  Location: [facility];  Service: General;  Laterality: N/A;  . LYSIS OF ADHESION  04/03/2017   Procedure: LYSIS OF ADHESION;  Surgeon: [name], MD;  Location: [facility] ORS;  Service: General;;  . VENTRAL HERNIA REPAIR N/A 12/13/2018   Procedure: HERNIA REPAIR VENTRAL ADULT WITH COMPONENT SEPARATOR MESH;  Surgeon: [name], MD;  Location: [facility] ORS;  Service: General;  Laterality: N/A;   Family History  Problem Relation Age of Onset  . Hypertension Mother   . Alzheimer's disease Mother   . Hypertension Father   . Cancer Father        lung  . Hypertension Sister   . Hypertension Brother   . Hypertension Sister   . Hypertension Sister   . Hypertension Brother   . Prostate cancer Neg Hx   . Kidney cancer Neg Hx    Social History   Socioeconomic History  . Marital status: Married    Spouse name: Edd Fabian  . Number of children: 3  . Years of education: Not on file  . Highest education level: 12th grade  Occupational History  . Occupation: Retired  Tobacco Use  . Smoking status: Former Smoker    Packs/day: 0.50    Years: 10.00    Pack years: 5.00    Types: Cigarettes    Quit date: 08/26/1985    Years since quitting: 35.0  . Smokeless tobacco: Never Used  . Tobacco comment: smoking cessation materials not required  Vaping Use  . Vaping Use: Never used  Substance and Sexual Activity  . Alcohol use: No  . Drug use: No  . Sexual activity: Not Currently  Other Topics Concern  . Not on file  Social History Narrative  . Not on file   Social Determinants of Health   Financial Resource Strain: Low Risk   . Difficulty of Paying Living  Expenses: Not hard at all  Food Insecurity: No Food Insecurity  . Worried About Charity fundraiser in the Last Year: Never true  . Ran Out of Food in the Last Year: Never true  Transportation Needs: No Transportation Needs  . Lack of Transportation (Medical): No  . Lack of Transportation (Non-Medical): No  Physical Activity: Inactive  . Days of Exercise per Week: 0 days  . Minutes of Exercise per Session: 0 min  Stress: No Stress Concern Present  . Feeling of Stress : Not at all  Social Connections:   . Frequency of Communication with Friends and Family: Not on file  . Frequency of Social Gatherings with Friends and Family: Not on file  . Attends Religious Services: Not on file  . Active Member of Clubs or Organizations: Not on file  . Attends Archivist Meetings: Not on file  . Marital Status: Not on file    Tobacco Counseling  Counseling given: Not Answered Comment: smoking cessation materials not required   Clinical Intake:  Pre-visit preparation completed: Yes  Pain : No/denies pain     Nutritional Status: BMI 25 -29 Overweight Nutritional Risks: None Diabetes: No  How often do you need to have someone help you when you read instructions, pamphlets, or other written materials from your doctor or pharmacy?: 1 - Never What is the last grade level you completed in school?: 12th grade  Diabetic?no  Interpreter Needed?: No  Information entered by :: NAllen LPN   Activities of Daily Living In your present state of health, do you have any difficulty performing the following activities: 08/31/2020 12/19/2019  Hearing? Tempie Donning  Comment wears hearing aides -  Vision? N N  Difficulty concentrating or making decisions? N N  Walking or climbing stairs? N N  Dressing or bathing? N N  Doing errands, shopping? N N  Preparing Food and eating ? N -  Using the Toilet? N -  In the past six months, have you accidently leaked urine? Y -  Comment had prostate procedure -   Do you have problems with loss of bowel control? N -  Managing your Medications? N -  Managing your Finances? N -  Housekeeping or managing your Housekeeping? N -  Some recent data might be hidden    Patient Care Team: Volney American, PA-C as PCP - General (Family Medicine) Hollice Espy, MD as Consulting Physician (Urology) Bary Castilla Forest Gleason, MD as Consulting Physician (General Surgery)  Indicate any recent Medical Services you may have received from other than [facility] providers in the past year (date may be approximate).     Assessment:   This is a routine wellness examination for Drew Mcmahon.  Hearing/Vision screen  Hearing Screening   125Hz  250Hz  500Hz  1000Hz  2000Hz  3000Hz  4000Hz  6000Hz  8000Hz   Right ear:           Left ear:           Vision Screening Comments: Regular Eye Exams, no set practice  Dietary issues and exercise activities discussed: Current Exercise Habits: The patient does not participate in regular exercise at present  Goals    . DIET - INCREASE WATER INTAKE     Recommend to drink at least 6-8 8oz glasses of water per day.    . Patient Stated     08/31/2020, get down to 200 pounds      Depression Screen PHQ 2/9 Scores 08/31/2020 12/19/2019 07/29/2019 04/27/2019 12/01/2018 06/29/2018 05/31/2018  PHQ - 2 Score 0 0 3 0 0 0 0  PHQ- 9 Score - 0 3 0 0 0 -    Fall Risk Fall Risk  08/31/2020 07/29/2019 04/27/2019 04/14/2019 03/08/2019  Falls in the past year? 0 0 0 0 0  Number falls in past yr: - 0 0 - -  Injury with Fall? - 0 0 - -  Risk for fall due to : Medication side effect - - - -  Risk for fall due to: Comment - - - - -  Follow up Falls evaluation completed;Education provided;Falls prevention discussed - - Falls evaluation completed -    Any stairs in or around the home? Yes  If so, are there any without handrails? No  Home free of loose throw rugs in walkways, pet beds, electrical cords, etc? Yes  Adequate lighting in your home to reduce risk of falls? Yes    ASSISTIVE DEVICES UTILIZED TO PREVENT FALLS:  Life alert? No  Use  of a cane, walker or w/c? No  Grab bars in the bathroom? Yes  Shower chair or bench in shower? Yes  Elevated toilet seat or a handicapped toilet? Yes   TIMED UP AND GO:  Was the test performed? No .     Cognitive Function:     6CIT Screen 08/31/2020 06/29/2018  What Year? 0 points 0 points  What month? 0 points 0 points  What time? 0 points 0 points  Count back from 20 0 points 0 points  Months in reverse 0 points 0 points  Repeat phrase 0 points 0 points  Total Score 0 0    Immunizations Immunization History  Administered Date(s) Administered  . Fluad Quad(high Dose 65+) 07/29/2019  . Influenza, High Dose Seasonal PF 09/02/2017, 06/29/2018  . Influenza,inj,Quad PF,6+ Mos 08/27/2015  . Influenza-Unspecified 09/07/2014, 09/10/2016  . Pneumococcal Conjugate-13 04/10/2014  . Pneumococcal Polysaccharide-23 04/07/2013  . Td 05/18/2008    TDAP status: Up to date Flu Vaccine status: will get at a later date Pneumococcal vaccine status: Up to date Covid-19 vaccine status: Declined, Education has been provided regarding the importance of this vaccine but patient still declined. Advised may receive this vaccine at local pharmacy or Health Dept.or vaccine clinic. Aware to provide a copy of the vaccination record if obtained from local pharmacy or Health Dept. Verbalized acceptance and understanding.  Qualifies for Shingles Vaccine? Yes   Zostavax completed No   Shingrix Completed?: No.    Education has been provided regarding the importance of this vaccine. Patient has been advised to call insurance company to determine out of pocket expense if they have not yet received this vaccine. Advised may also receive vaccine at local pharmacy or Health Dept. Verbalized acceptance and understanding.  Screening Tests Health Maintenance  Topic Date Due  . TETANUS/TDAP  05/18/2018  . INFLUENZA VACCINE  05/27/2020  .  COVID-19 Vaccine (1) 09/16/2020 (Originally 11/03/1957)  . COLONOSCOPY  06/12/2026  . Hepatitis C Screening  Completed  . PNA vac Low Risk Adult  Completed    Health Maintenance  Health Maintenance Due  Topic Date Due  . TETANUS/TDAP  05/18/2018  . INFLUENZA VACCINE  05/27/2020    Colorectal cancer screening: Completed 06/12/2016. Repeat every 5 years  Lung Cancer Screening: (Low Dose CT Chest recommended if Age 73-80 years, 30 pack-year currently smoking OR have quit w/in 15years.) does not qualify.   Lung Cancer Screening Referral: no  Additional Screening:  Hepatitis C Screening: does qualify; Completed 03/04/2016  Vision Screening: Recommended annual ophthalmology exams for early detection of glaucoma and other disorders of the eye. Is the patient up to date with their annual eye exam?  Yes  Who is the provider or what is the name of the office in which the patient attends annual eye exams? No set practice If pt is not established with a provider, would they like to be referred to a provider to establish care? No .   Dental Screening: Recommended annual dental exams for proper oral hygiene  Community Resource Referral / Chronic Care Management: CRR required this visit?  No   CCM required this visit?  No      Plan:     I have personally reviewed and noted the following in the patient's chart:   . Medical and social history . Use of alcohol, tobacco or illicit drugs  . Current medications and supplements . Functional ability and status . Nutritional status . Physical activity . Advanced directives . List of  other physicians . Hospitalizations, surgeries, and ER visits in previous 12 months . Vitals . Screenings to include cognitive, depression, and falls . Referrals and appointments  In addition, I have reviewed and discussed with patient certain preventive protocols, quality metrics, and best practice recommendations. A written personalized care plan for  preventive services as well as general preventive health recommendations were provided to patient.     Kellie Simmering, LPN   [id number]   Nurse Notes:

## 2020-08-31 NOTE — Patient Instructions (Signed)
Drew Mcmahon , Thank you for taking time to come for your Medicare Wellness Visit. I appreciate your ongoing commitment to your health goals. Please review the following plan we discussed and let me know if I can assist you in the future.   Screening recommendations/referrals: Colonoscopy: completed 06/12/2016, due 06/12/2021 Recommended yearly ophthalmology/optometry visit for glaucoma screening and checkup Recommended yearly dental visit for hygiene and checkup  Vaccinations: Influenza vaccine: will get later Pneumococcal vaccine: completed 04/10/2014 Tdap vaccine: due Shingles vaccine: discussed   Covid-19:  decline  Advanced directives:  Please bring a copy of your POA (Power of Attorney) and/or Living Will to your next appointment.   Conditions/risks identified: none  Next appointment: Follow up in one year for your annual wellness visit.   Preventive Care 73 Years and Older, Male Preventive care refers to lifestyle choices and visits with your health care provider that can promote health and wellness. What does preventive care include?  A yearly physical exam. This is also called an annual well check.  Dental exams once or twice a year.  Routine eye exams. Ask your health care provider how often you should have your eyes checked.  Personal lifestyle choices, including:  Daily care of your teeth and gums.  Regular physical activity.  Eating a healthy diet.  Avoiding tobacco and drug use.  Limiting alcohol use.  Practicing safe sex.  Taking low doses of aspirin every day.  Taking vitamin and mineral supplements as recommended by your health care provider. What happens during an annual well check? The services and screenings done by your health care provider during your annual well check will depend on your age, overall health, lifestyle risk factors, and family history of disease. Counseling  Your health care provider may ask you questions about your:  Alcohol  use.  Tobacco use.  Drug use.  Emotional well-being.  Home and relationship well-being.  Sexual activity.  Eating habits.  History of falls.  Memory and ability to understand (cognition).  Work and work Statistician. Screening  You may have the following tests or measurements:  Height, weight, and BMI.  Blood pressure.  Lipid and cholesterol levels. These may be checked every 5 years, or more frequently if you are over 73 years old.  Skin check.  Lung cancer screening. You may have this screening every year starting at age 73 if you have a 30-pack-year history of smoking and currently smoke or have quit within the past 15 years.  Fecal occult blood test (FOBT) of the stool. You may have this test every year starting at age 40.  Flexible sigmoidoscopy or colonoscopy. You may have a sigmoidoscopy every 5 years or a colonoscopy every 10 years starting at age 73.  Prostate cancer screening. Recommendations will vary depending on your family history and other risks.  Hepatitis C blood test.  Hepatitis B blood test.  Sexually transmitted disease (STD) testing.  Diabetes screening. This is done by checking your blood sugar (glucose) after you have not eaten for a while (fasting). You may have this done every 1-3 years.  Abdominal aortic aneurysm (AAA) screening. You may need this if you are a current or former smoker.  Osteoporosis. You may be screened starting at age 73 if you are at high risk. Talk with your health care provider about your test results, treatment options, and if necessary, the need for more tests. Vaccines  Your health care provider may recommend certain vaccines, such as:  Influenza vaccine. This is recommended every  year.  Tetanus, diphtheria, and acellular pertussis (Tdap, Td) vaccine. You may need a Td booster every 10 years.  Zoster vaccine. You may need this after age 73.  Pneumococcal 13-valent conjugate (PCV13) vaccine. One dose is  recommended after age 73.  Pneumococcal polysaccharide (PPSV23) vaccine. One dose is recommended after age 73. Talk to your health care provider about which screenings and vaccines you need and how often you need them. This information is not intended to replace advice given to you by your health care provider. Make sure you discuss any questions you have with your health care provider. Document Released: 11/09/2015 Document Revised: 07/02/2016 Document Reviewed: 08/14/2015 Elsevier Interactive Patient Education  2017 [place] Prevention in the Home Falls can cause injuries. They can happen to people of all ages. There are many things you can do to make your home safe and to help prevent falls. What can I do on the outside of my home?  Regularly fix the edges of walkways and driveways and fix any cracks.  Remove anything that might make you trip as you walk through a door, such as a raised step or threshold.  Trim any bushes or trees on the path to your home.  Use bright outdoor lighting.  Clear any walking paths of anything that might make someone trip, such as rocks or tools.  Regularly check to see if handrails are loose or broken. Make sure that both sides of any steps have handrails.  Any raised decks and porches should have guardrails on the edges.  Have any leaves, snow, or ice cleared regularly.  Use sand or salt on walking paths during winter.  Clean up any spills in your garage right away. This includes oil or grease spills. What can I do in the bathroom?  Use night lights.  Install grab bars by the toilet and in the tub and shower. Do not use towel bars as grab bars.  Use non-skid mats or decals in the tub or shower.  If you need to sit down in the shower, use a plastic, non-slip stool.  Keep the floor dry. Clean up any water that spills on the floor as soon as it happens.  Remove soap buildup in the tub or shower regularly.  Attach bath mats  securely with double-sided non-slip rug tape.  Do not have throw rugs and other things on the floor that can make you trip. What can I do in the bedroom?  Use night lights.  Make sure that you have a light by your bed that is easy to reach.  Do not use any sheets or blankets that are too big for your bed. They should not hang down onto the floor.  Have a firm chair that has side arms. You can use this for support while you get dressed.  Do not have throw rugs and other things on the floor that can make you trip. What can I do in the kitchen?  Clean up any spills right away.  Avoid walking on wet floors.  Keep items that you use a lot in easy-to-reach places.  If you need to reach something above you, use a strong step stool that has a grab bar.  Keep electrical cords out of the way.  Do not use floor polish or wax that makes floors slippery. If you must use wax, use non-skid floor wax.  Do not have throw rugs and other things on the floor that can make you trip. What can  I do with my stairs?  Do not leave any items on the stairs.  Make sure that there are handrails on both sides of the stairs and use them. Fix handrails that are broken or loose. Make sure that handrails are as long as the stairways.  Check any carpeting to make sure that it is firmly attached to the stairs. Fix any carpet that is loose or worn.  Avoid having throw rugs at the top or bottom of the stairs. If you do have throw rugs, attach them to the floor with carpet tape.  Make sure that you have a light switch at the top of the stairs and the bottom of the stairs. If you do not have them, ask someone to add them for you. What else can I do to help prevent falls?  Wear shoes that:  Do not have high heels.  Have rubber bottoms.  Are comfortable and fit you well.  Are closed at the toe. Do not wear sandals.  If you use a stepladder:  Make sure that it is fully opened. Do not climb a closed  stepladder.  Make sure that both sides of the stepladder are locked into place.  Ask someone to hold it for you, if possible.  Clearly mark and make sure that you can see:  Any grab bars or handrails.  First and last steps.  Where the edge of each step is.  Use tools that help you move around (mobility aids) if they are needed. These include:  Canes.  Walkers.  Scooters.  Crutches.  Turn on the lights when you go into a dark area. Replace any light bulbs as soon as they burn out.  Set up your furniture so you have a clear path. Avoid moving your furniture around.  If any of your floors are uneven, fix them.  If there are any pets around you, be aware of where they are.  Review your medicines with your doctor. Some medicines can make you feel dizzy. This can increase your chance of falling. Ask your doctor what other things that you can do to help prevent falls. This information is not intended to replace advice given to you by your health care provider. Make sure you discuss any questions you have with your health care provider. Document Released: 08/09/2009 Document Revised: 03/20/2016 Document Reviewed: 11/17/2014 Elsevier Interactive Patient Education  2017 [employer].

## 2020-09-25 ENCOUNTER — Encounter: Payer: Self-pay | Admitting: Family Medicine

## 2020-09-25 ENCOUNTER — Other Ambulatory Visit: Payer: Self-pay

## 2020-09-25 ENCOUNTER — Ambulatory Visit (INDEPENDENT_AMBULATORY_CARE_PROVIDER_SITE_OTHER): Payer: Medicare Other | Admitting: Family Medicine

## 2020-09-25 VITALS — BP 142/72 | HR 55 | Temp 97.6°F | Ht 72.2 in | Wt 220.4 lb

## 2020-09-25 DIAGNOSIS — I10 Essential (primary) hypertension: Secondary | ICD-10-CM | POA: Diagnosis not present

## 2020-09-25 DIAGNOSIS — Z23 Encounter for immunization: Secondary | ICD-10-CM

## 2020-09-25 DIAGNOSIS — I700 Atherosclerosis of aorta: Secondary | ICD-10-CM | POA: Diagnosis not present

## 2020-09-25 DIAGNOSIS — E669 Obesity, unspecified: Secondary | ICD-10-CM

## 2020-09-25 DIAGNOSIS — N400 Enlarged prostate without lower urinary tract symptoms: Secondary | ICD-10-CM

## 2020-09-25 DIAGNOSIS — E785 Hyperlipidemia, unspecified: Secondary | ICD-10-CM

## 2020-09-25 DIAGNOSIS — R7303 Prediabetes: Secondary | ICD-10-CM | POA: Diagnosis not present

## 2020-09-25 DIAGNOSIS — G603 Idiopathic progressive neuropathy: Secondary | ICD-10-CM

## 2020-09-25 LAB — BAYER DCA HB A1C WAIVED: HB A1C (BAYER DCA - WAIVED): 5.6 % (ref <7.0)

## 2020-09-25 MED ORDER — ATORVASTATIN CALCIUM 20 MG PO TABS: 20.0000 mg | ORAL_TABLET | Freq: Every day | ORAL | 3 refills | Status: DC

## 2020-09-25 MED ORDER — VALSARTAN-HYDROCHLOROTHIAZIDE 160-25 MG PO TABS: 1.0000 | ORAL_TABLET | Freq: Every day | ORAL | 3 refills | Status: DC

## 2020-09-25 MED ORDER — METOPROLOL SUCCINATE ER 50 MG PO TB24: ORAL_TABLET | ORAL | 3 refills | Status: DC

## 2020-09-25 NOTE — Assessment & Plan Note (Signed)
Continue statin. Recheck lipid panel today.

## 2020-09-25 NOTE — Assessment & Plan Note (Signed)
Recheck A1c today. Counseling done regarding diet and exercise.

## 2020-09-25 NOTE — Assessment & Plan Note (Signed)
Not bothersome to pt. Continue to monitor.

## 2020-09-25 NOTE — Progress Notes (Signed)
BP (!) 142/72   Pulse (!) 55   Temp 97.6 F (36.4 C) (Oral)   Ht 6' 0.2" (1.834 m)   Wt 220 lb 6.4 oz (100 kg)   SpO2 98%   BMI 29.73 kg/m    Subjective:    Patient ID: Drew Mcmahon, male    DOB: 1946/06/20, 74 y.o.   MRN: [id number]  HPI: Drew Mcmahon is a 74 y.o. male presenting on 09/25/2020 for comprehensive medical examination. Current medical complaints include:none  He currently lives with: wife Interim Problems from his last visit: no   Hypertension: - Medications: metoprolol 50mg  daily, valsartan-HCTZ 160-25mg  daily - Compliance: good - Checking BP at home: yes, SBP normally in 130s. Has h/o white coat HTN. - Denies any SOB, CP, vision changes, LE edema, medication SEs, or symptoms of hypotension - Diet: see below - Exercise: see below  HLD - medications: atorvastatin 20mg  daily - compliance: good - medication SEs: has occasional leg cramps, able to stretch out, doesn't bother.  Benign Prostatic Hypertrophy - Medications: none - Symptoms: nocturia few times per week, occasional incontinence. Not doing kegels.  - Denies: frequency, incomplete emptying, straining and weak stream, gross hematuria, kidney stones and recurrent UTI.  - He has no personal or FH of prostate cancer.   - follows with Urology, last appt 01/18/20. H/o homium laser enucleation 2019. Recent PSA 0.6 12/2019.  Prediabetes - Last A1c 5.7 07/2019 - Medications: none - Compliance: n/a - Checking BG at home: no - Diet: eats 3 meals per day. Slim fast for breakfast, sandwich for lunch, meat/potatoes/beans for dinner. Popcorn for snack.  - Exercise: walk dog every once in a while.  - Eye exam: 1 year ago - Statin: yes - Denies symptoms of hypoglycemia, polyuria, polydipsia, foot ulcers/trauma  Depression Screen done today and results listed below:  Depression screen [facility] 2/9 09/25/2020 08/31/2020 12/19/2019 07/29/2019 04/27/2019  Decreased Interest 0 0 0 3 0  Down, Depressed, Hopeless 0 0 0 0 0   PHQ - 2 Score 0 0 0 3 0  Altered sleeping - - 0 0 0  Tired, decreased energy - - 0 0 0  Change in appetite - - 0 0 0  Feeling bad or failure about yourself  - - 0 0 0  Trouble concentrating - - 0 0 0  Moving slowly or fidgety/restless - - 0 0 0  Suicidal thoughts - - 0 0 0  PHQ-9 Score - - 0 3 0  Difficult doing work/chores - - - Not difficult at all -    The patient does not have a history of falls. I did complete a risk assessment for falls. A plan of care for falls was not documented.   Past Medical History:  Past Medical History:  Diagnosis Date  . Elevated lipids   . Hip fracture ([place]) 2007  . History of hip fracture 07/04/2018  . Hyperlipidemia   . Hypertension   . Osteopenia 07/19/2018   Sept 2019; next scan Sept 2021  . Postoperative nausea and vomiting 12/24/2018    Surgical History:  Past Surgical History:  Procedure Laterality Date  . APPENDECTOMY  04/03/2017   Incidental APPENDECTOMY;  Surgeon: [name], MD;  Location: [facility] ORS;  Service: General;;  . BOWEL RESECTION  04/03/2017   Procedure: FOCAL SMALL BOWEL RESECTION;  Surgeon: [name], MD;  Location: [facility] ORS;  Service: General;;  . COLONOSCOPY WITH PROPOFOL N/A 06/12/2016   Procedure: COLONOSCOPY WITH PROPOFOL;  Surgeon: Manya Silvas, MD;  Location: [facility] ENDOSCOPY;  Service: Endoscopy;  Laterality: N/A;  . FRACTURE SURGERY Left 2007   hip  . HOLEP-LASER ENUCLEATION OF THE PROSTATE WITH MORCELLATION N/A 06/14/2018   Procedure: HOLEP-LASER ENUCLEATION OF THE PROSTATE WITH MORCELLATION;  Surgeon: Hollice Espy, MD;  Location: [facility] ORS;  Service: Urology;  Laterality: N/A;  . LAPAROSCOPY N/A 04/01/2017   Procedure: LAPAROSCOPY DIAGNOSTIC;  Surgeon: [name], MD;  Location: [place] [facility];  Service: General;  Laterality: N/A;  . LAPAROTOMY N/A 04/03/2017   Procedure: EXPLORATORY LAPAROTOMY;  Surgeon: [name], MD;  Location: [facility];  Service: General;  Laterality: N/A;  . LYSIS OF  ADHESION  04/03/2017   Procedure: LYSIS OF ADHESION;  Surgeon: [name], MD;  Location: [facility] ORS;  Service: General;;  . VENTRAL HERNIA REPAIR N/A 12/13/2018   Procedure: HERNIA REPAIR VENTRAL ADULT WITH COMPONENT SEPARATOR MESH;  Surgeon: [name], MD;  Location: [facility] ORS;  Service: General;  Laterality: N/A;    Medications:  Current Outpatient Medications on File Prior to Visit  Medication Sig  . aspirin (ASPIRIN EC) 81 MG EC tablet Take 81 mg by mouth daily. Swallow whole.  . Multiple Vitamins-Minerals (ONE-A-DAY MENS VITACRAVES PO) Take 1 tablet by mouth daily.    No current facility-administered medications on file prior to visit.    Allergies:  No Known Allergies  Social History:  Social History   Socioeconomic History  . Marital status: Married    Spouse name: Edd Fabian  . Number of children: 3  . Years of education: Not on file  . Highest education level: 12th grade  Occupational History  . Occupation: Retired  Tobacco Use  . Smoking status: Former Smoker    Packs/day: 0.50    Years: 10.00    Pack years: 5.00    Types: Cigarettes    Quit date: 08/26/1985    Years since quitting: 35.1  . Smokeless tobacco: Never Used  . Tobacco comment: smoking cessation materials not required  Vaping Use  . Vaping Use: Never used  Substance and Sexual Activity  . Alcohol use: No  . Drug use: No  . Sexual activity: Not Currently  Other Topics Concern  . Not on file  Social History Narrative  . Not on file   Social Determinants of Health   Financial Resource Strain: Low Risk   . Difficulty of Paying Living Expenses: Not hard at all  Food Insecurity: No Food Insecurity  . Worried About Charity fundraiser in the Last Year: Never true  . Ran Out of Food in the Last Year: Never true  Transportation Needs: No Transportation Needs  . Lack of Transportation (Medical): No  . Lack of Transportation (Non-Medical): No  Physical Activity: Inactive  . Days of  Exercise per Week: 0 days  . Minutes of Exercise per Session: 0 min  Stress: No Stress Concern Present  . Feeling of Stress : Not at all  Social Connections:   . Frequency of Communication with Friends and Family: Not on file  . Frequency of Social Gatherings with Friends and Family: Not on file  . Attends Religious Services: Not on file  . Active Member of Clubs or Organizations: Not on file  . Attends Archivist Meetings: Not on file  . Marital Status: Not on file  Intimate Partner Violence:   . Fear of Current or Ex-Partner: Not on file  . Emotionally Abused: Not on file  . Physically  Abused: Not on file  . Sexually Abused: Not on file   Social History   Tobacco Use  Smoking Status Former Smoker  . Packs/day: 0.50  . Years: 10.00  . Pack years: 5.00  . Types: Cigarettes  . Quit date: 08/26/1985  . Years since quitting: 35.1  Smokeless Tobacco Never Used  Tobacco Comment   smoking cessation materials not required   Social History   Substance and Sexual Activity  Alcohol Use No    Family History:  Family History  Problem Relation Age of Onset  . Hypertension Mother   . Alzheimer's disease Mother   . Hypertension Father   . Cancer Father        lung  . Hypertension Sister   . Hypertension Brother   . Hypertension Sister   . Hypertension Sister   . Hypertension Brother   . Prostate cancer Neg Hx   . Kidney cancer Neg Hx     Past medical history, surgical history, medications, allergies, family history and social history reviewed with patient today and changes made to appropriate areas of the chart.   Review of Systems - Denies chest pain, shortness of breath, difficulties eating/drinking/stooling. All other ROS negative except what is listed above and in the HPI.      Objective:    BP (!) 142/72   Pulse (!) 55   Temp 97.6 F (36.4 C) (Oral)   Ht 6' 0.2" (1.834 m)   Wt 220 lb 6.4 oz (100 kg)   SpO2 98%   BMI 29.73 kg/m   Wt Readings from  Last 3 Encounters:  09/25/20 220 lb 6.4 oz (100 kg)  08/31/20 222 lb (100.7 kg)  01/18/20 223 lb (101.2 kg)    Physical Exam Constitutional:      General: He is not in acute distress.    Appearance: Normal appearance. He is not ill-appearing.  HENT:     Head: Normocephalic.     Right Ear: External ear normal.     Left Ear: External ear normal.     Nose: Nose normal.     Mouth/Throat:     Mouth: Mucous membranes are moist.     Pharynx: Oropharynx is clear. No oropharyngeal exudate or posterior oropharyngeal erythema.  Eyes:     Extraocular Movements: Extraocular movements intact.  Cardiovascular:     Rate and Rhythm: Normal rate and regular rhythm.     Heart sounds: Normal heart sounds. No murmur heard.   Pulmonary:     Effort: Pulmonary effort is normal.     Breath sounds: Normal breath sounds. No wheezing or rales.  Abdominal:     General: Bowel sounds are normal. There is no distension.     Palpations: Abdomen is soft.     Tenderness: There is no abdominal tenderness. There is no guarding.  Musculoskeletal:        General: Normal range of motion.     Cervical back: Normal range of motion.     Right lower leg: No edema.     Left lower leg: No edema.  Lymphadenopathy:     Cervical: No cervical adenopathy.  Skin:    General: Skin is warm and dry.  Neurological:     Mental Status: He is alert and oriented to person, place, and time. Mental status is at baseline.  Psychiatric:        Mood and Affect: Mood normal.        Behavior: Behavior normal.     Results  for orders placed or performed in visit on 01/18/20  BLADDER SCAN AMB NON-IMAGING  Result Value Ref Range   Scan Result 0       Assessment & Plan:   Problem List Items Addressed This Visit      Cardiovascular and Mediastinum   Hypertension (Chronic)    Slightly above goal in office but known white coat HTN. At home readings at goal and without hypotensive symptoms. Refills sent. Labs ordered today. F/u in 6  months.      Relevant Medications   atorvastatin (LIPITOR) 20 MG tablet   metoprolol succinate (TOPROL-XL) 50 MG 24 hr tablet   valsartan-hydrochlorothiazide (DIOVAN-HCT) 160-25 MG tablet   Aortic calcification (HCC)    Continue statin. Recheck lipid panel today.      Relevant Medications   atorvastatin (LIPITOR) 20 MG tablet   metoprolol succinate (TOPROL-XL) 50 MG 24 hr tablet   valsartan-hydrochlorothiazide (DIOVAN-HCT) 160-25 MG tablet     Nervous and Auditory   Peripheral neuropathy    Not bothersome to pt. Continue to monitor.        Genitourinary   BPH (benign prostatic hyperplasia) (Chronic)    Doing well s/p TURP. Continue to monitor. Plan to recheck PSA in 12/2020 per urology recs.         Other   Hyperlipidemia (Chronic)    Stable. Continue statin. Recheck lipid panel today.      Relevant Medications   atorvastatin (LIPITOR) 20 MG tablet   metoprolol succinate (TOPROL-XL) 50 MG 24 hr tablet   valsartan-hydrochlorothiazide (DIOVAN-HCT) 160-25 MG tablet   Other Relevant Orders   Bayer DCA Hb A1c Waived (STAT)   Lipid panel   Basic Metabolic Panel (BMET)   Obesity (BMI 30.0-34.9)    Counseling done regarding lifestyle changes such as diet and exercise. See AVS for details.      Prediabetes    Recheck A1c today. Counseling done regarding diet and exercise.      Relevant Orders   Bayer DCA Hb A1c Waived (STAT)   Lipid panel   Basic Metabolic Panel (BMET)    Other Visit Diagnoses    Need for influenza vaccination    -  Primary   Relevant Orders   Flu Vaccine QUAD High Dose(Fluad) (Completed)   Essential hypertension       Relevant Medications   atorvastatin (LIPITOR) 20 MG tablet   metoprolol succinate (TOPROL-XL) 50 MG 24 hr tablet   valsartan-hydrochlorothiazide (DIOVAN-HCT) 160-25 MG tablet   Other Relevant Orders   Bayer DCA Hb A1c Waived (STAT)   Lipid panel   Basic Metabolic Panel (BMET)       Discussed aspirin prophylaxis for  myocardial infarction prevention and decision was made to continue ASA  LABORATORY TESTING:  Health maintenance labs ordered today as discussed above.   IMMUNIZATIONS:   - Tdap: Tetanus vaccination status reviewed: last tetanus booster 12 years ago. Does not desire today. - Influenza: Administered today - Pneumovax: Up to date - Prevnar: Up to date - HPV: Not applicable - Shingrix vaccine: due, pt plans to receive at pharmacy  - COVID: Refused  SCREENING: - Colonoscopy: Up to date  Discussed with patient purpose of the colonoscopy is to detect colon cancer at curable precancerous or early stages  - AAA Screening: Up to date 2013 - Lung Cancer Screening: Not applicable   PATIENT COUNSELING:    Sexuality: Discussed sexually transmitted diseases, partner selection, use of condoms, avoidance of unintended pregnancy  and contraceptive alternatives.  Advised to avoid cigarette smoking.  I discussed with the patient that most people either abstain from alcohol or drink within safe limits (<=14/week and <=4 drinks/occasion for males, <=7/weeks and <= 3 drinks/occasion for females) and that the risk for alcohol disorders and other health effects rises proportionally with the number of drinks per week and how often a drinker exceeds daily limits.  Discussed cessation/primary prevention of drug use and availability of treatment for abuse.   Diet: Encouraged to adjust caloric intake to maintain  or achieve ideal body weight, to reduce intake of dietary saturated fat and total fat, to limit sodium intake by avoiding high sodium foods and not adding table salt, and to maintain adequate dietary potassium and calcium preferably from fresh fruits, vegetables, and low-fat dairy products.    Stressed the importance of regular exercise  Injury prevention: Discussed safety belts, safety helmets, smoke detector, smoking near bedding or upholstery.   Dental health: Discussed importance of regular tooth  brushing, flossing, and dental visits.   Follow up plan: NEXT PREVENTATIVE PHYSICAL DUE IN 1 YEAR. Return in about 6 months (around 03/25/2021) for HTN.

## 2020-09-25 NOTE — Assessment & Plan Note (Signed)
Counseling done regarding lifestyle changes such as diet and exercise. See AVS for details.

## 2020-09-25 NOTE — Assessment & Plan Note (Signed)
Slightly above goal in office but known white coat HTN. At home readings at goal and without hypotensive symptoms. Refills sent. Labs ordered today. F/u in 6 months.

## 2020-09-25 NOTE — Assessment & Plan Note (Signed)
Doing well s/p TURP. Continue to monitor. Plan to recheck PSA in 12/2020 per urology recs.

## 2020-09-25 NOTE — Patient Instructions (Addendum)
It was great to see you!  Our plans for today:  - We sent refills to the pharmacy. - Check with your pharmacy about getting the Shingrix vaccine. - I recommend getting the COVID vaccine to prevent severe complications should you contract the virus. Let us know if you have questions. - See below for ways to increase your activity level. Try taking a walk after your biggest meal of the day. - See below for an example of a healthy plate.  We are checking some labs today, we will release these results to your MyChart.  Take care and seek immediate care sooner if you develop any concerns.  Dr. Ky Barban  Here is an example of what a healthy plate looks like:    ? Make half your plate fruits and vegetables.     ? Focus on whole fruits.     ? Vary your veggies.  ? Make half your grains whole grains. -     ? Look for the word "whole" at the beginning of the ingredients list    ? Some whole-grain ingredients include whole oats, whole-wheat flour,        whole-grain corn, whole-grain brown rice, and whole rye.  ? Move to low-fat and fat-free milk or yogurt.  ? Vary your protein routine. - Meat, fish, poultry (chicken, [place]), eggs, beans (kidney, pinto), dairy.  ? Drink and eat less sodium, saturated fat, and added sugars.  Look for opportunities to move your body throughout your day:  Never lie down when you can sit; never sit when you can stand; never stand when you can pace.  Moving your body throughout the day is just as important as the 30 or 60 minutes of exercise at the gym!  Get social Get active with your friends instead of going out to eat. Go for a hike, walk around the mall, or play an exercise-themed video game.   Move more at work Fit more activity into the workday. Stand during phone calls, use a printer farther from your desk, and get up to stretch each hour.    Do something new Develop a new skill to kick-start your motivation. Sign up for a class to learn how to  [employer], surf, do tai chi, or play a sport.    Keep cool in the pool Don't like to sweat? Hit the local community pool for a swim, water polo, or water aerobics class to stay cool while exercising.    Stay on track Use a fitness tracker (FITBIT, Fitness Pal mobile app) to track your activity and provide motivation to reach your goals.     Influenza (Flu) Vaccine (Inactivated or Recombinant): What You Need to Know 1. Why get vaccinated? Influenza vaccine can prevent influenza (flu). Flu is a contagious disease that spreads around the [place] every year, usually between October and May. Anyone can get the flu, but it is more dangerous for some people. Infants and young children, people 64 years of age and older, pregnant women, and people with certain health conditions or a weakened immune system are at greatest risk of flu complications. Pneumonia, bronchitis, sinus infections and ear infections are examples of flu-related complications. If you have a medical condition, such as heart disease, cancer or diabetes, flu can make it worse. Flu can cause fever and chills, sore throat, muscle aches, fatigue, cough, headache, and runny or stuffy nose. Some people may have vomiting and diarrhea, though this is more common in children than adults. Each  year thousands of people in the [place] die from flu, and many more are hospitalized. Flu vaccine prevents millions of illnesses and flu-related visits to the doctor each year. 2. Influenza vaccine CDC recommends everyone 88 months of age and older get vaccinated every flu season. Children 6 months through 57 years of age may need 2 doses during a single flu season. Everyone else needs only 1 dose each flu season. It takes about 2 weeks for protection to develop after vaccination. There are many flu viruses, and they are always changing. Each year a new flu vaccine is made to protect against three or four viruses that are likely to cause  disease in the upcoming flu season. Even when the vaccine doesn't exactly match these viruses, it may still provide some protection. Influenza vaccine does not cause flu. Influenza vaccine may be given at the same time as other vaccines. 3. Talk with your health care provider Tell your vaccine provider if the person getting the vaccine:  Has had an allergic reaction after a previous dose of influenza vaccine, or has any severe, life-threatening allergies.  Has ever had Guillain-Barr Syndrome (also called GBS). In some cases, your health care provider may decide to postpone influenza vaccination to a future visit. People with minor illnesses, such as a cold, may be vaccinated. People who are moderately or severely ill should usually wait until they recover before getting influenza vaccine. Your health care provider can give you more information. 4. Risks of a vaccine reaction  Soreness, redness, and swelling where shot is given, fever, muscle aches, and headache can happen after influenza vaccine.  There may be a very small increased risk of Guillain-Barr Syndrome (GBS) after inactivated influenza vaccine (the flu shot). Young children who get the flu shot along with pneumococcal vaccine (PCV13), and/or DTaP vaccine at the same time might be slightly more likely to have a seizure caused by fever. Tell your health care provider if a child who is getting flu vaccine has ever had a seizure. People sometimes faint after medical procedures, including vaccination. Tell your provider if you feel dizzy or have vision changes or ringing in the ears. As with any medicine, there is a very remote chance of a vaccine causing a severe allergic reaction, other serious injury, or death. 5. What if there is a serious problem? An allergic reaction could occur after the vaccinated person leaves the clinic. If you see signs of a severe allergic reaction (hives, swelling of the face and throat, difficulty  breathing, a fast heartbeat, dizziness, or weakness), call 9-1-1 and get the person to the nearest hospital. For other signs that concern you, call your health care provider. Adverse reactions should be reported to the Vaccine Adverse Event Reporting System (VAERS). Your health care provider will usually file this report, or you can do it yourself. Visit the VAERS website at www.vaers.SamedayNews.es or call 203-814-1828.VAERS is only for reporting reactions, and VAERS staff do not give medical advice. 6. The National Vaccine Injury Compensation Program The [employer] Vaccine Injury Compensation Program (VICP) is a federal program that was created to compensate people who may have been injured by certain vaccines. Visit the VICP website at GoldCloset.com.ee or call (201) 096-8505 to learn about the program and about filing a claim. There is a time limit to file a claim for compensation. 7. How can I learn more?  Ask your healthcare provider.  Call your local or state health department.  Contact the Centers for Disease Control and Prevention (  CDC): ? Call 626 001 1296 (1-800-CDC-INFO) or ? Visit CDC's https://gibson.com/ Vaccine Information Statement (Interim) Inactivated Influenza Vaccine (06/10/2018) This information is not intended to replace advice given to you by your health care provider. Make sure you discuss any questions you have with your health care provider. Document Revised: 02/01/2019 Document Reviewed: 06/14/2018 Elsevier Patient Education  [place].

## 2020-09-25 NOTE — Assessment & Plan Note (Signed)
Stable. Continue statin. Recheck lipid panel today.

## 2020-09-26 LAB — LIPID PANEL
Chol/HDL Ratio: 2.4 ratio (ref 0.0–5.0)
Cholesterol, Total: 141 mg/dL (ref 100–199)
HDL: 59 mg/dL (ref >39)
LDL Chol Calc (NIH): 68 mg/dL (ref 0–99)
Triglycerides: 68 mg/dL (ref 0–149)
VLDL Cholesterol Cal: 14 mg/dL (ref 5–40)

## 2020-09-26 LAB — BASIC METABOLIC PANEL
BUN/Creatinine Ratio: 18 (ref 10–24)
BUN: 16 mg/dL (ref 8–27)
CO2: 25 mmol/L (ref 20–29)
Calcium: 9.1 mg/dL (ref 8.6–10.2)
Chloride: 102 mmol/L (ref 96–106)
Creatinine, Ser: 0.89 mg/dL (ref 0.76–1.27)
GFR calc Af Amer: 98 mL/min/{1.73_m2} (ref >59)
GFR calc non Af Amer: 85 mL/min/{1.73_m2} (ref >59)
Glucose: 114 mg/dL — ABNORMAL HIGH (ref 65–99)
Potassium: 4.2 mmol/L (ref 3.5–5.2)
Sodium: 140 mmol/L (ref 134–144)

## 2020-09-28 ENCOUNTER — Telehealth: Payer: Self-pay | Admitting: Family Medicine

## 2020-09-28 NOTE — Telephone Encounter (Signed)
Patient's wife Drew Mcmahon called to advise that her husband Drew Mcmahon received a telephone call from someone calling from "[facility]" stating the flu shot they received was no good.  Patient's wife is concerned and would like to know what the issue is with their flu shot.  Explained to caller that I was unaware of an issue with the flu shot and that no one from our office had called. Requested caller call the number back that called her husband and  It was the [place]. Advised patient that I would investigate and call back.

## 2021-01-21 DIAGNOSIS — L853 Xerosis cutis: Secondary | ICD-10-CM | POA: Diagnosis not present

## 2021-01-21 DIAGNOSIS — L578 Other skin changes due to chronic exposure to nonionizing radiation: Secondary | ICD-10-CM | POA: Diagnosis not present

## 2021-01-21 DIAGNOSIS — Z872 Personal history of diseases of the skin and subcutaneous tissue: Secondary | ICD-10-CM | POA: Diagnosis not present

## 2021-01-21 DIAGNOSIS — I781 Nevus, non-neoplastic: Secondary | ICD-10-CM | POA: Diagnosis not present

## 2021-07-23 DIAGNOSIS — H18613 Keratoconus, stable, bilateral: Secondary | ICD-10-CM | POA: Diagnosis not present

## 2021-07-23 DIAGNOSIS — H2513 Age-related nuclear cataract, bilateral: Secondary | ICD-10-CM | POA: Diagnosis not present

## 2021-07-23 DIAGNOSIS — H35373 Puckering of macula, bilateral: Secondary | ICD-10-CM | POA: Diagnosis not present

## 2021-09-02 ENCOUNTER — Ambulatory Visit: Payer: Medicare Other

## 2022-01-03 ENCOUNTER — Ambulatory Visit (INDEPENDENT_AMBULATORY_CARE_PROVIDER_SITE_OTHER): Payer: Medicare Other | Admitting: Nurse Practitioner

## 2022-01-03 ENCOUNTER — Other Ambulatory Visit: Payer: Self-pay

## 2022-01-03 ENCOUNTER — Encounter: Payer: Self-pay | Admitting: Nurse Practitioner

## 2022-01-03 VITALS — BP 178/67 | HR 54 | Temp 97.8°F | Ht 72.0 in | Wt 224.4 lb

## 2022-01-03 DIAGNOSIS — I700 Atherosclerosis of aorta: Secondary | ICD-10-CM | POA: Diagnosis not present

## 2022-01-03 DIAGNOSIS — R7303 Prediabetes: Secondary | ICD-10-CM

## 2022-01-03 DIAGNOSIS — E785 Hyperlipidemia, unspecified: Secondary | ICD-10-CM

## 2022-01-03 DIAGNOSIS — I10 Essential (primary) hypertension: Secondary | ICD-10-CM

## 2022-01-03 MED ORDER — ATORVASTATIN CALCIUM 20 MG PO TABS: 20.0000 mg | ORAL_TABLET | Freq: Every day | ORAL | 0 refills | Status: DC

## 2022-01-03 MED ORDER — VALSARTAN-HYDROCHLOROTHIAZIDE 160-25 MG PO TABS: 1.0000 | ORAL_TABLET | Freq: Every day | ORAL | 0 refills | Status: DC

## 2022-01-03 MED ORDER — METOPROLOL SUCCINATE ER 50 MG PO TB24: ORAL_TABLET | ORAL | 0 refills | Status: DC

## 2022-01-03 NOTE — Progress Notes (Signed)
? ?BP (!) 178/67   Pulse (!) 54   Temp 97.8 ?F (36.6 ?C) (Oral)   Ht 6' (1.829 m)   Wt 224 lb 6.4 oz (101.8 kg)   SpO2 97%   BMI 30.43 kg/m?   ? ?Subjective:  ? ? Patient ID: Drew Mcmahon, male    DOB: 05-Jul-1946, 76 y.o.   MRN: [id number] ? ?HPI: ?Drew Mcmahon is a 76 y.o. male ? ?Chief Complaint  ?Patient presents with  ? Hyperlipidemia  ? Hypertension  ? ?HYPERTENSION / HYPERLIPIDEMIA ?Satisfied with current treatment? yes ?Duration of hypertension: years ?BP monitoring frequency: daily ?BP range: 120-130/70- has been out of his medication and running 150s/80s ?BP medication side effects: no ?Past BP meds: valsartan-HCTZ and Metoprolol ?Duration of hyperlipidemia: years ?Cholesterol medication side effects: no ?Cholesterol supplements: none ?Past cholesterol medications: atorvastain (lipitor) ?Medication compliance: excellent compliance ?Aspirin: no ?Recent stressors: no ?Recurrent headaches: no ?Visual changes: no ?Palpitations: no ?Dyspnea: no ?Chest pain: no ?Lower extremity edema: no ?Dizzy/lightheaded: no ? ? ?Relevant past medical, surgical, family and social history reviewed and updated as indicated. Interim medical history since our last visit reviewed. ?Allergies and medications reviewed and updated. ? ?Review of Systems  ?Eyes:  Negative for visual disturbance.  ?Respiratory:  Negative for chest tightness and shortness of breath.   ?Cardiovascular:  Negative for chest pain, palpitations and leg swelling.  ?Neurological:  Negative for dizziness, light-headedness and headaches.  ? ?Per HPI unless specifically indicated above ? ?   ?Objective:  ?  ?BP (!) 178/67   Pulse (!) 54   Temp 97.8 ?F (36.6 ?C) (Oral)   Ht 6' (1.829 m)   Wt 224 lb 6.4 oz (101.8 kg)   SpO2 97%   BMI 30.43 kg/m?   ?Wt Readings from Last 3 Encounters:  ?01/03/22 224 lb 6.4 oz (101.8 kg)  ?09/25/20 220 lb 6.4 oz (100 kg)  ?08/31/20 222 lb (100.7 kg)  ?  ?Physical Exam ?Vitals and nursing note reviewed.  ?Constitutional:    ?   General: He is not in acute distress. ?   Appearance: Normal appearance. He is not ill-appearing, toxic-appearing or diaphoretic.  ?HENT:  ?   Head: Normocephalic.  ?   Right Ear: External ear normal.  ?   Left Ear: External ear normal.  ?   Nose: Nose normal. No congestion or rhinorrhea.  ?   Mouth/Throat:  ?   Mouth: Mucous membranes are moist.  ?Eyes:  ?   General:     ?   Right eye: No discharge.     ?   Left eye: No discharge.  ?   Extraocular Movements: Extraocular movements intact.  ?   Conjunctiva/sclera: Conjunctivae normal.  ?   Pupils: Pupils are equal, round, and reactive to light.  ?Cardiovascular:  ?   Rate and Rhythm: Normal rate and regular rhythm.  ?   Heart sounds: No murmur heard. ?Pulmonary:  ?   Effort: Pulmonary effort is normal. No respiratory distress.  ?   Breath sounds: Normal breath sounds. No wheezing, rhonchi or rales.  ?Abdominal:  ?   General: Abdomen is flat. Bowel sounds are normal.  ?Musculoskeletal:  ?   Cervical back: Normal range of motion and neck supple.  ?Skin: ?   General: Skin is warm and dry.  ?   Capillary Refill: Capillary refill takes less than 2 seconds.  ?Neurological:  ?   General: No focal deficit present.  ?   Mental Status: He  is alert and oriented to person, place, and time.  ?Psychiatric:     ?   Mood and Affect: Mood normal.     ?   Behavior: Behavior normal.     ?   Thought Content: Thought content normal.     ?   Judgment: Judgment normal.  ? ? ?Results for orders placed or performed in visit on 09/25/20  ?Bayer DCA Hb A1c Waived (STAT)  ?Result Value Ref Range  ? HB A1C (BAYER DCA - WAIVED) 5.6 <7.0 %  ?Lipid panel  ?Result Value Ref Range  ? Cholesterol, Total 141 100 - 199 mg/dL  ? Triglycerides 68 0 - 149 mg/dL  ? HDL 59 >39 mg/dL  ? VLDL Cholesterol Cal 14 5 - 40 mg/dL  ? LDL Chol Calc (NIH) 68 0 - 99 mg/dL  ? Chol/HDL Ratio 2.4 0.0 - 5.0 ratio  ?Basic Metabolic Panel (BMET)  ?Result Value Ref Range  ? Glucose 114 (H) 65 - 99 mg/dL  ? BUN 16 8 - 27  mg/dL  ? Creatinine, Ser 0.89 0.76 - 1.27 mg/dL  ? GFR calc non Af Amer 85 >59 mL/min/1.73  ? GFR calc Af Amer 98 >59 mL/min/1.73  ? BUN/Creatinine Ratio 18 10 - 24  ? Sodium 140 134 - 144 mmol/L  ? Potassium 4.2 3.5 - 5.2 mmol/L  ? Chloride 102 96 - 106 mmol/L  ? CO2 25 20 - 29 mmol/L  ? Calcium 9.1 8.6 - 10.2 mg/dL  ? ?   ?Assessment & Plan:  ? ?Problem List Items Addressed This Visit   ? ?  ? Cardiovascular and Mediastinum  ? Hypertension (Chronic)  ?  Chronic.  Not well controlled.  Has been out of Diovan with HCTZ.  Will refill medication at visit today.  Labs ordered today.  Return to clinic in 1 months for reevaluation.  Call sooner if concerns arise.  ? ?  ?  ? Relevant Medications  ? atorvastatin (LIPITOR) 20 MG tablet  ? metoprolol succinate (TOPROL-XL) 50 MG 24 hr tablet  ? valsartan-hydrochlorothiazide (DIOVAN-HCT) 160-25 MG tablet  ? Other Relevant Orders  ? Comp Met (CMET)  ? Aortic calcification (HCC) - Primary  ?  Continue statin. Recheck lipid panel today. Will make recommendations based on lab results.  Refills sent today.  ?  ?  ? Relevant Medications  ? atorvastatin (LIPITOR) 20 MG tablet  ? metoprolol succinate (TOPROL-XL) 50 MG 24 hr tablet  ? valsartan-hydrochlorothiazide (DIOVAN-HCT) 160-25 MG tablet  ?  ? Other  ? Hyperlipidemia (Chronic)  ?  Chronic.  Controlled.  Continue with current medication regimen of Atorvastatin 53m daily.  Refill sent today.  Labs ordered today.  Return to clinic in 6 months for reevaluation.  Call sooner if concerns arise.  ? ?  ?  ? Relevant Medications  ? atorvastatin (LIPITOR) 20 MG tablet  ? metoprolol succinate (TOPROL-XL) 50 MG 24 hr tablet  ? valsartan-hydrochlorothiazide (DIOVAN-HCT) 160-25 MG tablet  ? Other Relevant Orders  ? Lipid Profile  ? Prediabetes  ?  Labs ordered today. Will make recommendations based on lab results. ?  ?  ? Relevant Orders  ? HgB A1c  ? ?Other Visit Diagnoses   ? ? Essential hypertension      ? Relevant Medications  ?  atorvastatin (LIPITOR) 20 MG tablet  ? metoprolol succinate (TOPROL-XL) 50 MG 24 hr tablet  ? valsartan-hydrochlorothiazide (DIOVAN-HCT) 160-25 MG tablet  ? ?  ?  ?Patient declined physical exam.  ? ?  Follow up plan: ?Return in about 1 month (around 02/03/2022) for BP Check. ? ? ? ? ? ? ?

## 2022-01-03 NOTE — Assessment & Plan Note (Signed)
Chronic.  Controlled.  Continue with current medication regimen of Atorvastatin '20mg'$  daily.  Refill sent today.  Labs ordered today.  Return to clinic in 6 months for reevaluation.  Call sooner if concerns arise.  ? ?

## 2022-01-03 NOTE — Assessment & Plan Note (Signed)
Labs ordered today.  Will make recommendations based on lab results. ?

## 2022-01-03 NOTE — Assessment & Plan Note (Signed)
Chronic.  Not well controlled.  Has been out of Diovan with HCTZ.  Will refill medication at visit today.  Labs ordered today.  Return to clinic in 1 months for reevaluation.  Call sooner if concerns arise.  ? ?

## 2022-01-03 NOTE — Assessment & Plan Note (Signed)
Continue statin. Recheck lipid panel today. Will make recommendations based on lab results.  Refills sent today.  ?

## 2022-01-03 NOTE — Progress Notes (Deleted)
? ?  There were no vitals taken for this visit.  ? ?Subjective:  ? ? Patient ID: Drew Mcmahon, male    DOB: 10-22-1946, 76 y.o.   MRN: [id number] ? ?HPI: ?Drew Mcmahon is a 76 y.o. male ? ?No chief complaint on file. ? ?HYPERTENSION / HYPERLIPIDEMIA ?Satisfied with current treatment? {Blank single:19197::"yes","no"} ?Duration of hypertension: {Blank single:19197::"chronic","months","years"} ?BP monitoring frequency: {Blank single:19197::"not checking","rarely","daily","weekly","monthly","a few times a day","a few times a week","a few times a month"} ?BP range:  ?BP medication side effects: {Blank single:19197::"yes","no"} ?Past BP meds: {Blank IDHWYSHU:83729::"MSXJ","DBZMCEYEMV","VKPQAESLPN/PYYFRTMYTR","ZNBVAPOL","IDCVUDTHYH","OOILNZVJKQ/ASUO","RVIFBPPHKF (bystolic)","carvedilol","chlorthalidone","clonidine","diltiazem","exforge HCT","HCTZ","irbesartan (avapro)","labetalol","lisinopril","lisinopril-HCTZ","losartan (cozaar)","methyldopa","nifedipine","olmesartan (benicar)","olmesartan-HCTZ","quinapril","ramipril","spironalactone","tekturna","valsartan","valsartan-HCTZ","verapamil"} ?Duration of hyperlipidemia: {Blank single:19197::"chronic","months","years"} ?Cholesterol medication side effects: {Blank single:19197::"yes","no"} ?Cholesterol supplements: {Blank multiple:19196::"none","fish oil","niacin","red yeast rice"} ?Past cholesterol medications: {Blank multiple:19196::"none","atorvastain (lipitor)","lovastatin (mevacor)","pravastatin (pravachol)","rosuvastatin (crestor)","simvastatin (zocor)","vytorin","fenofibrate (tricor)","gemfibrozil","ezetimide (zetia)","niaspan","lovaza"} ?Medication compliance: {Blank single:19197::"excellent compliance","good compliance","fair compliance","poor compliance"} ?Aspirin: {Blank single:19197::"yes","no"} ?Recent stressors: {Blank single:19197::"yes","no"} ?Recurrent headaches: {Blank single:19197::"yes","no"} ?Visual changes: {Blank single:19197::"yes","no"} ?Palpitations:  {Blank single:19197::"yes","no"} ?Dyspnea: {Blank single:19197::"yes","no"} ?Chest pain: {Blank single:19197::"yes","no"} ?Lower extremity edema: {Blank single:19197::"yes","no"} ?Dizzy/lightheaded: {Blank single:19197::"yes","no"} ? ?Relevant past medical, surgical, family and social history reviewed and updated as indicated. Interim medical history since our last visit reviewed. ?Allergies and medications reviewed and updated. ? ?Review of Systems ? ?Per HPI unless specifically indicated above ? ?   ?Objective:  ?  ?There were no vitals taken for this visit.  ?Wt Readings from Last 3 Encounters:  ?09/25/20 220 lb 6.4 oz (100 kg)  ?08/31/20 222 lb (100.7 kg)  ?01/18/20 223 lb (101.2 kg)  ?  ?Physical Exam ? ?Results for orders placed or performed in visit on 09/25/20  ?Bayer DCA Hb A1c Waived (STAT)  ?Result Value Ref Range  ? HB A1C (BAYER DCA - WAIVED) 5.6 <7.0 %  ?Lipid panel  ?Result Value Ref Range  ? Cholesterol, Total 141 100 - 199 mg/dL  ? Triglycerides 68 0 - 149 mg/dL  ? HDL 59 >39 mg/dL  ? VLDL Cholesterol Cal 14 5 - 40 mg/dL  ? LDL Chol Calc (NIH) 68 0 - 99 mg/dL  ? Chol/HDL Ratio 2.4 0.0 - 5.0 ratio  ?Basic Metabolic Panel (BMET)  ?Result Value Ref Range  ? Glucose 114 (H) 65 - 99 mg/dL  ? BUN 16 8 - 27 mg/dL  ? Creatinine, Ser 0.89 0.76 - 1.27 mg/dL  ? GFR calc non Af Amer 85 >59 mL/min/1.73  ? GFR calc Af Amer 98 >59 mL/min/1.73  ? BUN/Creatinine Ratio 18 10 - 24  ? Sodium 140 134 - 144 mmol/L  ? Potassium 4.2 3.5 - 5.2 mmol/L  ? Chloride 102 96 - 106 mmol/L  ? CO2 25 20 - 29 mmol/L  ? Calcium 9.1 8.6 - 10.2 mg/dL  ? ?   ?Assessment & Plan:  ? ?Problem List Items Addressed This Visit   ? ?  ? Cardiovascular and Mediastinum  ? Hypertension - Primary (Chronic)  ? Aortic calcification (HCC)  ?  ? Other  ? Hyperlipidemia (Chronic)  ?  ? ?Follow up plan: ?No follow-ups on file. ? ? ? ? ? ?

## 2022-01-04 LAB — COMPREHENSIVE METABOLIC PANEL
ALT: 18 IU/L (ref 0–44)
AST: 23 IU/L (ref 0–40)
Albumin/Globulin Ratio: 1.6 (ref 1.2–2.2)
Albumin: 4.1 g/dL (ref 3.7–4.7)
Alkaline Phosphatase: 113 IU/L (ref 44–121)
BUN/Creatinine Ratio: 17 (ref 10–24)
BUN: 16 mg/dL (ref 8–27)
Bilirubin Total: 0.9 mg/dL (ref 0.0–1.2)
CO2: 21 mmol/L (ref 20–29)
Calcium: 9.6 mg/dL (ref 8.6–10.2)
Chloride: 100 mmol/L (ref 96–106)
Creatinine, Ser: 0.93 mg/dL (ref 0.76–1.27)
Globulin, Total: 2.6 g/dL (ref 1.5–4.5)
Glucose: 124 mg/dL — ABNORMAL HIGH (ref 70–99)
Potassium: 4.6 mmol/L (ref 3.5–5.2)
Sodium: 139 mmol/L (ref 134–144)
Total Protein: 6.7 g/dL (ref 6.0–8.5)
eGFR: 86 mL/min/{1.73_m2} (ref >59)

## 2022-01-04 LAB — HEMOGLOBIN A1C
Est. average glucose Bld gHb Est-mCnc: 111 mg/dL
Hgb A1c MFr Bld: 5.5 % (ref 4.8–5.6)

## 2022-01-04 LAB — LIPID PANEL
Chol/HDL Ratio: 2 ratio (ref 0.0–5.0)
Cholesterol, Total: 128 mg/dL (ref 100–199)
HDL: 63 mg/dL (ref >39)
LDL Chol Calc (NIH): 53 mg/dL (ref 0–99)
Triglycerides: 51 mg/dL (ref 0–149)
VLDL Cholesterol Cal: 12 mg/dL (ref 5–40)

## 2022-01-06 NOTE — Progress Notes (Signed)
Hi Drew Mcmahon. It was nice to meet you last week.  Your lab work looks good.  No concerns at this time. Continue with your current medication regimen.  Follow up as discussed.  Please let me know if you have any questions.  ?

## 2022-01-21 DIAGNOSIS — L853 Xerosis cutis: Secondary | ICD-10-CM | POA: Diagnosis not present

## 2022-01-21 DIAGNOSIS — L570 Actinic keratosis: Secondary | ICD-10-CM | POA: Diagnosis not present

## 2022-01-21 DIAGNOSIS — L578 Other skin changes due to chronic exposure to nonionizing radiation: Secondary | ICD-10-CM | POA: Diagnosis not present

## 2022-01-21 DIAGNOSIS — Z872 Personal history of diseases of the skin and subcutaneous tissue: Secondary | ICD-10-CM | POA: Diagnosis not present

## 2022-02-12 NOTE — Progress Notes (Signed)
? ?BP 125/73   Pulse (!) 53   Temp 98.3 ?F (36.8 ?C) (Oral)   Wt 218 lb 12.8 oz (99.2 kg)   SpO2 98%   BMI 29.67 kg/m?   ? ?Subjective:  ? ? Patient ID: Drew Mcmahon, male    DOB: 03-21-1946, 76 y.o.   MRN: [id number] ? ?HPI: ?Drew Mcmahon is a 76 y.o. male ? ?Chief Complaint  ?Patient presents with  ? Hypertension  ? ?HYPERTENSION ?Hypertension status: controlled  ?Satisfied with current treatment? no ?Duration of hypertension: years ?BP monitoring frequency:  daily ?BP range: 125/70 ?BP medication side effects:  no ?Medication compliance: excellent compliance ?Previous BP meds:valsartan-HCTZ ?Aspirin: no ?Recurrent headaches: no ?Visual changes: no ?Palpitations: no ?Dyspnea: no ?Chest pain: no ?Lower extremity edema: no ?Dizzy/lightheaded: no ? ?Patient states he doesn't sleep well.  He takes Tylenol PM which helps him sleep. ? ?Relevant past medical, surgical, family and social history reviewed and updated as indicated. Interim medical history since our last visit reviewed. ?Allergies and medications reviewed and updated. ? ?Review of Systems  ?Eyes:  Negative for visual disturbance.  ?Respiratory:  Negative for shortness of breath.   ?Cardiovascular:  Negative for chest pain and leg swelling.  ?Neurological:  Negative for light-headedness and headaches.  ? ?Per HPI unless specifically indicated above ? ?   ?Objective:  ?  ?BP 125/73   Pulse (!) 53   Temp 98.3 ?F (36.8 ?C) (Oral)   Wt 218 lb 12.8 oz (99.2 kg)   SpO2 98%   BMI 29.67 kg/m?   ?Wt Readings from Last 3 Encounters:  ?02/13/22 218 lb 12.8 oz (99.2 kg)  ?01/03/22 224 lb 6.4 oz (101.8 kg)  ?09/25/20 220 lb 6.4 oz (100 kg)  ?  ?Physical Exam ?Vitals and nursing note reviewed.  ?Constitutional:   ?   General: He is not in acute distress. ?   Appearance: Normal appearance. He is not ill-appearing, toxic-appearing or diaphoretic.  ?HENT:  ?   Head: Normocephalic.  ?   Right Ear: External ear normal.  ?   Left Ear: External ear normal.  ?   Nose:  Nose normal. No congestion or rhinorrhea.  ?   Mouth/Throat:  ?   Mouth: Mucous membranes are moist.  ?Eyes:  ?   General:     ?   Right eye: No discharge.     ?   Left eye: No discharge.  ?   Extraocular Movements: Extraocular movements intact.  ?   Conjunctiva/sclera: Conjunctivae normal.  ?   Pupils: Pupils are equal, round, and reactive to light.  ?Cardiovascular:  ?   Rate and Rhythm: Normal rate and regular rhythm.  ?   Heart sounds: No murmur heard. ?Pulmonary:  ?   Effort: Pulmonary effort is normal. No respiratory distress.  ?   Breath sounds: Normal breath sounds. No wheezing, rhonchi or rales.  ?Abdominal:  ?   General: Abdomen is flat. Bowel sounds are normal.  ?Musculoskeletal:  ?   Cervical back: Normal range of motion and neck supple.  ?Skin: ?   General: Skin is warm and dry.  ?   Capillary Refill: Capillary refill takes less than 2 seconds.  ?Neurological:  ?   General: No focal deficit present.  ?   Mental Status: He is alert and oriented to person, place, and time.  ?Psychiatric:     ?   Mood and Affect: Mood normal.     ?   Behavior: Behavior normal.     ?  Thought Content: Thought content normal.     ?   Judgment: Judgment normal.  ? ? ?Results for orders placed or performed in visit on 01/03/22  ?HgB A1c  ?Result Value Ref Range  ? Hgb A1c MFr Bld 5.5 4.8 - 5.6 %  ? Est. average glucose Bld gHb Est-mCnc 111 mg/dL  ?Comp Met (CMET)  ?Result Value Ref Range  ? Glucose 124 (H) 70 - 99 mg/dL  ? BUN 16 8 - 27 mg/dL  ? Creatinine, Ser 0.93 0.76 - 1.27 mg/dL  ? eGFR 86 >59 mL/min/1.73  ? BUN/Creatinine Ratio 17 10 - 24  ? Sodium 139 134 - 144 mmol/L  ? Potassium 4.6 3.5 - 5.2 mmol/L  ? Chloride 100 96 - 106 mmol/L  ? CO2 21 20 - 29 mmol/L  ? Calcium 9.6 8.6 - 10.2 mg/dL  ? Total Protein 6.7 6.0 - 8.5 g/dL  ? Albumin 4.1 3.7 - 4.7 g/dL  ? Globulin, Total 2.6 1.5 - 4.5 g/dL  ? Albumin/Globulin Ratio 1.6 1.2 - 2.2  ? Bilirubin Total 0.9 0.0 - 1.2 mg/dL  ? Alkaline Phosphatase 113 44 - 121 IU/L  ? AST 23  0 - 40 IU/L  ? ALT 18 0 - 44 IU/L  ?Lipid Profile  ?Result Value Ref Range  ? Cholesterol, Total 128 100 - 199 mg/dL  ? Triglycerides 51 0 - 149 mg/dL  ? HDL 63 >39 mg/dL  ? VLDL Cholesterol Cal 12 5 - 40 mg/dL  ? LDL Chol Calc (NIH) 53 0 - 99 mg/dL  ? Chol/HDL Ratio 2.0 0.0 - 5.0 ratio  ? ?   ?Assessment & Plan:  ? ?Problem List Items Addressed This Visit   ? ?  ? Cardiovascular and Mediastinum  ? Hypertension - Primary (Chronic)  ?  Chronic.  Controlled.  Elevated at visit.  But checks regularly at home and running 120s/70s consistently. Continue with current medication regimen of Valsartan/HCTZ.  Return to clinic in 5 months for reevaluation.  Call sooner if concerns arise.  ? ? ?  ?  ? Relevant Medications  ? valsartan-hydrochlorothiazide (DIOVAN-HCT) 160-25 MG tablet  ? ?Other Visit Diagnoses   ? ? Essential hypertension      ? Relevant Medications  ? valsartan-hydrochlorothiazide (DIOVAN-HCT) 160-25 MG tablet  ? ?  ?  ? ?Follow up plan: ?Return in about 5 months (around 07/16/2022) for HTN, HLD, DM2 FU. ? ? ? ? ? ?

## 2022-02-13 ENCOUNTER — Encounter: Payer: Self-pay | Admitting: Nurse Practitioner

## 2022-02-13 ENCOUNTER — Ambulatory Visit (INDEPENDENT_AMBULATORY_CARE_PROVIDER_SITE_OTHER): Payer: Medicare Other | Admitting: Nurse Practitioner

## 2022-02-13 VITALS — BP 125/73 | HR 53 | Temp 98.3°F | Wt 218.8 lb

## 2022-02-13 DIAGNOSIS — I10 Essential (primary) hypertension: Secondary | ICD-10-CM | POA: Diagnosis not present

## 2022-02-13 MED ORDER — VALSARTAN-HYDROCHLOROTHIAZIDE 160-25 MG PO TABS: 1.0000 | ORAL_TABLET | Freq: Every day | ORAL | 1 refills | Status: DC

## 2022-02-13 NOTE — Patient Instructions (Signed)
Benadryl

## 2022-02-13 NOTE — Assessment & Plan Note (Signed)
Chronic.  Controlled.  Elevated at visit.  But checks regularly at home and running 120s/70s consistently. Continue with current medication regimen of Valsartan/HCTZ.  Return to clinic in 5 months for reevaluation.  Call sooner if concerns arise.  ? ?

## 2022-03-06 ENCOUNTER — Other Ambulatory Visit: Payer: Self-pay | Admitting: Nurse Practitioner

## 2022-03-06 DIAGNOSIS — E785 Hyperlipidemia, unspecified: Secondary | ICD-10-CM

## 2022-03-06 DIAGNOSIS — I10 Essential (primary) hypertension: Secondary | ICD-10-CM

## 2022-03-06 NOTE — Telephone Encounter (Signed)
Requested Prescriptions  ?Pending Prescriptions Disp Refills  ?? metoprolol succinate (TOPROL-XL) 50 MG 24 hr tablet [Pharmacy Med Name: Metoprolol Succinate ER 50 MG Oral Tablet Extended Release 24 Hour] 90 tablet 1  ?  Sig: TAKE 1 TABLET BY MOUTH ONCE  DAILY TAKE WITH OR IMMEDIATELY  FOLLOWING A MEAL  ?  ? Cardiovascular:  Beta Blockers Passed - 03/06/2022  7:11 AM  ?  ?  Passed - Last BP in normal range  ?  BP Readings from Last 1 Encounters:  ?02/13/22 125/73  ?   ?  ?  Passed - Last Heart Rate in normal range  ?  Pulse Readings from Last 1 Encounters:  ?02/13/22 (!) 53  ?   ?  ?  Passed - Valid encounter within last 6 months  ?  Recent Outpatient Visits   ?      ? 3 weeks ago Primary hypertension  ? [place], NP  ? 2 months ago Aortic calcification ([place])  ? [place], NP  ? 1 year ago Need for influenza vaccination  ? [facility] Myles Gip, DO  ? 2 years ago Essential hypertension  ? [place], [place], [place]  ? 2 years ago Essential hypertension  ? [place], [place]  ?  ?  ?Future Appointments   ?        ? In 4 months Jon Billings, NP [facility]  ?  ? ?  ?  ?  ?? atorvastatin (LIPITOR) 20 MG tablet [Pharmacy Med Name: Atorvastatin Calcium 20 MG Oral Tablet] 90 tablet 1  ?  Sig: TAKE 1 TABLET BY MOUTH AT  BEDTIME  ?  ? Cardiovascular:  Antilipid - Statins Failed - 03/06/2022  7:11 AM  ?  ?  Failed - Lipid Panel in normal range within the last 12 months  ?  Cholesterol, Total  ?Date Value Ref Range Status  ?01/03/2022 128 100 - 199 mg/dL Final  ? ?Cholesterol Piccolo, [place]  ?Date Value Ref Range Status  ?03/04/2016 205 (H) <200 mg/dL Final  ?  Comment:  ?                          Desirable                <200 ?                        Borderline High      200- 239 ?                        High                     >239 ?  ? ?LDL Cholesterol  (Calc)  ?Date Value Ref Range Status  ?07/29/2019 72 mg/dL (calc) Final  ?  Comment:  ?  Reference range: <100 ?Marland Kitchen ?Desirable range <100 mg/dL for primary prevention;   ?<70 mg/dL for patients with CHD or diabetic patients  ?with > or = 2 CHD risk factors. ?. ?LDL-C is now calculated using the Martin-Hopkins  ?calculation, which is a validated novel method providing  ?better accuracy than the Friedewald equation in the  ?estimation of LDL-C.  ?Cresenciano Genre et al. Annamaria Helling. 5621;308(65): 2061-2068  ?(http://education.[employer].com/faq/FAQ164) ?  ? ?LDL Chol Calc (NIH)  ?  Date Value Ref Range Status  ?01/03/2022 53 0 - 99 mg/dL Final  ? ?HDL  ?Date Value Ref Range Status  ?01/03/2022 63 >39 mg/dL Final  ? ?Triglycerides  ?Date Value Ref Range Status  ?01/03/2022 51 0 - 149 mg/dL Final  ? ?Triglycerides Piccolo,Waived  ?Date Value Ref Range Status  ?03/04/2016 89 <150 mg/dL Final  ?  Comment:  ?                          Normal                   <150 ?                        Borderline High     150 - 199 ?                        High                200 - 499 ?                        Very High                >499 ?  ? ?  ?  ?  Passed - Patient is not pregnant  ?  ?  Passed - Valid encounter within last 12 months  ?  Recent Outpatient Visits   ?      ? 3 weeks ago Primary hypertension  ? [place], NP  ? 2 months ago Aortic calcification ([place])  ? [place], NP  ? 1 year ago Need for influenza vaccination  ? [facility] Myles Gip, DO  ? 2 years ago Essential hypertension  ? [place], [place], [place]  ? 2 years ago Essential hypertension  ? [place], [place]  ?  ?  ?Future Appointments   ?        ? In 4 months Jon Billings, NP [facility], [facility]  ?  ? ?  ?  ?  ? ? ?

## 2022-04-17 ENCOUNTER — Telehealth: Payer: Self-pay

## 2022-04-17 ENCOUNTER — Ambulatory Visit (INDEPENDENT_AMBULATORY_CARE_PROVIDER_SITE_OTHER): Payer: Medicare Other | Admitting: *Deleted

## 2022-04-17 ENCOUNTER — Other Ambulatory Visit: Payer: Self-pay

## 2022-04-17 DIAGNOSIS — Z1211 Encounter for screening for malignant neoplasm of colon: Secondary | ICD-10-CM

## 2022-04-17 DIAGNOSIS — Z0000 Encounter for general adult medical examination without abnormal findings: Secondary | ICD-10-CM | POA: Diagnosis not present

## 2022-04-17 DIAGNOSIS — Z86010 Personal history of colonic polyps: Secondary | ICD-10-CM

## 2022-04-17 MED ORDER — NA SULFATE-K SULFATE-MG SULF 17.5-3.13-1.6 GM/177ML PO SOLN: 1.0000 | Freq: Once | ORAL | 0 refills | Status: AC

## 2022-04-17 NOTE — Patient Instructions (Signed)
Drew Mcmahon , Thank you for taking time to come for your Medicare Wellness Visit. I appreciate your ongoing commitment to your health goals. Please review the following plan we discussed and let me know if I can assist you in the future.   Screening recommendations/referrals: Colonoscopy: Education provided Recommended yearly ophthalmology/optometry visit for glaucoma screening and checkup Recommended yearly dental visit for hygiene and checkup  Vaccinations: Influenza vaccine: Education provided Pneumococcal vaccine: up to date Tdap vaccine: Education provided Shingles vaccine: Education provided    Advanced directives: not on file  Conditions/risks identified:   Next appointment: 07-16-2022 @ 10:20  [facility] 13 Years and Older, Male Preventive care refers to lifestyle choices and visits with your health care provider that can promote health and wellness. What does preventive care include? A yearly physical exam. This is also called an annual well check. Dental exams once or twice a year. Routine eye exams. Ask your health care provider how often you should have your eyes checked. Personal lifestyle choices, including: Daily care of your teeth and gums. Regular physical activity. Eating a healthy diet. Avoiding tobacco and drug use. Limiting alcohol use. Practicing safe sex. Taking low doses of aspirin every day. Taking vitamin and mineral supplements as recommended by your health care provider. What happens during an annual well check? The services and screenings done by your health care provider during your annual well check will depend on your age, overall health, lifestyle risk factors, and family history of disease. Counseling  Your health care provider may ask you questions about your: Alcohol use. Tobacco use. Drug use. Emotional well-being. Home and relationship well-being. Sexual activity. Eating habits. History of falls. Memory and ability to  understand (cognition). Work and work Statistician. Screening  You may have the following tests or measurements: Height, weight, and BMI. Blood pressure. Lipid and cholesterol levels. These may be checked every 5 years, or more frequently if you are over 26 years old. Skin check. Lung cancer screening. You may have this screening every year starting at age 43 if you have a 30-pack-year history of smoking and currently smoke or have quit within the past 15 years. Fecal occult blood test (FOBT) of the stool. You may have this test every year starting at age 66. Flexible sigmoidoscopy or colonoscopy. You may have a sigmoidoscopy every 5 years or a colonoscopy every 10 years starting at age 80. Prostate cancer screening. Recommendations will vary depending on your family history and other risks. Hepatitis C blood test. Hepatitis B blood test. Sexually transmitted disease (STD) testing. Diabetes screening. This is done by checking your blood sugar (glucose) after you have not eaten for a while (fasting). You may have this done every 1-3 years. Abdominal aortic aneurysm (AAA) screening. You may need this if you are a current or former smoker. Osteoporosis. You may be screened starting at age 38 if you are at high risk. Talk with your health care provider about your test results, treatment options, and if necessary, the need for more tests. Vaccines  Your health care provider may recommend certain vaccines, such as: Influenza vaccine. This is recommended every year. Tetanus, diphtheria, and acellular pertussis (Tdap, Td) vaccine. You may need a Td booster every 10 years. Zoster vaccine. You may need this after age 57. Pneumococcal 13-valent conjugate (PCV13) vaccine. One dose is recommended after age 44. Pneumococcal polysaccharide (PPSV23) vaccine. One dose is recommended after age 75. Talk to your health care provider about which screenings and vaccines you  need and how often you need them. This  information is not intended to replace advice given to you by your health care provider. Make sure you discuss any questions you have with your health care provider. Document Released: 11/09/2015 Document Revised: 07/02/2016 Document Reviewed: 08/14/2015 Elsevier Interactive Patient Education  2017 [place] Prevention in the Home Falls can cause injuries. They can happen to people of all ages. There are many things you can do to make your home safe and to help prevent falls. What can I do on the outside of my home? Regularly fix the edges of walkways and driveways and fix any cracks. Remove anything that might make you trip as you walk through a door, such as a raised step or threshold. Trim any bushes or trees on the path to your home. Use bright outdoor lighting. Clear any walking paths of anything that might make someone trip, such as rocks or tools. Regularly check to see if handrails are loose or broken. Make sure that both sides of any steps have handrails. Any raised decks and porches should have guardrails on the edges. Have any leaves, snow, or ice cleared regularly. Use sand or salt on walking paths during winter. Clean up any spills in your garage right away. This includes oil or grease spills. What can I do in the bathroom? Use night lights. Install grab bars by the toilet and in the tub and shower. Do not use towel bars as grab bars. Use non-skid mats or decals in the tub or shower. If you need to sit down in the shower, use a plastic, non-slip stool. Keep the floor dry. Clean up any water that spills on the floor as soon as it happens. Remove soap buildup in the tub or shower regularly. Attach bath mats securely with double-sided non-slip rug tape. Do not have throw rugs and other things on the floor that can make you trip. What can I do in the bedroom? Use night lights. Make sure that you have a light by your bed that is easy to reach. Do not use any sheets or  blankets that are too big for your bed. They should not hang down onto the floor. Have a firm chair that has side arms. You can use this for support while you get dressed. Do not have throw rugs and other things on the floor that can make you trip. What can I do in the kitchen? Clean up any spills right away. Avoid walking on wet floors. Keep items that you use a lot in easy-to-reach places. If you need to reach something above you, use a strong step stool that has a grab bar. Keep electrical cords out of the way. Do not use floor polish or wax that makes floors slippery. If you must use wax, use non-skid floor wax. Do not have throw rugs and other things on the floor that can make you trip. What can I do with my stairs? Do not leave any items on the stairs. Make sure that there are handrails on both sides of the stairs and use them. Fix handrails that are broken or loose. Make sure that handrails are as long as the stairways. Check any carpeting to make sure that it is firmly attached to the stairs. Fix any carpet that is loose or worn. Avoid having throw rugs at the top or bottom of the stairs. If you do have throw rugs, attach them to the floor with carpet tape. Make sure that  you have a light switch at the top of the stairs and the bottom of the stairs. If you do not have them, ask someone to add them for you. What else can I do to help prevent falls? Wear shoes that: Do not have high heels. Have rubber bottoms. Are comfortable and fit you well. Are closed at the toe. Do not wear sandals. If you use a stepladder: Make sure that it is fully opened. Do not climb a closed stepladder. Make sure that both sides of the stepladder are locked into place. Ask someone to hold it for you, if possible. Clearly mark and make sure that you can see: Any grab bars or handrails. First and last steps. Where the edge of each step is. Use tools that help you move around (mobility aids) if they are  needed. These include: Canes. Walkers. Scooters. Crutches. Turn on the lights when you go into a dark area. Replace any light bulbs as soon as they burn out. Set up your furniture so you have a clear path. Avoid moving your furniture around. If any of your floors are uneven, fix them. If there are any pets around you, be aware of where they are. Review your medicines with your doctor. Some medicines can make you feel dizzy. This can increase your chance of falling. Ask your doctor what other things that you can do to help prevent falls. This information is not intended to replace advice given to you by your health care provider. Make sure you discuss any questions you have with your health care provider. Document Released: 08/09/2009 Document Revised: 03/20/2016 Document Reviewed: 11/17/2014 Elsevier Interactive Patient Education  2017 [employer].

## 2022-04-17 NOTE — Telephone Encounter (Signed)
Gastroenterology Pre-Procedure Review  Request Date: 05/07/22 Requesting Physician: Dr. Vicente Males  PATIENT REVIEW QUESTIONS: The patient responded to the following health history questions as indicated:    1. Are you having any GI issues? no 2. Do you have a personal history of Polyps? yes (last colonoscoy with Dr.  Vira Agar 06/12/16 no polyps noted on this colonoscopy however the indication for that colonoscopy noted personal history of colon polyps) 3. Do you have a family history of Colon Cancer or Polyps? no 4. Diabetes Mellitus? no 5. Joint replacements in the past 12 months?no 6. Major health problems in the past 3 months?no 7. Any artificial heart valves, MVP, or defibrillator?no    MEDICATIONS & ALLERGIES:    Patient reports the following regarding taking any anticoagulation/antiplatelet therapy:   Plavix, Coumadin, Eliquis, Xarelto, Lovenox, Pradaxa, Brilinta, or Effient? no Aspirin? yes ('81mg'$  daily)  Patient confirms/reports the following medications:  Current Outpatient Medications  Medication Sig Dispense Refill   aspirin 81 MG EC tablet Take 81 mg by mouth daily. Swallow whole.     atorvastatin (LIPITOR) 20 MG tablet TAKE 1 TABLET BY MOUTH AT  BEDTIME 90 tablet 1   metoprolol succinate (TOPROL-XL) 50 MG 24 hr tablet TAKE 1 TABLET BY MOUTH ONCE  DAILY TAKE WITH OR IMMEDIATELY  FOLLOWING A MEAL 90 tablet 1   Multiple Vitamins-Minerals (ONE-A-DAY MENS VITACRAVES PO) Take 1 tablet by mouth daily.      triamcinolone (KENALOG) 0.025 % cream      valsartan-hydrochlorothiazide (DIOVAN-HCT) 160-25 MG tablet Take 1 tablet by mouth daily. 90 tablet 1   No current facility-administered medications for this visit.    Patient confirms/reports the following allergies:  No Known Allergies  No orders of the defined types were placed in this encounter.   AUTHORIZATION INFORMATION Primary Insurance: 1D#: Group #:  Secondary Insurance: 1D#: Group #:  SCHEDULE INFORMATION: Date:  05/07/22 Time: Location: [facility]

## 2022-05-06 ENCOUNTER — Encounter: Payer: Self-pay | Admitting: Gastroenterology

## 2022-05-07 ENCOUNTER — Ambulatory Visit
Admission: RE | Admit: 2022-05-07 | Discharge: 2022-05-07 | Disposition: A | Discharge status: Home / Self Care | Payer: Medicare Other | Attending: Gastroenterology | Admitting: Gastroenterology

## 2022-05-07 ENCOUNTER — Ambulatory Visit: Payer: Medicare Other | Admitting: Certified Registered Nurse Anesthetist

## 2022-05-07 ENCOUNTER — Encounter: Payer: Self-pay | Admitting: Gastroenterology

## 2022-05-07 ENCOUNTER — Encounter
Admission: RE | Disposition: A | Discharge status: Home / Self Care | Payer: Self-pay | Source: Home / Self Care | Attending: Gastroenterology

## 2022-05-07 DIAGNOSIS — Z86010 Personal history of colonic polyps: Secondary | ICD-10-CM

## 2022-05-07 DIAGNOSIS — D122 Benign neoplasm of ascending colon: Secondary | ICD-10-CM | POA: Insufficient documentation

## 2022-05-07 DIAGNOSIS — Z1211 Encounter for screening for malignant neoplasm of colon: Secondary | ICD-10-CM | POA: Diagnosis not present

## 2022-05-07 DIAGNOSIS — I10 Essential (primary) hypertension: Secondary | ICD-10-CM | POA: Diagnosis not present

## 2022-05-07 DIAGNOSIS — Z87891 Personal history of nicotine dependence: Secondary | ICD-10-CM | POA: Diagnosis not present

## 2022-05-07 DIAGNOSIS — K635 Polyp of colon: Secondary | ICD-10-CM | POA: Diagnosis not present

## 2022-05-07 HISTORY — PX: COLONOSCOPY WITH PROPOFOL: SHX5780

## 2022-05-07 HISTORY — DX: Atherosclerosis of aorta: I70.0

## 2022-05-07 SURGERY — COLONOSCOPY WITH PROPOFOL
Anesthesia: General

## 2022-05-07 MED ORDER — PROPOFOL 500 MG/50ML IV EMUL
INTRAVENOUS | Status: DC | PRN
  Administered 2022-05-07: 140 ug/kg/min via INTRAVENOUS

## 2022-05-07 MED ORDER — LIDOCAINE HCL (CARDIAC) PF 100 MG/5ML IV SOSY
PREFILLED_SYRINGE | INTRAVENOUS | Status: DC | PRN
  Administered 2022-05-07: 50 mg via INTRAVENOUS

## 2022-05-07 MED ORDER — EPHEDRINE SULFATE (PRESSORS) 50 MG/ML IJ SOLN
INTRAMUSCULAR | Status: DC | PRN
  Administered 2022-05-07 (×2): 5 mg via INTRAVENOUS

## 2022-05-07 MED ORDER — PROPOFOL 10 MG/ML IV BOLUS
INTRAVENOUS | Status: DC | PRN
  Administered 2022-05-07: 60 mg via INTRAVENOUS

## 2022-05-07 MED ORDER — SODIUM CHLORIDE 0.9 % IV SOLN
INTRAVENOUS | Status: DC
  Administered 2022-05-07: 1000 mL via INTRAVENOUS

## 2022-05-07 NOTE — Anesthesia Postprocedure Evaluation (Signed)
Anesthesia Post Note  Patient: Drew Mcmahon  Procedure(s) Performed: COLONOSCOPY WITH PROPOFOL  Patient location during evaluation: Endoscopy Anesthesia Type: General Level of consciousness: awake and alert Pain management: pain level controlled Vital Signs Assessment: post-procedure vital signs reviewed and stable Respiratory status: spontaneous breathing, nonlabored ventilation, respiratory function stable and patient connected to nasal cannula oxygen Cardiovascular status: blood pressure returned to baseline and stable Postop Assessment: no apparent nausea or vomiting Anesthetic complications: no   No notable events documented.   Last Vitals:  Vitals:   05/07/22 1113 05/07/22 1123  BP: (!) 96/57   Pulse:    Resp:    Temp: (!) 36.2 C   SpO2:  100%    Last Pain:  Vitals:   05/07/22 1123  TempSrc:   PainSc: 0-No pain                 Arita Miss

## 2022-05-07 NOTE — H&P (Signed)
Jonathon Bellows, MD [address] [place], [place], [place], [address] [address], [place], [place], [place], [address] Phone: [phone]  Fax: [phone]  Primary Care Physician:  Jon Billings, NP   Pre-Procedure History & Physical: HPI:  Drew Mcmahon is a 76 y.o. male is here for an colonoscopy.   Past Medical History:  Diagnosis Date   Aortic calcification (HCC)    Elevated lipids    Hip fracture ([place]) 2007   History of hip fracture 07/04/2018   Hyperlipidemia    Hypertension    Osteopenia 07/19/2018   Sept 2019; next scan Sept 2021   Postoperative nausea and vomiting 12/24/2018    Past Surgical History:  Procedure Laterality Date   APPENDECTOMY  04/03/2017   Incidental APPENDECTOMY;  Surgeon: [name], MD;  Location: [facility];  Service: General;;   BOWEL RESECTION  04/03/2017   Procedure: FOCAL SMALL BOWEL RESECTION;  Surgeon: [name], MD;  Location: [facility];  Service: General;;   COLON SURGERY     COLONOSCOPY WITH PROPOFOL N/A 06/12/2016   Procedure: COLONOSCOPY WITH PROPOFOL;  Surgeon: Manya Silvas, MD;  Location: [facility] [place] [facility];  Service: Endoscopy;  Laterality: N/A;   FRACTURE SURGERY Left 2007   hip   HOLEP-LASER ENUCLEATION OF THE PROSTATE WITH MORCELLATION N/A 06/14/2018   Procedure: HOLEP-LASER ENUCLEATION OF THE PROSTATE WITH MORCELLATION;  Surgeon: Hollice Espy, MD;  Location: [facility];  Service: Urology;  Laterality: N/A;   LAPAROSCOPY N/A 04/01/2017   Procedure: LAPAROSCOPY DIAGNOSTIC;  Surgeon: [name], MD;  Location: [facility];  Service: General;  Laterality: N/A;   LAPAROTOMY N/A 04/03/2017   Procedure: EXPLORATORY LAPAROTOMY;  Surgeon: [name], MD;  Location: [facility];  Service: General;  Laterality: N/A;   LYSIS OF ADHESION  04/03/2017   Procedure: LYSIS OF ADHESION;  Surgeon: [name], MD;  Location: [facility];  Service: General;;   VENTRAL HERNIA REPAIR N/A 12/13/2018    Procedure: HERNIA REPAIR VENTRAL ADULT WITH COMPONENT SEPARATOR MESH;  Surgeon: [name], MD;  Location: [facility];  Service: General;  Laterality: N/A;    Prior to Admission medications   Medication Sig Start Date End Date Taking? Authorizing Provider  aspirin 81 MG EC tablet Take 81 mg by mouth daily. Swallow whole.   Yes [provider]  atorvastatin (LIPITOR) 20 MG tablet TAKE 1 TABLET BY MOUTH AT  BEDTIME 03/06/22  Yes Jon Billings, NP  metoprolol succinate (TOPROL-XL) 50 MG 24 hr tablet TAKE 1 TABLET BY MOUTH ONCE  DAILY TAKE WITH OR IMMEDIATELY  FOLLOWING A MEAL 03/06/22  Yes Jon Billings, NP  Multiple Vitamins-Minerals (ONE-A-DAY MENS VITACRAVES PO) Take 1 tablet by mouth daily.    Yes [provider]  triamcinolone (KENALOG) 0.025 % cream  11/07/21  Yes [provider]  valsartan-hydrochlorothiazide (DIOVAN-HCT) 160-25 MG tablet Take 1 tablet by mouth daily. 02/13/22  Yes Jon Billings, NP    Allergies as of 04/17/2022   (No Known Allergies)    Family History  Problem Relation Age of Onset   Hypertension Mother    Alzheimer's disease Mother    Hypertension Father    Cancer Father        lung   Hypertension Sister    Hypertension Brother    Hypertension Sister    Hypertension Sister    Hypertension Brother    Prostate cancer Neg Hx    Kidney cancer Neg Hx  Social History   Socioeconomic History   Marital status: Married    Spouse name: Edd Fabian   Number of children: 3   Years of education: Not on file   Highest education level: 12th grade  Occupational History   Occupation: Retired  Tobacco Use   Smoking status: Former    Packs/day: 0.50    Years: 10.00    Total pack years: 5.00    Types: Cigarettes    Quit date: 08/26/1985    Years since quitting: 36.7   Smokeless tobacco: Never   Tobacco comments:    smoking cessation materials not required  Vaping Use   Vaping Use: Never used  Substance and Sexual  Activity   Alcohol use: No   Drug use: No   Sexual activity: Not Currently  Other Topics Concern   Not on file  Social History Narrative   Not on file   Social Determinants of Health   Financial Resource Strain: Low Risk  (04/17/2022)   Overall Financial Resource Strain (CARDIA)    Difficulty of Paying Living Expenses: Not hard at all  Food Insecurity: No Food Insecurity (04/17/2022)   Hunger Vital Sign    Worried About Running Out of Food in the Last Year: Never true    [place] in the Last Year: Never true  Transportation Needs: No Transportation Needs (04/17/2022)   PRAPARE - Hydrologist (Medical): No    Lack of Transportation (Non-Medical): No  Physical Activity: Sufficiently Active (04/17/2022)   Exercise Vital Sign    Days of Exercise per Week: 4 days    Minutes of Exercise per Session: 50 min  Stress: No Stress Concern Present (04/17/2022)   [place]    Feeling of Stress : Not at all  Social Connections: [place] (04/17/2022)   Social Connection and Isolation Panel [NHANES]    Frequency of Communication with Friends and Family: More than three times a week    Frequency of Social Gatherings with Friends and Family: Once a week    Attends Religious Services: More than 4 times per year    Active Member of [employer] or Organizations: Yes    Attends Music therapist: More than 4 times per year    Marital Status: Married  Human resources officer Violence: Not At Risk (04/17/2022)   Humiliation, Afraid, Rape, and Kick questionnaire    Fear of Current or Ex-Partner: No    Emotionally Abused: No    Physically Abused: No    Sexually Abused: No    Review of Systems: See HPI, otherwise negative ROS  Physical Exam: There were no vitals taken for this visit. General:   Alert,  pleasant and cooperative in NAD Head:  Normocephalic and atraumatic. Neck:  Supple; no  masses or thyromegaly. Lungs:  Clear throughout to auscultation, normal respiratory effort.    Heart:  +S1, +S2, Regular rate and rhythm, No edema. Abdomen:  Soft, nontender and nondistended. Normal bowel sounds, without guarding, and without rebound.   Neurologic:  Alert and  oriented x4;  grossly normal neurologically.  Impression/Plan: Drew Mcmahon is here for an colonoscopy to be performed for Screening colonoscopy average risk   Risks, benefits, limitations, and alternatives regarding  colonoscopy have been reviewed with the patient.  Questions have been answered.  All parties agreeable.   Jonathon Bellows, MD  05/07/2022, 10:32 AM

## 2022-05-07 NOTE — Anesthesia Procedure Notes (Signed)
Date/Time: 05/07/2022 10:59 AM  Performed by: Lily Peer, Azzure Garabedian, CRNAPre-anesthesia Checklist: Patient identified, Emergency Drugs available, Suction available, Patient being monitored and Timeout performed Patient Re-evaluated:Patient Re-evaluated prior to induction Oxygen Delivery Method: Nasal cannula Induction Type: IV induction

## 2022-05-07 NOTE — Transfer of Care (Signed)
Immediate Anesthesia Transfer of Care Note  Patient: Drew Mcmahon  Procedure(s) Performed: COLONOSCOPY WITH PROPOFOL  Patient Location: Endoscopy Unit  Anesthesia Type:General  Level of Consciousness: drowsy  Airway & Oxygen Therapy: Patient Spontanous Breathing  Post-op Assessment: Report given to RN and Post -op Vital signs reviewed and stable  Post vital signs: Reviewed and stable  Last Vitals:  Vitals Value Taken Time  BP 96/57 05/07/22 1114  Temp 36.2 C 05/07/22 1113  Pulse 64 05/07/22 1115  Resp 20 05/07/22 1115  SpO2 96 % 05/07/22 1115  Vitals shown include unvalidated device data.  Last Pain:  Vitals:   05/07/22 1113  TempSrc: Temporal  PainSc: 0-No pain         Complications: No notable events documented.

## 2022-05-07 NOTE — Op Note (Signed)
[facility] Gastroenterology Patient Name: Drew Mcmahon Procedure Date: 05/07/2022 10:32 AM MRN: [id number] Account #: [id number] Date of Birth: [birth year] Admit Type: Outpatient Age: 76 Room: [facility] ENDO ROOM 3 Gender: Male Note Status: Finalized Instrument Name: Jasper Riling [id number] Procedure:             Colonoscopy Indications:           Screening for colorectal malignant neoplasm Providers:             Jonathon Bellows MD, MD Referring MD:          Jon Billings (Referring MD) Medicines:             Monitored Anesthesia Care Complications:         No immediate complications. Procedure:             Pre-Anesthesia Assessment:                        - Prior to the procedure, a History and Physical was                         performed, and patient medications, allergies and                         sensitivities were reviewed. The patient's tolerance                         of previous anesthesia was reviewed.                        - The risks and benefits of the procedure and the                         sedation options and risks were discussed with the                         patient. All questions were answered and informed                         consent was obtained.                        - ASA Grade Assessment: II - A patient with mild                         systemic disease.                        After obtaining informed consent, the colonoscope was                         passed under direct vision. Throughout the procedure,                         the patient's blood pressure, pulse, and oxygen                         saturations were monitored continuously. The                         Colonoscope was introduced through  the anus and                         advanced to the the cecum, identified by the                         appendiceal orifice. The colonoscopy was performed                         with ease. The patient tolerated the procedure well.                          The quality of the bowel preparation was excellent. Findings:      The perianal and digital rectal examinations were normal.      A 5 mm polyp was found in the ascending colon. The polyp was sessile.       The polyp was removed with a cold snare. Resection and retrieval were       complete.      The exam was otherwise without abnormality on direct and retroflexion       views. Impression:            - One 5 mm polyp in the ascending colon, removed with                         a cold snare. Resected and retrieved.                        - The examination was otherwise normal on direct and                         retroflexion views. Recommendation:        - Discharge patient to home (with escort).                        - Resume previous diet.                        - Continue present medications.                        - Await pathology results.                        - Repeat colonoscopy for surveillance based on                         pathology results. Procedure Code(s):     --- Professional ---                        [phone], Colonoscopy, flexible; with removal of                         tumor(s), polyp(s), or other lesion(s) by snare                         technique Diagnosis Code(s):     --- Professional ---                        Z12.11,  Encounter for screening for malignant neoplasm                         of colon                        K63.5, Polyp of colon CPT copyright 2019 American Medical Association. All rights reserved. The codes documented in this report are preliminary and upon coder review may  be revised to meet current compliance requirements. Jonathon Bellows, MD Jonathon Bellows MD, MD 05/07/2022 11:12:01 AM This report has been signed electronically. Number of Addenda: 0 Note Initiated On: 05/07/2022 10:32 AM Scope Withdrawal Time: 0 hours 6 minutes 48 seconds  Total Procedure Duration: 0 hours 10 minutes 5 seconds  Estimated Blood Loss:  Estimated  blood loss: none.      [facility]

## 2022-05-07 NOTE — Anesthesia Preprocedure Evaluation (Signed)
Anesthesia Evaluation  Patient identified by MRN, date of birth, ID band Patient awake    Reviewed: Allergy & Precautions, NPO status , Patient's Chart, lab work & pertinent test results  History of Anesthesia Complications Negative for: history of anesthetic complications  Airway Mallampati: II  TM Distance: >3 FB Neck ROM: Full    Dental  (+) Missing   Pulmonary neg sleep apnea, neg COPD, former smoker,    breath sounds clear to auscultation- rhonchi (-) wheezing      Cardiovascular hypertension, Pt. on medications (-) CAD, (-) Past MI, (-) Cardiac Stents and (-) CABG  Rhythm:Regular Rate:Normal - Systolic murmurs and - Diastolic murmurs    Neuro/Psych neg Seizures negative neurological ROS  negative psych ROS   GI/Hepatic negative GI ROS, Neg liver ROS,   Endo/Other  negative endocrine ROSneg diabetes  Renal/GU negative Renal ROS     Musculoskeletal negative musculoskeletal ROS (+)   Abdominal (+) + obese,   Peds  Hematology negative hematology ROS (+)   Anesthesia Other Findings Past Medical History: No date: Elevated lipids 2007: Hip fracture ([place]) No date: Hyperlipidemia No date: Hypertension 07/19/2018: Osteopenia     Comment:  Sept 2019; next scan Sept 2021   Reproductive/Obstetrics negative OB ROS                             Anesthesia Physical  Anesthesia Plan  ASA: 2  Anesthesia Plan: General   Post-op Pain Management:  Regional for Post-op pain and Minimal or no pain anticipated   Induction: Intravenous  PONV Risk Score and Plan: 2 and Ondansetron, TIVA and Propofol infusion  Airway Management Planned: Natural Airway  Additional Equipment: None  Intra-op Plan:   Post-operative Plan:   Informed Consent: I have reviewed the patients History and Physical, chart, labs and discussed the procedure including the risks, benefits and alternatives for the proposed  anesthesia with the patient or authorized representative who has indicated his/her understanding and acceptance.     Dental advisory given  Plan Discussed with: CRNA and Anesthesiologist  Anesthesia Plan Comments: (Discussed risks of anesthesia with patient, including possibility of difficulty with spontaneous ventilation under anesthesia necessitating airway intervention, PONV, and rare risks such as cardiac or respiratory or neurological events, and allergic reactions. Discussed the role of CRNA in patient's perioperative care. Patient understands.)        Anesthesia Quick Evaluation

## 2022-05-08 LAB — SURGICAL PATHOLOGY

## 2022-05-09 ENCOUNTER — Encounter: Payer: Self-pay | Admitting: Gastroenterology

## 2022-06-10 ENCOUNTER — Telehealth: Payer: Self-pay

## 2022-06-10 NOTE — Patient Outreach (Signed)
  Care Coordination   06/10/2022 Name: Drew Mcmahon MRN: [id number] DOB: 1946/06/02   Care Coordination Outreach Attempts:  An unsuccessful telephone outreach was attempted today to offer the patient information about available care coordination services as a benefit of their health plan.   Follow Up Plan:  Additional outreach attempts will be made to offer the patient care coordination information and services.   Encounter Outcome:  No Answer  Care Coordination Interventions Activated:  No   Care Coordination Interventions:  No, not indicated    Noreene Larsson RN, MSN, CCM Community Care Coordinator [facility] Mobile: [phone]

## 2022-06-16 ENCOUNTER — Ambulatory Visit: Payer: Self-pay | Admitting: *Deleted

## 2022-06-16 NOTE — Patient Outreach (Addendum)
  Care Coordination   Initial Visit Note   06/16/2022 Name: Drew Mcmahon MRN: [id number] DOB: February 05, 1946  Drew Mcmahon is a 76 y.o. year old male who sees Drew Billings, NP for primary care. I spoke with  Drew Mcmahon by phone today  What matters to the patients health and wellness today?  Patient verbalized having no nursing or community resource needs at this time    Goals Addressed             This Visit's Progress    care coordination activities       Care Coordination Interventions: Care management program offered and discussed SDOH screening completed Patient denied having any nursing or community resource needs at this time Confirmed next PCP appointment is scheduled for 07/16/22 AWV completed on 04/17/22        SDOH assessments and interventions completed:  Yes  SDOH Interventions Today    Flowsheet Row Most Recent Value  SDOH Interventions   Food Insecurity Interventions Intervention Not Indicated  Housing Interventions Intervention Not Indicated  Transportation Interventions Intervention Not Indicated        Care Coordination Interventions Activated:  Yes  Care Coordination Interventions:  Yes, provided   Follow up plan: No further intervention required.   Encounter Outcome:  Pt. Visit Completed

## 2022-06-16 NOTE — Patient Instructions (Addendum)
Visit Information  Thank you for taking time to visit with me today. Please don't hesitate to contact me if I can be of assistance to you.   Following are the goals we discussed today:   Goals Addressed             This Visit's Progress    care coordination activities       Care Coordination Interventions: Care management program offered and discussed SDOH screening completed Patient denied having any nursing or community resource needs at this time Confirmed next PCP appointment is scheduled for 07/16/22 AWV completed on 04/17/22         If you are experiencing a Mental Health or [place] or need someone to talk to, please call the Suicide and Crisis Lifeline: [phone]   Patient verbalizes understanding of instructions and care plan provided today and agrees to view in [place]. Active MyChart status and patient understanding of how to access instructions and care plan via MyChart confirmed with patient.     No further follow up required: Patient to contact this Education officer, museum or his providers office with any additional needs  Elliot Gurney, [place] Worker  [facility] Care Management [phone]

## 2022-07-15 NOTE — Progress Notes (Unsigned)
There were no vitals taken for this visit.   Subjective:    Patient ID: Drew Mcmahon, male    DOB: 1946-09-01, 76 y.o.   MRN: [id number]  HPI: Drew Mcmahon is a 76 y.o. male  No chief complaint on file.  HYPERTENSION / HYPERLIPIDEMIA Satisfied with current treatment? yes Duration of hypertension: years BP monitoring frequency: daily BP range: 120-130/70- has been out of his medication and running 150s/80s BP medication side effects: no Past BP meds: valsartan-HCTZ and Metoprolol Duration of hyperlipidemia: years Cholesterol medication side effects: no Cholesterol supplements: none Past cholesterol medications: atorvastain (lipitor) Medication compliance: excellent compliance Aspirin: no Recent stressors: no Recurrent headaches: no Visual changes: no Palpitations: no Dyspnea: no Chest pain: no Lower extremity edema: no Dizzy/lightheaded: no   Relevant past medical, surgical, family and social history reviewed and updated as indicated. Interim medical history since our last visit reviewed. Allergies and medications reviewed and updated.  Review of Systems  Eyes:  Negative for visual disturbance.  Respiratory:  Negative for chest tightness and shortness of breath.   Cardiovascular:  Negative for chest pain, palpitations and leg swelling.  Neurological:  Negative for dizziness, light-headedness and headaches.   Per HPI unless specifically indicated above     Objective:    There were no vitals taken for this visit.  Wt Readings from Last 3 Encounters:  05/07/22 210 lb 6.9 oz (95.5 kg)  02/13/22 218 lb 12.8 oz (99.2 kg)  01/03/22 224 lb 6.4 oz (101.8 kg)    Physical Exam Vitals and nursing note reviewed.  Constitutional:      General: He is not in acute distress.    Appearance: Normal appearance. He is not ill-appearing, toxic-appearing or diaphoretic.  HENT:     Head: Normocephalic.     Right Ear: External ear normal.     Left Ear: External ear normal.      Nose: Nose normal. No congestion or rhinorrhea.     Mouth/Throat:     Mouth: Mucous membranes are moist.  Eyes:     General:        Right eye: No discharge.        Left eye: No discharge.     Extraocular Movements: Extraocular movements intact.     Conjunctiva/sclera: Conjunctivae normal.     Pupils: Pupils are equal, round, and reactive to light.  Cardiovascular:     Rate and Rhythm: Normal rate and regular rhythm.     Heart sounds: No murmur heard. Pulmonary:     Effort: Pulmonary effort is normal. No respiratory distress.     Breath sounds: Normal breath sounds. No wheezing, rhonchi or rales.  Abdominal:     General: Abdomen is flat. Bowel sounds are normal.  Musculoskeletal:     Cervical back: Normal range of motion and neck supple.  Skin:    General: Skin is warm and dry.     Capillary Refill: Capillary refill takes less than 2 seconds.  Neurological:     General: No focal deficit present.     Mental Status: He is alert and oriented to person, place, and time.  Psychiatric:        Mood and Affect: Mood normal.        Behavior: Behavior normal.        Thought Content: Thought content normal.        Judgment: Judgment normal.    Results for orders placed or performed during the hospital encounter of 05/07/22  Surgical  pathology  Result Value Ref Range   SURGICAL PATHOLOGY      SURGICAL PATHOLOGY CASE: [id number] PATIENT: Drew Mcmahon Surgical Pathology Report     Specimen Submitted: A. Colon polyp, asc; cold snare  Clinical History: Personal history of colon polyps. Colon polyp      DIAGNOSIS: A.  COLON, ASCENDING, POLYP; BIOPSY: - TUBULAR ADENOMA. - NO EVIDENCE OF HIGH-GRADE DYSPLASIA OR MALIGNANCY.  GROSS DESCRIPTION: A. Labeled: Cold snare ascending colon polyp Received: Formalin Collection time: 11:05 AM on 05/07/2022 Placed into formalin time: 11:05 AM on 05/07/2022 Tissue fragment(s): 1 Size: 0.3 x 0.2 x 0.1 cm Description: Tan soft tissue  fragment Entirely submitted in 1 cassette.  CM 05/07/2022  Final Diagnosis performed by Theodora Blow, MD.   Electronically signed 05/08/2022 9:51:10AM The electronic signature indicates that the named Attending Pathologist has evaluated the specimen Technical component performed at [facility], [address] [place], [place] [address] Lab: [phone] Dir: Rush Farmer, MD, MMM  Prof essional component performed at [facility], [facility], [place], [place], [place] [address] Lab: [phone] Dir: Kathi Simpers, MD       Assessment & Plan:   Problem List Items Addressed This Visit      Cardiovascular and Mediastinum   Hypertension - Primary (Chronic)   Aortic calcification (HCC)     Nervous and Auditory   Peripheral neuropathy     Other   Hyperlipidemia (Chronic)    Patient declined physical exam.   Follow up plan: No follow-ups on file.

## 2022-07-16 ENCOUNTER — Ambulatory Visit (INDEPENDENT_AMBULATORY_CARE_PROVIDER_SITE_OTHER): Payer: Medicare Other | Admitting: Nurse Practitioner

## 2022-07-16 ENCOUNTER — Encounter: Payer: Self-pay | Admitting: Nurse Practitioner

## 2022-07-16 VITALS — BP 110/70 | HR 49 | Temp 97.9°F | Wt 220.8 lb

## 2022-07-16 DIAGNOSIS — R7303 Prediabetes: Secondary | ICD-10-CM

## 2022-07-16 DIAGNOSIS — I700 Atherosclerosis of aorta: Secondary | ICD-10-CM

## 2022-07-16 DIAGNOSIS — I10 Essential (primary) hypertension: Secondary | ICD-10-CM

## 2022-07-16 DIAGNOSIS — Z23 Encounter for immunization: Secondary | ICD-10-CM

## 2022-07-16 DIAGNOSIS — E785 Hyperlipidemia, unspecified: Secondary | ICD-10-CM

## 2022-07-16 DIAGNOSIS — G603 Idiopathic progressive neuropathy: Secondary | ICD-10-CM

## 2022-07-16 NOTE — Assessment & Plan Note (Signed)
Pt tolerating current BP regimen. No changes to medications as BP is WNL.

## 2022-07-16 NOTE — Assessment & Plan Note (Signed)
Pt last A1C in March was 5.5. Pt still exercises/walks often and controls BG with diet changes and activity.  Will draw A1C today in office and make recommendations if needed.

## 2022-07-16 NOTE — Assessment & Plan Note (Addendum)
Pt BP elevated in office, consistent with in office visits, however BP log reveals BP WNL, 110-130s.  No concerns or issues with current BP regimen.  No changes made this visit. Pt remains active and walks daily. BMP drawn today to assess renal function.

## 2022-07-16 NOTE — Assessment & Plan Note (Signed)
Tolerating current medication regimen. Lipids drawn today.  Will communicate recommendations or changes following results of labs.

## 2022-07-17 LAB — LIPID PANEL
Chol/HDL Ratio: 2.5 ratio (ref 0.0–5.0)
Cholesterol, Total: 147 mg/dL (ref 100–199)
HDL: 58 mg/dL (ref >39)
LDL Chol Calc (NIH): 73 mg/dL (ref 0–99)
Triglycerides: 82 mg/dL (ref 0–149)
VLDL Cholesterol Cal: 16 mg/dL (ref 5–40)

## 2022-07-17 LAB — COMPREHENSIVE METABOLIC PANEL
ALT: 18 IU/L (ref 0–44)
AST: 22 IU/L (ref 0–40)
Albumin/Globulin Ratio: 1.8 (ref 1.2–2.2)
Albumin: 4.2 g/dL (ref 3.8–4.8)
Alkaline Phosphatase: 112 IU/L (ref 44–121)
BUN/Creatinine Ratio: 16 (ref 10–24)
BUN: 15 mg/dL (ref 8–27)
Bilirubin Total: 0.6 mg/dL (ref 0.0–1.2)
CO2: 26 mmol/L (ref 20–29)
Calcium: 9.3 mg/dL (ref 8.6–10.2)
Chloride: 98 mmol/L (ref 96–106)
Creatinine, Ser: 0.92 mg/dL (ref 0.76–1.27)
Globulin, Total: 2.4 g/dL (ref 1.5–4.5)
Glucose: 103 mg/dL — ABNORMAL HIGH (ref 70–99)
Potassium: 3.9 mmol/L (ref 3.5–5.2)
Sodium: 138 mmol/L (ref 134–144)
Total Protein: 6.6 g/dL (ref 6.0–8.5)
eGFR: 87 mL/min/{1.73_m2} (ref >59)

## 2022-07-17 LAB — HEMOGLOBIN A1C
Est. average glucose Bld gHb Est-mCnc: 126 mg/dL
Hgb A1c MFr Bld: 6 % — ABNORMAL HIGH (ref 4.8–5.6)

## 2022-07-17 NOTE — Progress Notes (Signed)
Hi Drew Mcmahon. It was nice to see you yesterday.  Your lab work looks good.  No concerns at this time. Continue with your current medication regimen.  Follow up as discussed.  Please let me know if you have any questions.

## 2022-07-25 ENCOUNTER — Other Ambulatory Visit: Payer: Self-pay | Admitting: Nurse Practitioner

## 2022-07-25 DIAGNOSIS — I10 Essential (primary) hypertension: Secondary | ICD-10-CM

## 2022-07-25 DIAGNOSIS — E785 Hyperlipidemia, unspecified: Secondary | ICD-10-CM

## 2022-07-25 NOTE — Telephone Encounter (Signed)
Requested Prescriptions  Pending Prescriptions Disp Refills  . valsartan-hydrochlorothiazide (DIOVAN-HCT) 160-25 MG tablet [Pharmacy Med Name: Valsartan-hydroCHLOROthiazide 160-25 MG Oral Tablet] 90 tablet 0    Sig: TAKE 1 TABLET BY MOUTH DAILY     Cardiovascular: ARB + Diuretic Combos Passed - 07/25/2022  5:14 AM      Passed - K in normal range and within 180 days    Potassium  Date Value Ref Range Status  07/16/2022 3.9 3.5 - 5.2 mmol/L Final         Passed - Na in normal range and within 180 days    Sodium  Date Value Ref Range Status  07/16/2022 138 134 - 144 mmol/L Final         Passed - Cr in normal range and within 180 days    Creat  Date Value Ref Range Status  07/29/2019 1.03 0.70 - 1.18 mg/dL Final    Comment:    For patients >65 years of age, the reference limit for Creatinine is approximately 13% higher for people identified as African-American. .    Creatinine, Ser  Date Value Ref Range Status  07/16/2022 0.92 0.76 - 1.27 mg/dL Final         Passed - eGFR is 10 or above and within 180 days    GFR, Est African American  Date Value Ref Range Status  07/29/2019 84 > OR = 60 mL/min/1.71m Final   GFR calc Af Amer  Date Value Ref Range Status  09/25/2020 98 >59 mL/min/1.73 Final    Comment:    **In accordance with recommendations from the NKF-ASN Task force,**   [facility] is in the process of updating its eGFR calculation to the   2021 CKD-EPI creatinine equation that estimates kidney function   without a race variable.    GFR, Est Non African American  Date Value Ref Range Status  07/29/2019 72 > OR = 60 mL/min/1.718mFinal   GFR calc non Af Amer  Date Value Ref Range Status  09/25/2020 85 >59 mL/min/1.73 Final   eGFR  Date Value Ref Range Status  07/16/2022 87 >59 mL/min/1.73 Final         Passed - Patient is not pregnant      Passed - Last BP in normal range    BP Readings from Last 1 Encounters:  07/16/22 110/70         Passed - Valid  encounter within last 6 months    Recent Outpatient Visits          1 week ago Primary hypertension   [facility]   5 months ago Primary hypertension   [facility]   6 months ago [facility]  [facility]   1 year ago Need for influenza vaccination   [facility]   2 years ago Essential hypertension   [facility]    Future Appointments            In 5 months [facility]         . atorvastatin (LIPITOR) 20 MG tablet [Pharmacy Med Name: Atorvastatin Calcium 20 MG Oral Tablet] 90 tablet 0    Sig: TAKE 1 TABLET BY MOUTH AT  BEDTIME     Cardiovascular:  Antilipid - Statins Failed - 07/25/2022  5:14 AM  Failed - Lipid Panel in normal range within the last 12 months    Cholesterol, Total  Date Value Ref Range Status  07/16/2022 147 100 - 199 mg/dL Final   Cholesterol Piccolo, Waived  Date Value Ref Range Status  03/04/2016 205 (H) <200 mg/dL Final    Comment:                            Desirable                <200                         Borderline High      200- 239                         High                     >239    LDL Cholesterol (Calc)  Date Value Ref Range Status  07/29/2019 72 mg/dL (calc) Final    Comment:    Reference range: <100 . Desirable range <100 mg/dL for primary prevention;   <70 mg/dL for patients with CHD or diabetic patients  with > or = 2 CHD risk factors. Marland Kitchen LDL-C is now calculated using the Martin-Hopkins  calculation, which is a validated novel method providing  better accuracy than the Friedewald equation in the  estimation of LDL-C.  Cresenciano Genre et al. Annamaria Helling. 6213;086(57): 2061-2068  (http://education.[employer].com/faq/FAQ164)    LDL Chol Calc (NIH)  Date Value Ref Range Status  07/16/2022  73 0 - 99 mg/dL Final   HDL  Date Value Ref Range Status  07/16/2022 58 >39 mg/dL Final   Triglycerides  Date Value Ref Range Status  07/16/2022 82 0 - 149 mg/dL Final   Triglycerides Piccolo,Waived  Date Value Ref Range Status  03/04/2016 89 <150 mg/dL Final    Comment:                            Normal                   <150                         Borderline High     150 - 199                         High                200 - 499                         Very High                >499          Passed - Patient is not pregnant      Passed - Valid encounter within last 12 months    Recent Outpatient Visits          1 week ago Primary hypertension   [place], Karen, NP   5 months ago Primary hypertension   [facility] Jon Billings, NP   6 months ago Aortic calcification [facility])   [facility] Jon Billings, NP  1 year ago Need for influenza vaccination   [facility] Myles Gip, DO   2 years ago Essential hypertension   [facility] Volney American, [place]      Future Appointments            In 5 months Jon Billings, NP [facility]           . metoprolol succinate (TOPROL-XL) 50 MG 24 hr tablet [Pharmacy Med Name: Metoprolol Succinate ER 50 MG Oral Tablet Extended Release 24 Hour] 90 tablet 0    Sig: TAKE 1 TABLET BY MOUTH ONCE  DAILY TAKE WITH OR IMMEDIATELY  FOLLOWING A MEAL     Cardiovascular:  Beta Blockers Passed - 07/25/2022  5:14 AM      Passed - Last BP in normal range    BP Readings from Last 1 Encounters:  07/16/22 110/70         Passed - Last Heart Rate in normal range    Pulse Readings from Last 1 Encounters:  07/16/22 (!) 28         Passed - Valid encounter within last 6 months    Recent Outpatient Visits          1 week ago Primary hypertension   [facility] Jon Billings, NP   5 months ago Primary  hypertension   [facility], NP   6 months ago Aortic calcification [facility])   [facility] Jon Billings, NP   1 year ago Need for influenza vaccination   [facility] Myles Gip, DO   2 years ago Essential hypertension   [facility] Volney American, [place]      Future Appointments            In 5 months Jon Billings, NP [facility] [place]

## 2022-09-10 NOTE — Progress Notes (Unsigned)
   There were no vitals taken for this visit.   Subjective:    Patient ID: Drew Mcmahon, male    DOB: 1945-12-19, 76 y.o.   MRN: [id number]  HPI: Drew Mcmahon is a 76 y.o. male  No chief complaint on file.   Relevant past medical, surgical, family and social history reviewed and updated as indicated. Interim medical history since our last visit reviewed. Allergies and medications reviewed and updated.  Review of Systems  Per HPI unless specifically indicated above     Objective:    There were no vitals taken for this visit.  Wt Readings from Last 3 Encounters:  07/16/22 220 lb 12.8 oz (100.2 kg)  05/07/22 210 lb 6.9 oz (95.5 kg)  02/13/22 218 lb 12.8 oz (99.2 kg)    Physical Exam  Results for orders placed or performed in visit on 07/16/22  Comp Met (CMET)  Result Value Ref Range   Glucose 103 (H) 70 - 99 mg/dL   BUN 15 8 - 27 mg/dL   Creatinine, Ser 0.92 0.76 - 1.27 mg/dL   eGFR 87 >59 mL/min/1.73   BUN/Creatinine Ratio 16 10 - 24   Sodium 138 134 - 144 mmol/L   Potassium 3.9 3.5 - 5.2 mmol/L   Chloride 98 96 - 106 mmol/L   CO2 26 20 - 29 mmol/L   Calcium 9.3 8.6 - 10.2 mg/dL   Total Protein 6.6 6.0 - 8.5 g/dL   Albumin 4.2 3.8 - 4.8 g/dL   Globulin, Total 2.4 1.5 - 4.5 g/dL   Albumin/Globulin Ratio 1.8 1.2 - 2.2   Bilirubin Total 0.6 0.0 - 1.2 mg/dL   Alkaline Phosphatase 112 44 - 121 IU/L   AST 22 0 - 40 IU/L   ALT 18 0 - 44 IU/L  HgB A1c  Result Value Ref Range   Hgb A1c MFr Bld 6.0 (H) 4.8 - 5.6 %   Est. average glucose Bld gHb Est-mCnc 126 mg/dL  Lipid Profile  Result Value Ref Range   Cholesterol, Total 147 100 - 199 mg/dL   Triglycerides 82 0 - 149 mg/dL   HDL 58 >39 mg/dL   VLDL Cholesterol Cal 16 5 - 40 mg/dL   LDL Chol Calc (NIH) 73 0 - 99 mg/dL   Chol/HDL Ratio 2.5 0.0 - 5.0 ratio      Assessment & Plan:   Problem List Items Addressed This Visit   None    Follow up plan: No follow-ups on file.

## 2022-09-11 ENCOUNTER — Ambulatory Visit
Admission: RE | Admit: 2022-09-11 | Discharge: 2022-09-11 | Disposition: A | Discharge status: Home / Self Care | Payer: Medicare Other | Source: Ambulatory Visit | Attending: Nurse Practitioner | Admitting: Nurse Practitioner

## 2022-09-11 ENCOUNTER — Ambulatory Visit (INDEPENDENT_AMBULATORY_CARE_PROVIDER_SITE_OTHER): Payer: Medicare Other | Admitting: Nurse Practitioner

## 2022-09-11 ENCOUNTER — Ambulatory Visit
Admission: RE | Admit: 2022-09-11 | Discharge: 2022-09-11 | Disposition: A | Discharge status: Home / Self Care | Payer: Medicare Other | Attending: Nurse Practitioner | Admitting: Nurse Practitioner

## 2022-09-11 ENCOUNTER — Encounter: Payer: Self-pay | Admitting: Nurse Practitioner

## 2022-09-11 VITALS — BP 138/70 | HR 48 | Temp 97.7°F | Wt 221.4 lb

## 2022-09-11 DIAGNOSIS — M79671 Pain in right foot: Secondary | ICD-10-CM

## 2022-09-11 DIAGNOSIS — M7989 Other specified soft tissue disorders: Secondary | ICD-10-CM | POA: Diagnosis not present

## 2022-09-15 ENCOUNTER — Telehealth: Payer: Self-pay | Admitting: Nurse Practitioner

## 2022-09-15 NOTE — Addendum Note (Signed)
Addended by: Jon Billings on: 09/15/2022 12:41 PM   Modules accepted: Orders

## 2022-09-15 NOTE — Progress Notes (Signed)
Please let patient know that he has a good amount of arthritis in his right foot and a heel spur.  These are likely the cause of his foot pain.  I recommend he see Orthopedics.  If he needs a referral, I am happy to place it.

## 2022-09-15 NOTE — Telephone Encounter (Signed)
Copied from [place] [id number]. Topic: General - Other >> Sep 15, 2022  9:25 AM Ludger Nutting wrote: Patient's wife wants to speak with PCP about referral for foot pain. Please follow up  with patients wife.

## 2022-09-15 NOTE — Telephone Encounter (Signed)
This has been taken care of.

## 2022-09-15 NOTE — Progress Notes (Signed)
Referral placed.

## 2022-09-29 ENCOUNTER — Telehealth (INDEPENDENT_AMBULATORY_CARE_PROVIDER_SITE_OTHER): Payer: Medicare Other | Admitting: Nurse Practitioner

## 2022-09-29 ENCOUNTER — Ambulatory Visit: Payer: Self-pay | Admitting: *Deleted

## 2022-09-29 ENCOUNTER — Encounter: Payer: Self-pay | Admitting: Nurse Practitioner

## 2022-09-29 DIAGNOSIS — U071 COVID-19: Secondary | ICD-10-CM

## 2022-09-29 MED ORDER — MOLNUPIRAVIR EUA 200MG CAPSULE: 4.0000 | ORAL_CAPSULE | Freq: Two times a day (BID) | ORAL | 0 refills | Status: AC

## 2022-09-29 NOTE — Progress Notes (Signed)
There were no vitals taken for this visit.   Subjective:    Patient ID: Drew Mcmahon, male    DOB: 08/11/1946, 76 y.o.   MRN: [id number]  HPI: Drew Mcmahon is a 76 y.o. male  Chief Complaint  Patient presents with   Covid Positive    Pt states he started feeling bad on Thursday evening, states he has been having congestion, cough, sore throat and a fever. States he took a home covid test yesterday and it was positive.    UPPER RESPIRATORY TRACT INFECTION Worst symptom: COVID Positive yesterday and symptoms started late Thursday. Fever: yes Cough: yes Shortness of breath: no Wheezing: yes Chest pain: no Chest tightness: yes Chest congestion: yes Nasal congestion: yes Runny nose: no Post nasal drip: yes Sneezing: yes Sore throat: yes Swollen glands: no Sinus pressure: no Headache: no Face pain: no Toothache: no Ear pain: no bilateral Ear pressure: no bilateral Eyes red/itching:no Eye drainage/crusting: no  Vomiting: no Rash: no Fatigue: yes Sick contacts: no Strep contacts: no  Context: better Recurrent sinusitis: no Relief with OTC cold/cough medications: no  Treatments attempted: none   Relevant past medical, surgical, family and social history reviewed and updated as indicated. Interim medical history since our last visit reviewed. Allergies and medications reviewed and updated.  Review of Systems  Constitutional:  Positive for fatigue and fever.  HENT:  Positive for congestion, postnasal drip, sneezing and sore throat. Negative for ear pain, rhinorrhea, sinus pressure and sinus pain.   Respiratory:  Positive for cough, shortness of breath and wheezing. Negative for chest tightness.   Gastrointestinal:  Negative for vomiting.  Skin:  Negative for rash.  Neurological:  Negative for headaches.    Per HPI unless specifically indicated above     Objective:    There were no vitals taken for this visit.  Wt Readings from Last 3 Encounters:  09/11/22  221 lb 6.4 oz (100.4 kg)  07/16/22 220 lb 12.8 oz (100.2 kg)  05/07/22 210 lb 6.9 oz (95.5 kg)    Physical Exam Vitals and nursing note reviewed.  Constitutional:      General: He is not in acute distress.    Appearance: He is not ill-appearing.  HENT:     Head: Normocephalic.     Right Ear: Hearing normal.     Left Ear: Hearing normal.     Nose: Nose normal.  Pulmonary:     Effort: Pulmonary effort is normal. No respiratory distress.  Neurological:     Mental Status: He is alert.  Psychiatric:        Mood and Affect: Mood normal.        Behavior: Behavior normal.        Thought Content: Thought content normal.        Judgment: Judgment normal.     Results for orders placed or performed in visit on 07/16/22  Comp Met (CMET)  Result Value Ref Range   Glucose 103 (H) 70 - 99 mg/dL   BUN 15 8 - 27 mg/dL   Creatinine, Ser 0.92 0.76 - 1.27 mg/dL   eGFR 87 >59 mL/min/1.73   BUN/Creatinine Ratio 16 10 - 24   Sodium 138 134 - 144 mmol/L   Potassium 3.9 3.5 - 5.2 mmol/L   Chloride 98 96 - 106 mmol/L   CO2 26 20 - 29 mmol/L   Calcium 9.3 8.6 - 10.2 mg/dL   Total Protein 6.6 6.0 - 8.5 g/dL   Albumin 4.2  3.8 - 4.8 g/dL   Globulin, Total 2.4 1.5 - 4.5 g/dL   Albumin/Globulin Ratio 1.8 1.2 - 2.2   Bilirubin Total 0.6 0.0 - 1.2 mg/dL   Alkaline Phosphatase 112 44 - 121 IU/L   AST 22 0 - 40 IU/L   ALT 18 0 - 44 IU/L  HgB A1c  Result Value Ref Range   Hgb A1c MFr Bld 6.0 (H) 4.8 - 5.6 %   Est. average glucose Bld gHb Est-mCnc 126 mg/dL  Lipid Profile  Result Value Ref Range   Cholesterol, Total 147 100 - 199 mg/dL   Triglycerides 82 0 - 149 mg/dL   HDL 58 >39 mg/dL   VLDL Cholesterol Cal 16 5 - 40 mg/dL   LDL Chol Calc (NIH) 73 0 - 99 mg/dL   Chol/HDL Ratio 2.5 0.0 - 5.0 ratio      Assessment & Plan:   Problem List Items Addressed This Visit   None Visit Diagnoses     COVID-19    -  Primary   Complete course of Molnupiravir. Can use motrin/tylenol as needed.  Stay  well hydrated. If symptoms worsen can do chest xray. Follow up if not improved.   Relevant Medications   molnupiravir EUA (LAGEVRIO) 200 mg CAPS capsule        Follow up plan: Return if symptoms worsen or fail to improve.  This visit was completed via MyChart due to the restrictions of the COVID-19 pandemic. All issues as above were discussed and addressed. Physical exam was done as above through visual confirmation on MyChart. If it was felt that the patient should be evaluated in the office, they were directed there. The patient verbally consented to this visit. Location of the patient: Home Location of the provider: Office Those involved with this call:  Provider: Jon Billings, NP CMA: Yvonna Alanis, CMA Front Desk/Registration: Lynnell Catalan This encounter was conducted via video.  I spent 20 dedicated to the care of this patient on the date of this encounter to include previsit review of symptoms, plan of care, medications, face to face time with the patient, and post visit ordering of testing.

## 2022-09-29 NOTE — Telephone Encounter (Signed)
  Chief Complaint: + COVID Symptoms: cough, chest congestion, fever Frequency: symptoms started Thursday pm Pertinent Negatives: Patient denies SOB Disposition: '[]'$ ED /'[]'$ Urgent Care (no appt availability in office) / '[x]'$ Appointment(In office/virtual)/ '[]'$  [place] [facility]/ '[]'$ Home Care/ '[]'$ Refused Recommended Disposition /'[]'$ [place] [facility]/ '[]'$  Follow-up with PCP Additional Notes: Patient advised per COVID protocol- treatment/isolation, virtual appointment scheduled

## 2022-09-29 NOTE — Telephone Encounter (Signed)
Summary: covid   Pt started feeling bad Thursday and tested for covid yesterday / pts wife asked if something can be called in for him / main symptoms were congestion, mucus, headache, sore throat and fever       Reason for Disposition  [1] HIGH RISK patient (e.g., weak immune system, age > 3 years, obesity with BMI 30 or higher, pregnant, chronic lung disease or other chronic medical condition) AND [2] COVID symptoms (e.g., cough, fever)  (Exceptions: Already seen by PCP and no new or worsening symptoms.)  Answer Assessment - Initial Assessment Questions 1. COVID-19 DIAGNOSIS: "How do you know that you have COVID?" (e.g., positive lab test or self-test, diagnosed by doctor or NP/PA, symptoms after exposure).     Tested at pharmacy 2. COVID-19 EXPOSURE: "Was there any known exposure to COVID before the symptoms began?" CDC Definition of close contact: within 6 feet (2 meters) for a total of 15 minutes or more over a 24-hour period.      unknown 3. ONSET: "When did the COVID-19 symptoms start?"      Thursday/ Friday am 4. WORST SYMPTOM: "What is your worst symptom?" (e.g., cough, fever, shortness of breath, muscle aches)     Congestion, sore throat, chest sore 5. COUGH: "Do you have a cough?" If Yes, ask: "How bad is the cough?"       Yes- bad- sputum- yellow, blood present at times 6. FEVER: "Do you have a fever?" If Yes, ask: "What is your temperature, how was it measured, and when did it start?"     Low grade fever for 2 days 7. RESPIRATORY STATUS: "Describe your breathing?" (e.g., normal; shortness of breath, wheezing, unable to speak)      Chest os "sore" from coughing 8. BETTER-SAME-WORSE: "Are you getting better, staying the same or getting worse compared to yesterday?"  If getting worse, ask, "In what way?"     Better today 9. OTHER SYMPTOMS: "Do you have any other symptoms?"  (e.g., chills, fatigue, headache, loss of smell or taste, muscle pain, sore throat)     Chills, fatigue,  lack of appetite  10. HIGH RISK DISEASE: "Do you have any chronic medical problems?" (e.g., asthma, heart or lung disease, weak immune system, obesity, etc.)       Age 76. VACCINE: "Have you had the COVID-19 vaccine?" If Yes, ask: "Which one, how many shots, when did you get it?"       no  Protocols used: Coronavirus (COVID-19) Diagnosed or Suspected-A-AH

## 2022-10-06 ENCOUNTER — Other Ambulatory Visit: Payer: Self-pay | Admitting: Nurse Practitioner

## 2022-10-06 DIAGNOSIS — E785 Hyperlipidemia, unspecified: Secondary | ICD-10-CM

## 2022-10-06 DIAGNOSIS — I10 Essential (primary) hypertension: Secondary | ICD-10-CM

## 2022-10-15 DIAGNOSIS — M13871 Other specified arthritis, right ankle and foot: Secondary | ICD-10-CM | POA: Diagnosis not present

## 2022-12-01 ENCOUNTER — Emergency Department: Payer: Medicare Other

## 2022-12-01 ENCOUNTER — Ambulatory Visit: Payer: Self-pay | Admitting: *Deleted

## 2022-12-01 ENCOUNTER — Emergency Department
Admission: EM | Admit: 2022-12-01 | Discharge: 2022-12-01 | Disposition: A | Discharge status: Home / Self Care | Payer: Medicare Other | Attending: Emergency Medicine | Admitting: Emergency Medicine

## 2022-12-01 DIAGNOSIS — R1030 Lower abdominal pain, unspecified: Secondary | ICD-10-CM | POA: Diagnosis not present

## 2022-12-01 DIAGNOSIS — R109 Unspecified abdominal pain: Secondary | ICD-10-CM | POA: Diagnosis present

## 2022-12-01 DIAGNOSIS — K4090 Unilateral inguinal hernia, without obstruction or gangrene, not specified as recurrent: Secondary | ICD-10-CM | POA: Diagnosis not present

## 2022-12-01 DIAGNOSIS — Z20822 Contact with and (suspected) exposure to covid-19: Secondary | ICD-10-CM | POA: Insufficient documentation

## 2022-12-01 DIAGNOSIS — R1011 Right upper quadrant pain: Secondary | ICD-10-CM | POA: Diagnosis not present

## 2022-12-01 DIAGNOSIS — E860 Dehydration: Secondary | ICD-10-CM | POA: Insufficient documentation

## 2022-12-01 DIAGNOSIS — K5730 Diverticulosis of large intestine without perforation or abscess without bleeding: Secondary | ICD-10-CM | POA: Diagnosis not present

## 2022-12-01 DIAGNOSIS — R1084 Generalized abdominal pain: Secondary | ICD-10-CM | POA: Diagnosis not present

## 2022-12-01 DIAGNOSIS — N3000 Acute cystitis without hematuria: Secondary | ICD-10-CM | POA: Diagnosis not present

## 2022-12-01 DIAGNOSIS — R112 Nausea with vomiting, unspecified: Secondary | ICD-10-CM | POA: Insufficient documentation

## 2022-12-01 DIAGNOSIS — K8020 Calculus of gallbladder without cholecystitis without obstruction: Secondary | ICD-10-CM | POA: Diagnosis not present

## 2022-12-01 LAB — CBC
HCT: 47.1 % (ref 39.0–52.0)
Hemoglobin: 15.8 g/dL (ref 13.0–17.0)
MCH: 30.5 pg (ref 26.0–34.0)
MCHC: 33.5 g/dL (ref 30.0–36.0)
MCV: 90.9 fL (ref 80.0–100.0)
Platelets: 205 10*3/uL (ref 150–400)
RBC: 5.18 MIL/uL (ref 4.22–5.81)
RDW: 12.9 % (ref 11.5–15.5)
WBC: 10.5 10*3/uL (ref 4.0–10.5)
nRBC: 0 % (ref 0.0–0.2)

## 2022-12-01 LAB — COMPREHENSIVE METABOLIC PANEL
ALT: 19 U/L (ref 0–44)
AST: 23 U/L (ref 15–41)
Albumin: 3.7 g/dL (ref 3.5–5.0)
Alkaline Phosphatase: 89 U/L (ref 38–126)
Anion gap: 9 (ref 5–15)
BUN: 24 mg/dL — ABNORMAL HIGH (ref 8–23)
CO2: 24 mmol/L (ref 22–32)
Calcium: 8.9 mg/dL (ref 8.9–10.3)
Chloride: 102 mmol/L (ref 98–111)
Creatinine, Ser: 0.86 mg/dL (ref 0.61–1.24)
GFR, Estimated: >60 mL/min (ref >60)
Glucose, Bld: 143 mg/dL — ABNORMAL HIGH (ref 70–99)
Potassium: 4.1 mmol/L (ref 3.5–5.1)
Sodium: 135 mmol/L (ref 135–145)
Total Bilirubin: 1.2 mg/dL (ref 0.3–1.2)
Total Protein: 7.1 g/dL (ref 6.5–8.1)

## 2022-12-01 LAB — URINALYSIS, ROUTINE W REFLEX MICROSCOPIC
Bacteria, UA: NONE SEEN
Bilirubin Urine: NEGATIVE
Glucose, UA: NEGATIVE mg/dL
Hgb urine dipstick: NEGATIVE
Ketones, ur: 20 mg/dL — AB
Leukocytes,Ua: NEGATIVE
Nitrite: NEGATIVE
Protein, ur: 30 mg/dL — AB
Specific Gravity, Urine: 1.029 (ref 1.005–1.030)
pH: 5 (ref 5.0–8.0)

## 2022-12-01 LAB — LIPASE, BLOOD: Lipase: 31 U/L (ref 11–51)

## 2022-12-01 LAB — RESP PANEL BY RT-PCR (RSV, FLU A&B, COVID)  RVPGX2
Influenza A by PCR: NEGATIVE
Influenza B by PCR: NEGATIVE
Resp Syncytial Virus by PCR: NEGATIVE
SARS Coronavirus 2 by RT PCR: NEGATIVE

## 2022-12-01 LAB — LACTIC ACID, PLASMA: Lactic Acid, Venous: 1.9 mmol/L (ref 0.5–1.9)

## 2022-12-01 LAB — TROPONIN I (HIGH SENSITIVITY): Troponin I (High Sensitivity): 8 ng/L (ref <18)

## 2022-12-01 MED ORDER — IOHEXOL 300 MG/ML  SOLN
100.0000 mL | Freq: Once | INTRAMUSCULAR | Status: AC | PRN
  Administered 2022-12-01: 100 mL via INTRAVENOUS

## 2022-12-01 MED ORDER — ONDANSETRON HCL 4 MG/2ML IJ SOLN
4.0000 mg | Freq: Once | INTRAMUSCULAR | Status: AC
  Administered 2022-12-01: 4 mg via INTRAVENOUS
  Filled 2022-12-01: qty 2

## 2022-12-01 MED ORDER — SODIUM CHLORIDE 0.9 % IV SOLN
2.0000 g | Freq: Once | INTRAVENOUS | Status: AC
  Administered 2022-12-01: 2 g via INTRAVENOUS
  Filled 2022-12-01: qty 20

## 2022-12-01 MED ORDER — MORPHINE SULFATE (PF) 4 MG/ML IV SOLN
4.0000 mg | Freq: Once | INTRAVENOUS | Status: AC
  Administered 2022-12-01: 4 mg via INTRAVENOUS
  Filled 2022-12-01: qty 1

## 2022-12-01 MED ORDER — FAMOTIDINE IN NACL 20-0.9 MG/50ML-% IV SOLN
20.0000 mg | Freq: Once | INTRAVENOUS | Status: AC
  Administered 2022-12-01: 20 mg via INTRAVENOUS
  Filled 2022-12-01: qty 50

## 2022-12-01 MED ORDER — ONDANSETRON 4 MG PO TBDP: 4.0000 mg | ORAL_TABLET | Freq: Three times a day (TID) | ORAL | 0 refills | Status: DC | PRN

## 2022-12-01 MED ORDER — SODIUM CHLORIDE 0.9 % IV BOLUS
1000.0000 mL | Freq: Once | INTRAVENOUS | Status: AC
  Administered 2022-12-01: 1000 mL via INTRAVENOUS

## 2022-12-01 MED ORDER — DICYCLOMINE HCL 20 MG PO TABS: 20.0000 mg | ORAL_TABLET | Freq: Three times a day (TID) | ORAL | 0 refills | Status: DC | PRN

## 2022-12-01 MED ORDER — CEFDINIR 300 MG PO CAPS: 300.0000 mg | ORAL_CAPSULE | Freq: Two times a day (BID) | ORAL | 0 refills | Status: AC

## 2022-12-01 MED ORDER — LACTATED RINGERS IV BOLUS
1000.0000 mL | Freq: Once | INTRAVENOUS | Status: AC
  Administered 2022-12-01: 1000 mL via INTRAVENOUS

## 2022-12-01 NOTE — Telephone Encounter (Addendum)
  Chief Complaint: vomiting and dry heaving for a week.  Not urinating as much as usual, has a history of surgery in 2018 where they removed 6 inches of his colon.   He had these same symptoms then and ended up in the hospital having surgery.    However he does not want to go to the ED.   Wife called in and she's going to try to get him to go. Symptoms: above Frequency: Since approximately last Tuesday Pertinent Negatives: Patient denies vomiting today but still having dry heaves.   He is sore from the extreme nausea and dry heaving.   Disposition: '[x]'$ ED /'[]'$ Urgent Care (no appt availability in office) / '[]'$ Appointment(In office/virtual)/ '[]'$  [place] [facility]/ '[]'$ Home Care/ '[]'$ Refused Recommended Disposition /'[]'$ [place] [facility]/ '[]'$  Follow-up with PCP Additional Notes: Wife is going to try and talk him into going to the ED probably the one in [facility] because the wait is usually not as long there.   He had a bad experience in the local ED and he is refusing to go back.      There are no video appts available at [facility] today or tomorrow.   I checked at wife's request.   The office is closed today due to a maintenance emergency.   Everyone is working remotely.

## 2022-12-01 NOTE — ED Provider Notes (Signed)
[facility] Provider Note    Event Date/Time   First MD Initiated Contact with Patient 12/01/22 1631     (approximate)   History   Abdominal Pain   HPI  Drew Mcmahon is a 77 y.o. male  here with abdominal pain. Pt reports that over the past 3-4 days he has had progressively worsening aching, throbbing abdominal pain. He has had nausea, vomiting and has been unable to eat/drink anything, or keep any meds down. He has had no BM. He has had abd distension and increasing lower abd fullness. He subsequently presents for evaluation. He has a h/o bowel obstruction requiring surgery in the past.       Physical Exam   Triage Vital Signs: ED Triage Vitals [12/01/22 1357]  Enc Vitals Group     BP (!) 171/79     Pulse Rate 91     Resp 16     Temp 98.3 F (36.8 C)     Temp Source Oral     SpO2 100 %     Weight 220 lb (99.8 kg)     Height 6' (1.829 m)     Head Circumference      Peak Flow      Pain Score 6     Pain Loc      Pain Edu?      Excl. in [place]?     Most recent vital signs: Vitals:   12/01/22 1754 12/01/22 1834  BP: (!) 157/65   Pulse: 64   Resp: 15   Temp:  98.8 F (37.1 C)  SpO2: 100%      General: Awake, no distress.  CV:  Good peripheral perfusion. RRR. No murmurs. Resp:  Normal effort. Lungs CTAB. Abd:  Moderate lower abd TTP, hypoactive bowel sounds. No rebound or guarding. Other:  No LE edema.   ED Results / Procedures / Treatments   Labs (all labs ordered are listed, but only abnormal results are displayed) Labs Reviewed  COMPREHENSIVE METABOLIC PANEL - Abnormal; Notable for the following components:      Result Value   Glucose, Bld 143 (*)    BUN 24 (*)    All other components within normal limits  URINALYSIS, ROUTINE W REFLEX MICROSCOPIC - Abnormal; Notable for the following components:   Color, Urine YELLOW (*)    APPearance HAZY (*)    Ketones, ur 20 (*)    Protein, ur 30 (*)    All other components within  normal limits  RESP PANEL BY RT-PCR (RSV, FLU A&B, COVID)  [id number]  URINE CULTURE  LIPASE, BLOOD  CBC  LACTIC ACID, PLASMA  LACTIC ACID, PLASMA  TROPONIN I (HIGH SENSITIVITY)     EKG Normal sinus rhythm, VR 75. PR 182, QRS 86, QTc433. No acute St elevations or depressions. No ischemia or infarct.   RADIOLOGY CT A/P: No acute abnormality, cholelithaisis noted [place]: GB sludge, no signs of acute cholecystitis   I also independently reviewed and agree with radiologist interpretations.   PROCEDURES:  Critical Care performed: No  .1-3 Lead EKG Interpretation  Performed by: Duffy Bruce, MD Authorized by: Duffy Bruce, MD     Interpretation: normal     ECG rate:  80-90   ECG rate assessment: normal     Rhythm: sinus rhythm     Ectopy: none     Conduction: normal   Comments:     Indication: Abdominal pain, vomiting    MEDICATIONS ORDERED IN ED: Medications  sodium chloride 0.9 % bolus 1,000 mL (0 mLs Intravenous Stopped 12/01/22 1835)  ondansetron (ZOFRAN) injection 4 mg (4 mg Intravenous Given 12/01/22 1715)  morphine (PF) 4 MG/ML injection 4 mg (4 mg Intravenous Given 12/01/22 1715)  iohexol (OMNIPAQUE) 300 MG/ML solution 100 mL (100 mLs Intravenous Contrast Given 12/01/22 1723)  lactated ringers bolus 1,000 mL (1,000 mLs Intravenous New Bag/Given 12/01/22 1835)  famotidine (PEPCID) IVPB 20 mg premix (0 mg Intravenous Stopped 12/01/22 1835)  cefTRIAXone (ROCEPHIN) 2 g in sodium chloride 0.9 % 100 mL IVPB (2 g Intravenous New Bag/Given 12/01/22 1916)  ondansetron (ZOFRAN) injection 4 mg (4 mg Intravenous Given 12/01/22 1915)     IMPRESSION / MDM / ASSESSMENT AND PLAN / ED COURSE  I reviewed the triage vital signs and the nursing notes.                              Differential diagnosis includes, but is not limited to, SBO, colitis, diverticulitis, enteritis, UTI, perforation, mesenteric ischemia, renal colic, prostatitis  Patient's presentation is most consistent with acute  presentation with potential threat to life or bodily function.  The patient is on the cardiac monitor to evaluate for evidence of arrhythmia and/or significant heart rate changes.  77 yo M with PMHx as above here with abdominal pain, nausea, fatigue. Pt is non-toxic and well appearing. Labs overall very reassuring. CBC without leukocytosis or anemia. CMP with mild BUN/Cr ratio likely from dehydration. UA with ketones c/w dehydration, + pyuria. H/o UTIs. No flank pain or signs of pyelo. CT a/p obtained, reviewed, and shows no obstruction or other abnormalities. US shows sludge but no signs of chole. Pt has normal LFTs. No ongoing RUQ TTP. Pt given antiemetics and feels better, is tolerating pO. Query viral GI syndrome versus UTI. Will place on ABX, zofran, and d/c home. Return precautions given.    FINAL CLINICAL IMPRESSION(S) / ED DIAGNOSES   Final diagnoses:  Generalized abdominal pain  Dehydration  Acute cystitis without hematuria     Rx / DC Orders   ED Discharge Orders          Ordered    ondansetron (ZOFRAN-ODT) 4 MG disintegrating tablet  Every 8 hours PRN        12/01/22 1946    cefdinir (OMNICEF) 300 MG capsule  2 times daily        12/01/22 1946    dicyclomine (BENTYL) 20 MG tablet  3 times daily PRN        12/01/22 1946             Note:  This document was prepared using Dragon voice recognition software and may include unintentional dictation errors.   Duffy Bruce, MD 12/01/22 [id number]

## 2022-12-01 NOTE — Discharge Instructions (Signed)
If your symptoms persist more than 48 hours follow-up with your doctor or return to the ER  Your gallbladder did have sludge on ultrasound, but no evidence of infection. If you continue to get abdominal pain, nausea/vomiting, and specifically right upper quadrant pain, notify your doctor and follow-up with a general surgeon

## 2022-12-01 NOTE — Telephone Encounter (Signed)
Reason for Disposition  [1] MODERATE vomiting (e.g., 3 - 5 times/day) AND [2] age > 60 years  Answer Assessment - Initial Assessment Questions 1. VOMITING SEVERITY: "How many times have you vomited in the past 24 hours?"     - MILD:  1 - 2 times/day    - MODERATE: 3 - 5 times/day, decreased oral intake without significant weight loss or symptoms of dehydration    - SEVERE: 6 or more times/day, vomits everything or nearly everything, with significant weight loss, symptoms of dehydration      1st couple of days he vomited now having dry heaves.   He is not eating.   Edd Fabian, wife calling in.    2018 he had surgery on his stomach.    It had infection so 6 inches of his colon was removed.    2. ONSET: "When did the vomiting begin?"      Last week       3. FLUIDS: "What fluids or food have you vomited up today?" "Have you been able to keep any fluids down?"     Now he is have a gag reflex.     He took a Pepto Bismol this morning.   Ate apple sauce this morning so far he has not vomited.    No dizziness. 4. ABDOMEN PAIN: "Are your having any abdomen pain?" If Yes : "How bad is it and what does it feel like?" (e.g., crampy, dull, intermittent, constant)      No pain      Gagging a lot.   Sore from that. 5. DIARRHEA: "Is there any diarrhea?" If Yes, ask: "How many times today?"      No 6. CONTACTS: "Is there anyone else in the family with the same symptoms?"      Feels like it did when he had surgery 2018 7. CAUSE: "What do you think is causing your vomiting?"     I don't know 8. HYDRATION STATUS: "Any signs of dehydration?" (e.g., dry mouth [not only dry lips], too weak to stand) "When did you last urinate?"     No dizziness or fainting.     Not urinating as much as usual 9. OTHER SYMPTOMS: "Do you have any other symptoms?" (e.g., fever, headache, vertigo, vomiting blood or coffee grounds, recent head injury)     No heaeache 10. PREGNANCY: "Is there any chance you are pregnant?" "When was your  last menstrual period?"       N/A  Protocols used: Vomiting-A-AH

## 2022-12-01 NOTE — ED Notes (Signed)
Pt Dc to home. Dc instructions reviewed with all questions answered. Pt voices understanding. IV removed, cath intact, pressure dressing applied. No bleeding noted at site. Pt ambulatory out of dept with steady gait with family member.

## 2022-12-01 NOTE — ED Triage Notes (Signed)
Pt c/o nausea and lack of appetite x 5 days. Pt states he has been unable to take any meds over the weak. Pt c/o lower abd pain. Pt states he has not had a BM in several days.  Pt states he has had dry heaves and "clamminess' Denies SHOB, cough.

## 2022-12-02 LAB — URINE CULTURE: Culture: NO GROWTH

## 2022-12-08 ENCOUNTER — Telehealth: Payer: Self-pay

## 2022-12-08 NOTE — Telephone Encounter (Signed)
     Patient  visit on 2/5  at [facility] Have you been able to follow up with your primary care physician? Yes   The patient was or was not able to obtain any needed medicine or equipment. Yes   Are there diet recommendations that you are having difficulty following? Na    Patient expresses understanding of discharge instructions and education provided has no other needs at this time.  Yes      Organ [phone] [address], [place], [place] [address] Phone: [phone] Email: [email or web address]

## 2022-12-08 NOTE — Progress Notes (Unsigned)
There were no vitals taken for this visit.   Subjective:    Patient ID: Drew Mcmahon, male    DOB: [birth year], 77 y.o.   MRN: [id number]  HPI: Drew Mcmahon is a 77 y.o. male  No chief complaint on file.   Relevant past medical, surgical, family and social history reviewed and updated as indicated. Interim medical history since our last visit reviewed. Allergies and medications reviewed and updated.  Review of Systems  Per HPI unless specifically indicated above     Objective:    There were no vitals taken for this visit.  Wt Readings from Last 3 Encounters:  12/01/22 220 lb (99.8 kg)  09/11/22 221 lb 6.4 oz (100.4 kg)  07/16/22 220 lb 12.8 oz (100.2 kg)    Physical Exam  Results for orders placed or performed during the hospital encounter of 12/01/22  Resp panel by RT-PCR (RSV, Flu A&B, Covid) Anterior Nasal Swab   Specimen: Anterior Nasal Swab  Result Value Ref Range   SARS Coronavirus 2 by RT PCR NEGATIVE NEGATIVE   Influenza A by PCR NEGATIVE NEGATIVE   Influenza B by PCR NEGATIVE NEGATIVE   Resp Syncytial Virus by PCR NEGATIVE NEGATIVE  Urine Culture (for pregnant, neutropenic or urologic patients or patients with an indwelling urinary catheter)   Specimen: Urine, Clean Catch  Result Value Ref Range   Specimen Description      URINE, CLEAN CATCH Performed at [facility], [address] [place], [place] [address]    Special Requests      NONE Performed at [facility], [address], [place], [place] [address]    Culture      NO GROWTH Performed at [facility], [place] [address] [place], [place] [address]    Report Status 12/02/2022 FINAL   Lipase, blood  Result Value Ref Range   Lipase 31 11 - 51 U/L  Comprehensive metabolic panel  Result Value Ref Range   Sodium 135 135 - 145 mmol/L   Potassium 4.1 3.5 - 5.1 mmol/L   Chloride 102 98 - 111 mmol/L   CO2 24 22 - 32 mmol/L   Glucose, Bld 143 (H) 70 - 99 mg/dL   BUN 24  (H) 8 - 23 mg/dL   Creatinine, Ser 0.86 0.61 - 1.24 mg/dL   Calcium 8.9 8.9 - 10.3 mg/dL   Total Protein 7.1 6.5 - 8.1 g/dL   Albumin 3.7 3.5 - 5.0 g/dL   AST 23 15 - 41 U/L   ALT 19 0 - 44 U/L   Alkaline Phosphatase 89 38 - 126 U/L   Total Bilirubin 1.2 0.3 - 1.2 mg/dL   GFR, Estimated >60 >60 mL/min   Anion gap 9 5 - 15  CBC  Result Value Ref Range   WBC 10.5 4.0 - 10.5 K/uL   RBC 5.18 4.22 - 5.81 MIL/uL   Hemoglobin 15.8 13.0 - 17.0 g/dL   HCT 47.1 39.0 - 52.0 %   MCV 90.9 80.0 - 100.0 fL   MCH 30.5 26.0 - 34.0 pg   MCHC 33.5 30.0 - 36.0 g/dL   RDW 12.9 11.5 - 15.5 %   Platelets 205 150 - 400 K/uL   nRBC 0.0 0.0 - 0.2 %  Urinalysis, Routine w reflex microscopic -Urine, Clean Catch  Result Value Ref Range   Color, Urine YELLOW (A) YELLOW   APPearance HAZY (A) CLEAR   Specific Gravity, Urine 1.029 1.005 - 1.030  pH 5.0 5.0 - 8.0   Glucose, UA NEGATIVE NEGATIVE mg/dL   Hgb urine dipstick NEGATIVE NEGATIVE   Bilirubin Urine NEGATIVE NEGATIVE   Ketones, ur 20 (A) NEGATIVE mg/dL   Protein, ur 30 (A) NEGATIVE mg/dL   Nitrite NEGATIVE NEGATIVE   Leukocytes,Ua NEGATIVE NEGATIVE   RBC / HPF 0-5 0 - 5 RBC/hpf   WBC, UA 6-10 0 - 5 WBC/hpf   Bacteria, UA NONE SEEN NONE SEEN   Squamous Epithelial / HPF 0-5 0 - 5 /HPF   Mucus PRESENT   Lactic acid, plasma  Result Value Ref Range   Lactic Acid, Venous 1.9 0.5 - 1.9 mmol/L  Troponin I (High Sensitivity)  Result Value Ref Range   Troponin I (High Sensitivity) 8 <18 ng/L      Assessment & Plan:   Problem List Items Addressed This Visit   None    Follow up plan: No follow-ups on file.

## 2022-12-09 ENCOUNTER — Encounter: Payer: Self-pay | Admitting: Nurse Practitioner

## 2022-12-09 ENCOUNTER — Ambulatory Visit (INDEPENDENT_AMBULATORY_CARE_PROVIDER_SITE_OTHER): Payer: Medicare Other | Admitting: Nurse Practitioner

## 2022-12-09 VITALS — BP 145/69 | HR 53 | Temp 98.2°F | Wt 216.4 lb

## 2022-12-09 DIAGNOSIS — I10 Essential (primary) hypertension: Secondary | ICD-10-CM

## 2022-12-09 DIAGNOSIS — K8020 Calculus of gallbladder without cholecystitis without obstruction: Secondary | ICD-10-CM | POA: Diagnosis not present

## 2022-12-09 DIAGNOSIS — R7303 Prediabetes: Secondary | ICD-10-CM | POA: Diagnosis not present

## 2022-12-09 DIAGNOSIS — I700 Atherosclerosis of aorta: Secondary | ICD-10-CM

## 2022-12-09 DIAGNOSIS — R109 Unspecified abdominal pain: Secondary | ICD-10-CM

## 2022-12-09 NOTE — Assessment & Plan Note (Signed)
Ongoing since 11/24/22.  Imaging and labs reviewed from the ER.  Will repeat labs at visit today.  Suspect gallbladder issue with sludge and possible stones seen on [place].  If labs are unremarkable will refer to general surgery for further evaluation.

## 2022-12-10 LAB — CBC WITH DIFFERENTIAL/PLATELET
Basophils Absolute: 0.1 10*3/uL (ref 0.0–0.2)
Basos: 1 %
EOS (ABSOLUTE): 0.5 10*3/uL — ABNORMAL HIGH (ref 0.0–0.4)
Eos: 6 %
Hematocrit: 43.9 % (ref 37.5–51.0)
Hemoglobin: 15 g/dL (ref 13.0–17.7)
Immature Grans (Abs): 0 10*3/uL (ref 0.0–0.1)
Immature Granulocytes: 0 %
Lymphocytes Absolute: 2 10*3/uL (ref 0.7–3.1)
Lymphs: 26 %
MCH: 30.8 pg (ref 26.6–33.0)
MCHC: 34.2 g/dL (ref 31.5–35.7)
MCV: 90 fL (ref 79–97)
Monocytes Absolute: 0.8 10*3/uL (ref 0.1–0.9)
Monocytes: 10 %
Neutrophils Absolute: 4.4 10*3/uL (ref 1.4–7.0)
Neutrophils: 57 %
Platelets: 228 10*3/uL (ref 150–450)
RBC: 4.87 x10E6/uL (ref 4.14–5.80)
RDW: 12.5 % (ref 11.6–15.4)
WBC: 7.7 10*3/uL (ref 3.4–10.8)

## 2022-12-10 LAB — COMPREHENSIVE METABOLIC PANEL
ALT: 17 IU/L (ref 0–44)
AST: 18 IU/L (ref 0–40)
Albumin/Globulin Ratio: 1.5 (ref 1.2–2.2)
Albumin: 3.8 g/dL (ref 3.8–4.8)
Alkaline Phosphatase: 112 IU/L (ref 44–121)
BUN/Creatinine Ratio: 15 (ref 10–24)
BUN: 16 mg/dL (ref 8–27)
Bilirubin Total: 0.5 mg/dL (ref 0.0–1.2)
CO2: 25 mmol/L (ref 20–29)
Calcium: 9.1 mg/dL (ref 8.6–10.2)
Chloride: 101 mmol/L (ref 96–106)
Creatinine, Ser: 1.05 mg/dL (ref 0.76–1.27)
Globulin, Total: 2.6 g/dL (ref 1.5–4.5)
Glucose: 117 mg/dL — ABNORMAL HIGH (ref 70–99)
Potassium: 4.1 mmol/L (ref 3.5–5.2)
Sodium: 139 mmol/L (ref 134–144)
Total Protein: 6.4 g/dL (ref 6.0–8.5)
eGFR: 74 mL/min/{1.73_m2} (ref >59)

## 2022-12-10 LAB — URINALYSIS, ROUTINE W REFLEX MICROSCOPIC
Bilirubin, UA: NEGATIVE
Glucose, UA: NEGATIVE
Ketones, UA: NEGATIVE
Leukocytes,UA: NEGATIVE
Nitrite, UA: NEGATIVE
Protein,UA: NEGATIVE
RBC, UA: NEGATIVE
Specific Gravity, UA: 1.02 (ref 1.005–1.030)
Urobilinogen, Ur: 0.2 mg/dL (ref 0.2–1.0)
pH, UA: 6.5 (ref 5.0–7.5)

## 2022-12-10 LAB — HEMOGLOBIN A1C
Est. average glucose Bld gHb Est-mCnc: 123 mg/dL
Hgb A1c MFr Bld: 5.9 % — ABNORMAL HIGH (ref 4.8–5.6)

## 2022-12-10 NOTE — Progress Notes (Unsigned)
Patient ID: Drew Mcmahon, male   DOB: [birth year], 77 y.o.   MRN: [id number]  Chief Complaint: Right upper quadrant pain  History of Present Illness Drew Mcmahon is a 77 y.o. male with a multi month history of postprandial nausea, right sided pain which radiates to the back and right upper quadrant.  Progressive over time, recent CT scan and ultrasound obtained for ED visit for presumed dehydration has revealed cholelithiasis and gallbladder sludge.  Patient received some IV fluids in the ER, was elected to refer for general surgery later and presents today.  He reports having a small beef late last night to celebrate Valentine's, and approximately 6 or more hours later had an episode of gagging without actual vomiting.  This is presumed to be related to gallstones.  He has had a prior history of small bowel obstruction which required a small bowel resection in 2018, this was followed by ventral hernia repair with a 10 inch x 12 inch retrorectus mesh in 2020.  Past Medical History Past Medical History:  Diagnosis Date   Aortic calcification (HCC)    Elevated lipids    Hip fracture ([place]) 2007   History of hip fracture 07/04/2018   Hyperlipidemia    Hypertension    Osteopenia 07/19/2018   Sept 2019; next scan Sept 2021   Postoperative nausea and vomiting 12/24/2018      Past Surgical History:  Procedure Laterality Date   APPENDECTOMY  04/03/2017   Incidental APPENDECTOMY;  Surgeon: [name], MD;  Location: [facility];  Service: General;;   BOWEL RESECTION  04/03/2017   Procedure: FOCAL SMALL BOWEL RESECTION;  Surgeon: [name], MD;  Location: [facility];  Service: General;;   COLON SURGERY     COLONOSCOPY WITH PROPOFOL N/A 06/12/2016   Procedure: COLONOSCOPY WITH PROPOFOL;  Surgeon: Manya Silvas, MD;  Location: [place];  Service: Endoscopy;  Laterality: N/A;   COLONOSCOPY WITH PROPOFOL N/A 05/07/2022   Procedure: COLONOSCOPY WITH PROPOFOL;  Surgeon: Jonathon Bellows, MD;  Location: [facility];  Service: Gastroenterology;  Laterality: N/A;   FRACTURE SURGERY Left 2007   hip   HOLEP-LASER ENUCLEATION OF THE PROSTATE WITH MORCELLATION N/A 06/14/2018   Procedure: HOLEP-LASER ENUCLEATION OF THE PROSTATE WITH MORCELLATION;  Surgeon: Hollice Espy, MD;  Location: [facility];  Service: Urology;  Laterality: N/A;   LAPAROSCOPY N/A 04/01/2017   Procedure: LAPAROSCOPY DIAGNOSTIC;  Surgeon: [name], MD;  Location: [facility];  Service: General;  Laterality: N/A;   LAPAROTOMY N/A 04/03/2017   Procedure: EXPLORATORY LAPAROTOMY;  Surgeon: [name], MD;  Location: [facility];  Service: General;  Laterality: N/A;   LYSIS OF ADHESION  04/03/2017   Procedure: LYSIS OF ADHESION;  Surgeon: [name], MD;  Location: [facility];  Service: General;;   VENTRAL HERNIA REPAIR N/A 12/13/2018   Procedure: HERNIA REPAIR VENTRAL ADULT WITH COMPONENT SEPARATOR MESH;  Surgeon: [name], MD;  Location: [facility];  Service: General;  Laterality: N/A;    No Known Allergies  Current Outpatient Medications  Medication Sig Dispense Refill   aspirin 81 MG EC tablet Take 81 mg by mouth daily. Swallow whole.     atorvastatin (LIPITOR) 20 MG tablet TAKE 1 TABLET BY MOUTH AT  BEDTIME 90 tablet 1   Docusate Calcium (STOOL SOFTENER PO) Take 1 capsule by mouth in the morning and at bedtime.     metoprolol succinate (TOPROL-XL) 50 MG 24 hr tablet TAKE 1 TABLET BY MOUTH ONCE  DAILY TAKE WITH OR IMMEDIATELY  FOLLOWING A MEAL 90 tablet 1   Multiple Vitamins-Minerals (ONE-A-DAY MENS VITACRAVES PO) Take 1 tablet by mouth daily.      triamcinolone (KENALOG) 0.025 % cream      valsartan-hydrochlorothiazide (DIOVAN-HCT) 160-25 MG tablet TAKE 1 TABLET BY MOUTH DAILY 90 tablet 1   No current facility-administered medications for this visit.    Family History Family History  Problem Relation Age of Onset   Hypertension Mother    Alzheimer's disease Mother     Hypertension Father    Cancer Father        lung   Hypertension Sister    Hypertension Brother    Hypertension Sister    Hypertension Sister    Hypertension Brother    Prostate cancer Neg Hx    Kidney cancer Neg Hx       Social History Social History   Tobacco Use   Smoking status: Former    Packs/day: 0.50    Years: 10.00    Total pack years: 5.00    Types: Cigarettes    Quit date: 08/26/1985    Years since quitting: 37.3   Smokeless tobacco: Never   Tobacco comments:    smoking cessation materials not required  Vaping Use   Vaping Use: Never used  Substance Use Topics   Alcohol use: No   Drug use: No        Review of Systems  Constitutional: Negative.   HENT: Negative.    Eyes: Negative.   Respiratory: Negative.    Cardiovascular: Negative.   Gastrointestinal:  Positive for abdominal pain, constipation, nausea and vomiting.  Genitourinary:  Positive for frequency.  Skin: Negative.   Neurological: Negative.   Psychiatric/Behavioral: Negative.       Physical Exam Blood pressure (!) 167/76, pulse (!) 49, temperature 97.8 F (36.6 C), temperature source Oral, height 6' (1.829 m), weight 212 lb 12.8 oz (96.5 kg), SpO2 100 %. Last Weight  Most recent update: 12/11/2022  9:59 AM    Weight  96.5 kg (212 lb 12.8 oz)             CONSTITUTIONAL: Well developed, and nourished, appropriately responsive and aware without distress.   EYES: Sclera non-icteric.   EARS, NOSE, MOUTH AND THROAT:  The oropharynx is clear. Oral mucosa is pink and moist.   Hearing is intact to voice.  NECK: Trachea is midline, and there is no jugular venous distension.  LYMPH NODES:  Lymph nodes in the neck are not appreciated. RESPIRATORY:  Lungs are clear, and breath sounds are equal bilaterally.  Normal respiratory effort without pathologic use of accessory muscles. CARDIOVASCULAR: Heart is regular in rate and rhythm.   Well perfused.  GI: The abdomen is soft, nontender, and  nondistended. There were no palpable masses.  Midline scar is noted, no appreciable fascial defect.  I did not appreciate hepatosplenomegaly.  MUSCULOSKELETAL:  Symmetrical muscle tone appreciated in all four extremities.    SKIN: Skin turgor is normal. No pathologic skin lesions appreciated.  NEUROLOGIC:  Motor and sensation appear grossly normal.  Cranial nerves are grossly without defect. PSYCH:  Alert and oriented to person, place and time. Affect is appropriate for situation.  Data Reviewed I have personally reviewed what is currently available of the patient's imaging, recent labs and medical records.   Labs:     Latest Ref Rng & Units 12/09/2022    9:11 AM 12/01/2022    2:02 PM 12/23/2018   12:10 PM  CBC  WBC 3.4 - 10.8 x10E3/uL 7.7  10.5  11.0   Hemoglobin 13.0 - 17.7 g/dL 15.0  15.8  14.8   Hematocrit 37.5 - 51.0 % 43.9  47.1  44.6   Platelets 150 - 450 x10E3/uL 228  205  281       Latest Ref Rng & Units 12/09/2022    9:11 AM 12/01/2022    2:02 PM 07/16/2022   10:32 AM  CMP  Glucose 70 - 99 mg/dL 117  143  103   BUN 8 - 27 mg/dL 16  24  15   $ Creatinine 0.76 - 1.27 mg/dL 1.05  0.86  0.92   Sodium 134 - 144 mmol/L 139  135  138   Potassium 3.5 - 5.2 mmol/L 4.1  4.1  3.9   Chloride 96 - 106 mmol/L 101  102  98   CO2 20 - 29 mmol/L 25  24  26   $ Calcium 8.6 - 10.2 mg/dL 9.1  8.9  9.3   Total Protein 6.0 - 8.5 g/dL 6.4  7.1  6.6   Total Bilirubin 0.0 - 1.2 mg/dL 0.5  1.2  0.6   Alkaline Phos 44 - 121 IU/L 112  89  112   AST 0 - 40 IU/L 18  23  22   $ ALT 0 - 44 IU/L 17  19  18       $ Imaging: Radiological images reviewed:  CLINICAL DATA:  Right upper quadrant pain.   EXAM: ULTRASOUND ABDOMEN LIMITED RIGHT UPPER QUADRANT   COMPARISON:  CT earlier today.   FINDINGS: Gallbladder:   Physiologically distended. Intraluminal layering sludge. There may be tiny echogenic stones. No gallbladder wall thickening. No sonographic Murphy sign noted by sonographer.   Common bile  duct:   Diameter: 2 mm.   Liver:   No focal lesion identified. Portions the left lobe are not well visualized by ultrasound. Within normal limits in parenchymal echogenicity. Portal vein is patent on color Doppler imaging with normal direction of blood flow towards the liver.   Other: No right upper quadrant ascites.   IMPRESSION: 1. Gallbladder sludge and possible small stones. No sonographic findings of acute cholecystitis. 2. No biliary dilatation.     Electronically Signed   By: Keith Rake M.D.   On: 12/01/2022 18:59  CLINICAL DATA:  Five day history of nausea and lack of appetite with lower abdominal pain   EXAM: CT ABDOMEN AND PELVIS WITH CONTRAST   TECHNIQUE: Multidetector CT imaging of the abdomen and pelvis was performed using the standard protocol following bolus administration of intravenous contrast.   RADIATION DOSE REDUCTION: This exam was performed according to the departmental dose-optimization program which includes automated exposure control, adjustment of the mA and/or kV according to patient size and/or use of iterative reconstruction technique.   CONTRAST:  13m OMNIPAQUE IOHEXOL 300 MG/ML  SOLN   COMPARISON:  CT abdomen and pelvis dated 03/30/2017   FINDINGS: Lower chest: No focal consolidation or pulmonary nodule in the lung bases. No pleural effusion or pneumothorax demonstrated. Partially imaged heart size is normal.   Hepatobiliary: No focal hepatic lesions. No intra or extrahepatic biliary ductal dilation. Cholelithiasis.   Pancreas: No focal lesions or main ductal dilation.   Spleen: Normal in size without focal abnormality.   Adrenals/Urinary Tract: No adrenal nodules. No suspicious renal mass, calculi or hydronephrosis. Subcentimeter left renal hypodensities, too small to characterize but unchanged from 03/30/2017, likely cysts. Left parapelvic. No focal bladder wall thickening.  Stomach/Bowel: Small hiatal hernia.  Duodenal diverticulum in the second portion. Postsurgical changes of right lower quadrant small bowel. Anastomosis is intact. No evidence of bowel wall thickening, distention, or acute inflammatory changes. Submucosal fat attenuation within small bowel loops of the right lower quadrant, likely sequela of prior infection/inflammation. Colonic diverticulosis without acute diverticulitis. Status post appendectomy.   Vascular/Lymphatic: Aortic atherosclerosis. No enlarged abdominal or pelvic lymph nodes.   Reproductive: Prostate is unremarkable.   Other: No free fluid, fluid collection, or free air.   Musculoskeletal: Postsurgical changes from proximal left femoral fixation. Hardware appears intact. Unchanged grade 1 retrolisthesis at L4-5. Small fat-containing right inguinal hernia. Postsurgical changes of the anterior abdominal wall.   IMPRESSION: 1. No acute abnormality in the abdomen or pelvis. 2. Colonic diverticulosis without acute diverticulitis. 3. Cholelithiasis without evidence of acute cholecystitis. 4. Small fat-containing right inguinal hernia. 5. Aortic Atherosclerosis (ICD10-I70.0).     Electronically Signed   By: Darrin Nipper M.D.   On: 12/01/2022 17:48 Within last 24 hrs: No results found.  Assessment    Chronic calculus cholecystitis with biliary colic  Patient Active Problem List   Diagnosis Date Noted   Abdominal pain 12/09/2022   Prediabetes 12/02/2018   Osteopenia 07/19/2018   Obesity (BMI 30.0-34.9) 05/31/2018   Diverticulosis of colon 05/31/2018   Aortic calcification ([place]) 05/31/2018   History of ventral hernia repair 05/27/2018   Spinal stenosis at L4-L5 level 12/01/2016   BPH (benign prostatic hyperplasia) 08/27/2015   Peripheral neuropathy 08/27/2015   Hyperlipidemia    Hypertension     Plan    Robotic cholecystectomy with ICG major ductal imaging.  This was discussed thoroughly.  Optimal plan is for robotic cholecystectomy utilizing ICG  imaging. Risks and benefits have been discussed with the patient which include but are not limited to anesthesia, bleeding, infection, biliary ductal injury, resulting in leak or stenosis, other associated unanticipated injuries affiliated with laparoscopic surgery.   Noting he has a wide mesh, we will avoid laparoscopic incisions, in order to complete her cholecystectomy hopefully without violating the mesh or requiring an open operation. Reviewed that removing the gallbladder will only address the symptoms related to the gallbladder itself.  I believe there is the desire to proceed, accepting the risks with understanding.  Questions elicited and answered to satisfaction.    No guarantees ever expressed or implied.   Face-to-face time spent with the patient and accompanying care providers(if present) was 40 minutes, with more than 50% of the time spent counseling, educating, and coordinating care of the patient.    These notes generated with voice recognition software. I apologize for typographical errors.  Ronny Bacon M.D., FACS 12/11/2022, 10:31 AM

## 2022-12-10 NOTE — Progress Notes (Signed)
HI Drew Mcmahon. Your lab work looks great.  A1c is well controlled at 5.9.  Your liver, kidneys and electrolytes look great.  Your urine is normal and there is no evidence of an infection.  I have placed the referral to general surgery.  Please let me know if you have any questions.

## 2022-12-10 NOTE — Addendum Note (Signed)
Addended by: Jon Billings on: 12/10/2022 09:45 AM   Modules accepted: Orders

## 2022-12-10 NOTE — H&P (View-Only) (Signed)
Patient ID: Drew Mcmahon, male   DOB: 09-Jul-1946, 77 y.o.   MRN: [id number]  Chief Complaint: Right upper quadrant pain  History of Present Illness Drew Mcmahon is a 77 y.o. male with a multi month history of postprandial nausea, right sided pain which radiates to the back and right upper quadrant.  Progressive over time, recent CT scan and ultrasound obtained for ED visit for presumed dehydration has revealed cholelithiasis and gallbladder sludge.  Patient received some IV fluids in the ER, was elected to refer for general surgery later and presents today.  He reports having a small beef late last night to celebrate Valentine's, and approximately 6 or more hours later had an episode of gagging without actual vomiting.  This is presumed to be related to gallstones.  He has had a prior history of small bowel obstruction which required a small bowel resection in 2018, this was followed by ventral hernia repair with a 10 inch x 12 inch retrorectus mesh in 2020.  Past Medical History Past Medical History:  Diagnosis Date   Aortic calcification (HCC)    Elevated lipids    Hip fracture ([place]) 2007   History of hip fracture 07/04/2018   Hyperlipidemia    Hypertension    Osteopenia 07/19/2018   Sept 2019; next scan Sept 2021   Postoperative nausea and vomiting 12/24/2018      Past Surgical History:  Procedure Laterality Date   APPENDECTOMY  04/03/2017   Incidental APPENDECTOMY;  Surgeon: [name], MD;  Location: [facility];  Service: General;;   BOWEL RESECTION  04/03/2017   Procedure: FOCAL SMALL BOWEL RESECTION;  Surgeon: [name], MD;  Location: [facility];  Service: General;;   COLON SURGERY     COLONOSCOPY WITH PROPOFOL N/A 06/12/2016   Procedure: COLONOSCOPY WITH PROPOFOL;  Surgeon: Manya Silvas, MD;  Location: [facility];  Service: Endoscopy;  Laterality: N/A;   COLONOSCOPY WITH PROPOFOL N/A 05/07/2022   Procedure: COLONOSCOPY WITH PROPOFOL;  Surgeon: Jonathon Bellows, MD;  Location: [facility]COPY;  Service: Gastroenterology;  Laterality: N/A;   FRACTURE SURGERY Left 2007   hip   HOLEP-LASER ENUCLEATION OF THE PROSTATE WITH MORCELLATION N/A 06/14/2018   Procedure: HOLEP-LASER ENUCLEATION OF THE PROSTATE WITH MORCELLATION;  Surgeon: Hollice Espy, MD;  Location: [facility];  Service: Urology;  Laterality: N/A;   LAPAROSCOPY N/A 04/01/2017   Procedure: LAPAROSCOPY DIAGNOSTIC;  Surgeon: [name], MD;  Location: [facility];  Service: General;  Laterality: N/A;   LAPAROTOMY N/A 04/03/2017   Procedure: EXPLORATORY LAPAROTOMY;  Surgeon: [name], MD;  Location: [facility];  Service: General;  Laterality: N/A;   LYSIS OF ADHESION  04/03/2017   Procedure: LYSIS OF ADHESION;  Surgeon: [name], MD;  Location: [facility];  Service: General;;   VENTRAL HERNIA REPAIR N/A 12/13/2018   Procedure: HERNIA REPAIR VENTRAL ADULT WITH COMPONENT SEPARATOR MESH;  Surgeon: [name], MD;  Location: [facility];  Service: General;  Laterality: N/A;    No Known Allergies  Current Outpatient Medications  Medication Sig Dispense Refill   aspirin 81 MG EC tablet Take 81 mg by mouth daily. Swallow whole.     atorvastatin (LIPITOR) 20 MG tablet TAKE 1 TABLET BY MOUTH AT  BEDTIME 90 tablet 1   Docusate Calcium (STOOL SOFTENER PO) Take 1 capsule by mouth in the morning and at bedtime.     metoprolol succinate (TOPROL-XL) 50 MG 24 hr tablet TAKE 1 TABLET BY MOUTH ONCE  DAILY TAKE WITH OR IMMEDIATELY  FOLLOWING A MEAL 90 tablet 1   Multiple Vitamins-Minerals (ONE-A-DAY MENS VITACRAVES PO) Take 1 tablet by mouth daily.      triamcinolone (KENALOG) 0.025 % cream      valsartan-hydrochlorothiazide (DIOVAN-HCT) 160-25 MG tablet TAKE 1 TABLET BY MOUTH DAILY 90 tablet 1   No current facility-administered medications for this visit.    Family History Family History  Problem Relation Age of Onset   Hypertension Mother    Alzheimer's disease Mother     Hypertension Father    Cancer Father        lung   Hypertension Sister    Hypertension Brother    Hypertension Sister    Hypertension Sister    Hypertension Brother    Prostate cancer Neg Hx    Kidney cancer Neg Hx       Social History Social History   Tobacco Use   Smoking status: Former    Packs/day: 0.50    Years: 10.00    Total pack years: 5.00    Types: Cigarettes    Quit date: 08/26/1985    Years since quitting: 37.3   Smokeless tobacco: Never   Tobacco comments:    smoking cessation materials not required  Vaping Use   Vaping Use: Never used  Substance Use Topics   Alcohol use: No   Drug use: No        Review of Systems  Constitutional: Negative.   HENT: Negative.    Eyes: Negative.   Respiratory: Negative.    Cardiovascular: Negative.   Gastrointestinal:  Positive for abdominal pain, constipation, nausea and vomiting.  Genitourinary:  Positive for frequency.  Skin: Negative.   Neurological: Negative.   Psychiatric/Behavioral: Negative.       Physical Exam Blood pressure (!) 167/76, pulse (!) 49, temperature 97.8 F (36.6 C), temperature source Oral, height 6' (1.829 m), weight 212 lb 12.8 oz (96.5 kg), SpO2 100 %. Last Weight  Most recent update: 12/11/2022  9:59 AM    Weight  96.5 kg (212 lb 12.8 oz)             CONSTITUTIONAL: Well developed, and nourished, appropriately responsive and aware without distress.   EYES: Sclera non-icteric.   EARS, NOSE, MOUTH AND THROAT:  The oropharynx is clear. Oral mucosa is pink and moist.   Hearing is intact to voice.  NECK: Trachea is midline, and there is no jugular venous distension.  LYMPH NODES:  Lymph nodes in the neck are not appreciated. RESPIRATORY:  Lungs are clear, and breath sounds are equal bilaterally.  Normal respiratory effort without pathologic use of accessory muscles. CARDIOVASCULAR: Heart is regular in rate and rhythm.   Well perfused.  GI: The abdomen is soft, nontender, and  nondistended. There were no palpable masses.  Midline scar is noted, no appreciable fascial defect.  I did not appreciate hepatosplenomegaly.  MUSCULOSKELETAL:  Symmetrical muscle tone appreciated in all four extremities.    SKIN: Skin turgor is normal. No pathologic skin lesions appreciated.  NEUROLOGIC:  Motor and sensation appear grossly normal.  Cranial nerves are grossly without defect. PSYCH:  Alert and oriented to person, place and time. Affect is appropriate for situation.  Data Reviewed I have personally reviewed what is currently available of the patient's imaging, recent labs and medical records.   Labs:     Latest Ref Rng & Units 12/09/2022    9:11 AM 12/01/2022    2:02 PM 12/23/2018   12:10 PM  CBC  WBC 3.4 - 10.8 x10E3/uL 7.7  10.5  11.0   Hemoglobin 13.0 - 17.7 g/dL 15.0  15.8  14.8   Hematocrit 37.5 - 51.0 % 43.9  47.1  44.6   Platelets 150 - 450 x10E3/uL 228  205  281       Latest Ref Rng & Units 12/09/2022    9:11 AM 12/01/2022    2:02 PM 07/16/2022   10:32 AM  CMP  Glucose 70 - 99 mg/dL 117  143  103   BUN 8 - 27 mg/dL 16  24  15   $ Creatinine 0.76 - 1.27 mg/dL 1.05  0.86  0.92   Sodium 134 - 144 mmol/L 139  135  138   Potassium 3.5 - 5.2 mmol/L 4.1  4.1  3.9   Chloride 96 - 106 mmol/L 101  102  98   CO2 20 - 29 mmol/L 25  24  26   $ Calcium 8.6 - 10.2 mg/dL 9.1  8.9  9.3   Total Protein 6.0 - 8.5 g/dL 6.4  7.1  6.6   Total Bilirubin 0.0 - 1.2 mg/dL 0.5  1.2  0.6   Alkaline Phos 44 - 121 IU/L 112  89  112   AST 0 - 40 IU/L 18  23  22   $ ALT 0 - 44 IU/L 17  19  18       $ Imaging: Radiological images reviewed:  CLINICAL DATA:  Right upper quadrant pain.   EXAM: ULTRASOUND ABDOMEN LIMITED RIGHT UPPER QUADRANT   COMPARISON:  CT earlier today.   FINDINGS: Gallbladder:   Physiologically distended. Intraluminal layering sludge. There may be tiny echogenic stones. No gallbladder wall thickening. No sonographic Murphy sign noted by sonographer.   Common bile  duct:   Diameter: 2 mm.   Liver:   No focal lesion identified. Portions the left lobe are not well visualized by ultrasound. Within normal limits in parenchymal echogenicity. Portal vein is patent on color Doppler imaging with normal direction of blood flow towards the liver.   Other: No right upper quadrant ascites.   IMPRESSION: 1. Gallbladder sludge and possible small stones. No sonographic findings of acute cholecystitis. 2. No biliary dilatation.     Electronically Signed   By: Keith Rake M.D.   On: 12/01/2022 18:59  CLINICAL DATA:  Five day history of nausea and lack of appetite with lower abdominal pain   EXAM: CT ABDOMEN AND PELVIS WITH CONTRAST   TECHNIQUE: Multidetector CT imaging of the abdomen and pelvis was performed using the standard protocol following bolus administration of intravenous contrast.   RADIATION DOSE REDUCTION: This exam was performed according to the departmental dose-optimization program which includes automated exposure control, adjustment of the mA and/or kV according to patient size and/or use of iterative reconstruction technique.   CONTRAST:  170m OMNIPAQUE IOHEXOL 300 MG/ML  SOLN   COMPARISON:  CT abdomen and pelvis dated 03/30/2017   FINDINGS: Lower chest: No focal consolidation or pulmonary nodule in the lung bases. No pleural effusion or pneumothorax demonstrated. Partially imaged heart size is normal.   Hepatobiliary: No focal hepatic lesions. No intra or extrahepatic biliary ductal dilation. Cholelithiasis.   Pancreas: No focal lesions or main ductal dilation.   Spleen: Normal in size without focal abnormality.   Adrenals/Urinary Tract: No adrenal nodules. No suspicious renal mass, calculi or hydronephrosis. Subcentimeter left renal hypodensities, too small to characterize but unchanged from 03/30/2017, likely cysts. Left parapelvic. No focal bladder wall thickening.  Stomach/Bowel: Small hiatal hernia.  Duodenal diverticulum in the second portion. Postsurgical changes of right lower quadrant small bowel. Anastomosis is intact. No evidence of bowel wall thickening, distention, or acute inflammatory changes. Submucosal fat attenuation within small bowel loops of the right lower quadrant, likely sequela of prior infection/inflammation. Colonic diverticulosis without acute diverticulitis. Status post appendectomy.   Vascular/Lymphatic: Aortic atherosclerosis. No enlarged abdominal or pelvic lymph nodes.   Reproductive: Prostate is unremarkable.   Other: No free fluid, fluid collection, or free air.   Musculoskeletal: Postsurgical changes from proximal left femoral fixation. Hardware appears intact. Unchanged grade 1 retrolisthesis at L4-5. Small fat-containing right inguinal hernia. Postsurgical changes of the anterior abdominal wall.   IMPRESSION: 1. No acute abnormality in the abdomen or pelvis. 2. Colonic diverticulosis without acute diverticulitis. 3. Cholelithiasis without evidence of acute cholecystitis. 4. Small fat-containing right inguinal hernia. 5. Aortic Atherosclerosis (ICD10-I70.0).     Electronically Signed   By: Darrin Nipper M.D.   On: 12/01/2022 17:48 Within last 24 hrs: No results found.  Assessment    Chronic calculus cholecystitis with biliary colic  Patient Active Problem List   Diagnosis Date Noted   Abdominal pain 12/09/2022   Prediabetes 12/02/2018   Osteopenia 07/19/2018   Obesity (BMI 30.0-34.9) 05/31/2018   Diverticulosis of colon 05/31/2018   Aortic calcification ([place]) 05/31/2018   History of ventral hernia repair 05/27/2018   Spinal stenosis at L4-L5 level 12/01/2016   BPH (benign prostatic hyperplasia) 08/27/2015   Peripheral neuropathy 08/27/2015   Hyperlipidemia    Hypertension     Plan    Robotic cholecystectomy with ICG major ductal imaging.  This was discussed thoroughly.  Optimal plan is for robotic cholecystectomy utilizing ICG  imaging. Risks and benefits have been discussed with the patient which include but are not limited to anesthesia, bleeding, infection, biliary ductal injury, resulting in leak or stenosis, other associated unanticipated injuries affiliated with laparoscopic surgery.   Noting he has a wide mesh, we will avoid laparoscopic incisions, in order to complete her cholecystectomy hopefully without violating the mesh or requiring an open operation. Reviewed that removing the gallbladder will only address the symptoms related to the gallbladder itself.  I believe there is the desire to proceed, accepting the risks with understanding.  Questions elicited and answered to satisfaction.    No guarantees ever expressed or implied.   Face-to-face time spent with the patient and accompanying care providers(if present) was 40 minutes, with more than 50% of the time spent counseling, educating, and coordinating care of the patient.    These notes generated with voice recognition software. I apologize for typographical errors.  Ronny Bacon M.D., FACS 12/11/2022, 10:31 AM

## 2022-12-11 ENCOUNTER — Telehealth: Payer: Self-pay | Admitting: Surgery

## 2022-12-11 ENCOUNTER — Encounter: Payer: Self-pay | Admitting: Surgery

## 2022-12-11 ENCOUNTER — Ambulatory Visit: Payer: Self-pay | Admitting: Surgery

## 2022-12-11 ENCOUNTER — Ambulatory Visit: Payer: Medicare Other | Admitting: Surgery

## 2022-12-11 VITALS — BP 167/76 | HR 49 | Temp 97.8°F | Ht 72.0 in | Wt 212.8 lb

## 2022-12-11 DIAGNOSIS — K8010 Calculus of gallbladder with chronic cholecystitis without obstruction: Secondary | ICD-10-CM

## 2022-12-11 NOTE — Telephone Encounter (Signed)
Patient has been advised of Pre-Admission date/time, and Surgery date at [facility].  Surgery Date: 12/15/22 Preadmission Testing Date: 12/12/22 (phone 8a-1p)  Patient has been made aware to call [phone], between 1-3:00pm the day before surgery, to find out what time to arrive for surgery.

## 2022-12-11 NOTE — Patient Instructions (Signed)
Our surgery scheduler Barbara will call you within 24-48 hours to get you scheduled. If you have not heard from her after 48 hours, please call our office. Have the blue sheet available when she calls to write down important information.   If you have any concerns or questions, please feel free to call our office.    Minimally Invasive Cholecystectomy  Minimally invasive cholecystectomy is surgery to remove the gallbladder. The gallbladder is a pear-shaped organ that lies beneath the liver on the right side of the body. The gallbladder stores bile, which is a fluid that helps the body digest fats. Cholecystectomy is often done to treat inflammation (irritation and swelling) of the gallbladder (cholecystitis). This condition is usually caused by a buildup of gallstones (cholelithiasis) in the gallbladder or when the fluid in the gall bladder becomes stagnant because gallstones get stuck in the ducts (tubes) and block the flow of bile. This can result in inflammation and pain. In severe cases, emergency surgery may be required. This procedure is done through small incisions in the abdomen, instead of one large incision. It is also called laparoscopic surgery. A thin scope with a camera (laparoscope) is inserted through one incision. Then surgical instruments are inserted through the other incisions. In some cases, a minimally invasive surgery may need to be changed to a surgery that is done through a larger incision. This is called open surgery. Tell a health care provider about: Any allergies you have. All medicines you are taking, including vitamins, herbs, eye drops, creams, and over-the-counter medicines. Any problems you or family members have had with anesthetic medicines. Any bleeding problems you have. Any surgeries you have had. Any medical conditions you have. Whether you are pregnant or may be pregnant. What are the risks? Generally, this is a safe procedure. However, problems may occur,  including: Infection. Bleeding. Allergic reactions to medicines. Damage to nearby structures or organs. A gallstone remaining in the common bile duct. The common bile duct carries bile from the gallbladder to the small intestine. A bile leak from the liver or cystic duct after your gallbladder is removed. What happens before the procedure? When to stop eating and drinking Follow instructions from your health care provider about what you may eat and drink before your procedure. These may include: 8 hours before the procedure Stop eating most foods. Do not eat meat, fried foods, or fatty foods. Eat only light foods, such as toast or crackers. All liquids are okay except energy drinks and alcohol. 6 hours before the procedure Stop eating. Drink only clear liquids, such as water, clear fruit juice, black coffee, plain tea, and sports drinks. Do not drink energy drinks or alcohol. 2 hours before the procedure Stop drinking all liquids. You may be allowed to take medicines with small sips of water. If you do not follow your health care provider's instructions, your procedure may be delayed or canceled. Medicines Ask your health care provider about: Changing or stopping your regular medicines. This is especially important if you are taking diabetes medicines or blood thinners. Taking medicines such as aspirin and ibuprofen. These medicines can thin your blood. Do not take these medicines unless your health care provider tells you to take them. Taking over-the-counter medicines, vitamins, herbs, and supplements. General instructions If you will be going home right after the procedure, plan to have a responsible adult: Take you home from the hospital or clinic. You will not be allowed to drive. Care for you for the time you are   told. Do not use any products that contain nicotine or tobacco for at least 4 weeks before the procedure. These products include cigarettes, chewing tobacco, and vaping  devices, such as e-cigarettes. If you need help quitting, ask your health care provider. Ask your health care provider: How your surgery site will be marked. What steps will be taken to help prevent infection. These may include: Removing hair at the surgery site. Washing skin with a germ-killing soap. Taking antibiotic medicine. What happens during the procedure?  An IV will be inserted into one of your veins. You will be given one or both of the following: A medicine to help you relax (sedative). A medicine to make you fall asleep (general anesthetic). Your surgeon will make several small incisions in your abdomen. The laparoscope will be inserted through one of the small incisions. The camera on the laparoscope will send images to a monitor in the operating room. This lets your surgeon see inside your abdomen. A gas will be pumped into your abdomen. This will expand your abdomen to give the surgeon more room to perform the surgery. Other tools that are needed for the procedure will be inserted through the other incisions. The gallbladder will be removed through one of the incisions. Your common bile duct may be examined. If stones are found in the common bile duct, they may be removed. After your gallbladder has been removed, the incisions will be closed with stitches (sutures), staples, or skin glue. Your incisions will be covered with a bandage (dressing). The procedure may vary among health care providers and hospitals. What happens after the procedure? Your blood pressure, heart rate, breathing rate, and blood oxygen level will be monitored until you leave the hospital or clinic. You will be given medicines as needed to control your pain. You may have a drain placed in the incision. The drain will be removed a day or two after the procedure. Summary Minimally invasive cholecystectomy, also called laparoscopic cholecystectomy, is surgery to remove the gallbladder using small  incisions. Tell your health care provider about all the medical conditions you have and all the medicines you are taking for those conditions. Before the procedure, follow instructions about when to stop eating and drinking and changing or stopping medicines. Plan to have a responsible adult care for you for the time you are told after you leave the hospital or clinic. This information is not intended to replace advice given to you by your health care provider. Make sure you discuss any questions you have with your health care provider. Document Revised: 04/16/2021 Document Reviewed: 04/16/2021 Elsevier Patient Education  2023 [employer]  

## 2022-12-12 ENCOUNTER — Encounter
Admission: RE | Admit: 2022-12-12 | Discharge: 2022-12-12 | Disposition: A | Discharge status: Home / Self Care | Payer: Medicare Other | Source: Ambulatory Visit | Attending: Surgery | Admitting: Surgery

## 2022-12-12 ENCOUNTER — Other Ambulatory Visit: Payer: Self-pay

## 2022-12-12 HISTORY — DX: Unspecified osteoarthritis, unspecified site: M19.90

## 2022-12-12 HISTORY — DX: Prediabetes: R73.03

## 2022-12-12 NOTE — Patient Instructions (Addendum)
Your procedure is scheduled on: 12/15/22 - Monday Report to the Registration Desk on the 1st floor of the [facility]. To find out your arrival time, please call [phone] between 1PM - 3PM on: 12/12/22 - Friday If your arrival time is 6:00 am, do not arrive before that time as the [facility] entrance doors do not open until 6:00 am.  REMEMBER: Instructions that are not followed completely may result in serious medical risk, up to and including death; or upon the discretion of your surgeon and anesthesiologist your surgery may need to be rescheduled.  Do not eat food or drink any liquids after midnight the night before surgery.  No gum chewing or hard candies.  One week prior to surgery: Stop Anti-inflammatories (NSAIDS) such as Advil, Aleve, Ibuprofen, Motrin, Naproxen, Naprosyn and Aspirin based products such as Excedrin, Goody's Powder, BC Powder.  Stop ANY OVER THE COUNTER supplements until after surgery, Multiple Vitamins-Minerals   You may take Tylenol if needed for pain up until the day of surgery.  Continue taking all prescribed medications with the exception of the following:  ASPIRIN hold beginning 12/12/22.  TAKE ONLY THESE MEDICATIONS THE MORNING OF SURGERY WITH A SIP OF WATER:  metoprolol succinate (TOPROL-XL)    No Alcohol for 24 hours before or after surgery.  No Smoking including e-cigarettes for 24 hours before surgery.  No chewable tobacco products for at least 6 hours before surgery.  No nicotine patches on the day of surgery.  Do not use any "recreational" drugs for at least a week (preferably 2 weeks) before your surgery.  Please be advised that the combination of cocaine and anesthesia may have negative outcomes, up to and including death. If you test positive for cocaine, your surgery will be cancelled.  On the morning of surgery brush your teeth with toothpaste and water, you may rinse your mouth with mouthwash if you wish. Do not swallow any  toothpaste or mouthwash.  Use CHG Soap or wipes as directed on instruction sheet.  Do not wear jewelry, make-up, hairpins, clips or nail polish.  Do not wear lotions, powders, or perfumes.   Do not shave body hair from the neck down 48 hours before surgery.  Contact lenses, hearing aids and dentures may not be worn into surgery.  Do not bring valuables to the hospital. [facility] is not responsible for any missing/lost belongings or valuables.   Notify your doctor if there is any change in your medical condition (cold, fever, infection).  Wear comfortable clothing (specific to your surgery type) to the hospital.  After surgery, you can help prevent lung complications by doing breathing exercises.  Take deep breaths and cough every 1-2 hours. Your doctor may order a device called an Incentive Spirometer to help you take deep breaths. When coughing or sneezing, hold a pillow firmly against your incision with both hands. This is called "splinting." Doing this helps protect your incision. It also decreases belly discomfort.  If you are being admitted to the hospital overnight, leave your suitcase in the car. After surgery it may be brought to your room.  In case of increased patient census, it may be necessary for you, the patient, to continue your postoperative care in the Same Day Surgery department.  If you are being discharged the day of surgery, you will not be allowed to drive home. You will need a responsible individual to drive you home and stay with you for 24 hours after surgery.   If you  are taking public transportation, you will need to have a responsible individual with you.  Please call the [facility] at [phone] if you have any questions about these instructions.  Surgery Visitation Policy:  Patients undergoing a surgery or procedure may have two family members or support persons with them as long as the person is not COVID-19 positive or  experiencing its symptoms.   Inpatient Visitation:    Visiting hours are 7 a.m. to 8 p.m. Up to four visitors are allowed at one time in a patient room. The visitors may rotate out with other people during the day. One designated support person (adult) may remain overnight.  Due to an increase in RSV and influenza rates and associated hospitalizations, children ages 47 and under will not be able to visit patients in [facility].  Masks continue to be strongly recommended.     Preparing for Surgery with CHLORHEXIDINE GLUCONATE (CHG) Soap  Chlorhexidine Gluconate (CHG) Soap  o An antiseptic cleaner that kills germs and bonds with the skin to continue killing germs even after washing  o Used for showering the night before surgery and morning of surgery  Before surgery, you can play an important role by reducing the number of germs on your skin.  CHG (Chlorhexidine gluconate) soap is an antiseptic cleanser which kills germs and bonds with the skin to continue killing germs even after washing.  Please do not use if you have an allergy to CHG or antibacterial soaps. If your skin becomes reddened/irritated stop using the CHG.  1. Shower the NIGHT BEFORE SURGERY and the MORNING OF SURGERY with CHG soap.  2. If you choose to wash your hair, wash your hair first as usual with your normal shampoo.  3. After shampooing, rinse your hair and body thoroughly to remove the shampoo.  4. Use CHG as you would any other liquid soap. You can apply CHG directly to the skin and wash gently with a scrungie or a clean washcloth.  5. Apply the CHG soap to your body only from the neck down. Do not use on open wounds or open sores. Avoid contact with your eyes, ears, mouth, and genitals (private parts). Wash face and genitals (private parts) with your normal soap.  6. Wash thoroughly, paying special attention to the area where your surgery will be performed.  7. Thoroughly rinse your body with  warm water.  8. Do not shower/wash with your normal soap after using and rinsing off the CHG soap.  9. Pat yourself dry with a clean towel.  10. Wear clean pajamas to bed the night before surgery.  12. Place clean sheets on your bed the night of your first shower and do not sleep with pets.  13. Shower again with the CHG soap on the day of surgery prior to arriving at the hospital.  14. Do not apply any deodorants/lotions/powders.  15. Please wear clean clothes to the hospital.

## 2022-12-14 MED ORDER — CELECOXIB 200 MG PO CAPS: 200.0000 mg | ORAL_CAPSULE | ORAL | Status: AC

## 2022-12-14 MED ORDER — CHLORHEXIDINE GLUCONATE CLOTH 2 % EX PADS
6.0000 | MEDICATED_PAD | Freq: Once | CUTANEOUS | Status: AC
  Administered 2022-12-15: 6 via TOPICAL

## 2022-12-14 MED ORDER — GABAPENTIN 300 MG PO CAPS: 300.0000 mg | ORAL_CAPSULE | ORAL | Status: AC

## 2022-12-14 MED ORDER — ORAL CARE MOUTH RINSE: 15.0000 mL | Freq: Once | OROMUCOSAL | Status: AC

## 2022-12-14 MED ORDER — LACTATED RINGERS IV SOLN: INTRAVENOUS | Status: DC

## 2022-12-14 MED ORDER — FAMOTIDINE 20 MG PO TABS: 20.0000 mg | ORAL_TABLET | Freq: Once | ORAL | Status: AC

## 2022-12-14 MED ORDER — ACETAMINOPHEN 500 MG PO TABS: 1000.0000 mg | ORAL_TABLET | ORAL | Status: AC

## 2022-12-14 MED ORDER — INDOCYANINE GREEN 25 MG IV SOLR
1.2500 mg | Freq: Once | INTRAVENOUS | Status: AC
  Administered 2022-12-15: 1.25 mg via INTRAVENOUS
  Filled 2022-12-14: qty 0.5

## 2022-12-14 MED ORDER — BUPIVACAINE LIPOSOME 1.3 % IJ SUSP: 20.0000 mL | Freq: Once | INTRAMUSCULAR | Status: DC

## 2022-12-14 MED ORDER — CHLORHEXIDINE GLUCONATE 0.12 % MT SOLN: 15.0000 mL | Freq: Once | OROMUCOSAL | Status: AC

## 2022-12-14 MED ORDER — CEFAZOLIN SODIUM-DEXTROSE 2-4 GM/100ML-% IV SOLN
2.0000 g | INTRAVENOUS | Status: AC
  Administered 2022-12-15: 2 g via INTRAVENOUS

## 2022-12-14 MED ORDER — CHLORHEXIDINE GLUCONATE CLOTH 2 % EX PADS: 6.0000 | MEDICATED_PAD | Freq: Once | CUTANEOUS | Status: DC

## 2022-12-15 ENCOUNTER — Ambulatory Visit: Payer: Medicare Other | Admitting: Certified Registered"

## 2022-12-15 ENCOUNTER — Ambulatory Visit
Admission: RE | Admit: 2022-12-15 | Discharge: 2022-12-15 | Disposition: A | Discharge status: Home / Self Care | Payer: Medicare Other | Attending: Surgery | Admitting: Surgery

## 2022-12-15 ENCOUNTER — Encounter: Payer: Self-pay | Admitting: Surgery

## 2022-12-15 ENCOUNTER — Other Ambulatory Visit: Payer: Self-pay

## 2022-12-15 ENCOUNTER — Encounter
Admission: RE | Disposition: A | Discharge status: Home / Self Care | Payer: Self-pay | Source: Home / Self Care | Attending: Surgery

## 2022-12-15 DIAGNOSIS — K660 Peritoneal adhesions (postprocedural) (postinfection): Secondary | ICD-10-CM | POA: Diagnosis not present

## 2022-12-15 DIAGNOSIS — Z79899 Other long term (current) drug therapy: Secondary | ICD-10-CM | POA: Insufficient documentation

## 2022-12-15 DIAGNOSIS — K8010 Calculus of gallbladder with chronic cholecystitis without obstruction: Secondary | ICD-10-CM | POA: Insufficient documentation

## 2022-12-15 DIAGNOSIS — E785 Hyperlipidemia, unspecified: Secondary | ICD-10-CM | POA: Diagnosis not present

## 2022-12-15 DIAGNOSIS — K811 Chronic cholecystitis: Secondary | ICD-10-CM | POA: Diagnosis not present

## 2022-12-15 DIAGNOSIS — Z8719 Personal history of other diseases of the digestive system: Secondary | ICD-10-CM

## 2022-12-15 DIAGNOSIS — I10 Essential (primary) hypertension: Secondary | ICD-10-CM | POA: Insufficient documentation

## 2022-12-15 DIAGNOSIS — Z87891 Personal history of nicotine dependence: Secondary | ICD-10-CM | POA: Diagnosis not present

## 2022-12-15 DIAGNOSIS — Z9889 Other specified postprocedural states: Secondary | ICD-10-CM | POA: Insufficient documentation

## 2022-12-15 DIAGNOSIS — K819 Cholecystitis, unspecified: Secondary | ICD-10-CM | POA: Diagnosis not present

## 2022-12-15 HISTORY — PX: LYSIS OF ADHESION: SHX5961

## 2022-12-15 SURGERY — CHOLECYSTECTOMY, ROBOT-ASSISTED, LAPAROSCOPIC
Anesthesia: General

## 2022-12-15 MED ORDER — DROPERIDOL 2.5 MG/ML IJ SOLN
0.6250 mg | Freq: Once | INTRAMUSCULAR | Status: AC
  Administered 2022-12-15: 0.625 mg via INTRAVENOUS

## 2022-12-15 MED ORDER — ROCURONIUM BROMIDE 100 MG/10ML IV SOLN
INTRAVENOUS | Status: DC | PRN
  Administered 2022-12-15: 50 mg via INTRAVENOUS

## 2022-12-15 MED ORDER — DROPERIDOL 2.5 MG/ML IJ SOLN
INTRAMUSCULAR | Status: AC
  Filled 2022-12-15: qty 2

## 2022-12-15 MED ORDER — HYDROCODONE-ACETAMINOPHEN 5-325 MG PO TABS: 1.0000 | ORAL_TABLET | Freq: Four times a day (QID) | ORAL | 0 refills | Status: DC | PRN

## 2022-12-15 MED ORDER — FENTANYL CITRATE (PF) 100 MCG/2ML IJ SOLN
INTRAMUSCULAR | Status: AC
  Filled 2022-12-15: qty 2

## 2022-12-15 MED ORDER — GABAPENTIN 300 MG PO CAPS
ORAL_CAPSULE | ORAL | Status: AC
  Administered 2022-12-15: 300 mg via ORAL
  Filled 2022-12-15: qty 1

## 2022-12-15 MED ORDER — LIDOCAINE HCL (CARDIAC) PF 100 MG/5ML IV SOSY
PREFILLED_SYRINGE | INTRAVENOUS | Status: DC | PRN
  Administered 2022-12-15: 100 mg via INTRAVENOUS

## 2022-12-15 MED ORDER — FENTANYL CITRATE (PF) 100 MCG/2ML IJ SOLN
INTRAMUSCULAR | Status: DC | PRN
  Administered 2022-12-15: 50 ug via INTRAVENOUS
  Administered 2022-12-15: 100 ug via INTRAVENOUS
  Administered 2022-12-15: 50 ug via INTRAVENOUS

## 2022-12-15 MED ORDER — CHLORHEXIDINE GLUCONATE 0.12 % MT SOLN
OROMUCOSAL | Status: AC
  Administered 2022-12-15: 15 mL via OROMUCOSAL
  Filled 2022-12-15: qty 15

## 2022-12-15 MED ORDER — DEXMEDETOMIDINE HCL IN NACL 80 MCG/20ML IV SOLN
INTRAVENOUS | Status: DC | PRN
  Administered 2022-12-15: 8 ug via BUCCAL
  Administered 2022-12-15: 12 ug via BUCCAL

## 2022-12-15 MED ORDER — KETAMINE HCL 50 MG/5ML IJ SOSY
PREFILLED_SYRINGE | INTRAMUSCULAR | Status: AC
  Filled 2022-12-15: qty 5

## 2022-12-15 MED ORDER — ACETAMINOPHEN 10 MG/ML IV SOLN: 1000.0000 mg | Freq: Once | INTRAVENOUS | Status: DC | PRN

## 2022-12-15 MED ORDER — EPINEPHRINE PF 1 MG/ML IJ SOLN
INTRAMUSCULAR | Status: AC
  Filled 2022-12-15: qty 1

## 2022-12-15 MED ORDER — OXYCODONE HCL 5 MG PO TABS
ORAL_TABLET | ORAL | Status: AC
  Filled 2022-12-15: qty 1

## 2022-12-15 MED ORDER — OXYCODONE HCL 5 MG PO TABS
5.0000 mg | ORAL_TABLET | Freq: Once | ORAL | Status: AC | PRN
  Administered 2022-12-15: 5 mg via ORAL

## 2022-12-15 MED ORDER — FENTANYL CITRATE (PF) 100 MCG/2ML IJ SOLN
25.0000 ug | INTRAMUSCULAR | Status: DC | PRN
  Administered 2022-12-15: 50 ug via INTRAVENOUS
  Administered 2022-12-15: 25 ug via INTRAVENOUS
  Administered 2022-12-15: 50 ug via INTRAVENOUS
  Administered 2022-12-15: 25 ug via INTRAVENOUS

## 2022-12-15 MED ORDER — ONDANSETRON HCL 4 MG/2ML IJ SOLN
4.0000 mg | Freq: Once | INTRAMUSCULAR | Status: AC | PRN
  Administered 2022-12-15: 4 mg via INTRAVENOUS

## 2022-12-15 MED ORDER — PROPOFOL 10 MG/ML IV BOLUS
INTRAVENOUS | Status: DC | PRN
  Administered 2022-12-15: 150 mg via INTRAVENOUS

## 2022-12-15 MED ORDER — SODIUM CHLORIDE 0.9 % IR SOLN
Status: DC | PRN
  Administered 2022-12-15: 4000 mL

## 2022-12-15 MED ORDER — ONDANSETRON HCL 4 MG/2ML IJ SOLN
INTRAMUSCULAR | Status: DC | PRN
  Administered 2022-12-15: 4 mg via INTRAVENOUS

## 2022-12-15 MED ORDER — EPHEDRINE SULFATE (PRESSORS) 50 MG/ML IJ SOLN
INTRAMUSCULAR | Status: DC | PRN
  Administered 2022-12-15 (×3): 10 mg via INTRAVENOUS

## 2022-12-15 MED ORDER — CEFAZOLIN SODIUM-DEXTROSE 2-4 GM/100ML-% IV SOLN
INTRAVENOUS | Status: AC
  Filled 2022-12-15: qty 100

## 2022-12-15 MED ORDER — PHENYLEPHRINE 80 MCG/ML (10ML) SYRINGE FOR IV PUSH (FOR BLOOD PRESSURE SUPPORT)
PREFILLED_SYRINGE | INTRAVENOUS | Status: DC | PRN
  Administered 2022-12-15: 40 ug via INTRAVENOUS

## 2022-12-15 MED ORDER — SUGAMMADEX SODIUM 200 MG/2ML IV SOLN
INTRAVENOUS | Status: DC | PRN
  Administered 2022-12-15: 200 mg via INTRAVENOUS

## 2022-12-15 MED ORDER — ONDANSETRON HCL 4 MG/2ML IJ SOLN
INTRAMUSCULAR | Status: AC
  Filled 2022-12-15: qty 2

## 2022-12-15 MED ORDER — FAMOTIDINE 20 MG PO TABS
ORAL_TABLET | ORAL | Status: AC
  Administered 2022-12-15: 20 mg via ORAL
  Filled 2022-12-15: qty 1

## 2022-12-15 MED ORDER — KETAMINE HCL 10 MG/ML IJ SOLN
INTRAMUSCULAR | Status: DC | PRN
  Administered 2022-12-15: 30 mg via INTRAVENOUS

## 2022-12-15 MED ORDER — DEXAMETHASONE SODIUM PHOSPHATE 10 MG/ML IJ SOLN
INTRAMUSCULAR | Status: DC | PRN
  Administered 2022-12-15: 10 mg via INTRAVENOUS

## 2022-12-15 MED ORDER — ACETAMINOPHEN 500 MG PO TABS
ORAL_TABLET | ORAL | Status: AC
  Administered 2022-12-15: 1000 mg via ORAL
  Filled 2022-12-15: qty 2

## 2022-12-15 MED ORDER — BUPIVACAINE HCL (PF) 0.25 % IJ SOLN
INTRAMUSCULAR | Status: AC
  Filled 2022-12-15: qty 30

## 2022-12-15 MED ORDER — BUPIVACAINE HCL 0.25 % IJ SOLN
INTRAMUSCULAR | Status: DC | PRN
  Administered 2022-12-15: 30 mL

## 2022-12-15 MED ORDER — CELECOXIB 200 MG PO CAPS
ORAL_CAPSULE | ORAL | Status: AC
  Administered 2022-12-15: 200 mg via ORAL
  Filled 2022-12-15: qty 1

## 2022-12-15 MED ORDER — OXYCODONE HCL 5 MG/5ML PO SOLN: 5.0000 mg | Freq: Once | ORAL | Status: AC | PRN

## 2022-12-15 SURGICAL SUPPLY — 48 items
ADH SKN CLS APL DERMABOND .7 (GAUZE/BANDAGES/DRESSINGS) ×2
BAG PRESSURE INF REUSE 3000 (BAG) IMPLANT
CLIP LIGATING HEM O LOK PURPLE (MISCELLANEOUS) ×2 IMPLANT
COVER TIP SHEARS 8 DVNC (MISCELLANEOUS) ×2 IMPLANT
COVER TIP SHEARS 8MM DA VINCI (MISCELLANEOUS) ×2
DERMABOND ADVANCED .7 DNX12 (GAUZE/BANDAGES/DRESSINGS) ×2 IMPLANT
DRAPE ARM DVNC X/XI (DISPOSABLE) ×8 IMPLANT
DRAPE COLUMN DVNC XI (DISPOSABLE) ×2 IMPLANT
DRAPE DA VINCI XI ARM (DISPOSABLE) ×8
DRAPE DA VINCI XI COLUMN (DISPOSABLE) ×2
ELECT CAUTERY BLADE 6.4 (BLADE) ×2 IMPLANT
GLOVE ORTHO TXT STRL SZ7.5 (GLOVE) ×4 IMPLANT
GOWN STRL REUS W/ TWL LRG LVL3 (GOWN DISPOSABLE) ×4 IMPLANT
GOWN STRL REUS W/ TWL XL LVL3 (GOWN DISPOSABLE) ×4 IMPLANT
GOWN STRL REUS W/TWL LRG LVL3 (GOWN DISPOSABLE) ×6
GOWN STRL REUS W/TWL XL LVL3 (GOWN DISPOSABLE) ×6
GRASPER SUT TROCAR 14GX15 (MISCELLANEOUS) ×2 IMPLANT
IRRIGATION STRYKERFLOW (MISCELLANEOUS) IMPLANT
IRRIGATOR STRYKERFLOW (MISCELLANEOUS) ×2
IRRIGATOR SUCT 8 DISP DVNC XI (IRRIGATION / IRRIGATOR) IMPLANT
IRRIGATOR SUCTION 8MM XI DISP (IRRIGATION / IRRIGATOR)
IV NS IRRIG 3000ML ARTHROMATIC (IV SOLUTION) IMPLANT
KIT PINK PAD W/HEAD ARE REST (MISCELLANEOUS) ×2
KIT PINK PAD W/HEAD ARM REST (MISCELLANEOUS) ×2 IMPLANT
KIT TURNOVER KIT A (KITS) ×2 IMPLANT
LABEL OR SOLS (LABEL) ×2 IMPLANT
MANIFOLD NEPTUNE II (INSTRUMENTS) ×2 IMPLANT
NDL INSUFFLATION 14GA 120MM (NEEDLE) ×2 IMPLANT
NEEDLE HYPO 22GX1.5 SAFETY (NEEDLE) ×2 IMPLANT
NEEDLE INSUFFLATION 14GA 120MM (NEEDLE) ×2 IMPLANT
NS IRRIG 500ML POUR BTL (IV SOLUTION) ×2 IMPLANT
PACK LAP CHOLECYSTECTOMY (MISCELLANEOUS) ×2 IMPLANT
SCISSORS METZENBAUM CVD 33 (INSTRUMENTS) IMPLANT
SEAL CANN UNIV 5-8 DVNC XI (MISCELLANEOUS) ×8 IMPLANT
SEAL XI 5MM-8MM UNIVERSAL (MISCELLANEOUS) ×8
SET TUBE SMOKE EVAC HIGH FLOW (TUBING) ×2 IMPLANT
SOL ELECTROSURG ANTI STICK (MISCELLANEOUS) ×2
SOLUTION ELECTROSURG ANTI STCK (MISCELLANEOUS) ×2 IMPLANT
SPIKE FLUID TRANSFER (MISCELLANEOUS) ×2 IMPLANT
SUT MNCRL 4-0 (SUTURE) ×2
SUT MNCRL 4-0 27XMFL (SUTURE) ×2
SUT VICRYL 0 UR6 27IN ABS (SUTURE) ×2 IMPLANT
SUTURE MNCRL 4-0 27XMF (SUTURE) ×2 IMPLANT
SYS BAG RETRIEVAL 10MM (BASKET) ×2
SYSTEM BAG RETRIEVAL 10MM (BASKET) ×2 IMPLANT
TRAP FLUID SMOKE EVACUATOR (MISCELLANEOUS) ×2 IMPLANT
TROCAR Z-THREAD FIOS 11X100 BL (TROCAR) ×2 IMPLANT
WATER STERILE IRR 500ML POUR (IV SOLUTION) ×2 IMPLANT

## 2022-12-15 NOTE — Anesthesia Postprocedure Evaluation (Signed)
Anesthesia Post Note  Patient: Drew Mcmahon  Procedure(s) Performed: XI ROBOTIC ASSISTED LAPAROSCOPIC CHOLECYSTECTOMY INDOCYANINE GREEN FLUORESCENCE IMAGING (ICG) LYSIS OF ADHESION  Patient location during evaluation: PACU Anesthesia Type: General Level of consciousness: awake and alert Pain management: pain level controlled Vital Signs Assessment: post-procedure vital signs reviewed and stable Respiratory status: spontaneous breathing, nonlabored ventilation, respiratory function stable and patient connected to nasal cannula oxygen Cardiovascular status: blood pressure returned to baseline and stable Postop Assessment: no apparent nausea or vomiting Anesthetic complications: no   No notable events documented.   Last Vitals:  Vitals:   12/15/22 1151 12/15/22 1502  BP: 135/75 (!) 156/64  Pulse: (!) 59 61  Resp: 16 19  Temp: 36.6 C (!) 36.4 C  SpO2: 100% 100%    Last Pain:  Vitals:   12/15/22 1502  TempSrc:   PainSc: Asleep                 Arita Miss

## 2022-12-15 NOTE — Anesthesia Procedure Notes (Signed)
Procedure Name: Intubation Date/Time: 12/15/2022 12:55 PM  Performed by: Biagio Borg, CRNAPre-anesthesia Checklist: Patient identified, Emergency Drugs available, Suction available and Patient being monitored Patient Re-evaluated:Patient Re-evaluated prior to induction Oxygen Delivery Method: Circle system utilized Preoxygenation: Pre-oxygenation with 100% oxygen Induction Type: IV induction Ventilation: Mask ventilation without difficulty Laryngoscope Size: McGraph and 4 Grade View: Grade I Tube type: Oral Tube size: 7.0 mm Number of attempts: 1 Airway Equipment and Method: Stylet Placement Confirmation: ETT inserted through vocal cords under direct vision, positive ETCO2 and breath sounds checked- equal and bilateral Secured at: 23 cm Tube secured with: Tape Dental Injury: Teeth and Oropharynx as per pre-operative assessment

## 2022-12-15 NOTE — Discharge Instructions (Signed)

## 2022-12-15 NOTE — Anesthesia Preprocedure Evaluation (Signed)
Anesthesia Evaluation  Patient identified by MRN, date of birth, ID band Patient awake    Reviewed: Allergy & Precautions, NPO status , Patient's Chart, lab work & pertinent test results  History of Anesthesia Complications Negative for: history of anesthetic complications  Airway Mallampati: II  TM Distance: >3 FB Neck ROM: Full    Dental  (+) Missing   Pulmonary neg sleep apnea, neg COPD, former smoker   breath sounds clear to auscultation- rhonchi (-) wheezing      Cardiovascular hypertension, Pt. on medications (-) CAD, (-) Past MI, (-) Cardiac Stents and (-) CABG  Rhythm:Regular Rate:Normal - Systolic murmurs and - Diastolic murmurs    Neuro/Psych neg Seizures negative neurological ROS  negative psych ROS   GI/Hepatic negative GI ROS, Neg liver ROS,,,  Endo/Other  negative endocrine ROSneg diabetes    Renal/GU negative Renal ROS     Musculoskeletal negative musculoskeletal ROS (+)    Abdominal  (+) + obese  Peds  Hematology negative hematology ROS (+)   Anesthesia Other Findings Past Medical History: No date: Elevated lipids 2007: Hip fracture ([place]) No date: Hyperlipidemia No date: Hypertension 07/19/2018: Osteopenia     Comment:  Sept 2019; next scan Sept 2021   Reproductive/Obstetrics negative OB ROS                             Anesthesia Physical Anesthesia Plan  ASA: 2  Anesthesia Plan: General   Post-op Pain Management:  Regional for Post-op pain and Minimal or no pain anticipated   Induction: Intravenous  PONV Risk Score and Plan: 2 and Ondansetron, TIVA and Propofol infusion  Airway Management Planned: Natural Airway  Additional Equipment: None  Intra-op Plan:   Post-operative Plan:   Informed Consent: I have reviewed the patients History and Physical, chart, labs and discussed the procedure including the risks, benefits and alternatives for the proposed  anesthesia with the patient or authorized representative who has indicated his/her understanding and acceptance.     Dental advisory given  Plan Discussed with: CRNA and Anesthesiologist  Anesthesia Plan Comments: (Discussed risks of anesthesia with patient, including possibility of difficulty with spontaneous ventilation under anesthesia necessitating airway intervention, PONV, and rare risks such as cardiac or respiratory or neurological events, and allergic reactions. Discussed the role of CRNA in patient's perioperative care. Patient understands.)        Anesthesia Quick Evaluation

## 2022-12-15 NOTE — Transfer of Care (Signed)
Immediate Anesthesia Transfer of Care Note  Patient: Drew Mcmahon  Procedure(s) Performed: XI ROBOTIC ASSISTED LAPAROSCOPIC CHOLECYSTECTOMY INDOCYANINE GREEN FLUORESCENCE IMAGING (ICG) LYSIS OF ADHESION  Patient Location: PACU  Anesthesia Type:General  Level of Consciousness: drowsy  Airway & Oxygen Therapy: Patient Spontanous Breathing and Patient connected to face mask oxygen  Post-op Assessment: Report given to RN and Post -op Vital signs reviewed and stable  Post vital signs: Reviewed and stable  Last Vitals:  Vitals Value Taken Time  BP 156/64   Temp    Pulse 66   Resp 21   SpO2 100     Last Pain:  Vitals:   12/15/22 1151  TempSrc: Temporal  PainSc: 0-No pain         Complications: No notable events documented.

## 2022-12-15 NOTE — Interval H&P Note (Signed)
History and Physical Interval Note:  12/15/2022 12:25 PM  Drew Mcmahon  has presented today for surgery, with the diagnosis of chronic calculous cholecystitis.  The various methods of treatment have been discussed with the patient and family. After consideration of risks, benefits and other options for treatment, the patient has consented to  Procedure(s): XI ROBOTIC ASSISTED LAPAROSCOPIC CHOLECYSTECTOMY (N/A) [place] (ICG) (N/A) as a surgical intervention.  The patient's history has been reviewed, patient examined, no change in status, stable for surgery.  I have reviewed the patient's chart and labs.  Questions were answered to the patient's satisfaction.     Ronny Bacon

## 2022-12-15 NOTE — Op Note (Signed)
Robotic cholecystectomy with Indocyamine Green Ductal Imaging.   Laparoscopic lysis of adhesions.  Pre-operative Diagnosis: Chronic calculus cholecystitis, status post ventral hernia repair.  Post-operative Diagnosis:  Same.  With extensive intra-abdominal adhesions.  Procedure: Robotic assisted laparoscopic cholecystectomy with Indocyamine Green Ductal Imaging.  Laparoscopic lysis of adhesions.  Surgeon: Ronny Bacon, M.D., FACS  Anesthesia: General. with endotracheal tube  Findings: Extensive adhesions of the omentum to the anterior abdominal wall.  Requiring 45 to 60 minutes of adhesiolysis.  Estimated Blood Loss: 25 mL         Drains: None         Specimens: Gallbladder           Complications: none  Procedure Details  The patient was seen again in the Holding Room.  1.25 mg dose of ICG was administered intravenously.   The benefits, complications, treatment options, risks and expected outcomes were again reviewed with the patient. The likelihood of improving the patient's symptoms with return to their baseline status is good.  The patient and/or family concurred with the proposed plan, giving informed consent, again alternatives reviewed.  The patient was taken to Operating Room, identified, and the procedure verified as robotic assisted laparoscopic cholecystectomy.  Prior to the induction of general anesthesia, antibiotic prophylaxis was administered. VTE prophylaxis was in place. General endotracheal anesthesia was then administered and tolerated well. The patient was positioned in the supine position.  After the induction, the abdomen was prepped with Chloraprep and draped in the sterile fashion.  A Time Out was held and the above information confirmed.  After local infiltration of quarter percent Marcaine with epinephrine, stab incision was made left upper quadrant.  Just below the costal margin at Palmer's point, approximately midclavicular line the Veres needle is passed  with sensation of the layers to penetrate the abdominal wall and into the peritoneum.  Saline drop test is confirmed peritoneal placement.  Insufflation is initiated with carbon dioxide to pressures of 15 mmHg.  A left para-rectus local infiltration with quarter percent Marcaine with epinephrine is utilized.  Made a 12 mm incision on the left lower quadrant site, I advanced an optical 18m port under direct visualization into the peritoneal cavity.  Once the peritoneum was penetrated, insufflation was continued.  The trocar was then advanced into the abdominal cavity under direct visualization.  Under direct vision I penetrated through 1 omental adhesion.  Under direct visualization I placed an 8.5 mm robotic trocar in the left lateral abdominal wall.  Utilizing laparoscopic shears I then proceeded with a 45 to 60 minutes of dissection of the omentum from the periumbilical and epigastric anterior abdominal wall.  There is no small bowel serosal or colonic adhesive process.  The mesh was visible through the posterior rectus sheath, and there were no appreciable defects. Two 8.5-mm ports were placed in the right lower quadrant and laterally, all under direct vision. All skin incisions  were infiltrated with a local anesthetic agent before making the incision and placing the trocars.  The patient was positioned  in reverse Trendelenburg, tilted the patient's left side down.  Da Vinci XI robot was then positioned on to the patient's left side, and docked.  The gallbladder was identified, the fundus grasped via the arm 4 Prograsp and retracted cephalad. Adhesions were lysed with scissors and cautery.  The infundibulum was identified grasped and retracted laterally, exposing the peritoneum overlying the triangle of Calot. This was then opened and dissected using cautery & scissors. An extended critical view of  the cystic duct and cystic artery was obtained, aided by the ICG via FireFly which improved localization of  the ductal anatomy.    The cystic duct was clearly identified and dissected to isolation.   Artery well isolated and clipped, and the cystic duct was triple clipped and divided with scissors, as close to the gallbladder neck as feasible, thus leaving two on the remaining stump.  The specimen side of the artery is sealed with bipolar and divided with monopolar scissors.   The gallbladder was taken from the gallbladder fossa in a retrograde fashion with the electrocautery. The gallbladder was removed and placed in an Endocatch bag.  The liver bed is inspected. Hemostasis was confirmed.  The robot was undocked and moved away from the operative field. 4 L irrigation was utilized and was aspirated clear.  The gallbladder and Endocatch sac were then removed through the left lower quadrant port site.   Inspection of the right upper quadrant was performed. No bleeding, bile duct injury or leak, or bowel injury was noted. The LLQ port site fascia was closed with interrumpted 0 Vicryl sutures using PMI/cone under direct visualization. Pneumoperitoneum was released and ports removed.  4-0 subcuticular Monocryl was used to close the skin. Dermabond was  applied.  The patient was then extubated and brought to the recovery room in stable condition. Sponge, lap, and needle counts were correct at closure and at the conclusion of the case.               Ronny Bacon, M.D., [facility] [place] 12/15/2022 3:16 PM

## 2022-12-16 ENCOUNTER — Encounter: Payer: Self-pay | Admitting: Surgery

## 2022-12-17 LAB — SURGICAL PATHOLOGY

## 2022-12-19 ENCOUNTER — Telehealth: Payer: Self-pay

## 2022-12-19 NOTE — Telephone Encounter (Signed)
Patient's wife called and states that the patient started having a little more pain last night and today. He was trying taper down on his Hydrocodone but is unable now. He denies any redness at surgery sites, no drainage, no fever or chills. Patient instructed to use ice to the surgical area several times a day. Patient also instructed to add Ibuprofen to his pain regimen. He may take 600 mg every 6-8 hours alternating with his Hydrocodone. Patient is aware to call back with any worsening symptoms or concerns.

## 2022-12-20 ENCOUNTER — Observation Stay: Payer: Medicare Other

## 2022-12-20 ENCOUNTER — Other Ambulatory Visit: Payer: Medicare Other

## 2022-12-20 ENCOUNTER — Inpatient Hospital Stay
Admission: EM | Admit: 2022-12-20 | Discharge: 2022-12-26 | DRG: 380 | Disposition: A | Discharge status: Home / Self Care | Payer: Medicare Other | Attending: Student | Admitting: Student

## 2022-12-20 ENCOUNTER — Other Ambulatory Visit: Payer: Self-pay

## 2022-12-20 ENCOUNTER — Emergency Department: Payer: Medicare Other

## 2022-12-20 DIAGNOSIS — D649 Anemia, unspecified: Secondary | ICD-10-CM | POA: Diagnosis not present

## 2022-12-20 DIAGNOSIS — I10 Essential (primary) hypertension: Secondary | ICD-10-CM

## 2022-12-20 DIAGNOSIS — K2980 Duodenitis without bleeding: Secondary | ICD-10-CM

## 2022-12-20 DIAGNOSIS — Z809 Family history of malignant neoplasm, unspecified: Secondary | ICD-10-CM

## 2022-12-20 DIAGNOSIS — K859 Acute pancreatitis without necrosis or infection, unspecified: Secondary | ICD-10-CM | POA: Diagnosis not present

## 2022-12-20 DIAGNOSIS — K269 Duodenal ulcer, unspecified as acute or chronic, without hemorrhage or perforation: Secondary | ICD-10-CM | POA: Insufficient documentation

## 2022-12-20 DIAGNOSIS — D72829 Elevated white blood cell count, unspecified: Secondary | ICD-10-CM | POA: Diagnosis not present

## 2022-12-20 DIAGNOSIS — K265 Chronic or unspecified duodenal ulcer with perforation: Principal | ICD-10-CM | POA: Diagnosis present

## 2022-12-20 DIAGNOSIS — E785 Hyperlipidemia, unspecified: Secondary | ICD-10-CM

## 2022-12-20 DIAGNOSIS — I700 Atherosclerosis of aorta: Secondary | ICD-10-CM | POA: Diagnosis not present

## 2022-12-20 DIAGNOSIS — Z9049 Acquired absence of other specified parts of digestive tract: Secondary | ICD-10-CM

## 2022-12-20 DIAGNOSIS — Z538 Procedure and treatment not carried out for other reasons: Secondary | ICD-10-CM | POA: Diagnosis not present

## 2022-12-20 DIAGNOSIS — Z79899 Other long term (current) drug therapy: Secondary | ICD-10-CM

## 2022-12-20 DIAGNOSIS — K5900 Constipation, unspecified: Secondary | ICD-10-CM | POA: Diagnosis present

## 2022-12-20 DIAGNOSIS — R932 Abnormal findings on diagnostic imaging of liver and biliary tract: Secondary | ICD-10-CM | POA: Diagnosis not present

## 2022-12-20 DIAGNOSIS — R112 Nausea with vomiting, unspecified: Secondary | ICD-10-CM

## 2022-12-20 DIAGNOSIS — K8050 Calculus of bile duct without cholangitis or cholecystitis without obstruction: Secondary | ICD-10-CM | POA: Diagnosis present

## 2022-12-20 DIAGNOSIS — Z87891 Personal history of nicotine dependence: Secondary | ICD-10-CM

## 2022-12-20 DIAGNOSIS — K2100 Gastro-esophageal reflux disease with esophagitis, without bleeding: Secondary | ICD-10-CM | POA: Diagnosis not present

## 2022-12-20 DIAGNOSIS — Z7982 Long term (current) use of aspirin: Secondary | ICD-10-CM

## 2022-12-20 DIAGNOSIS — Z6827 Body mass index (BMI) 27.0-27.9, adult: Secondary | ICD-10-CM

## 2022-12-20 DIAGNOSIS — Z8249 Family history of ischemic heart disease and other diseases of the circulatory system: Secondary | ICD-10-CM

## 2022-12-20 DIAGNOSIS — Z820 Family history of epilepsy and other diseases of the nervous system: Secondary | ICD-10-CM

## 2022-12-20 DIAGNOSIS — E43 Unspecified severe protein-calorie malnutrition: Secondary | ICD-10-CM | POA: Insufficient documentation

## 2022-12-20 DIAGNOSIS — R935 Abnormal findings on diagnostic imaging of other abdominal regions, including retroperitoneum: Secondary | ICD-10-CM | POA: Diagnosis not present

## 2022-12-20 DIAGNOSIS — K529 Noninfective gastroenteritis and colitis, unspecified: Secondary | ICD-10-CM | POA: Diagnosis not present

## 2022-12-20 DIAGNOSIS — E876 Hypokalemia: Secondary | ICD-10-CM | POA: Diagnosis present

## 2022-12-20 DIAGNOSIS — E119 Type 2 diabetes mellitus without complications: Secondary | ICD-10-CM | POA: Diagnosis present

## 2022-12-20 DIAGNOSIS — R1084 Generalized abdominal pain: Principal | ICD-10-CM

## 2022-12-20 DIAGNOSIS — K631 Perforation of intestine (nontraumatic): Secondary | ICD-10-CM | POA: Insufficient documentation

## 2022-12-20 DIAGNOSIS — N400 Enlarged prostate without lower urinary tract symptoms: Secondary | ICD-10-CM | POA: Diagnosis present

## 2022-12-20 DIAGNOSIS — R109 Unspecified abdominal pain: Secondary | ICD-10-CM

## 2022-12-20 LAB — PROTIME-INR
INR: 1.2 (ref 0.8–1.2)
Prothrombin Time: 15.1 seconds (ref 11.4–15.2)

## 2022-12-20 LAB — CBC
HCT: 41 % (ref 39.0–52.0)
Hemoglobin: 14.1 g/dL (ref 13.0–17.0)
MCH: 30.6 pg (ref 26.0–34.0)
MCHC: 34.4 g/dL (ref 30.0–36.0)
MCV: 88.9 fL (ref 80.0–100.0)
Platelets: 269 10*3/uL (ref 150–400)
RBC: 4.61 MIL/uL (ref 4.22–5.81)
RDW: 12.5 % (ref 11.5–15.5)
WBC: 10.1 10*3/uL (ref 4.0–10.5)
nRBC: 0 % (ref 0.0–0.2)

## 2022-12-20 LAB — MAGNESIUM: Magnesium: 2 mg/dL (ref 1.7–2.4)

## 2022-12-20 LAB — URINALYSIS, ROUTINE W REFLEX MICROSCOPIC
Bilirubin Urine: NEGATIVE
Glucose, UA: NEGATIVE mg/dL
Hgb urine dipstick: NEGATIVE
Ketones, ur: 20 mg/dL — AB
Leukocytes,Ua: NEGATIVE
Nitrite: NEGATIVE
Protein, ur: 30 mg/dL — AB
Specific Gravity, Urine: 1.024 (ref 1.005–1.030)
pH: 6 (ref 5.0–8.0)

## 2022-12-20 LAB — COMPREHENSIVE METABOLIC PANEL
ALT: 24 U/L (ref 0–44)
AST: 28 U/L (ref 15–41)
Albumin: 3.4 g/dL — ABNORMAL LOW (ref 3.5–5.0)
Alkaline Phosphatase: 73 U/L (ref 38–126)
Anion gap: 14 (ref 5–15)
BUN: 18 mg/dL (ref 8–23)
CO2: 25 mmol/L (ref 22–32)
Calcium: 9.2 mg/dL (ref 8.9–10.3)
Chloride: 99 mmol/L (ref 98–111)
Creatinine, Ser: 0.91 mg/dL (ref 0.61–1.24)
GFR, Estimated: >60 mL/min (ref >60)
Glucose, Bld: 172 mg/dL — ABNORMAL HIGH (ref 70–99)
Potassium: 3.4 mmol/L — ABNORMAL LOW (ref 3.5–5.1)
Sodium: 138 mmol/L (ref 135–145)
Total Bilirubin: 1.4 mg/dL — ABNORMAL HIGH (ref 0.3–1.2)
Total Protein: 7.4 g/dL (ref 6.5–8.1)

## 2022-12-20 LAB — LIPASE, BLOOD: Lipase: 27 U/L (ref 11–51)

## 2022-12-20 MED ORDER — ACETAMINOPHEN 325 MG PO TABS: 650.0000 mg | ORAL_TABLET | Freq: Four times a day (QID) | ORAL | Status: DC | PRN

## 2022-12-20 MED ORDER — ADULT MULTIVITAMIN W/MINERALS CH
1.0000 | ORAL_TABLET | Freq: Every day | ORAL | Status: DC
  Administered 2022-12-20 – 2022-12-21 (×2): 1 via ORAL
  Filled 2022-12-20 (×2): qty 1

## 2022-12-20 MED ORDER — ONDANSETRON HCL 4 MG/2ML IJ SOLN
4.0000 mg | Freq: Three times a day (TID) | INTRAMUSCULAR | Status: DC | PRN
  Administered 2022-12-20: 4 mg via INTRAVENOUS
  Filled 2022-12-20: qty 2

## 2022-12-20 MED ORDER — OXYCODONE-ACETAMINOPHEN 5-325 MG PO TABS: 1.0000 | ORAL_TABLET | ORAL | Status: DC | PRN

## 2022-12-20 MED ORDER — SODIUM CHLORIDE 0.9 % IV BOLUS
1000.0000 mL | Freq: Once | INTRAVENOUS | Status: AC
  Administered 2022-12-20: 1000 mL via INTRAVENOUS

## 2022-12-20 MED ORDER — METOCLOPRAMIDE HCL 5 MG/ML IJ SOLN
10.0000 mg | INTRAMUSCULAR | Status: AC
  Administered 2022-12-20: 10 mg via INTRAVENOUS
  Filled 2022-12-20: qty 2

## 2022-12-20 MED ORDER — SODIUM CHLORIDE 0.9 % IV SOLN: INTRAVENOUS | Status: DC

## 2022-12-20 MED ORDER — GADOBUTROL 1 MMOL/ML IV SOLN
10.0000 mL | Freq: Once | INTRAVENOUS | Status: AC | PRN
  Administered 2022-12-20: 10 mL via INTRAVENOUS

## 2022-12-20 MED ORDER — POTASSIUM CHLORIDE CRYS ER 20 MEQ PO TBCR
40.0000 meq | EXTENDED_RELEASE_TABLET | Freq: Once | ORAL | Status: AC
  Administered 2022-12-20: 40 meq via ORAL
  Filled 2022-12-20: qty 2

## 2022-12-20 MED ORDER — MORPHINE SULFATE (PF) 2 MG/ML IV SOLN: 2.0000 mg | INTRAVENOUS | Status: DC | PRN

## 2022-12-20 MED ORDER — MORPHINE SULFATE (PF) 4 MG/ML IV SOLN
4.0000 mg | Freq: Once | INTRAVENOUS | Status: AC
  Administered 2022-12-20: 4 mg via INTRAVENOUS
  Filled 2022-12-20: qty 1

## 2022-12-20 MED ORDER — PANTOPRAZOLE SODIUM 40 MG IV SOLR
40.0000 mg | Freq: Two times a day (BID) | INTRAVENOUS | Status: DC
  Administered 2022-12-20 – 2022-12-22 (×5): 40 mg via INTRAVENOUS
  Filled 2022-12-20 (×5): qty 10

## 2022-12-20 MED ORDER — SENNOSIDES-DOCUSATE SODIUM 8.6-50 MG PO TABS
1.0000 | ORAL_TABLET | Freq: Two times a day (BID) | ORAL | Status: DC
  Administered 2022-12-20 – 2022-12-21 (×3): 1 via ORAL
  Filled 2022-12-20 (×3): qty 1

## 2022-12-20 MED ORDER — PANTOPRAZOLE SODIUM 40 MG IV SOLR
40.0000 mg | Freq: Once | INTRAVENOUS | Status: AC
  Administered 2022-12-20: 40 mg via INTRAVENOUS
  Filled 2022-12-20: qty 10

## 2022-12-20 MED ORDER — IOHEXOL 300 MG/ML  SOLN
100.0000 mL | Freq: Once | INTRAMUSCULAR | Status: AC | PRN
  Administered 2022-12-20: 100 mL via INTRAVENOUS

## 2022-12-20 MED ORDER — SUCRALFATE 1 G PO TABS
1.0000 g | ORAL_TABLET | Freq: Once | ORAL | Status: AC
  Administered 2022-12-20: 1 g via ORAL
  Filled 2022-12-20: qty 1

## 2022-12-20 MED ORDER — ONDANSETRON HCL 4 MG/2ML IJ SOLN
4.0000 mg | Freq: Once | INTRAMUSCULAR | Status: AC
  Administered 2022-12-20: 4 mg via INTRAVENOUS
  Filled 2022-12-20: qty 2

## 2022-12-20 MED ORDER — SUCRALFATE 1 G PO TABS
1.0000 g | ORAL_TABLET | Freq: Three times a day (TID) | ORAL | Status: DC
  Administered 2022-12-20 – 2022-12-21 (×5): 1 g via ORAL
  Filled 2022-12-20 (×5): qty 1

## 2022-12-20 MED ORDER — POLYETHYLENE GLYCOL 3350 17 G PO PACK: 17.0000 g | PACK | Freq: Every day | ORAL | Status: DC | PRN

## 2022-12-20 MED ORDER — ATORVASTATIN CALCIUM 20 MG PO TABS
20.0000 mg | ORAL_TABLET | Freq: Every day | ORAL | Status: DC
  Administered 2022-12-20 – 2022-12-21 (×2): 20 mg via ORAL
  Filled 2022-12-20 (×2): qty 1

## 2022-12-20 MED ORDER — METOPROLOL SUCCINATE ER 50 MG PO TB24
50.0000 mg | ORAL_TABLET | Freq: Every day | ORAL | Status: DC
  Administered 2022-12-20 – 2022-12-22 (×3): 50 mg via ORAL
  Filled 2022-12-20 (×3): qty 1

## 2022-12-20 NOTE — ED Provider Notes (Signed)
I got signout on this patient pending reassessment after medications for intractable vomiting and epigastric pain.  CT Scan with signs of duodenitis or peptic ulcer disease.  Continues to have severe pain, nausea, vomited in the emergency department. He continues to have epigastric tenderness.  Vital signs are stable. Admit for intractable pain/vomiting.   Lucillie Garfinkel, MD 12/20/22 1630

## 2022-12-20 NOTE — ED Notes (Signed)
Patient transported to CT 

## 2022-12-20 NOTE — ED Triage Notes (Signed)
Pt had gallbladder surgery on Monday, vomiting since the surgery. Having abdominal pain as well.

## 2022-12-20 NOTE — H&P (Signed)
History and Physical    Drew Mcmahon [id number] DOB: 09/05/1946 DOA: 12/20/2022  Referring MD/NP/PA:   PCP: Jon Billings, NP   Patient coming from:  The patient is coming from home.  At baseline, pt is independent for most of ADL.        Chief Complaint: Abdominal pain  HPI: Drew Mcmahon is a 77 y.o. male with medical history significant of hypertension, hyperlipidemia, diabetes mellitus, BPH, who presents with abdominal pain.  Pt recently had robotic cholecystectomy due to chronic calculus cholecystitis on 2/19. Pt continues to have epigastric abdominal pain.  The pain is constant, 4 out of 10 in severity, aching, nonradiating.  Associated with nausea and nonbilious nonbloody vomiting, mostly dry heaves.  No diarrhea.  Patient is constipated.  No fever or chills.  Patient has poor appetite.  Denies chest pain, cough, shortness breath.  No symptoms of UTI.  Data reviewed independently and ED Course: pt was found to have WBC 10.1, GFR> 60, potassium 3.4, magnesium 2.0, temperature normal, blood pressure 133/60, heart rate 94, 77, RR 20, oxygen saturation 96% on room air. CT Scan showed signs of duodenitis vs. peptic ulcer disease, otherwise CT shows expected postoperative findings without any apparent complications.  Patient is placed on MedSurg bed for observation.  EKG: Not done in ED, will get one.     Review of Systems:   General: no fevers, chills, no body weight gain, has poor appetite, has fatigue HEENT: no blurry vision, hearing changes or sore throat Respiratory: no dyspnea, coughing, wheezing CV: no chest pain, no palpitations GI: has nausea, vomiting, abdominal pain, constipation GU: no dysuria, burning on urination, increased urinary frequency, hematuria  Ext: no leg edema Neuro: no unilateral weakness, numbness, or tingling, no vision change or hearing loss Skin: no rash, no skin tear. MSK: No muscle spasm, no deformity, no limitation of range of movement in  spin Heme: No easy bruising.  Travel history: No recent long distant travel.   Allergy: No Known Allergies  Past Medical History:  Diagnosis Date   Aortic calcification (HCC)    Arthritis    Elevated lipids    Hip fracture ([place]) 2007   History of hip fracture 07/04/2018   Hyperlipidemia    Hypertension    Osteopenia 07/19/2018   Sept 2019; next scan Sept 2021   Postoperative nausea and vomiting 12/24/2018   Pre-diabetes     Past Surgical History:  Procedure Laterality Date   APPENDECTOMY  04/03/2017   Incidental APPENDECTOMY;  Surgeon: [name], MD;  Location: [facility];  Service: General;;   BOWEL RESECTION  04/03/2017   Procedure: FOCAL SMALL BOWEL RESECTION;  Surgeon: [name], MD;  Location: [facility];  Service: General;;   COLON SURGERY     COLONOSCOPY WITH PROPOFOL N/A 06/12/2016   Procedure: COLONOSCOPY WITH PROPOFOL;  Surgeon: Manya Silvas, MD;  Location: [place];  Service: Endoscopy;  Laterality: N/A;   COLONOSCOPY WITH PROPOFOL N/A 05/07/2022   Procedure: COLONOSCOPY WITH PROPOFOL;  Surgeon: Jonathon Bellows, MD;  Location[facility];  Service: Gastroenterology;  Laterality: N/A;   CYST EXCISION  1973   lower spine   FRACTURE SURGERY Left 2007   hip   HOLEP-LASER ENUCLEATION OF THE PROSTATE WITH MORCELLATION N/A 06/14/2018   Procedure: HOLEP-LASER ENUCLEATION OF THE PROSTATE WITH MORCELLATION;  Surgeon: Hollice Espy, MD;  Location: [facility];  Service: Urology;  Laterality: N/A;   LAPAROSCOPY N/A 04/01/2017   Procedure: LAPAROSCOPY DIAGNOSTIC;  Surgeon: Bary Castilla,  Forest Gleason, MD;  Location: [facility];  Service: General;  Laterality: N/A;   LAPAROTOMY N/A 04/03/2017   Procedure: EXPLORATORY LAPAROTOMY;  Surgeon: [name], MD;  Location: [facility];  Service: General;  Laterality: N/A;   LYSIS OF ADHESION  04/03/2017   Procedure: LYSIS OF ADHESION;  Surgeon: [name], MD;  Location: [facility];  Service: General;;   LYSIS  OF ADHESION  12/15/2022   Procedure: LYSIS OF ADHESION;  Surgeon: Ronny Bacon, MD;  Location: [facility];  Service: General;;   VENTRAL HERNIA REPAIR N/A 12/13/2018   Procedure: HERNIA REPAIR VENTRAL ADULT WITH COMPONENT SEPARATOR MESH;  Surgeon: [name], MD;  Location: [facility];  Service: General;  Laterality: N/A;    Social History:  reports that he quit smoking about 37 years ago. His smoking use included cigarettes. He has a 5.00 pack-year smoking history. He has never used smokeless tobacco. He reports that he does not drink alcohol and does not use drugs.  Family History:  Family History  Problem Relation Age of Onset   Hypertension Mother    Alzheimer's disease Mother    Hypertension Father    Cancer Father        lung   Hypertension Sister    Hypertension Brother    Hypertension Sister    Hypertension Sister    Hypertension Brother    Prostate cancer Neg Hx    Kidney cancer Neg Hx      Prior to Admission medications   Medication Sig Start Date End Date Taking? Authorizing Provider  acetaminophen (TYLENOL) 500 MG tablet Take 1,000 mg by mouth every 6 (six) hours as needed.   Yes [provider]  aspirin 81 MG EC tablet Take 81 mg by mouth daily. Swallow whole.   Yes [provider]  Multiple Vitamins-Minerals (ONE-A-DAY MENS VITACRAVES PO) Take 1 tablet by mouth daily.    Yes [provider]  atorvastatin (LIPITOR) 20 MG tablet TAKE 1 TABLET BY MOUTH AT  BEDTIME 10/07/22   Jon Billings, NP  Docusate Calcium (STOOL SOFTENER PO) Take 1 capsule by mouth in the morning and at bedtime.    [provider]  HYDROcodone-acetaminophen (NORCO/VICODIN) 5-325 MG tablet Take 1 tablet by mouth every 6 (six) hours as needed for moderate pain. Patient not taking: Reported on 12/20/2022 12/15/22   Ronny Bacon, MD  metoprolol succinate (TOPROL-XL) 50 MG 24 hr tablet TAKE 1 TABLET BY MOUTH ONCE  DAILY TAKE WITH OR IMMEDIATELY  FOLLOWING A  MEAL 10/07/22   Jon Billings, NP  triamcinolone (KENALOG) 0.025 % cream daily. Patient not taking: Reported on 12/20/2022 11/07/21   [provider]  valsartan-hydrochlorothiazide (DIOVAN-HCT) 160-25 MG tablet TAKE 1 TABLET BY MOUTH DAILY 10/07/22   Jon Billings, NP    Physical Exam: Vitals:   12/20/22 1530 12/20/22 1550 12/20/22 1700 12/20/22 1751  BP: 133/60  (!) 130/56 129/63  Pulse: 77  66 65  Resp: '18  18 18  '$ Temp:  [id number] F (36.8 C)  98.2 F (36.8 C)  TempSrc:  Oral  Oral  SpO2: 96%  96% 100%   General: Not in acute distress HEENT:       Eyes: PERRL, EOMI, no scleral icterus.       ENT: No discharge from the ears and nose, no pharynx injection, no tonsillar enlargement.        Neck: No JVD, no bruit, no mass felt. Heme: No neck lymph node enlargement. Cardiac: S1/S2, RRR, No  murmurs, No gallops or rubs. Respiratory: No rales, wheezing, rhonchi or rubs. GI: Soft, nondistended, has tenderness in epigastric area, no rebound pain, no organomegaly, BS present. GU: No hematuria Ext: No pitting leg edema bilaterally. 1+DP/PT pulse bilaterally. Musculoskeletal: No joint deformities, No joint redness or warmth, no limitation of ROM in spin. Skin: No rashes.  Neuro: Alert, oriented X3, cranial nerves II-XII grossly intact, moves all extremities normally.  Psych: Patient is not psychotic, no suicidal or hemocidal ideation.  Labs on Admission: I have personally reviewed following labs and imaging studies  CBC: Recent Labs  Lab 12/20/22 1150  WBC 10.1  HGB 14.1  HCT 41.0  MCV 88.9  PLT [id number]   Basic Metabolic Panel: Recent Labs  Lab 12/20/22 1150  NA 138  K 3.4*  CL 99  CO2 25  GLUCOSE 172*  BUN 18  CREATININE 0.91  CALCIUM 9.2  MG 2.0   GFR: Estimated Creatinine Clearance: 83.2 mL/min (by C-G formula based on SCr of 0.91 mg/dL). Liver Function Tests: Recent Labs  Lab 12/20/22 1150  AST 28  ALT 24  ALKPHOS 73  BILITOT 1.4*  PROT 7.4   ALBUMIN 3.4*   Recent Labs  Lab 12/20/22 1150  LIPASE 27   No results for input(s): "AMMONIA" in the last 168 hours. Coagulation Profile: Recent Labs  Lab 12/20/22 1150  INR 1.2   Cardiac Enzymes: No results for input(s): "CKTOTAL", "CKMB", "CKMBINDEX", "TROPONINI" in the last 168 hours. BNP (last 3 results) No results for input(s): "PROBNP" in the last 8760 hours. HbA1C: No results for input(s): "HGBA1C" in the last 72 hours. CBG: No results for input(s): "GLUCAP" in the last 168 hours. Lipid Profile: No results for input(s): "CHOL", "HDL", "LDLCALC", "TRIG", "CHOLHDL", "LDLDIRECT" in the last 72 hours. Thyroid Function Tests: No results for input(s): "TSH", "T4TOTAL", "FREET4", "T3FREE", "THYROIDAB" in the last 72 hours. Anemia Panel: No results for input(s): "VITAMINB12", "FOLATE", "FERRITIN", "TIBC", "IRON", "RETICCTPCT" in the last 72 hours. Urine analysis:    Component Value Date/Time   COLORURINE AMBER (A) 12/20/2022 1150   APPEARANCEUR CLEAR (A) 12/20/2022 1150   APPEARANCEUR Clear 12/09/2022 0910   LABSPEC 1.024 12/20/2022 1150   PHURINE 6.0 12/20/2022 1150   GLUCOSEU NEGATIVE 12/20/2022 1150   HGBUR NEGATIVE 12/20/2022 1150   BILIRUBINUR NEGATIVE 12/20/2022 1150   BILIRUBINUR Negative 12/09/2022 0910   KETONESUR 20 (A) 12/20/2022 1150   PROTEINUR 30 (A) 12/20/2022 1150   NITRITE NEGATIVE 12/20/2022 1150   LEUKOCYTESUR NEGATIVE 12/20/2022 1150   Sepsis Labs: '@LABRCNTIP'$ (procalcitonin:4,lacticidven:4) )No results found for this or any previous visit (from the past 240 hour(s)).   Radiological Exams on Admission: CT ABDOMEN PELVIS W CONTRAST  Result Date: 12/20/2022 CLINICAL DATA:  Abdominal pain. History of cholecystectomy on 12/15/2022 EXAM: CT ABDOMEN AND PELVIS WITH CONTRAST TECHNIQUE: Multidetector CT imaging of the abdomen and pelvis was performed using the standard protocol following bolus administration of intravenous contrast. RADIATION DOSE  REDUCTION: This exam was performed according to the departmental dose-optimization program which includes automated exposure control, adjustment of the mA and/or kV according to patient size and/or use of iterative reconstruction technique. CONTRAST:  12m OMNIPAQUE IOHEXOL 300 MG/ML  SOLN COMPARISON:  CT 12/01/2022 FINDINGS: Lower chest: No acute abnormality. Hepatobiliary: No focal liver abnormality is seen. Interval cholecystectomy with mild stranding and trace fluid in the gallbladder fossa. No organized fluid collection. No biliary dilatation. Pancreas: Unremarkable. No pancreatic ductal dilatation or surrounding inflammatory changes. Spleen: Normal in size without focal abnormality. Adrenals/Urinary Tract:  Unremarkable adrenal glands. Kidneys enhance symmetrically without solid lesion, stone, or hydronephrosis. Ureters are nondilated. Urinary bladder appears unremarkable for the degree of distention. Stomach/Bowel: Stomach within normal limits. Mild thickening of the second portion of the duodenum with mild adjacent fat stranding. There are a few duodenal diverticula, largest measuring 4.7 cm in diameter along the second portion. No dilated loops of bowel. Scattered colonic diverticulosis. No focal colonic wall thickening or inflammatory changes. Appendix appears to be surgically absent. Vascular/Lymphatic: Aortic atherosclerosis. No enlarged abdominal or pelvic lymph nodes. Reproductive: Prostate is unremarkable. Other: Small volume of hyperdense free fluid within the dependent portion of the pelvis. No ascites. No organized abdominopelvic fluid collections. No pneumoperitoneum. Musculoskeletal: No new or acute bony findings. Prior left hip pinning. IMPRESSION: 1. Interval cholecystectomy with mild stranding and trace fluid in the gallbladder fossa. No organized fluid collection. 2. Mild thickening of the second portion of the duodenum with adjacent fat stranding. Findings may represent duodenitis versus  peptic ulcer disease. There are a few duodenal diverticula, largest measuring 4.7 cm in diameter along the second portion. 3. Small volume of hyperdense free fluid within the dependent portion of the pelvis, likely representing a small amount of hemoperitoneum related to the recent intra-abdominal surgery. No organized abdominopelvic fluid collection or abscess. 4. Colonic diverticulosis without evidence of acute diverticulitis. 5. Aortic atherosclerosis (ICD10-I70.0). Electronically Signed   By: Davina Poke D.O.   On: 12/20/2022 14:28      Assessment/Plan Principal Problem:   Abdominal pain Active Problems:   Duodenitis   Hypokalemia   Hyperlipidemia   Hypertension   Assessment and Plan:  Abdominal pain and duodenitis: CT Scan showed signs of duodenitis vs. peptic ulcer disease, otherwise CT shows expected postoperative findings without any apparent complications. Consulted Dr. Haig Prophet of GI. He recommended to start patient on Protonix, also get MRI/MRCP with contrast vs HIDA scan to r/o bile leak.  -place in med-surg bed for obs -Protonix 40 mg twice daily -Carafate 1 g 3 times daily -As needed Percocet and morphine for pain -will order MRCP   Hypokalemia: Potassium 3.4, magnesium 2.0 -Repleted potassium  Hyperlipidemia -Lipitor  Hypertension -IV hydralazine as needed -Continue home metoprolol -Hold Diovan-HCTZ since patient is at risk of dehydration     DVT ppx: SCD  Code Status: Full code  Family Communication:    Yes, patient's  wife  at bed side.      Disposition Plan:  Anticipate discharge back to previous environment  Consults called:  Dr. Haig Prophet of GI  Admission status and Level of care: Med-Surg:    for obs     Dispo: The patient is from: Home              Anticipated d/c is to: Home              Anticipated d/c date is: 1 day              Patient currently is not medically stable to d/c.    Severity of Illness:  The appropriate patient  status for this patient is OBSERVATION. Observation status is judged to be reasonable and necessary in order to provide the required intensity of service to ensure the patient's safety. The patient's presenting symptoms, physical exam findings, and initial radiographic and laboratory data in the context of their medical condition is felt to place them at decreased risk for further clinical deterioration. Furthermore, it is anticipated that the patient will be medically stable for discharge from the  hospital within 2 midnights of admission.        Date of Service 12/20/2022    Ivor Costa [facility]   If 7PM-7AM, please contact night-coverage [email or web address] 12/20/2022, 6:55 PM

## 2022-12-20 NOTE — ED Notes (Signed)
Pt states he had gallbladder surgery on Monday after which he has not had a BM since the procedure, is passing gas. Pt states he only had enough pain medicine for 3 days and has been in pain ever since. Pt also describes extreme vomiting for several days about 12 times yesterday. Today has been dry heaving, and has not had an appetite/eaten much since the procedure, and when he does eat he vomits it up shortly thereafter.

## 2022-12-20 NOTE — ED Provider Notes (Signed)
[facility] Provider Note    Event Date/Time   First MD Initiated Contact with Patient 12/20/22 1223     (approximate)   History   Chief Complaint: Emesis   HPI  Drew Mcmahon is a 77 y.o. male with a history of hyperlipidemia, hypertension, ventral hernia mesh repair, laparoscopic cholecystectomy 5 days ago who comes ED complaining of continued upper abdominal pain since surgery, severe, waxing waning, no aggravating or alleviating factors.  Associated with hiccups, nausea, vomiting.  No bowel movements in the last 5 days.  He is passing flatus.  No chest pain or shortness of breath.  No dizziness or syncope.     Physical Exam   Triage Vital Signs: ED Triage Vitals  Enc Vitals Group     BP 12/20/22 1148 (!) 143/62     Pulse Rate 12/20/22 1148 94     Resp 12/20/22 1148 20     Temp 12/20/22 1148 97.8 F (36.6 C)     Temp Source 12/20/22 1148 Oral     SpO2 12/20/22 1148 98 %     Weight --      Height --      Head Circumference --      Peak Flow --      Pain Score 12/20/22 1147 5     Pain Loc --      Pain Edu? --      Excl. in [place]? --     Most recent vital signs: Vitals:   12/20/22 1230 12/20/22 1300  BP: (!) 173/73 (!) 153/66  Pulse: 69 61  Resp:  20  Temp:    SpO2: 100% 99%    General: Awake, no distress.  CV:  Good peripheral perfusion.  Regular rate and rhythm Resp:  Normal effort.  Clear to auscultation bilaterally Abd:  Mildly distended without peritoneal signs.  Mild generalized tenderness.  Surgical incisions are healing well without inflammatory changes. Other:  No lower extremity edema.   ED Results / Procedures / Treatments   Labs (all labs ordered are listed, but only abnormal results are displayed) Labs Reviewed  COMPREHENSIVE METABOLIC PANEL - Abnormal; Notable for the following components:      Result Value   Potassium 3.4 (*)    Glucose, Bld 172 (*)    Albumin 3.4 (*)    Total Bilirubin 1.4 (*)    All other  components within normal limits  URINALYSIS, ROUTINE W REFLEX MICROSCOPIC - Abnormal; Notable for the following components:   Color, Urine AMBER (*)    APPearance CLEAR (*)    Ketones, ur 20 (*)    Protein, ur 30 (*)    Bacteria, UA RARE (*)    All other components within normal limits  LIPASE, BLOOD  CBC     EKG    RADIOLOGY CT abdomen pelvis interpreted by me, negative for bowel obstruction or pneumoperitoneum.  Radiology report reviewed.   PROCEDURES:  Procedures   MEDICATIONS ORDERED IN ED: Medications  pantoprazole (PROTONIX) injection 40 mg (has no administration in time range)  metoCLOPramide (REGLAN) injection 10 mg (has no administration in time range)  sucralfate (CARAFATE) tablet 1 g (has no administration in time range)  ondansetron (ZOFRAN) injection 4 mg (4 mg Intravenous Given 12/20/22 1300)  sodium chloride 0.9 % bolus 1,000 mL (0 mLs Intravenous Stopped 12/20/22 1416)  morphine (PF) 4 MG/ML injection 4 mg (4 mg Intravenous Given 12/20/22 1300)  iohexol (OMNIPAQUE) 300 MG/ML solution 100 mL (100 mLs  Intravenous Contrast Given 12/20/22 1333)     IMPRESSION / MDM / ASSESSMENT AND PLAN / ED COURSE  I reviewed the triage vital signs and the nursing notes.  DDx: Bowel obstruction, bowel perforation, intra-abdominal abscess, pancreatitis, gastritis  Patient's presentation is most consistent with acute presentation with potential threat to life or bodily function.  Patient presents with postoperative abdominal pain which is severe, along with constipation.  Will obtain CT scan to evaluate for bowel obstruction versus surgical complication.  Will give IV fluids, morphine and Zofran.   Clinical Course as of 12/20/22 1449  Sat Dec 20, 2022  1448 Patient still having nausea and episodes of vomiting.  CT shows evidence of peptic ulcer disease/duodenitis.  Will give additional Protonix Reglan and Carafate.  If symptoms or not adequately controlled he may require  hospitalization for intractable vomiting.  Otherwise CT shows expected postoperative findings without any apparent complications. [PS]    Clinical Course User Index [PS] Carrie Mew, MD     FINAL CLINICAL IMPRESSION(S) / ED DIAGNOSES   Final diagnoses:  Generalized abdominal pain  Nausea and vomiting, unspecified vomiting type     Rx / DC Orders   ED Discharge Orders     None        Note:  This document was prepared using Dragon voice recognition software and may include unintentional dictation errors.   Carrie Mew, MD 12/20/22 [phone]

## 2022-12-21 DIAGNOSIS — E119 Type 2 diabetes mellitus without complications: Secondary | ICD-10-CM | POA: Diagnosis present

## 2022-12-21 DIAGNOSIS — R1084 Generalized abdominal pain: Secondary | ICD-10-CM | POA: Diagnosis present

## 2022-12-21 DIAGNOSIS — I10 Essential (primary) hypertension: Secondary | ICD-10-CM | POA: Diagnosis present

## 2022-12-21 DIAGNOSIS — Z87891 Personal history of nicotine dependence: Secondary | ICD-10-CM | POA: Diagnosis not present

## 2022-12-21 DIAGNOSIS — E785 Hyperlipidemia, unspecified: Secondary | ICD-10-CM | POA: Diagnosis present

## 2022-12-21 DIAGNOSIS — D72829 Elevated white blood cell count, unspecified: Secondary | ICD-10-CM | POA: Diagnosis not present

## 2022-12-21 DIAGNOSIS — Z0389 Encounter for observation for other suspected diseases and conditions ruled out: Secondary | ICD-10-CM | POA: Diagnosis not present

## 2022-12-21 DIAGNOSIS — K2980 Duodenitis without bleeding: Secondary | ICD-10-CM | POA: Diagnosis present

## 2022-12-21 DIAGNOSIS — Z538 Procedure and treatment not carried out for other reasons: Secondary | ICD-10-CM | POA: Diagnosis not present

## 2022-12-21 DIAGNOSIS — Z9049 Acquired absence of other specified parts of digestive tract: Secondary | ICD-10-CM | POA: Diagnosis not present

## 2022-12-21 DIAGNOSIS — D649 Anemia, unspecified: Secondary | ICD-10-CM

## 2022-12-21 DIAGNOSIS — K265 Chronic or unspecified duodenal ulcer with perforation: Secondary | ICD-10-CM | POA: Diagnosis present

## 2022-12-21 DIAGNOSIS — E43 Unspecified severe protein-calorie malnutrition: Secondary | ICD-10-CM | POA: Diagnosis present

## 2022-12-21 DIAGNOSIS — E876 Hypokalemia: Secondary | ICD-10-CM | POA: Diagnosis present

## 2022-12-21 DIAGNOSIS — N400 Enlarged prostate without lower urinary tract symptoms: Secondary | ICD-10-CM | POA: Diagnosis present

## 2022-12-21 DIAGNOSIS — Z79899 Other long term (current) drug therapy: Secondary | ICD-10-CM | POA: Diagnosis not present

## 2022-12-21 DIAGNOSIS — K631 Perforation of intestine (nontraumatic): Secondary | ICD-10-CM | POA: Diagnosis not present

## 2022-12-21 DIAGNOSIS — Z809 Family history of malignant neoplasm, unspecified: Secondary | ICD-10-CM | POA: Diagnosis not present

## 2022-12-21 DIAGNOSIS — Z820 Family history of epilepsy and other diseases of the nervous system: Secondary | ICD-10-CM | POA: Diagnosis not present

## 2022-12-21 DIAGNOSIS — Z8249 Family history of ischemic heart disease and other diseases of the circulatory system: Secondary | ICD-10-CM | POA: Diagnosis not present

## 2022-12-21 DIAGNOSIS — Z6827 Body mass index (BMI) 27.0-27.9, adult: Secondary | ICD-10-CM | POA: Diagnosis not present

## 2022-12-21 DIAGNOSIS — K269 Duodenal ulcer, unspecified as acute or chronic, without hemorrhage or perforation: Secondary | ICD-10-CM | POA: Diagnosis not present

## 2022-12-21 DIAGNOSIS — K8050 Calculus of bile duct without cholangitis or cholecystitis without obstruction: Secondary | ICD-10-CM | POA: Diagnosis present

## 2022-12-21 DIAGNOSIS — R109 Unspecified abdominal pain: Secondary | ICD-10-CM | POA: Diagnosis not present

## 2022-12-21 DIAGNOSIS — N281 Cyst of kidney, acquired: Secondary | ICD-10-CM | POA: Diagnosis not present

## 2022-12-21 DIAGNOSIS — R600 Localized edema: Secondary | ICD-10-CM | POA: Diagnosis not present

## 2022-12-21 DIAGNOSIS — K2100 Gastro-esophageal reflux disease with esophagitis, without bleeding: Secondary | ICD-10-CM | POA: Diagnosis present

## 2022-12-21 DIAGNOSIS — K5900 Constipation, unspecified: Secondary | ICD-10-CM | POA: Diagnosis present

## 2022-12-21 DIAGNOSIS — Z7982 Long term (current) use of aspirin: Secondary | ICD-10-CM | POA: Diagnosis not present

## 2022-12-21 LAB — BASIC METABOLIC PANEL
Anion gap: 6 (ref 5–15)
BUN: 19 mg/dL (ref 8–23)
CO2: 24 mmol/L (ref 22–32)
Calcium: 8.2 mg/dL — ABNORMAL LOW (ref 8.9–10.3)
Chloride: 108 mmol/L (ref 98–111)
Creatinine, Ser: 0.8 mg/dL (ref 0.61–1.24)
GFR, Estimated: >60 mL/min (ref >60)
Glucose, Bld: 102 mg/dL — ABNORMAL HIGH (ref 70–99)
Potassium: 3.7 mmol/L (ref 3.5–5.1)
Sodium: 138 mmol/L (ref 135–145)

## 2022-12-21 LAB — CBC
HCT: 33.6 % — ABNORMAL LOW (ref 39.0–52.0)
Hemoglobin: 11.2 g/dL — ABNORMAL LOW (ref 13.0–17.0)
MCH: 30.2 pg (ref 26.0–34.0)
MCHC: 33.3 g/dL (ref 30.0–36.0)
MCV: 90.6 fL (ref 80.0–100.0)
Platelets: 185 10*3/uL (ref 150–400)
RBC: 3.71 MIL/uL — ABNORMAL LOW (ref 4.22–5.81)
RDW: 12.7 % (ref 11.5–15.5)
WBC: 8.1 10*3/uL (ref 4.0–10.5)
nRBC: 0 % (ref 0.0–0.2)

## 2022-12-21 LAB — GLUCOSE, CAPILLARY: Glucose-Capillary: 102 mg/dL — ABNORMAL HIGH (ref 70–99)

## 2022-12-21 MED ORDER — IRBESARTAN 150 MG PO TABS
150.0000 mg | ORAL_TABLET | Freq: Every day | ORAL | Status: DC
  Administered 2022-12-21: 150 mg via ORAL
  Filled 2022-12-21: qty 1

## 2022-12-21 MED ORDER — SODIUM CHLORIDE 0.9 % IV SOLN: INTRAVENOUS | Status: DC

## 2022-12-21 NOTE — Consult Note (Signed)
Consultation  Referring Provider:   Hospitalist Admit date: 12/20/2022 Consult date: 12/21/2022         Reason for Consultation: Abdominal pain              HPI:   Drew Mcmahon is a 77 y.o. gentleman with history of hypertension and recent cholecystectomy for presumed biliary colic/chronic cholecystitis. Patient had his surgery this past Monday and after his pain medicines wore off, had persistent symptoms similar to what he was having prior to surgery. Nausea, vomiting, and abdominal pain. On presentation here an MRCP was done to rule out bile leak and choledocholithiasis was noted with stranding around the head of pancreas and a duodenal diverticulum concerning for duodenitis vs PUD vs duodenal diverticulitis. Today he says his symptoms are much improved. His lipase was normal. He only had mildly elevated bilirubin. He did have a history of small bowel surgery for SBO. No weight loss surgeries. No blood thinners.   Past Medical History:  Diagnosis Date   Aortic calcification (HCC)    Arthritis    Elevated lipids    Hip fracture ([place]) 2007   History of hip fracture 07/04/2018   Hyperlipidemia    Hypertension    Osteopenia 07/19/2018   Sept 2019; next scan Sept 2021   Postoperative nausea and vomiting 12/24/2018   Pre-diabetes     Past Surgical History:  Procedure Laterality Date   APPENDECTOMY  04/03/2017   Incidental APPENDECTOMY;  Surgeon: [name], MD;  Location: [facility];  Service: General;;   BOWEL RESECTION  04/03/2017   Procedure: FOCAL SMALL BOWEL RESECTION;  Surgeon: [name], MD;  Location: [facility];  Service: General;;   COLON SURGERY     COLONOSCOPY WITH PROPOFOL N/A 06/12/2016   Procedure: COLONOSCOPY WITH PROPOFOL;  Surgeon: Manya Silvas, MD;  Location: [place];  Service: Endoscopy;  Laterality: N/A;   COLONOSCOPY WITH PROPOFOL N/A 05/07/2022   Procedure: COLONOSCOPY WITH PROPOFOL;  Surgeon: Jonathon Bellows, MD;  Location: [facility];   Service: Gastroenterology;  Laterality: N/A;   CYST EXCISION  1973   lower spine   FRACTURE SURGERY Left 2007   hip   HOLEP-LASER ENUCLEATION OF THE PROSTATE WITH MORCELLATION N/A 06/14/2018   Procedure: HOLEP-LASER ENUCLEATION OF THE PROSTATE WITH MORCELLATION;  Surgeon: Hollice Espy, MD;  Location: [facility];  Service: Urology;  Laterality: N/A;   LAPAROSCOPY N/A 04/01/2017   Procedure: LAPAROSCOPY DIAGNOSTIC;  Surgeon: [name], MD;  Location: [facility];  Service: General;  Laterality: N/A;   LAPAROTOMY N/A 04/03/2017   Procedure: EXPLORATORY LAPAROTOMY;  Surgeon: [name], MD;  Location: [facility];  Service: General;  Laterality: N/A;   LYSIS OF ADHESION  04/03/2017   Procedure: LYSIS OF ADHESION;  Surgeon: [name], MD;  Location: [facility];  Service: General;;   LYSIS OF ADHESION  12/15/2022   Procedure: LYSIS OF ADHESION;  Surgeon: Ronny Bacon, MD;  Location: [facility];  Service: General;;   VENTRAL HERNIA REPAIR N/A 12/13/2018   Procedure: HERNIA REPAIR VENTRAL ADULT WITH COMPONENT SEPARATOR MESH;  Surgeon: [name], MD;  Location: [facility];  Service: General;  Laterality: N/A;    Family History  Problem Relation Age of Onset   Hypertension Mother    Alzheimer's disease Mother    Hypertension Father    Cancer Father        lung   Hypertension Sister    Hypertension Brother    Hypertension Sister  Hypertension Sister    Hypertension Brother    Prostate cancer Neg Hx    Kidney cancer Neg Hx     Social History   Tobacco Use   Smoking status: Former    Packs/day: 0.50    Years: 10.00    Total pack years: 5.00    Types: Cigarettes    Quit date: 08/26/1985    Years since quitting: 37.3   Smokeless tobacco: Never   Tobacco comments:    smoking cessation materials not required  Vaping Use   Vaping Use: Never used  Substance Use Topics   Alcohol use: No   Drug use: No    Prior to Admission medications   Medication Sig  Start Date End Date Taking? Authorizing Provider  acetaminophen (TYLENOL) 500 MG tablet Take 1,000 mg by mouth every 6 (six) hours as needed.   Yes [provider]  aspirin 81 MG EC tablet Take 81 mg by mouth daily. Swallow whole.   Yes [provider]  Multiple Vitamins-Minerals (ONE-A-DAY MENS VITACRAVES PO) Take 1 tablet by mouth daily.    Yes [provider]  atorvastatin (LIPITOR) 20 MG tablet TAKE 1 TABLET BY MOUTH AT  BEDTIME 10/07/22   Jon Billings, NP  Docusate Calcium (STOOL SOFTENER PO) Take 1 capsule by mouth in the morning and at bedtime.    [provider]  HYDROcodone-acetaminophen (NORCO/VICODIN) 5-325 MG tablet Take 1 tablet by mouth every 6 (six) hours as needed for moderate pain. Patient not taking: Reported on 12/20/2022 12/15/22   Ronny Bacon, MD  metoprolol succinate (TOPROL-XL) 50 MG 24 hr tablet TAKE 1 TABLET BY MOUTH ONCE  DAILY TAKE WITH OR IMMEDIATELY  FOLLOWING A MEAL 10/07/22   Jon Billings, NP  triamcinolone (KENALOG) 0.025 % cream daily. Patient not taking: Reported on 12/20/2022 11/07/21   [provider]  valsartan-hydrochlorothiazide (DIOVAN-HCT) 160-25 MG tablet TAKE 1 TABLET BY MOUTH DAILY 10/07/22   Jon Billings, NP    Current Facility-Administered Medications  Medication Dose Route Frequency Provider Last Rate Last Admin   [START ON 12/22/2022] 0.9 %  sodium chloride infusion   Intravenous Continuous Gonfa, Taye T, MD       acetaminophen (TYLENOL) tablet 650 mg  650 mg Oral Q6H PRN Ivor Costa, MD       atorvastatin (LIPITOR) tablet 20 mg  20 mg Oral QHS Ivor Costa, MD   20 mg at 12/20/22 2259   metoprolol succinate (TOPROL-XL) 24 hr tablet 50 mg  50 mg Oral Daily Ivor Costa, MD   50 mg at 12/20/22 2108   morphine (PF) 2 MG/ML injection 2 mg  2 mg Intravenous Q4H PRN Ivor Costa, MD       multivitamin with minerals tablet 1 tablet  1 tablet Oral Daily Ivor Costa, MD   1 tablet at 12/20/22 2109    ondansetron [facility]) injection 4 mg  4 mg Intravenous Q8H PRN Ivor Costa, MD   4 mg at 12/20/22 2107   oxyCODONE-acetaminophen (PERCOCET/ROXICET) 5-325 MG per tablet 1 tablet  1 tablet Oral Q4H PRN Ivor Costa, MD       pantoprazole (PROTONIX) injection 40 mg  40 mg Intravenous Huntley Dec, MD   40 mg at 12/20/22 2258   polyethylene glycol (MIRALAX / GLYCOLAX) packet 17 g  17 g Oral Daily PRN Ivor Costa, MD       senna-docusate (Senokot-S) tablet 1 tablet  1 tablet Oral BID Ivor Costa, MD   1 tablet at 12/20/22  2259   sucralfate (CARAFATE) tablet 1 g  1 g Oral TID WC & HS Ivor Costa, MD   1 g at 12/20/22 2259    Allergies as of 12/20/2022   (No Known Allergies)     Review of Systems:    All systems reviewed and negative except where noted in HPI.  Review of Systems  Constitutional:  Negative for chills and fever.       Physical Exam:  Vital signs in last 24 hours: Temp:  [97.7 F (36.5 C)-98.2 F (36.8 C)] 97.7 F (36.5 C) (02/25 0757) Pulse Rate:  [49-94] 57 (02/25 0757) Resp:  [16-20] 16 (02/25 0757) BP: (129-173)/(56-73) 153/64 (02/25 0757) SpO2:  [96 %-100 %] 99 % (02/25 0757) Weight:  [96.5 kg] 96.5 kg (02/24 1954)   General:   Pleasant in NAD Head:  Normocephalic and atraumatic. Eyes:   No icterus.   Conjunctiva pink. Mouth: Mucosa pink moist, no lesions. Neck:  Supple; no masses felt Lungs:  No respiratory distress Abdomen:   Flat, soft, nondistended, nontender Msk:  No clubbing or cyanosis. Strength 5/5. Symmetrical without gross deformities. Neurologic:  Alert and  oriented x4; No focal deficits Skin:  Warm, dry, pink without significant lesions or rashes. Psych:  Alert and cooperative. Normal affect.  LAB RESULTS: Recent Labs    12/20/22 1150 12/21/22 0450  WBC 10.1 8.1  HGB 14.1 11.2*  HCT 41.0 33.6*  PLT 269 185   BMET Recent Labs    12/20/22 1150 12/21/22 0450  NA 138 138  K 3.4* 3.7  CL 99 108  CO2 25 24  GLUCOSE 172* 102*  BUN 18 19   CREATININE 0.91 0.80  CALCIUM 9.2 8.2*   LFT Recent Labs    12/20/22 1150  PROT 7.4  ALBUMIN 3.4*  AST 28  ALT 24  ALKPHOS 73  BILITOT 1.4*   PT/INR Recent Labs    12/20/22 1150  LABPROT 15.1  INR 1.2    STUDIES: MR ABDOMEN MRCP W WO CONTAST  Result Date: 12/21/2022 CLINICAL DATA:  77 year old male with history of abdominal pain status post cholecystectomy. EXAM: MRI ABDOMEN WITHOUT AND WITH CONTRAST (INCLUDING MRCP) TECHNIQUE: Multiplanar multisequence MR imaging of the abdomen was performed both before and after the administration of intravenous contrast. Heavily T2-weighted images of the biliary and pancreatic ducts were obtained, and three-dimensional MRCP images were rendered by post processing. CONTRAST:  56m GADAVIST GADOBUTROL 1 MMOL/ML IV SOLN COMPARISON:  No prior abdominal MRI. CT of the abdomen and pelvis 12/20/2022. FINDINGS: Lower chest: Unremarkable. Hepatobiliary: No suspicious cystic or solid hepatic lesions. Status post cholecystectomy. No unexpected postoperative fluid collection in the gallbladder fossa. No intra or extrahepatic biliary ductal dilatation noted on MRCP images. Common bile duct measures up to 6 mm in the porta hepatis. However, there is a 6 x 3 mm filling defect (axial image 66 of series 14) in the distal common bile duct, concerning for choledocholithiasis. Pancreas: No pancreatic mass. Mild inflammatory changes are noted adjacent to the head of the pancreas in the pancreaticoduodenal groove. No other inflammatory changes are noted adjacent to the remainder of the pancreas. No pancreatic ductal dilatation noted on MRCP images. No peripancreatic fluid collections. Spleen:  Unremarkable. Adrenals/Urinary Tract: Multiple renal lesions bilaterally, most of which are T1 hypointense and T2 hyperintense without enhancement, compatible with simple cysts (Bosniak class 1, no imaging follow-up recommended), many of which are parapelvic in location. In addition,  in the left kidney there are 2 small  lesions which are T1 hyperintense and T2 isointense without definite enhancement, compatible with proteinaceous/hemorrhagic cysts (Bosniak class 2, no imaging follow-up recommended), largest of which measures 9 mm in the upper pole. No hydroureteronephrosis in the visualized portions of the abdomen. Bilateral adrenal glands are normal in appearance. Stomach/Bowel: In the region of the pancreatico duodenal groove there is a focal outpouching of the medial aspect of the second portion of the duodenal which demonstrates some rim enhancement best appreciated on axial image 44 of series 19, along with some surrounding inflammation. Whether this represents a duodenal ulcer or inflamed duodenal diverticulum is uncertain. More inferiorly there is a small duodenal diverticulum extending off the anterior aspect of the third portion of the duodenum, without surrounding inflammatory changes. Otherwise, unremarkable. Vascular/Lymphatic: No aneurysm identified in the visualized abdominal vasculature. No lymphadenopathy noted in the abdomen. Other: No significant volume of ascites noted in the visualized portions of the peritoneal cavity. Musculoskeletal: No aggressive appearing osseous lesions are noted in the visualized portions of the skeleton. IMPRESSION: 1. Status post cholecystectomy. No unexpected postoperative fluid collection in the gallbladder fossa. 2. Tiny filling defect in the distal common bile duct concerning for choledocholithiasis. However, there is no evidence of biliary tract obstruction at this time. 3. Focal outpouching off the medial aspect of the second portion of the duodenum with surrounding inflammatory changes concerning for either small duodenal ulcer or inflamed diverticulum. Electronically Signed   By: Vinnie Langton M.D.   On: 12/21/2022 07:03   MR 3D Recon At Scanner  Result Date: 12/21/2022 CLINICAL DATA:  77 year old male with history of abdominal pain  status post cholecystectomy. EXAM: MRI ABDOMEN WITHOUT AND WITH CONTRAST (INCLUDING MRCP) TECHNIQUE: Multiplanar multisequence MR imaging of the abdomen was performed both before and after the administration of intravenous contrast. Heavily T2-weighted images of the biliary and pancreatic ducts were obtained, and three-dimensional MRCP images were rendered by post processing. CONTRAST:  72m GADAVIST GADOBUTROL 1 MMOL/ML IV SOLN COMPARISON:  No prior abdominal MRI. CT of the abdomen and pelvis 12/20/2022. FINDINGS: Lower chest: Unremarkable. Hepatobiliary: No suspicious cystic or solid hepatic lesions. Status post cholecystectomy. No unexpected postoperative fluid collection in the gallbladder fossa. No intra or extrahepatic biliary ductal dilatation noted on MRCP images. Common bile duct measures up to 6 mm in the porta hepatis. However, there is a 6 x 3 mm filling defect (axial image 66 of series 14) in the distal common bile duct, concerning for choledocholithiasis. Pancreas: No pancreatic mass. Mild inflammatory changes are noted adjacent to the head of the pancreas in the pancreaticoduodenal groove. No other inflammatory changes are noted adjacent to the remainder of the pancreas. No pancreatic ductal dilatation noted on MRCP images. No peripancreatic fluid collections. Spleen:  Unremarkable. Adrenals/Urinary Tract: Multiple renal lesions bilaterally, most of which are T1 hypointense and T2 hyperintense without enhancement, compatible with simple cysts (Bosniak class 1, no imaging follow-up recommended), many of which are parapelvic in location. In addition, in the left kidney there are 2 small lesions which are T1 hyperintense and T2 isointense without definite enhancement, compatible with proteinaceous/hemorrhagic cysts (Bosniak class 2, no imaging follow-up recommended), largest of which measures 9 mm in the upper pole. No hydroureteronephrosis in the visualized portions of the abdomen. Bilateral adrenal  glands are normal in appearance. Stomach/Bowel: In the region of the pancreatico duodenal groove there is a focal outpouching of the medial aspect of the second portion of the duodenal which demonstrates some rim enhancement best appreciated on axial image 44  of series 19, along with some surrounding inflammation. Whether this represents a duodenal ulcer or inflamed duodenal diverticulum is uncertain. More inferiorly there is a small duodenal diverticulum extending off the anterior aspect of the third portion of the duodenum, without surrounding inflammatory changes. Otherwise, unremarkable. Vascular/Lymphatic: No aneurysm identified in the visualized abdominal vasculature. No lymphadenopathy noted in the abdomen. Other: No significant volume of ascites noted in the visualized portions of the peritoneal cavity. Musculoskeletal: No aggressive appearing osseous lesions are noted in the visualized portions of the skeleton. IMPRESSION: 1. Status post cholecystectomy. No unexpected postoperative fluid collection in the gallbladder fossa. 2. Tiny filling defect in the distal common bile duct concerning for choledocholithiasis. However, there is no evidence of biliary tract obstruction at this time. 3. Focal outpouching off the medial aspect of the second portion of the duodenum with surrounding inflammatory changes concerning for either small duodenal ulcer or inflamed diverticulum. Electronically Signed   By: Vinnie Langton M.D.   On: 12/21/2022 07:03   CT ABDOMEN PELVIS W CONTRAST  Result Date: 12/20/2022 CLINICAL DATA:  Abdominal pain. History of cholecystectomy on 12/15/2022 EXAM: CT ABDOMEN AND PELVIS WITH CONTRAST TECHNIQUE: Multidetector CT imaging of the abdomen and pelvis was performed using the standard protocol following bolus administration of intravenous contrast. RADIATION DOSE REDUCTION: This exam was performed according to the departmental dose-optimization program which includes automated exposure  control, adjustment of the mA and/or kV according to patient size and/or use of iterative reconstruction technique. CONTRAST:  162m OMNIPAQUE IOHEXOL 300 MG/ML  SOLN COMPARISON:  CT 12/01/2022 FINDINGS: Lower chest: No acute abnormality. Hepatobiliary: No focal liver abnormality is seen. Interval cholecystectomy with mild stranding and trace fluid in the gallbladder fossa. No organized fluid collection. No biliary dilatation. Pancreas: Unremarkable. No pancreatic ductal dilatation or surrounding inflammatory changes. Spleen: Normal in size without focal abnormality. Adrenals/Urinary Tract: Unremarkable adrenal glands. Kidneys enhance symmetrically without solid lesion, stone, or hydronephrosis. Ureters are nondilated. Urinary bladder appears unremarkable for the degree of distention. Stomach/Bowel: Stomach within normal limits. Mild thickening of the second portion of the duodenum with mild adjacent fat stranding. There are a few duodenal diverticula, largest measuring 4.7 cm in diameter along the second portion. No dilated loops of bowel. Scattered colonic diverticulosis. No focal colonic wall thickening or inflammatory changes. Appendix appears to be surgically absent. Vascular/Lymphatic: Aortic atherosclerosis. No enlarged abdominal or pelvic lymph nodes. Reproductive: Prostate is unremarkable. Other: Small volume of hyperdense free fluid within the dependent portion of the pelvis. No ascites. No organized abdominopelvic fluid collections. No pneumoperitoneum. Musculoskeletal: No new or acute bony findings. Prior left hip pinning. IMPRESSION: 1. Interval cholecystectomy with mild stranding and trace fluid in the gallbladder fossa. No organized fluid collection. 2. Mild thickening of the second portion of the duodenum with adjacent fat stranding. Findings may represent duodenitis versus peptic ulcer disease. There are a few duodenal diverticula, largest measuring 4.7 cm in diameter along the second portion. 3.  Small volume of hyperdense free fluid within the dependent portion of the pelvis, likely representing a small amount of hemoperitoneum related to the recent intra-abdominal surgery. No organized abdominopelvic fluid collection or abscess. 4. Colonic diverticulosis without evidence of acute diverticulitis. 5. Aortic atherosclerosis (ICD10-I70.0). Electronically Signed   By: NDavina PokeD.O.   On: 12/20/2022 14:28       Impression / Plan:   77y/o gentleman with recent cholecystectomy and now with choledocholithiasis  - will tentatively plan for ERCP tomorrow but will need to discuss with  performing physician in the morning - continue PPI - hold any heparin products - NPO at midnight  Please call with any questions or concerns  Raylene Miyamoto MD, MPH [place]

## 2022-12-21 NOTE — Progress Notes (Signed)
PROGRESS NOTE  Drew Mcmahon [id number] DOB: Dec 11, 1945   PCP: Jon Billings, NP  Patient is from: Home  DOA: 12/20/2022 LOS: 0  Chief complaints Chief Complaint  Patient presents with   Emesis     Brief Narrative / Interim history: 77 year old M with PMH of HTN, HLD and recent laparoscopic cholecystectomy on 2/19 presenting with worsening abdominal pain, nausea and vomiting, and admitted for intractable abdominal pain, nausea and vomiting.  Vital stable.  Labs without significant finding.  CT concerning for duodenitis versus PUD.  GI consulted and recommended MRCP that is concerning for choledocholithiasis and duodenal ulcer versus inflammation of duodenal diverticulum.  Patient plan for ERCP on 2/26.   Subjective: Seen and examined earlier this morning.  No major events overnight of this morning.  Feels better today.  He rates pain 2/10.  He reports diarrhea.  He denies melena or hematochezia.  Patient's wife at bedside.  Objective: Vitals:   12/20/22 1954 12/20/22 2057 12/21/22 0519 12/21/22 0757  BP:  (!) 153/68 (!) 144/67 (!) 153/64  Pulse:  63 (!) 49 (!) 57  Resp:  20  16  Temp:  98.1 F (36.7 C) 97.7 F (36.5 C) 97.7 F (36.5 C)  TempSrc:   Oral Oral  SpO2:  100% 99% 99%  Weight: 96.5 kg     Height: 6' 0.01" (1.829 m)       Examination:  GENERAL: No apparent distress.  Nontoxic. HEENT: MMM.  Vision and hearing grossly intact.  NECK: Supple.  No apparent JVD.  RESP:  No IWOB.  Fair aeration bilaterally. CVS:  RRR. Heart sounds normal.  ABD/GI/GU: BS+. Abd soft.  Mild diffuse tenderness mostly over recent laparoscopic wounds.  No rebound. MSK/EXT:  Moves extremities. No apparent deformity. No edema.  SKIN: Recent laparoscopic wounds healing. NEURO: Awake, alert and oriented appropriately.  No apparent focal neuro deficit. PSYCH: Calm. Normal affect.   Procedures:  None  Microbiology summarized: None  Assessment and plan: Principal Problem:    Abdominal pain Active Problems:   Duodenitis   Hypokalemia   Hyperlipidemia   Hypertension  Abdominal pain/nausea/vomiting: Imaging concerning for choledocholithiasis and possible duodenal ulcer.  Patient had recent laparoscopic cholecystectomy.  No signs of biliary leak on MRCP.  LFT and lipase within normal.  GI symptoms improved.  Exam reassuring. -Continue IV PPI and Carafate for duodenitis/duodenal ulcer. -Tentative plan for ERCP by GI on 2/26 -Clear liquid diet today and n.p.o. after midnight  Essential hypertension: BP slightly elevated. -Continue home Toprol-XL -Resume home Diovan and hold HCTZ  Normocytic anemia: No evidence of bleeding.  Drop in Hgb likely dilutional. Recent Labs    12/01/22 1402 12/09/22 0911 12/20/22 1150 12/21/22 0450  HGB 15.8 15.0 14.1 11.2*  -Check anemia panel -Continue monitoring   Hypokalemia: -Monitor replenish as appropriate   Hyperlipidemia -Lipitor    Body mass index is 28.85 kg/m.          DVT prophylaxis:  SCDs Start: 12/20/22 1657  Code Status: Full code Family Communication: Updated patient's wife at bedside Level of care: Med-Surg Status is: Observation The patient will require care spanning > 2 midnights and should be moved to inpatient because: Abdominal pain, nausea and vomiting and possible choledocholithiasis   Final disposition: Home once medically cleared Consultants:  GI  35 minutes with more than 50% spent in reviewing records, counseling patient/family and coordinating care.   Sch Meds:  Scheduled Meds:  atorvastatin  20 mg Oral QHS   irbesartan  150  mg Oral Daily   metoprolol succinate  50 mg Oral Daily   multivitamin with minerals  1 tablet Oral Daily   pantoprazole (PROTONIX) IV  40 mg Intravenous Q12H   senna-docusate  1 tablet Oral BID   sucralfate  1 g Oral TID WC & HS   Continuous Infusions:  [START ON 12/22/2022] sodium chloride     PRN Meds:.acetaminophen, morphine injection,  ondansetron (ZOFRAN) IV, oxyCODONE-acetaminophen, polyethylene glycol  Antimicrobials: Anti-infectives (From admission, onward)    None        I have personally reviewed the following labs and images: CBC: Recent Labs  Lab 12/20/22 1150 12/21/22 0450  WBC 10.1 8.1  HGB 14.1 11.2*  HCT 41.0 33.6*  MCV 88.9 90.6  PLT 269 185   BMP &GFR Recent Labs  Lab 12/20/22 1150 12/21/22 0450  NA 138 138  K 3.4* 3.7  CL 99 108  CO2 25 24  GLUCOSE 172* 102*  BUN 18 19  CREATININE 0.91 0.80  CALCIUM 9.2 8.2*  MG 2.0  --    Estimated Creatinine Clearance: 94.7 mL/min (by C-G formula based on SCr of 0.8 mg/dL). Liver & Pancreas: Recent Labs  Lab 12/20/22 1150  AST 28  ALT 24  ALKPHOS 73  BILITOT 1.4*  PROT 7.4  ALBUMIN 3.4*   Recent Labs  Lab 12/20/22 1150  LIPASE 27   No results for input(s): "AMMONIA" in the last 168 hours. Diabetic: No results for input(s): "HGBA1C" in the last 72 hours. Recent Labs  Lab 12/21/22 0803  GLUCAP 102*   Cardiac Enzymes: No results for input(s): "CKTOTAL", "CKMB", "CKMBINDEX", "TROPONINI" in the last 168 hours. No results for input(s): "PROBNP" in the last 8760 hours. Coagulation Profile: Recent Labs  Lab 12/20/22 1150  INR 1.2   Thyroid Function Tests: No results for input(s): "TSH", "T4TOTAL", "FREET4", "T3FREE", "THYROIDAB" in the last 72 hours. Lipid Profile: No results for input(s): "CHOL", "HDL", "LDLCALC", "TRIG", "CHOLHDL", "LDLDIRECT" in the last 72 hours. Anemia Panel: No results for input(s): "VITAMINB12", "FOLATE", "FERRITIN", "TIBC", "IRON", "RETICCTPCT" in the last 72 hours. Urine analysis:    Component Value Date/Time   COLORURINE AMBER (A) 12/20/2022 1150   APPEARANCEUR CLEAR (A) 12/20/2022 1150   APPEARANCEUR Clear 12/09/2022 0910   LABSPEC 1.024 12/20/2022 1150   PHURINE 6.0 12/20/2022 1150   GLUCOSEU NEGATIVE 12/20/2022 1150   HGBUR NEGATIVE 12/20/2022 1150   BILIRUBINUR NEGATIVE 12/20/2022 1150    BILIRUBINUR Negative 12/09/2022 0910   KETONESUR 20 (A) 12/20/2022 1150   PROTEINUR 30 (A) 12/20/2022 1150   NITRITE NEGATIVE 12/20/2022 1150   LEUKOCYTESUR NEGATIVE 12/20/2022 1150   Sepsis Labs: Invalid input(s): "PROCALCITONIN", "LACTICIDVEN"  Microbiology: No results found for this or any previous visit (from the past 240 hour(s)).  Radiology Studies: MR ABDOMEN MRCP W WO CONTAST  Result Date: 12/21/2022 CLINICAL DATA:  77 year old male with history of abdominal pain status post cholecystectomy. EXAM: MRI ABDOMEN WITHOUT AND WITH CONTRAST (INCLUDING MRCP) TECHNIQUE: Multiplanar multisequence MR imaging of the abdomen was performed both before and after the administration of intravenous contrast. Heavily T2-weighted images of the biliary and pancreatic ducts were obtained, and three-dimensional MRCP images were rendered by post processing. CONTRAST:  53m GADAVIST GADOBUTROL 1 MMOL/ML IV SOLN COMPARISON:  No prior abdominal MRI. CT of the abdomen and pelvis 12/20/2022. FINDINGS: Lower chest: Unremarkable. Hepatobiliary: No suspicious cystic or solid hepatic lesions. Status post cholecystectomy. No unexpected postoperative fluid collection in the gallbladder fossa. No intra or extrahepatic biliary ductal dilatation  noted on MRCP images. Common bile duct measures up to 6 mm in the porta hepatis. However, there is a 6 x 3 mm filling defect (axial image 66 of series 14) in the distal common bile duct, concerning for choledocholithiasis. Pancreas: No pancreatic mass. Mild inflammatory changes are noted adjacent to the head of the pancreas in the pancreaticoduodenal groove. No other inflammatory changes are noted adjacent to the remainder of the pancreas. No pancreatic ductal dilatation noted on MRCP images. No peripancreatic fluid collections. Spleen:  Unremarkable. Adrenals/Urinary Tract: Multiple renal lesions bilaterally, most of which are T1 hypointense and T2 hyperintense without enhancement,  compatible with simple cysts (Bosniak class 1, no imaging follow-up recommended), many of which are parapelvic in location. In addition, in the left kidney there are 2 small lesions which are T1 hyperintense and T2 isointense without definite enhancement, compatible with proteinaceous/hemorrhagic cysts (Bosniak class 2, no imaging follow-up recommended), largest of which measures 9 mm in the upper pole. No hydroureteronephrosis in the visualized portions of the abdomen. Bilateral adrenal glands are normal in appearance. Stomach/Bowel: In the region of the pancreatico duodenal groove there is a focal outpouching of the medial aspect of the second portion of the duodenal which demonstrates some rim enhancement best appreciated on axial image 44 of series 19, along with some surrounding inflammation. Whether this represents a duodenal ulcer or inflamed duodenal diverticulum is uncertain. More inferiorly there is a small duodenal diverticulum extending off the anterior aspect of the third portion of the duodenum, without surrounding inflammatory changes. Otherwise, unremarkable. Vascular/Lymphatic: No aneurysm identified in the visualized abdominal vasculature. No lymphadenopathy noted in the abdomen. Other: No significant volume of ascites noted in the visualized portions of the peritoneal cavity. Musculoskeletal: No aggressive appearing osseous lesions are noted in the visualized portions of the skeleton. IMPRESSION: 1. Status post cholecystectomy. No unexpected postoperative fluid collection in the gallbladder fossa. 2. Tiny filling defect in the distal common bile duct concerning for choledocholithiasis. However, there is no evidence of biliary tract obstruction at this time. 3. Focal outpouching off the medial aspect of the second portion of the duodenum with surrounding inflammatory changes concerning for either small duodenal ulcer or inflamed diverticulum. Electronically Signed   By: Vinnie Langton M.D.   On:  12/21/2022 07:03   MR 3D Recon At Scanner  Result Date: 12/21/2022 CLINICAL DATA:  77 year old male with history of abdominal pain status post cholecystectomy. EXAM: MRI ABDOMEN WITHOUT AND WITH CONTRAST (INCLUDING MRCP) TECHNIQUE: Multiplanar multisequence MR imaging of the abdomen was performed both before and after the administration of intravenous contrast. Heavily T2-weighted images of the biliary and pancreatic ducts were obtained, and three-dimensional MRCP images were rendered by post processing. CONTRAST:  78m GADAVIST GADOBUTROL 1 MMOL/ML IV SOLN COMPARISON:  No prior abdominal MRI. CT of the abdomen and pelvis 12/20/2022. FINDINGS: Lower chest: Unremarkable. Hepatobiliary: No suspicious cystic or solid hepatic lesions. Status post cholecystectomy. No unexpected postoperative fluid collection in the gallbladder fossa. No intra or extrahepatic biliary ductal dilatation noted on MRCP images. Common bile duct measures up to 6 mm in the porta hepatis. However, there is a 6 x 3 mm filling defect (axial image 66 of series 14) in the distal common bile duct, concerning for choledocholithiasis. Pancreas: No pancreatic mass. Mild inflammatory changes are noted adjacent to the head of the pancreas in the pancreaticoduodenal groove. No other inflammatory changes are noted adjacent to the remainder of the pancreas. No pancreatic ductal dilatation noted on MRCP images. No peripancreatic  fluid collections. Spleen:  Unremarkable. Adrenals/Urinary Tract: Multiple renal lesions bilaterally, most of which are T1 hypointense and T2 hyperintense without enhancement, compatible with simple cysts (Bosniak class 1, no imaging follow-up recommended), many of which are parapelvic in location. In addition, in the left kidney there are 2 small lesions which are T1 hyperintense and T2 isointense without definite enhancement, compatible with proteinaceous/hemorrhagic cysts (Bosniak class 2, no imaging follow-up recommended),  largest of which measures 9 mm in the upper pole. No hydroureteronephrosis in the visualized portions of the abdomen. Bilateral adrenal glands are normal in appearance. Stomach/Bowel: In the region of the pancreatico duodenal groove there is a focal outpouching of the medial aspect of the second portion of the duodenal which demonstrates some rim enhancement best appreciated on axial image 44 of series 19, along with some surrounding inflammation. Whether this represents a duodenal ulcer or inflamed duodenal diverticulum is uncertain. More inferiorly there is a small duodenal diverticulum extending off the anterior aspect of the third portion of the duodenum, without surrounding inflammatory changes. Otherwise, unremarkable. Vascular/Lymphatic: No aneurysm identified in the visualized abdominal vasculature. No lymphadenopathy noted in the abdomen. Other: No significant volume of ascites noted in the visualized portions of the peritoneal cavity. Musculoskeletal: No aggressive appearing osseous lesions are noted in the visualized portions of the skeleton. IMPRESSION: 1. Status post cholecystectomy. No unexpected postoperative fluid collection in the gallbladder fossa. 2. Tiny filling defect in the distal common bile duct concerning for choledocholithiasis. However, there is no evidence of biliary tract obstruction at this time. 3. Focal outpouching off the medial aspect of the second portion of the duodenum with surrounding inflammatory changes concerning for either small duodenal ulcer or inflamed diverticulum. Electronically Signed   By: Vinnie Langton M.D.   On: 12/21/2022 07:03      Phillip Sandler T. Carmichael  If 7PM-7AM, please contact night-coverage [email or web address] 12/21/2022, 2:06 PM

## 2022-12-22 ENCOUNTER — Inpatient Hospital Stay: Payer: Medicare Other | Admitting: Registered Nurse

## 2022-12-22 ENCOUNTER — Inpatient Hospital Stay: Payer: Medicare Other

## 2022-12-22 ENCOUNTER — Encounter
Admission: EM | Disposition: A | Discharge status: Home / Self Care | Payer: Self-pay | Source: Home / Self Care | Attending: Student

## 2022-12-22 ENCOUNTER — Encounter: Payer: Self-pay | Admitting: Student

## 2022-12-22 DIAGNOSIS — E785 Hyperlipidemia, unspecified: Secondary | ICD-10-CM | POA: Diagnosis not present

## 2022-12-22 DIAGNOSIS — R109 Unspecified abdominal pain: Secondary | ICD-10-CM | POA: Diagnosis not present

## 2022-12-22 DIAGNOSIS — K2980 Duodenitis without bleeding: Secondary | ICD-10-CM | POA: Diagnosis not present

## 2022-12-22 DIAGNOSIS — K8050 Calculus of bile duct without cholangitis or cholecystitis without obstruction: Secondary | ICD-10-CM | POA: Diagnosis not present

## 2022-12-22 DIAGNOSIS — E876 Hypokalemia: Secondary | ICD-10-CM | POA: Diagnosis not present

## 2022-12-22 HISTORY — PX: ERCP: SHX5425

## 2022-12-22 LAB — CBC
HCT: 34.7 % — ABNORMAL LOW (ref 39.0–52.0)
Hemoglobin: 11.4 g/dL — ABNORMAL LOW (ref 13.0–17.0)
MCH: 29.8 pg (ref 26.0–34.0)
MCHC: 32.9 g/dL (ref 30.0–36.0)
MCV: 90.6 fL (ref 80.0–100.0)
Platelets: 179 10*3/uL (ref 150–400)
RBC: 3.83 MIL/uL — ABNORMAL LOW (ref 4.22–5.81)
RDW: 12.7 % (ref 11.5–15.5)
WBC: 8 10*3/uL (ref 4.0–10.5)
nRBC: 0 % (ref 0.0–0.2)

## 2022-12-22 LAB — COMPREHENSIVE METABOLIC PANEL
ALT: 19 U/L (ref 0–44)
AST: 18 U/L (ref 15–41)
Albumin: 2.5 g/dL — ABNORMAL LOW (ref 3.5–5.0)
Alkaline Phosphatase: 61 U/L (ref 38–126)
Anion gap: 6 (ref 5–15)
BUN: 12 mg/dL (ref 8–23)
CO2: 25 mmol/L (ref 22–32)
Calcium: 8.5 mg/dL — ABNORMAL LOW (ref 8.9–10.3)
Chloride: 107 mmol/L (ref 98–111)
Creatinine, Ser: 0.77 mg/dL (ref 0.61–1.24)
GFR, Estimated: >60 mL/min (ref >60)
Glucose, Bld: 119 mg/dL — ABNORMAL HIGH (ref 70–99)
Potassium: 3.4 mmol/L — ABNORMAL LOW (ref 3.5–5.1)
Sodium: 138 mmol/L (ref 135–145)
Total Bilirubin: 0.9 mg/dL (ref 0.3–1.2)
Total Protein: 5.5 g/dL — ABNORMAL LOW (ref 6.5–8.1)

## 2022-12-22 LAB — PHOSPHORUS: Phosphorus: 2.9 mg/dL (ref 2.5–4.6)

## 2022-12-22 LAB — MAGNESIUM: Magnesium: 1.9 mg/dL (ref 1.7–2.4)

## 2022-12-22 LAB — RETICULOCYTES
Immature Retic Fract: 19.6 % — ABNORMAL HIGH (ref 2.3–15.9)
RBC.: 3.82 MIL/uL — ABNORMAL LOW (ref 4.22–5.81)
Retic Count, Absolute: 71.8 10*3/uL (ref 19.0–186.0)
Retic Ct Pct: 1.9 % (ref 0.4–3.1)

## 2022-12-22 LAB — IRON AND TIBC
Iron: 45 ug/dL (ref 45–182)
Saturation Ratios: 26 % (ref 17.9–39.5)
TIBC: 175 ug/dL — ABNORMAL LOW (ref 250–450)
UIBC: 130 ug/dL

## 2022-12-22 LAB — VITAMIN B12: Vitamin B-12: 298 pg/mL (ref 180–914)

## 2022-12-22 LAB — GLUCOSE, CAPILLARY: Glucose-Capillary: 107 mg/dL — ABNORMAL HIGH (ref 70–99)

## 2022-12-22 LAB — FOLATE: Folate: 23 ng/mL (ref >5.9)

## 2022-12-22 LAB — FERRITIN: Ferritin: 220 ng/mL (ref 24–336)

## 2022-12-22 SURGERY — ERCP, WITH INTERVENTION IF INDICATED
Anesthesia: General

## 2022-12-22 MED ORDER — LIDOCAINE HCL (CARDIAC) PF 100 MG/5ML IV SOSY
PREFILLED_SYRINGE | INTRAVENOUS | Status: DC | PRN
  Administered 2022-12-22: 80 mg via INTRAVENOUS

## 2022-12-22 MED ORDER — ACETAMINOPHEN 325 MG PO TABS: 650.0000 mg | ORAL_TABLET | Freq: Four times a day (QID) | ORAL | Status: DC | PRN

## 2022-12-22 MED ORDER — INDOMETHACIN 50 MG RE SUPP
RECTAL | Status: AC
  Filled 2022-12-22: qty 2

## 2022-12-22 MED ORDER — PROPOFOL 500 MG/50ML IV EMUL
INTRAVENOUS | Status: DC | PRN
  Administered 2022-12-22: 140 ug/kg/min via INTRAVENOUS

## 2022-12-22 MED ORDER — IOHEXOL 300 MG/ML  SOLN
100.0000 mL | Freq: Once | INTRAMUSCULAR | Status: AC | PRN
  Administered 2022-12-22: 100 mL via INTRAVENOUS

## 2022-12-22 MED ORDER — POTASSIUM CHLORIDE IN NACL 20-0.9 MEQ/L-% IV SOLN
INTRAVENOUS | Status: DC
  Filled 2022-12-22 (×9): qty 1000

## 2022-12-22 MED ORDER — POTASSIUM CHLORIDE CRYS ER 20 MEQ PO TBCR: 40.0000 meq | EXTENDED_RELEASE_TABLET | ORAL | Status: DC

## 2022-12-22 MED ORDER — PANTOPRAZOLE INFUSION (NEW) - SIMPLE MED
8.0000 mg/h | INTRAVENOUS | Status: AC
  Administered 2022-12-22 – 2022-12-25 (×6): 8 mg/h via INTRAVENOUS
  Filled 2022-12-22 (×5): qty 100

## 2022-12-22 MED ORDER — PROPOFOL 10 MG/ML IV BOLUS
INTRAVENOUS | Status: DC | PRN
  Administered 2022-12-22: 60 mg via INTRAVENOUS
  Administered 2022-12-22 (×2): 30 mg via INTRAVENOUS
  Administered 2022-12-22: 120 mg via INTRAVENOUS
  Administered 2022-12-22: 30 mg via INTRAVENOUS

## 2022-12-22 MED ORDER — PROPOFOL 10 MG/ML IV BOLUS
INTRAVENOUS | Status: AC
  Filled 2022-12-22: qty 40

## 2022-12-22 MED ORDER — ACETAMINOPHEN 650 MG RE SUPP: 650.0000 mg | Freq: Four times a day (QID) | RECTAL | Status: DC | PRN

## 2022-12-22 MED ORDER — IOHEXOL 9 MG/ML PO SOLN
500.0000 mL | ORAL | Status: AC
  Administered 2022-12-22 (×2): 500 mL via ORAL

## 2022-12-22 MED ORDER — LACTATED RINGERS IV SOLN: INTRAVENOUS | Status: DC

## 2022-12-22 MED ORDER — MORPHINE SULFATE (PF) 2 MG/ML IV SOLN: 1.0000 mg | INTRAVENOUS | Status: DC | PRN

## 2022-12-22 MED ORDER — DICLOFENAC SUPPOSITORY 100 MG: 100.0000 mg | Freq: Once | RECTAL | Status: DC

## 2022-12-22 MED ORDER — GLYCOPYRROLATE 0.2 MG/ML IJ SOLN
INTRAMUSCULAR | Status: DC | PRN
  Administered 2022-12-22: .2 mg via INTRAVENOUS

## 2022-12-22 MED ORDER — PIPERACILLIN-TAZOBACTAM 3.375 G IVPB
3.3750 g | Freq: Three times a day (TID) | INTRAVENOUS | Status: DC
  Administered 2022-12-22 – 2022-12-26 (×11): 3.375 g via INTRAVENOUS
  Filled 2022-12-22 (×11): qty 50

## 2022-12-22 MED ORDER — POTASSIUM CHLORIDE 10 MEQ/100ML IV SOLN
10.0000 meq | INTRAVENOUS | Status: AC
  Administered 2022-12-22 (×4): 10 meq via INTRAVENOUS
  Filled 2022-12-22 (×4): qty 100

## 2022-12-22 MED ORDER — DICLOFENAC SUPPOSITORY 100 MG
RECTAL | Status: AC
  Filled 2022-12-22: qty 1

## 2022-12-22 NOTE — Progress Notes (Signed)
PROGRESS NOTE  Drew Mcmahon [id number] DOB: Nov 12, 1945   PCP: Jon Billings, NP  Patient is from: Home  DOA: 12/20/2022 LOS: 1  Chief complaints Chief Complaint  Patient presents with   Emesis     Brief Narrative / Interim history: 77 year old M with PMH of HTN, HLD and recent laparoscopic cholecystectomy on 2/19 presenting with worsening abdominal pain, nausea and vomiting, and admitted for intractable abdominal pain, nausea and vomiting.  Vital stable.  Labs without significant finding.  CT concerning for duodenitis versus PUD.  GI consulted and recommended MRCP that is concerning for choledocholithiasis and duodenal ulcer versus inflammation of duodenal diverticulum.  Patient plan for ERCP on 2/26.   Subjective: Seen and examined earlier this morning.  No major events overnight of this morning.  Reports improvement in his pain.  No nausea or vomiting.  Ambulating in the room.  Wife and son at bedside.  Objective: Vitals:   12/21/22 2108 12/22/22 0526 12/22/22 0700 12/22/22 0740  BP: 136/73 130/65  (!) 152/72  Pulse: (!) 52 (!) 51 60 (!) 47  Resp: '20 16  16  '$ Temp: 98.3 F (36.8 C) 97.9 F (36.6 C)  97.6 F (36.4 C)  TempSrc:  Oral    SpO2: 100% 97%  100%  Weight:      Height:        Examination:  GENERAL: No apparent distress.  Nontoxic. HEENT: MMM.  Vision and hearing grossly intact.  NECK: Supple.  No apparent JVD.  RESP:  No IWOB.  Fair aeration bilaterally. CVS:  RRR. Heart sounds normal.  ABD/GI/GU: BS+.  Mild diffuse tenderness over RUQ and laparoscopic wounds MSK/EXT:   No apparent deformity. Moves extremities. No edema.  SKIN: no apparent skin lesion or wound NEURO: Awake and alert. Oriented appropriately.  No apparent focal neuro deficit. PSYCH: Calm. Normal affect.   Procedures:  None  Microbiology summarized: None  Assessment and plan: Principal Problem:   Abdominal pain Active Problems:   Duodenitis   Hypokalemia    Hyperlipidemia   Hypertension  Abdominal pain/nausea/vomiting: Imaging concerning for choledocholithiasis and possible duodenal ulcer.  Patient had recent laparoscopic cholecystectomy.  No signs of biliary leak on MRCP.  LFT and lipase within normal.  GI symptoms improved.  Exam reassuring. -Continue IV PPI and Carafate for duodenitis/duodenal ulcer. -Plan for ERCP today, 2/26.  Essential hypertension: BP slightly elevated. -Continue home Toprol-XL and Diovan. -Continue holding HCTZ.  Normocytic anemia: No evidence of bleeding.  Initial drop likely dilutional.  No iron deficiency on anemia panel.  Now stable. Recent Labs    12/01/22 1402 12/09/22 0911 12/20/22 1150 12/21/22 0450 12/22/22 0443  HGB 15.8 15.0 14.1 11.2* 11.4*  -Continue monitoring   Hypokalemia: -Monitor replenish as appropriate   Hyperlipidemia -Lipitor    Body mass index is 28.85 kg/m.          DVT prophylaxis:  SCDs Start: 12/20/22 1657  Code Status: Full code Family Communication: Updated patient's wife and son at bedside Level of care: Med-Surg Status is: Inpatient The patient will remain inpatient because: Possible choledocholithiasis.   Final disposition: Home once medically cleared Consultants:  GI  35 minutes with more than 50% spent in reviewing records, counseling patient/family and coordinating care.   Sch Meds:  Scheduled Meds:  atorvastatin  20 mg Oral QHS   irbesartan  150 mg Oral Daily   metoprolol succinate  50 mg Oral Daily   multivitamin with minerals  1 tablet Oral Daily   pantoprazole (PROTONIX)  IV  40 mg Intravenous Q12H   senna-docusate  1 tablet Oral BID   sucralfate  1 g Oral TID WC & HS   Continuous Infusions:  0.9 % NaCl with KCl 20 mEq / L 75 mL/hr at 12/22/22 1140   potassium chloride 10 mEq (12/22/22 1258)   PRN Meds:.acetaminophen, morphine injection, ondansetron (ZOFRAN) IV, oxyCODONE-acetaminophen, polyethylene glycol  Antimicrobials: Anti-infectives  (From admission, onward)    None        I have personally reviewed the following labs and images: CBC: Recent Labs  Lab 12/20/22 1150 12/21/22 0450 12/22/22 0443  WBC 10.1 8.1 8.0  HGB 14.1 11.2* 11.4*  HCT 41.0 33.6* 34.7*  MCV 88.9 90.6 90.6  PLT 269 185 179   BMP &GFR Recent Labs  Lab 12/20/22 1150 12/21/22 0450 12/22/22 0443  NA 138 138 138  K 3.4* 3.7 3.4*  CL 99 108 107  CO2 '25 24 25  '$ GLUCOSE 172* 102* 119*  BUN '18 19 12  '$ CREATININE 0.91 0.80 0.77  CALCIUM 9.2 8.2* 8.5*  MG 2.0  --  1.9  PHOS  --   --  2.9   Estimated Creatinine Clearance: 94.7 mL/min (by C-G formula based on SCr of 0.77 mg/dL). Liver & Pancreas: Recent Labs  Lab 12/20/22 1150 12/22/22 0443  AST 28 18  ALT 24 19  ALKPHOS 73 61  BILITOT 1.4* 0.9  PROT 7.4 5.5*  ALBUMIN 3.4* 2.5*   Recent Labs  Lab 12/20/22 1150  LIPASE 27   No results for input(s): "AMMONIA" in the last 168 hours. Diabetic: No results for input(s): "HGBA1C" in the last 72 hours. Recent Labs  Lab 12/21/22 0803 12/22/22 0744  GLUCAP 102* 107*   Cardiac Enzymes: No results for input(s): "CKTOTAL", "CKMB", "CKMBINDEX", "TROPONINI" in the last 168 hours. No results for input(s): "PROBNP" in the last 8760 hours. Coagulation Profile: Recent Labs  Lab 12/20/22 1150  INR 1.2   Thyroid Function Tests: No results for input(s): "TSH", "T4TOTAL", "FREET4", "T3FREE", "THYROIDAB" in the last 72 hours. Lipid Profile: No results for input(s): "CHOL", "HDL", "LDLCALC", "TRIG", "CHOLHDL", "LDLDIRECT" in the last 72 hours. Anemia Panel: Recent Labs    12/22/22 0443  VITAMINB12 298  FOLATE 23.0  FERRITIN 220  TIBC 175*  IRON 45  RETICCTPCT 1.9   Urine analysis:    Component Value Date/Time   COLORURINE AMBER (A) 12/20/2022 1150   APPEARANCEUR CLEAR (A) 12/20/2022 1150   APPEARANCEUR Clear 12/09/2022 0910   LABSPEC 1.024 12/20/2022 1150   PHURINE 6.0 12/20/2022 1150   GLUCOSEU NEGATIVE 12/20/2022 1150    HGBUR NEGATIVE 12/20/2022 1150   BILIRUBINUR NEGATIVE 12/20/2022 1150   BILIRUBINUR Negative 12/09/2022 0910   KETONESUR 20 (A) 12/20/2022 1150   PROTEINUR 30 (A) 12/20/2022 1150   NITRITE NEGATIVE 12/20/2022 1150   LEUKOCYTESUR NEGATIVE 12/20/2022 1150   Sepsis Labs: Invalid input(s): "PROCALCITONIN", "LACTICIDVEN"  Microbiology: No results found for this or any previous visit (from the past 240 hour(s)).  Radiology Studies: No results found.    Annaya Bangert T. [place]  If 7PM-7AM, please contact night-coverage [email or web address] 12/22/2022, 1:50 PM

## 2022-12-22 NOTE — Op Note (Signed)
[facility] Gastroenterology Patient Name: Drew Mcmahon Procedure Date: 12/22/2022 12:11 PM MRN: [id number] Account #: [id number] Date of Birth: Dec 20, 1945 Admit Type: Outpatient Age: 77 Room: [facility] ENDO ROOM 4 Gender: Male Note Status: Finalized Instrument Name: Selinda Eon [id number] Procedure:             ERCP Indications:           Common bile duct stone(s) Providers:             Lucilla Lame MD, MD Medicines:             Propofol per Anesthesia Complications:         Tear Procedure:             Pre-Anesthesia Assessment:                        - Prior to the procedure, a History and Physical was                         performed, and patient medications and allergies were                         reviewed. The patient's tolerance of previous                         anesthesia was also reviewed. The risks and benefits                         of the procedure and the sedation options and risks                         were discussed with the patient. All questions were                         answered, and informed consent was obtained. Prior                         Anticoagulants: The patient has taken no anticoagulant                         or antiplatelet agents. ASA Grade Assessment: II - A                         patient with mild systemic disease. After reviewing                         the risks and benefits, the patient was deemed in                         satisfactory condition to undergo the procedure.                        After obtaining informed consent, the scope was passed                         under direct vision. Throughout the procedure, the                         patient's blood pressure, pulse, and  oxygen                         saturations were monitored continuously. The                         Duodenoscope was introduced through the mouth, The                         procedure was aborted. The scope was not inserted.                          bile ducts Medications were duodenum. The ERCP was                         technically difficult and complex due to Large                         duodenal bulb uler. The patient tolerated the                         procedure well. Findings:      The scout film was normal. A standard esophagogastroduodenoscopy scope       was used for the examination of the upper gastrointestinal tract. The       scope was passed under direct vision through the upper GI tract. LA       Grade C (one or more mucosal breaks continuous between tops of 2 or more       mucosal folds, less than 75% circumference) esophagitis with no bleeding       was found in the lower third of the esophagus. The entire examined       stomach was normal. One non-bleeding cratered duodenal ulcer with no       stigmata of bleeding was found in the duodenal bulb. While trying to       pass the scope beyond the ulcer a mucosal tear was seen and 3 clips were       placed and the procedure was aborted. Impression:            - LA Grade C reflux esophagitis with no bleeding.                        - Normal stomach.                        - Non-bleeding duodenal ulcer with no stigmata of                         bleeding. Recommendation:        - Return patient to hospital ward for ongoing care.                        - Refer to a surgeon already notified. Procedure Code(s):     --- Professional ---                        [id number], 52, Endoscopic retrograde                         cholangiopancreatography (ERCP); diagnostic, including  collection of specimen(s) by brushing or washing, when                         performed (separate procedure) Diagnosis Code(s):     --- Professional ---                        K80.50, Calculus of bile duct without cholangitis or                         cholecystitis without obstruction                        K21.00, Gastro-esophageal reflux disease with                          esophagitis, without bleeding                        K26.9, Duodenal ulcer, unspecified as acute or                         chronic, without hemorrhage or perforation CPT copyright 2022 [employer]. All rights reserved. The codes documented in this report are preliminary and upon coder review may  be revised to meet current compliance requirements. Lucilla Lame MD, MD 12/22/2022 5:24:16 PM This report has been signed electronically. Number of Addenda: 0 Note Initiated On: 12/22/2022 12:11 PM Estimated Blood Loss:  Estimated blood loss was minimal.      [facility]

## 2022-12-22 NOTE — Transfer of Care (Signed)
Immediate Anesthesia Transfer of Care Note  Patient: FERMIN HOSSEINI  Procedure(s) Performed: ENDOSCOPIC RETROGRADE CHOLANGIOPANCREATOGRAPHY (ERCP)  Patient Location: PACU and Endoscopy Unit  Anesthesia Type:General  Level of Consciousness: drowsy  Airway & Oxygen Therapy: Patient Spontanous Breathing  Post-op Assessment: Report given to RN and Post -op Vital signs reviewed and stable  Post vital signs: Reviewed and stable  Last Vitals:  Vitals Value Taken Time  BP 124/68 12/22/22 1714  Temp 35.8 C 12/22/22 1714  Pulse 64 12/22/22 1717  Resp 17 12/22/22 1717  SpO2 93 % 12/22/22 1717  Vitals shown include unvalidated device data.  Last Pain:  Vitals:   12/22/22 1714  TempSrc: Temporal  PainSc: Asleep         Complications: No notable events documented.

## 2022-12-22 NOTE — Progress Notes (Signed)
       CROSS COVER NOTE  NAME: Drew Mcmahon MRN: [id number] DOB : 1946-03-02 ATTENDING PHYSICIAN: Mercy Riding, MD    Date of Service   12/22/2022   HPI/Events of Note   Call received from [facility] with results of STAT CT Abdomen Pelvis ordered by Gastroenterology, results below:  "Moderate amount of free intraperitoneal air in the right upper quadrant and right retroperitoneum. Findings are concerning for perforated duodenum. There is a questionable small amount of extravasated oral contrast of the third portion of the duodenal versus small duodenal diverticulum."  Interventions   Assessment/Plan: Dr Lysle Pearl with General surgery evaluated patient tonight. Recommends Protonix drip and Strict NPO. Meds transitioned to IV as able.      To reach the provider On-Call:   7AM- 7PM see care teams to locate the attending and reach out to them via [email or web address] [email or web address] 7PM-7AM contact night-coverage If you still have difficulty reaching the appropriate provider, please page the [facility] (Director on Call) for [facility] on amion for assistance  This document was prepared using Systems analyst and may include unintentional dictation errors.  Neomia Glass DNP, MBA, FNP-BC, PMHNP-BC Nurse Practitioner [facility] Pager [phone]

## 2022-12-22 NOTE — Anesthesia Preprocedure Evaluation (Addendum)
Anesthesia Evaluation  Patient identified by MRN, date of birth, ID band Patient awake    Reviewed: Allergy & Precautions, NPO status , Patient's Chart, lab work & pertinent test results  History of Anesthesia Complications (+) PONV and history of anesthetic complications  Airway Mallampati: III  TM Distance: >3 FB Neck ROM: full    Dental  (+) Dental Advidsory Given, Partial Upper   Pulmonary neg pulmonary ROS, former smoker (quit 1986)   Pulmonary exam normal        Cardiovascular hypertension, negative cardio ROS Normal cardiovascular exam  ECG 12/01/22: NSR; nonspecific ST and T abnormality   Neuro/Psych negative neurological ROS  negative psych ROS   GI/Hepatic negative GI ROS, Neg liver ROS,,,S/p laparoscopic cholecystectomy on 12/15/22   Endo/Other  negative endocrine ROS  Prediabetes   Renal/GU negative Renal ROS  negative genitourinary   Musculoskeletal  (+) Arthritis ,    Abdominal   Peds  Hematology negative hematology ROS (+)   Anesthesia Other Findings Past Medical History: No date: Aortic calcification (HCC) No date: Arthritis No date: Elevated lipids 2007: Hip fracture ([place]) 07/04/2018: History of hip fracture No date: Hyperlipidemia No date: Hypertension 07/19/2018: Osteopenia     Comment:  Sept 2019; next scan Sept 2021 12/24/2018: Postoperative nausea and vomiting No date: Pre-diabetes  Past Surgical History: 04/03/2017: APPENDECTOMY     Comment:  Incidental APPENDECTOMY;  Surgeon: [name],               MD;  Location: [facility];  Service: General;; 04/03/2017: BOWEL RESECTION     Comment:  Procedure: FOCAL SMALL BOWEL RESECTION;  Surgeon:               [name], MD;  Location: [facility];  Service:               General;; No date: COLON SURGERY 06/12/2016: COLONOSCOPY WITH PROPOFOL; N/A     Comment:  Procedure: COLONOSCOPY WITH PROPOFOL;  Surgeon: Manya Silvas, MD;  Location: [place];  Service:               Endoscopy;  Laterality: N/A; 05/07/2022: COLONOSCOPY WITH PROPOFOL; N/A     Comment:  Procedure: COLONOSCOPY WITH PROPOFOL;  Surgeon: Jonathon Bellows, MD;  Location: [facility];  Service:               Gastroenterology;  Laterality: N/A; 1973: CYST EXCISION     Comment:  lower spine 2007: FRACTURE SURGERY; Left     Comment:  hip 06/14/2018: HOLEP-LASER ENUCLEATION OF THE PROSTATE WITH  MORCELLATION; N/A     Comment:  Procedure: HOLEP-LASER ENUCLEATION OF THE PROSTATE WITH               MORCELLATION;  Surgeon: Hollice Espy, MD;  Location:               [facility];  Service: Urology;  Laterality: N/A; 04/01/2017: LAPAROSCOPY; N/A     Comment:  Procedure: LAPAROSCOPY DIAGNOSTIC;  Surgeon: [name], MD;  Location: [facility];  Service: General;                Laterality: N/A; 04/03/2017: LAPAROTOMY; N/A     Comment:  Procedure: EXPLORATORY LAPAROTOMY;  Surgeon:  [name], MD;  Location: [facility];  Service: General;                Laterality: N/A; 04/03/2017: LYSIS OF ADHESION     Comment:  Procedure: LYSIS OF ADHESION;  Surgeon: [name], MD;  Location: [facility];  Service: General;; 12/15/2022: LYSIS OF ADHESION     Comment:  Procedure: LYSIS OF ADHESION;  Surgeon: Ronny Bacon, MD;  Location: [facility];  Service: General;; 12/13/2018: VENTRAL HERNIA REPAIR; N/A     Comment:  Procedure: HERNIA REPAIR VENTRAL ADULT WITH COMPONENT               SEPARATOR MESH;  Surgeon: [name], MD;                Location: [facility];  Service: General;  Laterality: N/A;  BMI    Body Mass Index: 27.80 kg/m      Reproductive/Obstetrics negative OB ROS                             Anesthesia Physical Anesthesia Plan  ASA: 2  Anesthesia Plan: General   Post-op Pain Management: Minimal or no pain  anticipated   Induction: Intravenous  PONV Risk Score and Plan: 3 and Propofol infusion, TIVA and Ondansetron  Airway Management Planned: Nasal Cannula  Additional Equipment: None  Intra-op Plan:   Post-operative Plan:   Informed Consent: I have reviewed the patients History and Physical, chart, labs and discussed the procedure including the risks, benefits and alternatives for the proposed anesthesia with the patient or authorized representative who has indicated his/her understanding and acceptance.     Dental advisory given  Plan Discussed with: CRNA and Surgeon  Anesthesia Plan Comments: (Discussed risks of anesthesia with patient, including possibility of difficulty with spontaneous ventilation under anesthesia necessitating airway intervention, PONV, and rare risks such as cardiac or respiratory or neurological events, and allergic reactions. Discussed the role of CRNA in patient's perioperative care. Patient understands.)        Anesthesia Quick Evaluation

## 2022-12-22 NOTE — Progress Notes (Signed)
Patient returned from Endo department

## 2022-12-22 NOTE — Progress Notes (Addendum)
Subjective:  CC: Drew Mcmahon is a 77 y.o. male  Hospital stay day 1, Day of Surgery attempted ERCP for cbd stone  HPI: Notified via secure chat of abnormal CT results ordered by GI earlier today.  Upon questioning of patient, he states overall pain has not worsened since admission, well-controlled with current pain regimen.  Denies any nausea vomiting.  Asking when he can eat.  ROS:  General: Denies weight loss, weight gain, fatigue, fevers, chills, and night sweats. Heart: Denies chest pain, palpitations, racing heart, irregular heartbeat, leg pain or swelling, and decreased activity tolerance. Respiratory: Denies breathing difficulty, shortness of breath, wheezing, cough, and sputum. GI: Denies change in appetite, heartburn, nausea, vomiting, constipation, diarrhea, and blood in stool. GU: Denies difficulty urinating, pain with urinating, urgency, frequency, blood in urine.   Objective:   Temp:  [95.9 F (35.5 C)-98.1 F (36.7 C)] 98.1 F (36.7 C) (02/26 2024) Pulse Rate:  [43-66] 51 (02/26 2024) Resp:  [16-20] 18 (02/26 2024) BP: (124-169)/(63-72) 156/67 (02/26 2024) SpO2:  [97 %-100 %] 100 % (02/26 2024) Weight:  [93 kg] 93 kg (02/26 1542)     Height: 6' (182.9 cm) Weight: 93 kg BMI (Calculated): 27.8   Intake/Output this shift:   Intake/Output Summary (Last 24 hours) at 12/22/2022 2225 Last data filed at 12/22/2022 1932 Gross per 24 hour  Intake 669.13 ml  Output 375 ml  Net 294.13 ml    Constitutional :  alert, cooperative, appears stated age, and no distress  Respiratory:  clear to auscultation bilaterally  Cardiovascular:  regular rate and rhythm  Gastrointestinal: soft, non-tender; bowel sounds normal; no masses,  no organomegaly.   Skin: Cool and moist.   Psychiatric: Normal affect, non-agitated, not confused       LABS:     Latest Ref Rng & Units 12/22/2022    4:43 AM 12/21/2022    4:50 AM 12/20/2022   11:50 AM  CMP  Glucose 70 - 99 mg/dL 119  102  172    BUN 8 - 23 mg/dL '12  19  18   '$ Creatinine 0.61 - 1.24 mg/dL 0.77  0.80  0.91   Sodium 135 - 145 mmol/L 138  138  138   Potassium 3.5 - 5.1 mmol/L 3.4  3.7  3.4   Chloride 98 - 111 mmol/L 107  108  99   CO2 22 - 32 mmol/L '25  24  25   '$ Calcium 8.9 - 10.3 mg/dL 8.5  8.2  9.2   Total Protein 6.5 - 8.1 g/dL 5.5   7.4   Total Bilirubin 0.3 - 1.2 mg/dL 0.9   1.4   Alkaline Phos 38 - 126 U/L 61   73   AST 15 - 41 U/L 18   28   ALT 0 - 44 U/L 19   24       Latest Ref Rng & Units 12/22/2022    4:43 AM 12/21/2022    4:50 AM 12/20/2022   11:50 AM  CBC  WBC 4.0 - 10.5 K/uL 8.0  8.1  10.1   Hemoglobin 13.0 - 17.0 g/dL 11.4  11.2  14.1   Hematocrit 39.0 - 52.0 % 34.7  33.6  41.0   Platelets 150 - 400 K/uL 179  185  269     RADS: CLINICAL DATA:  Status post duodenal ulcer and upper GI procedure. Mid abdominal pain. ERCP performed today.   EXAM: CT ABDOMEN AND PELVIS WITH CONTRAST   TECHNIQUE: Multidetector CT  imaging of the abdomen and pelvis was performed using the standard protocol following bolus administration of intravenous contrast.   RADIATION DOSE REDUCTION: This exam was performed according to the departmental dose-optimization program which includes automated exposure control, adjustment of the mA and/or kV according to patient size and/or use of iterative reconstruction technique.   CONTRAST:  153m OMNIPAQUE IOHEXOL 300 MG/ML  SOLN   COMPARISON:  MRI abdomen 12/20/2022. CT abdomen and pelvis 12/20/2022.   FINDINGS: Lower chest: Minimal atelectasis in the lung bases.   Hepatobiliary: Patient is status post cholecystectomy. There is minimal stranding in the gallbladder fossa without focal fluid collection. There is no biliary ductal dilatation. No focal liver lesion identified.   Pancreas: Unremarkable. No pancreatic ductal dilatation or surrounding inflammatory changes.   Spleen: Normal in size without focal abnormality.   Adrenals/Urinary Tract: Adrenal glands  are unremarkable. No hydronephrosis or renal calculi. Bladder is unremarkable. There are left renal cysts measuring up to 16 mm.   Stomach/Bowel: There are new surgical clips at the level of the proximal duodenal. There is wall thickening of the proximal duodenal with a moderate amount of free intraperitoneal air surrounding the duodenal and in the right upper quadrant. There is also minimal free air tracking along the right retroperitoneum. Questionable small amount of extravasated oral contrast seen along the posterior aspect of the third portion of the duodenal posteriorly image 2/34 versus small duodenal diverticulum. Sigmoid colon diverticula are present. Otherwise, stomach, small bowel and colon are within normal limits. The appendix is surgically absent.   Vascular/Lymphatic: Aortic atherosclerosis. No enlarged abdominal or pelvic lymph nodes.   Reproductive: Prostate is unremarkable.   Other: There is a small right inguinal hernia containing fat and fluid. There is also small amount of mildly hyperdense fluid in the pelvis. There is no focal abdominal wall hernia. There is some mild focal body wall edema in the left anterior mid abdomen.   Musculoskeletal: No acute fracture. Degenerative changes affect the spine. Small sclerotic lesion in the T12 vertebral body is unchanged, possibly a bone island.   IMPRESSION: 1. Moderate amount of free intraperitoneal air in the right upper quadrant and right retroperitoneum. Findings are concerning for perforated duodenum. There is a questionable small amount of extravasated oral contrast of the third portion of the duodenal versus small duodenal diverticulum. 2. Small amount of mildly hyperdense fluid in the pelvis may represent blood products. 3. Status post cholecystectomy with minimal stranding in the gallbladder fossa. No focal fluid collection. 4. Small right inguinal hernia containing fat and fluid. 5. Mild focal body wall  edema in the left anterior mid abdomen.   Aortic Atherosclerosis (ICD10-I70.0).   Electronically Signed: By: ARonney AstersM.D. On: 12/22/2022 21:19   Assessment:   Status post attempted ERCP, which was aborted early due to presence of duodenal ulcer and possible mucosal injury.  Post procedure CT scan as noted above.  Review of images and I personally agree with the above findings.  Fortunately, patient's overall clinical exam and report is reassuring for no signs of acute abdomen.  Patient denies any worsening pain, nausea vomiting, and physical exam with abdominal exam essentially benign except for very minor tenderness to palpation with very deep right upper quadrant palpation.  No complaints of back pain.  Vital stable  Based on current clinical exam, questionable if the free air was present prior to procedure initiation, and the perforation was addressed via clips placed during the endoscopy procedure.  Questionable contrast extravasation is only  concern for the free air potentially being post procedure.  Recommend continuing serial abdominal exams and frequent vitals, strict n.p.o. to minimize disruption of the clips, repeat labs in the a.m. since proceeding with surgery at this time likely will not provide any significant benefit, especially if the repair has been adequately completed with clips endoscopically.  Extensive discussion with patient and family members at bedside.  He verbalized understanding and are comfortable with proceeding with current management. Floor RN as well as nurse practitioner on floor notified as well  labs/images/medications/previous chart entries reviewed personally and relevant changes/updates noted above.

## 2022-12-22 NOTE — Progress Notes (Signed)
PHARMACY NOTE:  ANTIMICROBIAL RENAL DOSAGE ADJUSTMENT  Current antimicrobial regimen includes a mismatch between antimicrobial dosage and estimated renal function.  As per policy approved by the Pharmacy & Therapeutics and Medical Executive Committees, the antimicrobial dosage will be adjusted accordingly.  Current antimicrobial dosage:  Zosyn 3.375 gm IV Q6H over 30 min   Indication: intra-abdominal infection   Renal Function:  Estimated Creatinine Clearance: 86.2 mL/min (by C-G formula based on SCr of 0.77 mg/dL). '[]'$      On intermittent HD, scheduled: '[]'$      On CRRT    Antimicrobial dosage has been changed to:  Zosyn 3.375 gm IV Q8H EI   Additional comments:   Thank you for allowing pharmacy to be a part of this patient's care.  Orene Desanctis, [facility] 12/22/2022 9:36 PM

## 2022-12-22 NOTE — Progress Notes (Signed)
Patient transported to Endo department

## 2022-12-23 ENCOUNTER — Encounter: Payer: Self-pay | Admitting: Gastroenterology

## 2022-12-23 DIAGNOSIS — R109 Unspecified abdominal pain: Secondary | ICD-10-CM | POA: Diagnosis not present

## 2022-12-23 DIAGNOSIS — K269 Duodenal ulcer, unspecified as acute or chronic, without hemorrhage or perforation: Secondary | ICD-10-CM | POA: Insufficient documentation

## 2022-12-23 DIAGNOSIS — K2980 Duodenitis without bleeding: Secondary | ICD-10-CM | POA: Diagnosis not present

## 2022-12-23 DIAGNOSIS — E785 Hyperlipidemia, unspecified: Secondary | ICD-10-CM | POA: Diagnosis not present

## 2022-12-23 DIAGNOSIS — K2100 Gastro-esophageal reflux disease with esophagitis, without bleeding: Secondary | ICD-10-CM

## 2022-12-23 DIAGNOSIS — K631 Perforation of intestine (nontraumatic): Secondary | ICD-10-CM

## 2022-12-23 DIAGNOSIS — R1084 Generalized abdominal pain: Secondary | ICD-10-CM

## 2022-12-23 DIAGNOSIS — E876 Hypokalemia: Secondary | ICD-10-CM | POA: Diagnosis not present

## 2022-12-23 LAB — COMPREHENSIVE METABOLIC PANEL
ALT: 17 U/L (ref 0–44)
AST: 16 U/L (ref 15–41)
Albumin: 2.5 g/dL — ABNORMAL LOW (ref 3.5–5.0)
Alkaline Phosphatase: 62 U/L (ref 38–126)
Anion gap: 6 (ref 5–15)
BUN: 12 mg/dL (ref 8–23)
CO2: 24 mmol/L (ref 22–32)
Calcium: 8 mg/dL — ABNORMAL LOW (ref 8.9–10.3)
Chloride: 108 mmol/L (ref 98–111)
Creatinine, Ser: 1 mg/dL (ref 0.61–1.24)
GFR, Estimated: >60 mL/min (ref >60)
Glucose, Bld: 92 mg/dL (ref 70–99)
Potassium: 4 mmol/L (ref 3.5–5.1)
Sodium: 138 mmol/L (ref 135–145)
Total Bilirubin: 1.3 mg/dL — ABNORMAL HIGH (ref 0.3–1.2)
Total Protein: 5.3 g/dL — ABNORMAL LOW (ref 6.5–8.1)

## 2022-12-23 LAB — GLUCOSE, CAPILLARY: Glucose-Capillary: 92 mg/dL (ref 70–99)

## 2022-12-23 LAB — CBC
HCT: 33.6 % — ABNORMAL LOW (ref 39.0–52.0)
Hemoglobin: 11.3 g/dL — ABNORMAL LOW (ref 13.0–17.0)
MCH: 31 pg (ref 26.0–34.0)
MCHC: 33.6 g/dL (ref 30.0–36.0)
MCV: 92.1 fL (ref 80.0–100.0)
Platelets: 195 10*3/uL (ref 150–400)
RBC: 3.65 MIL/uL — ABNORMAL LOW (ref 4.22–5.81)
RDW: 12.3 % (ref 11.5–15.5)
WBC: 11.7 10*3/uL — ABNORMAL HIGH (ref 4.0–10.5)
nRBC: 0 % (ref 0.0–0.2)

## 2022-12-23 LAB — PHOSPHORUS: Phosphorus: 3.4 mg/dL (ref 2.5–4.6)

## 2022-12-23 LAB — LIPASE, BLOOD: Lipase: 33 U/L (ref 11–51)

## 2022-12-23 LAB — MAGNESIUM: Magnesium: 1.9 mg/dL (ref 1.7–2.4)

## 2022-12-23 MED ORDER — HYDRALAZINE HCL 20 MG/ML IJ SOLN: 10.0000 mg | INTRAMUSCULAR | Status: DC | PRN

## 2022-12-23 MED ORDER — BISACODYL 10 MG RE SUPP: 10.0000 mg | Freq: Every day | RECTAL | Status: DC | PRN

## 2022-12-23 MED ORDER — METOPROLOL TARTRATE 5 MG/5ML IV SOLN
2.5000 mg | Freq: Four times a day (QID) | INTRAVENOUS | Status: DC
  Administered 2022-12-23 – 2022-12-25 (×4): 2.5 mg via INTRAVENOUS
  Filled 2022-12-23 (×8): qty 5

## 2022-12-23 NOTE — Progress Notes (Signed)
[place] Hospital Day(s): 2.   Post op day(s): 1 Day Post-Op.   Interval History:  Patient seen and examined No acute events or new complaints overnight.  Maintaining hemodynamics Patient reports he is feeling "pretty good" this morning Had some RUQ soreness with breath holding during MRI but otherwise denied pain No fever, chills, nausea, emesis  Did have a slight bump in leukocytosis to 11.7K this AM Mild hyperbilirubinemia to 1.3; mild Labs otherwise reassuring He is on Zosyn Strict NPO  Vital signs in last 24 hours: [min-max] current  Temp:  [95.9 F (35.5 C)-98.1 F (36.7 C)] 98.1 F (36.7 C) (02/27 0426) Pulse Rate:  [43-66] 65 (02/27 0426) Resp:  [16-20] 16 (02/27 0426) BP: (124-169)/(53-69) 130/55 (02/27 0426) SpO2:  [96 %-100 %] 96 % (02/27 0426) Weight:  [93 kg] 93 kg (02/26 1542)     Height: 6' (182.9 cm) Weight: 93 kg BMI (Calculated): 27.8   Intake/Output last 2 shifts:  02/26 0701 - 02/27 0700 In: 1454.1 [I.V.:1454.1] Out: 375 [Urine:375]   Physical Exam:  Constitutional: alert, cooperative and no distress  Respiratory: breathing non-labored at rest  Cardiovascular: regular rate and sinus rhythm  Gastrointestinal: Abdomen is soft, he is non-tender, non-distended, no rebound/guarding. He remains without peritonitis  Integumentary: Laparoscopic incisions are healing well; dermabond starting to flake off appropriately, no erythema or drainage   Labs:     Latest Ref Rng & Units 12/23/2022    2:47 AM 12/22/2022    4:43 AM 12/21/2022    4:50 AM  CBC  WBC 4.0 - 10.5 K/uL 11.7  8.0  8.1   Hemoglobin 13.0 - 17.0 g/dL 11.3  11.4  11.2   Hematocrit 39.0 - 52.0 % 33.6  34.7  33.6   Platelets 150 - 400 K/uL 195  179  185       Latest Ref Rng & Units 12/23/2022    2:47 AM 12/22/2022    4:43 AM 12/21/2022    4:50 AM  CMP  Glucose 70 - 99 mg/dL 92  119  102   BUN 8 - 23 mg/dL '12  12  19   '$ Creatinine 0.61 - 1.24 mg/dL  1.00  0.77  0.80   Sodium 135 - 145 mmol/L 138  138  138   Potassium 3.5 - 5.1 mmol/L 4.0  3.4  3.7   Chloride 98 - 111 mmol/L 108  107  108   CO2 22 - 32 mmol/L '24  25  24   '$ Calcium 8.9 - 10.3 mg/dL 8.0  8.5  8.2   Total Protein 6.5 - 8.1 g/dL 5.3  5.5    Total Bilirubin 0.3 - 1.2 mg/dL 1.3  0.9    Alkaline Phos 38 - 126 U/L 62  61    AST 15 - 41 U/L 16  18    ALT 0 - 44 U/L 17  19      Imaging studies: No new pertinent imaging studies   Assessment/Plan: 77 y.o. male found to have a mucosal tear of the duodenal during ERCP yesterday (02/26) which was immediately clipped, 8 days s/p robotic assisted laparoscopic cholecystectomy and lysis of adhesions    - He did have a bump in leukocytosis this morning but he otherwise is doing well clinically, abdomen is benign, and he remains hemodynamically stable. For now, we will continue with conservative measures unless he were to clinically deteriorate. Will keep him strict NPO for 2-3 days and proceed with  UGI towards the end of the week to reassess perforation repair before considering diet.   - Continue PPI - Continue IVF support - Continue IV Abx (Zosyn)   - Monitor abdominal examination - Pain control prn; antiemetics prn - Monitor bilirubinemia; mild - given perforation, not candidate for repeat ERCP   - Monitor leukocytosis; morning labs - Okay to mobilize - Further management per primary service; we will follow along   All of the above findings and recommendations were discussed with the patient, patient's family at bedside, and the medical team, and all of their questions were answered to their expressed satisfaction.  -- Edison Simon, PA-C [facility] 12/23/2022, 8:37 AM M-F: 7am - 4pm

## 2022-12-23 NOTE — Progress Notes (Signed)
GI Inpatient Follow-up Note  Subjective:  Patient seen and is clinically fine. No abdominal pain. Went walking today.  Scheduled Inpatient Medications:   metoprolol tartrate  2.5 mg Intravenous Q6H    Continuous Inpatient Infusions:    0.9 % NaCl with KCl 20 mEq / L 75 mL/hr at 12/23/22 0606   pantoprazole 8 mg/hr (12/23/22 0848)   piperacillin-tazobactam 3.375 g (12/23/22 1150)    PRN Inpatient Medications:  acetaminophen **OR** acetaminophen, bisacodyl, hydrALAZINE, morphine injection, ondansetron (ZOFRAN) IV  Review of Systems:  Review of Systems  Constitutional:  Negative for chills and fever.  Respiratory:  Negative for shortness of breath.   Cardiovascular:  Negative for chest pain.  Gastrointestinal:  Negative for abdominal pain and diarrhea.  Musculoskeletal:  Negative for joint pain.  Skin:  Negative for rash.  Neurological:  Negative for focal weakness.  Psychiatric/Behavioral:  Negative for substance abuse.   All other systems reviewed and are negative.     Physical Examination: BP (!) 124/55 (BP Location: Left Arm)   Pulse 70   Temp 98 F (36.7 C) (Oral)   Resp 18   Ht 6' (1.829 m)   Wt 93 kg   SpO2 97%   BMI 27.80 kg/m  Gen: NAD, alert and oriented x 4 HEENT: PEERLA, EOMI, Neck: supple, no JVD or thyromegaly Chest: No respiratory distress Abd: soft, non-tender, non-distended Ext: no edema Skin: no rash or lesions noted Lymph: no LAD  Data: Lab Results  Component Value Date   WBC 11.7 (H) 12/23/2022   HGB 11.3 (L) 12/23/2022   HCT 33.6 (L) 12/23/2022   MCV 92.1 12/23/2022   PLT 195 12/23/2022   Recent Labs  Lab 12/21/22 0450 12/22/22 0443 12/23/22 0247  HGB 11.2* 11.4* 11.3*   Lab Results  Component Value Date   NA 138 12/23/2022   K 4.0 12/23/2022   CL 108 12/23/2022   CO2 24 12/23/2022   BUN 12 12/23/2022   CREATININE 1.00 12/23/2022   Lab Results  Component Value Date   ALT 17 12/23/2022   AST 16 12/23/2022   ALKPHOS  62 12/23/2022   BILITOT 1.3 (H) 12/23/2022   Recent Labs  Lab 12/20/22 1150  INR 1.2   Assessment/Plan: Mr. Wehri is a 77 y.o. gentleman with recent cholecystectomy and post-op choldocholithiasis who underwent ERCP complicated by mucosal tear of duodenum with clipping. Patient clinically stable with no abdominal pain and soft abdomen.  Recommendations:  - appreciate surgery recs - continue PPI - continue IVF - continue IV antibiotics - monitor labs  Please call with any questions or concerns.  Raylene Miyamoto MD, MPH [place]

## 2022-12-23 NOTE — Anesthesia Postprocedure Evaluation (Addendum)
Anesthesia Post Note  Patient: Drew Mcmahon  Procedure(s) Performed: ENDOSCOPIC RETROGRADE CHOLANGIOPANCREATOGRAPHY (ERCP)  Patient location during evaluation: Endoscopy Anesthesia Type: General Level of consciousness: awake and alert Pain management: pain level controlled Vital Signs Assessment: post-procedure vital signs reviewed and stable Respiratory status: spontaneous breathing, nonlabored ventilation, respiratory function stable and patient connected to nasal cannula oxygen Cardiovascular status: blood pressure returned to baseline and stable Postop Assessment: no apparent nausea or vomiting Anesthetic complications: no  No notable events documented.   Last Vitals:  Vitals:   12/22/22 2024 12/22/22 2338  BP: (!) 156/67 (!) 124/53  Pulse: (!) 51 (!) 58  Resp: 18 18  Temp: 36.7 C 36.7 C  SpO2: 100% 99%    Last Pain:  Vitals:   12/22/22 2338  TempSrc: Oral  PainSc:                  Dimas Millin

## 2022-12-23 NOTE — TOC CM/SW Note (Signed)
  Transition of Care [facility]) Screening Note   Patient Details  Name: Drew Mcmahon Date of Birth: 1946/02/04   Transition of Care [facility]) CM/SW Contact:    Candie Chroman, LCSW Phone Number: 12/23/2022, 1:24 PM    Transition of Care Department [facility]) has reviewed patient and no TOC needs have been identified at this time. We will continue to monitor patient advancement through interdisciplinary progression rounds. If new patient transition needs arise, please place a TOC consult.

## 2022-12-23 NOTE — Progress Notes (Signed)
PROGRESS NOTE  TRITON ALTICE [id number] DOB: Jun 27, 1946   PCP: Jon Billings, NP  Patient is from: Home  DOA: 12/20/2022 LOS: 2  Chief complaints Chief Complaint  Patient presents with   Emesis     Brief Narrative / Interim history: 77 year old M with PMH of HTN, HLD and recent laparoscopic cholecystectomy on 2/19 presenting with worsening abdominal pain, nausea and vomiting, and admitted for intractable abdominal pain, nausea and vomiting.  Vital stable.  Labs without significant finding.  CT concerning for duodenitis versus PUD.  GI consulted and recommended MRCP that is concerning for choledocholithiasis and duodenal ulcer versus inflammation of duodenal diverticulum.  Patient went for ERCP on 2/26 that showed LA grade C reflux esophagitis, normal stomach and nonbleeding duodenal ulcer with no stigmata of bleeding.  ERCP was aborted early due to duodenal ulcer and possible endoscopic mucosal injury that was clipped.  General surgery consulted.  Patient was started on IV Zosyn.  CT abdomen and pelvis with contrast showed free intraperitoneal air in RUQ and right retroperitoneum as well as questionable contrast extravasation from third portion of duodenum versus duodenal diverticulum.  General surgery recommended strict n.p.o.   Subjective: Seen and examined earlier this morning.  Ambulating in the hallway.  Has no complaints.  Denies nausea, vomiting or abdominal pain.  Objective: Vitals:   12/22/22 2024 12/22/22 2338 12/23/22 0426 12/23/22 0845  BP: (!) 156/67 (!) 124/53 (!) 130/55 (!) 124/55  Pulse: (!) 51 (!) 58 65 70  Resp: '18 18 16 18  '$ Temp: 98.1 F (36.7 C) 98.1 F (36.7 C) 98.1 F (36.7 C) 98 F (36.7 C)  TempSrc: Oral Oral Oral Oral  SpO2: 100% 99% 96% 97%  Weight:      Height:        Examination:  GENERAL: No apparent distress.  Nontoxic. HEENT: MMM.  Vision and hearing grossly intact.  NECK: Supple.  No apparent JVD.  RESP:  No IWOB.  Fair aeration  bilaterally. CVS:  RRR. Heart sounds normal.  ABD/GI/GU: BS+. Abd soft, NTND.  No rebound or guarding.  Laparoscopic wounds healing. MSK/EXT:   No apparent deformity. Moves extremities. No edema.  SKIN: Laparoscopic wounds healing. NEURO: Awake and alert. Oriented appropriately.  No apparent focal neuro deficit. PSYCH: Calm. Normal affect.   Procedures:  2/26-ERCP showed LA grade C reflux esophagitis, normal stomach and nonbleeding duodenal ulcer with no stigmata of bleeding.  ERCP was aborted early due to duodenal ulcer and possible endoscopic mucosal injury that was clipped  Microbiology summarized: None  Assessment and plan: Principal Problem:   Abdominal pain Active Problems:   Duodenitis   Hypokalemia   Hyperlipidemia   Hypertension   Choledocholithiasis   Duodenal ulcer   Reflux esophagitis   Duodenal perforation (HCC)  Reflux esophagitis/duodenal ulcer/possible duodenal perforation: Patient has no signs of peritonitis.  Appears well clinically.  Mild leukocytosis. -Surgery recs-continue PPI, IVF, IV Zosyn, abdominal exam, strict n.p.o. for 2 to 3 days and UGI toward the end of the week  Abdominal pain/nausea/vomiting: Imaging concerning for choledocholithiasis and possible duodenal ulcer.  Patient had recent laparoscopic cholecystectomy.  No signs of biliary leak on MRCP.  LFT and lipase within normal.  ERCP as above. -Management as above.  Essential hypertension: Normotensive. -Hold p.o. Toprol and Diovan. -IV metoprolol with holding parameters.  Normocytic anemia: No evidence of bleeding.  Initial drop likely dilutional.  No iron deficiency on anemia panel.  Now stable. Recent Labs    12/01/22 1402 12/09/22 0911 12/20/22  1150 12/21/22 0450 12/22/22 0443 12/23/22 0247  HGB 15.8 15.0 14.1 11.2* 11.4* 11.3*  -Continue monitoring   Hypokalemia: -Monitor replenish as appropriate   Hyperlipidemia -Will resume Lipitor once he started taking p.o.    Body mass  index is 27.8 kg/m.          DVT prophylaxis:  SCDs Start: 12/20/22 1657  Code Status: Full code Family Communication: Updated patient's wife and son at bedside Level of care: Med-Surg Status is: Inpatient The patient will remain inpatient because: Possible choledocholithiasis.   Final disposition: Home once medically cleared Consultants:  GI  35 minutes with more than 50% spent in reviewing records, counseling patient/family and coordinating care.   Sch Meds:  Scheduled Meds:  metoprolol tartrate  2.5 mg Intravenous Q6H    Continuous Infusions:  0.9 % NaCl with KCl 20 mEq / L 75 mL/hr at 12/23/22 0606   pantoprazole 8 mg/hr (12/23/22 0848)   piperacillin-tazobactam 3.375 g (12/23/22 1150)   PRN Meds:.acetaminophen **OR** acetaminophen, bisacodyl, hydrALAZINE, morphine injection, ondansetron (ZOFRAN) IV  Antimicrobials: Anti-infectives (From admission, onward)    Start     Dose/Rate Route Frequency Ordered Stop   12/22/22 2145  piperacillin-tazobactam (ZOSYN) IVPB 3.375 g        3.375 g 12.5 mL/hr over 240 Minutes Intravenous Every 8 hours 12/22/22 2133          I have personally reviewed the following labs and images: CBC: Recent Labs  Lab 12/20/22 1150 12/21/22 0450 12/22/22 0443 12/23/22 0247  WBC 10.1 8.1 8.0 11.7*  HGB 14.1 11.2* 11.4* 11.3*  HCT 41.0 33.6* 34.7* 33.6*  MCV 88.9 90.6 90.6 92.1  PLT 269 185 179 195   BMP &GFR Recent Labs  Lab 12/20/22 1150 12/21/22 0450 12/22/22 0443 12/23/22 0247  NA 138 138 138 138  K 3.4* 3.7 3.4* 4.0  CL 99 108 107 108  CO2 '25 24 25 24  '$ GLUCOSE 172* 102* 119* 92  BUN '18 19 12 12  '$ CREATININE 0.91 0.80 0.77 1.00  CALCIUM 9.2 8.2* 8.5* 8.0*  MG 2.0  --  1.9 1.9  PHOS  --   --  2.9 3.4   Estimated Creatinine Clearance: 69 mL/min (by C-G formula based on SCr of 1 mg/dL). Liver & Pancreas: Recent Labs  Lab 12/20/22 1150 12/22/22 0443 12/23/22 0247  AST '28 18 16  '$ ALT '24 19 17  '$ ALKPHOS 73 61 62   BILITOT 1.4* 0.9 1.3*  PROT 7.4 5.5* 5.3*  ALBUMIN 3.4* 2.5* 2.5*   Recent Labs  Lab 12/20/22 1150 12/23/22 0247  LIPASE 27 33   No results for input(s): "AMMONIA" in the last 168 hours. Diabetic: No results for input(s): "HGBA1C" in the last 72 hours. Recent Labs  Lab 12/21/22 0803 12/22/22 0744 12/23/22 0757  GLUCAP 102* 107* 92   Cardiac Enzymes: No results for input(s): "CKTOTAL", "CKMB", "CKMBINDEX", "TROPONINI" in the last 168 hours. No results for input(s): "PROBNP" in the last 8760 hours. Coagulation Profile: Recent Labs  Lab 12/20/22 1150  INR 1.2   Thyroid Function Tests: No results for input(s): "TSH", "T4TOTAL", "FREET4", "T3FREE", "THYROIDAB" in the last 72 hours. Lipid Profile: No results for input(s): "CHOL", "HDL", "LDLCALC", "TRIG", "CHOLHDL", "LDLDIRECT" in the last 72 hours. Anemia Panel: Recent Labs    12/22/22 0443  VITAMINB12 298  FOLATE 23.0  FERRITIN 220  TIBC 175*  IRON 45  RETICCTPCT 1.9   Urine analysis:    Component Value Date/Time   COLORURINE AMBER (A)  12/20/2022 1150   APPEARANCEUR CLEAR (A) 12/20/2022 1150   APPEARANCEUR Clear 12/09/2022 0910   LABSPEC 1.024 12/20/2022 1150   PHURINE 6.0 12/20/2022 1150   GLUCOSEU NEGATIVE 12/20/2022 1150   HGBUR NEGATIVE 12/20/2022 [place] 12/20/2022 1150   BILIRUBINUR Negative 12/09/2022 0910   KETONESUR 20 (A) 12/20/2022 1150   PROTEINUR 30 (A) 12/20/2022 1150   NITRITE NEGATIVE 12/20/2022 [place] 12/20/2022 1150   Sepsis Labs: Invalid input(s): "PROCALCITONIN", "LACTICIDVEN"  Microbiology: No results found for this or any previous visit (from the past 240 hour(s)).  Radiology Studies: CT ABDOMEN PELVIS W CONTRAST  Addendum Date: 12/22/2022   ADDENDUM REPORT: 12/22/2022 21:44 ADDENDUM: These results were called by telephone at the time of interpretation on 12/22/2022 at 9:44 pm to provider Dr. Jaci Carrel, who verbally acknowledged these  results. Electronically Signed   By: Ronney Asters M.D.   On: 12/22/2022 21:44   Result Date: 12/22/2022 CLINICAL DATA:  Status post duodenal ulcer and upper GI procedure. Mid abdominal pain. ERCP performed today. EXAM: CT ABDOMEN AND PELVIS WITH CONTRAST TECHNIQUE: Multidetector CT imaging of the abdomen and pelvis was performed using the standard protocol following bolus administration of intravenous contrast. RADIATION DOSE REDUCTION: This exam was performed according to the departmental dose-optimization program which includes automated exposure control, adjustment of the mA and/or kV according to patient size and/or use of iterative reconstruction technique. CONTRAST:  1108m OMNIPAQUE IOHEXOL 300 MG/ML  SOLN COMPARISON:  MRI abdomen 12/20/2022. CT abdomen and pelvis 12/20/2022. FINDINGS: Lower chest: Minimal atelectasis in the lung bases. Hepatobiliary: Patient is status post cholecystectomy. There is minimal stranding in the gallbladder fossa without focal fluid collection. There is no biliary ductal dilatation. No focal liver lesion identified. Pancreas: Unremarkable. No pancreatic ductal dilatation or surrounding inflammatory changes. Spleen: Normal in size without focal abnormality. Adrenals/Urinary Tract: Adrenal glands are unremarkable. No hydronephrosis or renal calculi. Bladder is unremarkable. There are left renal cysts measuring up to 16 mm. Stomach/Bowel: There are new surgical clips at the level of the proximal duodenal. There is wall thickening of the proximal duodenal with a moderate amount of free intraperitoneal air surrounding the duodenal and in the right upper quadrant. There is also minimal free air tracking along the right retroperitoneum. Questionable small amount of extravasated oral contrast seen along the posterior aspect of the third portion of the duodenal posteriorly image 2/34 versus small duodenal diverticulum. Sigmoid colon diverticula are present. Otherwise, stomach, small  bowel and colon are within normal limits. The appendix is surgically absent. Vascular/Lymphatic: Aortic atherosclerosis. No enlarged abdominal or pelvic lymph nodes. Reproductive: Prostate is unremarkable. Other: There is a small right inguinal hernia containing fat and fluid. There is also small amount of mildly hyperdense fluid in the pelvis. There is no focal abdominal wall hernia. There is some mild focal body wall edema in the left anterior mid abdomen. Musculoskeletal: No acute fracture. Degenerative changes affect the spine. Small sclerotic lesion in the T12 vertebral body is unchanged, possibly a bone island. IMPRESSION: 1. Moderate amount of free intraperitoneal air in the right upper quadrant and right retroperitoneum. Findings are concerning for perforated duodenum. There is a questionable small amount of extravasated oral contrast of the third portion of the duodenal versus small duodenal diverticulum. 2. Small amount of mildly hyperdense fluid in the pelvis may represent blood products. 3. Status post cholecystectomy with minimal stranding in the gallbladder fossa. No focal fluid collection. 4. Small right inguinal hernia containing fat  and fluid. 5. Mild focal body wall edema in the left anterior mid abdomen. Aortic Atherosclerosis (ICD10-I70.0). Electronically Signed: By: Ronney Asters M.D. On: 12/22/2022 21:19   DG C-Arm 1-60 Min-No Report  Result Date: 12/22/2022 Fluoroscopy was utilized by the requesting physician.  No radiographic interpretation.      Renne Cornick T. [place]  If 7PM-7AM, please contact night-coverage [email or web address] 12/23/2022, 2:01 PM

## 2022-12-24 DIAGNOSIS — K2980 Duodenitis without bleeding: Secondary | ICD-10-CM | POA: Diagnosis not present

## 2022-12-24 DIAGNOSIS — R109 Unspecified abdominal pain: Secondary | ICD-10-CM | POA: Diagnosis not present

## 2022-12-24 DIAGNOSIS — E785 Hyperlipidemia, unspecified: Secondary | ICD-10-CM | POA: Diagnosis not present

## 2022-12-24 DIAGNOSIS — E876 Hypokalemia: Secondary | ICD-10-CM | POA: Diagnosis not present

## 2022-12-24 LAB — COMPREHENSIVE METABOLIC PANEL
ALT: 15 U/L (ref 0–44)
AST: 14 U/L — ABNORMAL LOW (ref 15–41)
Albumin: 2.5 g/dL — ABNORMAL LOW (ref 3.5–5.0)
Alkaline Phosphatase: 64 U/L (ref 38–126)
Anion gap: 7 (ref 5–15)
BUN: 11 mg/dL (ref 8–23)
CO2: 22 mmol/L (ref 22–32)
Calcium: 8.1 mg/dL — ABNORMAL LOW (ref 8.9–10.3)
Chloride: 109 mmol/L (ref 98–111)
Creatinine, Ser: 1 mg/dL (ref 0.61–1.24)
GFR, Estimated: >60 mL/min (ref >60)
Glucose, Bld: 73 mg/dL (ref 70–99)
Potassium: 4.1 mmol/L (ref 3.5–5.1)
Sodium: 138 mmol/L (ref 135–145)
Total Bilirubin: 1.5 mg/dL — ABNORMAL HIGH (ref 0.3–1.2)
Total Protein: 5.4 g/dL — ABNORMAL LOW (ref 6.5–8.1)

## 2022-12-24 LAB — CBC
HCT: 32.5 % — ABNORMAL LOW (ref 39.0–52.0)
Hemoglobin: 10.9 g/dL — ABNORMAL LOW (ref 13.0–17.0)
MCH: 30.8 pg (ref 26.0–34.0)
MCHC: 33.5 g/dL (ref 30.0–36.0)
MCV: 91.8 fL (ref 80.0–100.0)
Platelets: 173 10*3/uL (ref 150–400)
RBC: 3.54 MIL/uL — ABNORMAL LOW (ref 4.22–5.81)
RDW: 12.6 % (ref 11.5–15.5)
WBC: 12.7 10*3/uL — ABNORMAL HIGH (ref 4.0–10.5)
nRBC: 0 % (ref 0.0–0.2)

## 2022-12-24 LAB — GLUCOSE, CAPILLARY
Glucose-Capillary: 76 mg/dL (ref 70–99)
Glucose-Capillary: 181 mg/dL — ABNORMAL HIGH (ref 70–99)
Glucose-Capillary: 69 mg/dL — ABNORMAL LOW (ref 70–99)

## 2022-12-24 LAB — MAGNESIUM: Magnesium: 1.9 mg/dL (ref 1.7–2.4)

## 2022-12-24 MED ORDER — DEXTROSE 50 % IV SOLN
25.0000 g | INTRAVENOUS | Status: DC | PRN
  Administered 2022-12-24: 25 g via INTRAVENOUS
  Filled 2022-12-24: qty 50

## 2022-12-24 NOTE — Progress Notes (Signed)
PROGRESS NOTE  CALLIS ADAMIC [id number] DOB: [birth year]   PCP: Jon Billings, NP  Patient is from: Home  DOA: 12/20/2022 LOS: 2  Chief complaints Chief Complaint  Patient presents with   Emesis     Brief Narrative / Interim history: 77 year old M with PMH of HTN, HLD and recent laparoscopic cholecystectomy on 2/19 presenting with worsening abdominal pain, nausea and vomiting, and admitted for intractable abdominal pain, nausea and vomiting.  Vital stable.  Labs without significant finding.  CT concerning for duodenitis versus PUD.  GI consulted and recommended MRCP that is concerning for choledocholithiasis and duodenal ulcer versus inflammation of duodenal diverticulum.  Patient went for ERCP on 2/26 that showed LA grade C reflux esophagitis, normal stomach and nonbleeding duodenal ulcer with no stigmata of bleeding.  ERCP was aborted early due to duodenal ulcer and possible endoscopic mucosal injury that was clipped.  General surgery consulted.  Patient was started on IV Zosyn.  CT abdomen and pelvis with contrast showed free intraperitoneal air in RUQ and right retroperitoneum as well as questionable contrast extravasation from third portion of duodenum versus duodenal diverticulum.   General surgery recommended strict n.p.o. until  UGIS on 2/29.   Subjective: Seen and examined earlier this morning.  No major events overnight of this morning.  No complaints.  Objective: Vitals:   12/22/22 2024 12/22/22 2338 12/23/22 0426 12/23/22 0845  BP: (!) 156/67 (!) 124/53 (!) 130/55 (!) 124/55  Pulse: (!) 51 (!) 58 65 70  Resp: '18 18 16 18  '$ Temp: 98.1 F (36.7 C) 98.1 F (36.7 C) 98.1 F (36.7 C) 98 F (36.7 C)  TempSrc: Oral Oral Oral Oral  SpO2: 100% 99% 96% 97%  Weight:      Height:        Examination:  GENERAL: No apparent distress.  Nontoxic. HEENT: MMM.  Vision and hearing grossly intact.  NECK: Supple.  No apparent JVD.  RESP:  No IWOB.  Fair aeration  bilaterally. CVS:  RRR. Heart sounds normal.  ABD/GI/GU: BS+. Abd soft, NTND.  Laparoscopic wounds healing. MSK/EXT:   No apparent deformity. Moves extremities. No edema.  SKIN: no apparent skin lesion or wound NEURO: Awake and alert. Oriented appropriately.  No apparent focal neuro deficit. PSYCH: Calm. Normal affect.   Procedures:  2/26-ERCP showed LA grade C reflux esophagitis, normal stomach and nonbleeding duodenal ulcer with no stigmata of bleeding.  ERCP was aborted early due to duodenal ulcer and possible endoscopic mucosal injury that was clipped  Microbiology summarized: None  Assessment and plan: Principal Problem:   Abdominal pain Active Problems:   Duodenitis   Hypokalemia   Hyperlipidemia   Hypertension   Choledocholithiasis   Duodenal ulcer   Reflux esophagitis   Duodenal perforation (HCC)  Reflux esophagitis/duodenal ulcer/possible duodenal perforation:  no signs and symptoms of peritonitis.  Appears well clinically.  Mild leukocytosis. -Surgery recs-continue PPI, IVF, IV Zosyn, abdominal exam, strict n.p.o. until UGI on 2/29.  Abdominal pain/nausea/vomiting: Had recent lap chole.  LFT within normal except for mild hyperbilirubinemia.  Imaging raised concern for choledocholithiasis and possible duodenal ulcer.   No signs of biliary leak on MRCP.  LFT and lipase within normal.  ERCP as above. -Management as above.  Essential hypertension: BP within acceptable range. -Hold p.o. Toprol and Diovan. -IV metoprolol 2.5 mg every 6 hours with holding parameters.  Normocytic anemia: No evidence of bleeding.  Initial drop likely dilutional.  No iron deficiency on anemia panel.  Now stable. Recent Labs  12/01/22 1402 12/09/22 0911 12/20/22 1150 12/21/22 0450 12/22/22 0443 12/23/22 0247  HGB 15.8 15.0 14.1 11.2* 11.4* 11.3*  -Continue monitoring   Hypokalemia: -Monitor replenish as appropriate   Hyperlipidemia -Resume Lipitor once he started taking p.o.     Body mass index is 27.8 kg/m.          DVT prophylaxis:  SCDs Start: 12/20/22 1657  Code Status: Full code Family Communication: None at bedside. Level of care: Med-Surg Status is: Inpatient The patient will remain inpatient because: Possible duodenal perforation   Final disposition: Home once medically cleared Consultants:  GI General surgery  35 minutes with more than 50% spent in reviewing records, counseling patient/family and coordinating care.   Sch Meds:  Scheduled Meds:  metoprolol tartrate  2.5 mg Intravenous Q6H    Continuous Infusions:  0.9 % NaCl with KCl 20 mEq / L 75 mL/hr at 12/23/22 0606   pantoprazole 8 mg/hr (12/23/22 0848)   piperacillin-tazobactam 3.375 g (12/23/22 1150)   PRN Meds:.acetaminophen **OR** acetaminophen, bisacodyl, hydrALAZINE, morphine injection, ondansetron (ZOFRAN) IV  Antimicrobials: Anti-infectives (From admission, onward)    Start     Dose/Rate Route Frequency Ordered Stop   12/22/22 2145  piperacillin-tazobactam (ZOSYN) IVPB 3.375 g        3.375 g 12.5 mL/hr over 240 Minutes Intravenous Every 8 hours 12/22/22 2133          I have personally reviewed the following labs and images: CBC: Recent Labs  Lab 12/20/22 1150 12/21/22 0450 12/22/22 0443 12/23/22 0247  WBC 10.1 8.1 8.0 11.7*  HGB 14.1 11.2* 11.4* 11.3*  HCT 41.0 33.6* 34.7* 33.6*  MCV 88.9 90.6 90.6 92.1  PLT 269 185 179 195   BMP &GFR Recent Labs  Lab 12/20/22 1150 12/21/22 0450 12/22/22 0443 12/23/22 0247  NA 138 138 138 138  K 3.4* 3.7 3.4* 4.0  CL 99 108 107 108  CO2 '25 24 25 24  '$ GLUCOSE 172* 102* 119* 92  BUN '18 19 12 12  '$ CREATININE 0.91 0.80 0.77 1.00  CALCIUM 9.2 8.2* 8.5* 8.0*  MG 2.0  --  1.9 1.9  PHOS  --   --  2.9 3.4   Estimated Creatinine Clearance: 69 mL/min (by C-G formula based on SCr of 1 mg/dL). Liver & Pancreas: Recent Labs  Lab 12/20/22 1150 12/22/22 0443 12/23/22 0247  AST '28 18 16  '$ ALT '24 19 17  '$ ALKPHOS 73  61 62  BILITOT 1.4* 0.9 1.3*  PROT 7.4 5.5* 5.3*  ALBUMIN 3.4* 2.5* 2.5*   Recent Labs  Lab 12/20/22 1150 12/23/22 0247  LIPASE 27 33   No results for input(s): "AMMONIA" in the last 168 hours. Diabetic: No results for input(s): "HGBA1C" in the last 72 hours. Recent Labs  Lab 12/21/22 0803 12/22/22 0744 12/23/22 0757  GLUCAP 102* 107* 92   Cardiac Enzymes: No results for input(s): "CKTOTAL", "CKMB", "CKMBINDEX", "TROPONINI" in the last 168 hours. No results for input(s): "PROBNP" in the last 8760 hours. Coagulation Profile: Recent Labs  Lab 12/20/22 1150  INR 1.2   Thyroid Function Tests: No results for input(s): "TSH", "T4TOTAL", "FREET4", "T3FREE", "THYROIDAB" in the last 72 hours. Lipid Profile: No results for input(s): "CHOL", "HDL", "LDLCALC", "TRIG", "CHOLHDL", "LDLDIRECT" in the last 72 hours. Anemia Panel: Recent Labs    12/22/22 0443  VITAMINB12 298  FOLATE 23.0  FERRITIN 220  TIBC 175*  IRON 45  RETICCTPCT 1.9   Urine analysis:    Component Value Date/Time  COLORURINE AMBER (A) 12/20/2022 1150   APPEARANCEUR CLEAR (A) 12/20/2022 1150   APPEARANCEUR Clear 12/09/2022 0910   LABSPEC 1.024 12/20/2022 1150   PHURINE 6.0 12/20/2022 1150   GLUCOSEU NEGATIVE 12/20/2022 1150   HGBUR NEGATIVE 12/20/2022 1150   BILIRUBINUR NEGATIVE 12/20/2022 1150   BILIRUBINUR Negative 12/09/2022 0910   KETONESUR 20 (A) 12/20/2022 1150   PROTEINUR 30 (A) 12/20/2022 1150   NITRITE NEGATIVE 12/20/2022 1150   LEUKOCYTESUR NEGATIVE 12/20/2022 1150   Sepsis Labs: Invalid input(s): "PROCALCITONIN", "LACTICIDVEN"  Microbiology: No results found for this or any previous visit (from the past 240 hour(s)).  Radiology Studies: CT ABDOMEN PELVIS W CONTRAST  Addendum Date: 12/22/2022   ADDENDUM REPORT: 12/22/2022 21:44 ADDENDUM: These results were called by telephone at the time of interpretation on 12/22/2022 at 9:44 pm to provider Dr. Jaci Carrel, who verbally acknowledged  these results. Electronically Signed   By: Ronney Asters M.D.   On: 12/22/2022 21:44   Result Date: 12/22/2022 CLINICAL DATA:  Status post duodenal ulcer and upper GI procedure. Mid abdominal pain. ERCP performed today. EXAM: CT ABDOMEN AND PELVIS WITH CONTRAST TECHNIQUE: Multidetector CT imaging of the abdomen and pelvis was performed using the standard protocol following bolus administration of intravenous contrast. RADIATION DOSE REDUCTION: This exam was performed according to the departmental dose-optimization program which includes automated exposure control, adjustment of the mA and/or kV according to patient size and/or use of iterative reconstruction technique. CONTRAST:  157m OMNIPAQUE IOHEXOL 300 MG/ML  SOLN COMPARISON:  MRI abdomen 12/20/2022. CT abdomen and pelvis 12/20/2022. FINDINGS: Lower chest: Minimal atelectasis in the lung bases. Hepatobiliary: Patient is status post cholecystectomy. There is minimal stranding in the gallbladder fossa without focal fluid collection. There is no biliary ductal dilatation. No focal liver lesion identified. Pancreas: Unremarkable. No pancreatic ductal dilatation or surrounding inflammatory changes. Spleen: Normal in size without focal abnormality. Adrenals/Urinary Tract: Adrenal glands are unremarkable. No hydronephrosis or renal calculi. Bladder is unremarkable. There are left renal cysts measuring up to 16 mm. Stomach/Bowel: There are new surgical clips at the level of the proximal duodenal. There is wall thickening of the proximal duodenal with a moderate amount of free intraperitoneal air surrounding the duodenal and in the right upper quadrant. There is also minimal free air tracking along the right retroperitoneum. Questionable small amount of extravasated oral contrast seen along the posterior aspect of the third portion of the duodenal posteriorly image 2/34 versus small duodenal diverticulum. Sigmoid colon diverticula are present. Otherwise, stomach,  small bowel and colon are within normal limits. The appendix is surgically absent. Vascular/Lymphatic: Aortic atherosclerosis. No enlarged abdominal or pelvic lymph nodes. Reproductive: Prostate is unremarkable. Other: There is a small right inguinal hernia containing fat and fluid. There is also small amount of mildly hyperdense fluid in the pelvis. There is no focal abdominal wall hernia. There is some mild focal body wall edema in the left anterior mid abdomen. Musculoskeletal: No acute fracture. Degenerative changes affect the spine. Small sclerotic lesion in the T12 vertebral body is unchanged, possibly a bone island. IMPRESSION: 1. Moderate amount of free intraperitoneal air in the right upper quadrant and right retroperitoneum. Findings are concerning for perforated duodenum. There is a questionable small amount of extravasated oral contrast of the third portion of the duodenal versus small duodenal diverticulum. 2. Small amount of mildly hyperdense fluid in the pelvis may represent blood products. 3. Status post cholecystectomy with minimal stranding in the gallbladder fossa. No focal fluid collection. 4. Small right inguinal  hernia containing fat and fluid. 5. Mild focal body wall edema in the left anterior mid abdomen. Aortic Atherosclerosis (ICD10-I70.0). Electronically Signed: By: Ronney Asters M.D. On: 12/22/2022 21:19   DG C-Arm 1-60 Min-No Report  Result Date: 12/22/2022 Fluoroscopy was utilized by the requesting physician.  No radiographic interpretation.      Laith Antonelli T. [place]  If 7PM-7AM, please contact night-coverage [email or web address] 12/23/2022, 2:01 PM

## 2022-12-24 NOTE — Progress Notes (Addendum)
[place] [facility] Day(s): 3.   Interval History:  Patient seen and examined No acute events or new complaints overnight.  Maintaining hemodynamics Patient reports he is feeling "pretty good" this morning Had some RUQ soreness with breathing deeply but otherwise denied pain No fever, chills, nausea, emesis  Did have a slight bump in leukocytosis to 12.7K this AM Mild hyperbilirubinemia to 1.5; mild Labs otherwise reassuring He is on Zosyn Strict NPO  Vital signs in last 24 hours: [min-max] current  Temp:  [97.9 F (36.6 C)-98.8 F (37.1 C)] 97.9 F (36.6 C) (02/28 0751) Pulse Rate:  [60-74] 72 (02/28 0751) Resp:  [14-18] 16 (02/28 0751) BP: (122-140)/(48-63) 140/62 (02/28 0751) SpO2:  [96 %-100 %] 100 % (02/28 0751)     Height: 6' (182.9 cm) Weight: 93 kg BMI (Calculated): 27.8   Intake/Output last 2 shifts:  No intake/output data recorded.   Physical Exam:  Constitutional: alert, cooperative and no distress  Respiratory: breathing non-labored at rest  Cardiovascular: regular rate and sinus rhythm  Gastrointestinal: Abdomen is soft, he is non-tender, non-distended, no rebound/guarding. He remains without peritonitis  Integumentary: Laparoscopic incisions are healing well; dermabond starting to flake off appropriately, no erythema or drainage   Labs:     Latest Ref Rng & Units 12/24/2022    2:39 AM 12/23/2022    2:47 AM 12/22/2022    4:43 AM  CBC  WBC 4.0 - 10.5 K/uL 12.7  11.7  8.0   Hemoglobin 13.0 - 17.0 g/dL 10.9  11.3  11.4   Hematocrit 39.0 - 52.0 % 32.5  33.6  34.7   Platelets 150 - 400 K/uL 173  195  179       Latest Ref Rng & Units 12/24/2022    2:39 AM 12/23/2022    2:47 AM 12/22/2022    4:43 AM  CMP  Glucose 70 - 99 mg/dL 73  92  119   BUN 8 - 23 mg/dL '11  12  12   '$ Creatinine 0.61 - 1.24 mg/dL 1.00  1.00  0.77   Sodium 135 - 145 mmol/L 138  138  138   Potassium 3.5 - 5.1 mmol/L 4.1  4.0  3.4   Chloride 98 -  111 mmol/L 109  108  107   CO2 22 - 32 mmol/L '22  24  25   '$ Calcium 8.9 - 10.3 mg/dL 8.1  8.0  8.5   Total Protein 6.5 - 8.1 g/dL 5.4  5.3  5.5   Total Bilirubin 0.3 - 1.2 mg/dL 1.5  1.3  0.9   Alkaline Phos 38 - 126 U/L 64  62  61   AST 15 - 41 U/L '14  16  18   '$ ALT 0 - 44 U/L '15  17  19     '$ Imaging studies: No new pertinent imaging studies   Assessment/Plan: 77 y.o. male found to have a mucosal tear of the duodenal during ERCP yesterday (02/26) which was immediately clipped, 9 days s/p robotic assisted laparoscopic cholecystectomy and lysis of adhesions    - He did have a bump in leukocytosis this morning again but he otherwise is doing well clinically, abdomen is benign, and he remains hemodynamically stable. Not sure what to make of leukocytosis. No fevers. For now, we will continue with conservative measures unless he were to clinically deteriorate. Will keep him strict NPO for today and proceed with UGI tomorrow (02/29). If he worsens or leukocytosis continues to climb  tomorrow, may get repeat CT Abdomen/Pelvis instead.    - Continue strict NPO  - Continue PPI - Continue IVF support - Continue IV Abx (Zosyn)   - Monitor abdominal examination - Pain control prn; antiemetics prn - Monitor bilirubinemia; mild - given perforation, not candidate for repeat ERCP   - Monitor leukocytosis; slightly worse this AM - Okay to mobilize - Further management per primary service; we will follow along   All of the above findings and recommendations were discussed with the patient, patient's family at bedside, and the medical team, and all of their questions were answered to their expressed satisfaction.  -- Edison Simon, PA-C  [facility] 12/24/2022, 10:01 AM M-F: 7am - 4pm

## 2022-12-25 ENCOUNTER — Inpatient Hospital Stay: Payer: Medicare Other

## 2022-12-25 DIAGNOSIS — D649 Anemia, unspecified: Secondary | ICD-10-CM

## 2022-12-25 DIAGNOSIS — K269 Duodenal ulcer, unspecified as acute or chronic, without hemorrhage or perforation: Secondary | ICD-10-CM

## 2022-12-25 LAB — CBC
HCT: 30.2 % — ABNORMAL LOW (ref 39.0–52.0)
Hemoglobin: 10.2 g/dL — ABNORMAL LOW (ref 13.0–17.0)
MCH: 31 pg (ref 26.0–34.0)
MCHC: 33.8 g/dL (ref 30.0–36.0)
MCV: 91.8 fL (ref 80.0–100.0)
Platelets: 193 10*3/uL (ref 150–400)
RBC: 3.29 MIL/uL — ABNORMAL LOW (ref 4.22–5.81)
RDW: 12.6 % (ref 11.5–15.5)
WBC: 10.8 10*3/uL — ABNORMAL HIGH (ref 4.0–10.5)
nRBC: 0 % (ref 0.0–0.2)

## 2022-12-25 LAB — GLUCOSE, CAPILLARY: Glucose-Capillary: 91 mg/dL (ref 70–99)

## 2022-12-25 MED ORDER — PANTOPRAZOLE SODIUM 40 MG IV SOLR
40.0000 mg | Freq: Two times a day (BID) | INTRAVENOUS | Status: DC
  Administered 2022-12-26: 40 mg via INTRAVENOUS
  Filled 2022-12-25: qty 10

## 2022-12-25 MED ORDER — ENSURE ENLIVE PO LIQD
237.0000 mL | Freq: Three times a day (TID) | ORAL | Status: DC
  Administered 2022-12-25 – 2022-12-26 (×3): 237 mL via ORAL

## 2022-12-25 MED ORDER — ENSURE ENLIVE PO LIQD
237.0000 mL | Freq: Two times a day (BID) | ORAL | Status: DC
  Administered 2022-12-25: 237 mL via ORAL

## 2022-12-25 MED ORDER — ADULT MULTIVITAMIN W/MINERALS CH
1.0000 | ORAL_TABLET | Freq: Every day | ORAL | Status: DC
  Administered 2022-12-26: 1 via ORAL
  Filled 2022-12-25: qty 1

## 2022-12-25 MED ORDER — METOPROLOL SUCCINATE ER 50 MG PO TB24
50.0000 mg | ORAL_TABLET | Freq: Every day | ORAL | Status: DC
  Administered 2022-12-26: 50 mg via ORAL
  Filled 2022-12-25 (×2): qty 1

## 2022-12-25 MED ORDER — ATORVASTATIN CALCIUM 20 MG PO TABS
20.0000 mg | ORAL_TABLET | Freq: Every day | ORAL | Status: DC
  Administered 2022-12-25: 20 mg via ORAL
  Filled 2022-12-25: qty 1

## 2022-12-25 NOTE — Progress Notes (Addendum)
[place] [facility] Day(s): 4.   Interval History:  Patient seen and examined No acute events or new complaints overnight.  Maintaining hemodynamics Patient reports he is doing much better this AM No fever, chills, nausea, emesis  Leukocytosis improving; 10.8K He is on Zosyn Strict NPO  Vital signs in last 24 hours: [min-max] current  Temp:  [97.9 F (36.6 C)-98.4 F (36.9 C)] 98.2 F (36.8 C) (02/29 0403) Pulse Rate:  [59-72] 59 (02/29 0403) Resp:  [16-20] 20 (02/29 0403) BP: (132-154)/(56-71) 137/71 (02/29 0403) SpO2:  [97 %-100 %] 97 % (02/29 0403)     Height: 6' (182.9 cm) Weight: 93 kg BMI (Calculated): 27.8   Intake/Output last 2 shifts:  02/28 0701 - 02/29 0700 In: 240 [P.O.:240] Out: -    Physical Exam:  Constitutional: alert, cooperative and no distress  Respiratory: breathing non-labored at rest  Cardiovascular: regular rate and sinus rhythm  Gastrointestinal: Abdomen is soft, he is non-tender, non-distended, no rebound/guarding. He remains without peritonitis  Integumentary: Laparoscopic incisions are healing well; dermabond starting to flake off appropriately, no erythema or drainage   Labs:     Latest Ref Rng & Units 12/25/2022    4:36 AM 12/24/2022    2:39 AM 12/23/2022    2:47 AM  CBC  WBC 4.0 - 10.5 K/uL 10.8  12.7  11.7   Hemoglobin 13.0 - 17.0 g/dL 10.2  10.9  11.3   Hematocrit 39.0 - 52.0 % 30.2  32.5  33.6   Platelets 150 - 400 K/uL 193  173  195       Latest Ref Rng & Units 12/24/2022    2:39 AM 12/23/2022    2:47 AM 12/22/2022    4:43 AM  CMP  Glucose 70 - 99 mg/dL 73  92  119   BUN 8 - 23 mg/dL '11  12  12   '$ Creatinine 0.61 - 1.24 mg/dL 1.00  1.00  0.77   Sodium 135 - 145 mmol/L 138  138  138   Potassium 3.5 - 5.1 mmol/L 4.1  4.0  3.4   Chloride 98 - 111 mmol/L 109  108  107   CO2 22 - 32 mmol/L '22  24  25   '$ Calcium 8.9 - 10.3 mg/dL 8.1  8.0  8.5   Total Protein 6.5 - 8.1 g/dL 5.4  5.3  5.5    Total Bilirubin 0.3 - 1.2 mg/dL 1.5  1.3  0.9   Alkaline Phos 38 - 126 U/L 64  62  61   AST 15 - 41 U/L '14  16  18   '$ ALT 0 - 44 U/L '15  17  19     '$ Imaging studies: No new pertinent imaging studies   Assessment/Plan: 77 y.o. male found to have a mucosal tear of the duodenal during ERCP yesterday (02/26) which was immediately clipped, 10 days s/p robotic assisted laparoscopic cholecystectomy and lysis of adhesions    - Pending UGI this morning to re-evaluate duodenal perforation   - Continue strict NPO; pending UGI  - Continue PPI - Continue IVF support - Continue IV Abx (Zosyn)   - Monitor abdominal examination - Pain control prn; antiemetics prn - Monitor bilirubinemia; mild - given perforation, not candidate for repeat ERCP   - Monitor leukocytosis; improving - Okay to mobilize - Further management per primary service; we will follow along   All of the above findings and recommendations were discussed with the patient, patient's family  at bedside, and the medical team, and all of their questions were answered to their expressed satisfaction.  -- Edison Simon, PA-C [facility] 12/25/2022, 7:35 AM M-F: 7am - 4pm

## 2022-12-25 NOTE — Progress Notes (Addendum)
PATIENT RETURNED FROM DIAGNOSTIC TESTING IN STABLE CONDITION.  Drew Mcmahon [name] [name]

## 2022-12-25 NOTE — Care Management Important Message (Signed)
Important Message  Patient Details  Name: RINGO DONEGAN MRN: [id number] Date of Birth: 1946-05-10   Medicare Important Message Given:  Yes     Dannette Barbara 12/25/2022, 11:51 AM

## 2022-12-25 NOTE — Progress Notes (Signed)
Initial Nutrition Assessment  DOCUMENTATION CODES:   Severe malnutrition in context of acute illness/injury  INTERVENTION:   Ensure Enlive po TID, each supplement provides 350 kcal and 20 grams of protein.  MVI po daily   Pt at high refeed risk; recommend monitor potassium, magnesium and phosphorus labs daily until stable  NUTRITION DIAGNOSIS:   Severe Malnutrition related to acute illness as evidenced by 7 percent weight loss in 3 weeks, moderate fat depletion, moderate muscle depletion.  GOAL:   Patient will meet greater than or equal to 90% of their needs  MONITOR:   PO intake, Supplement acceptance, Diet advancement, Weight trends, Labs, I & O's, Skin  REASON FOR ASSESSMENT:   NPO/Clear Liquid Diet    ASSESSMENT:   77 y/o male with h/o GERD, HLD, HTN, BPH with outlet obstruction s/p laser enucleation 2019, DDD, spinal stenosis, SBO s/p benign diagnostic laparoscopy 04/01/17 followed by return of symptoms reguiring exploratory laparotomy 04/03/17 (with lysis of adhesions, appendectomy, focal small bowel resection (~12cm terminal ileum), ventral hernia s/p mesh repair 2020, choledocholithiasis and CCC s/p cholecystectomy and LOA 12/15/22 who presented with abdominal pain.  -Pt s/p ERCP 2/26 which showed LA grade C reflux esophagitis, normal stomach and nonbleeding duodenal ulcer. ERCP was aborted early due to duodenal ulcer and possible endoscopic mucosal injury that was clipped   Met with pt and wife in room today. Pt reports decreased oral intake for the past several weeks r/t abdominal pain, nausea and vomiting. Pt reports a recent 20lb weight loss. Per chart, pt is down 15lbs(7%) over the past 3 weeks; this is significant weight loss. Pt has been on NPO/clear liquid diet since admission. Pt s/p UGIS today with no leak noted. Pt has been initiated on a clear liquid diet with Ensure supplements. Pt reports that he is not hungry today. Pt reports that he did have some broth today  and that he drank an strawberry Ensure. RD discussed with pt the importance of adequate nutrition needed to preserve lean muscle. Recommend continue supplements to help pt meet his estimated needs. Pt is at high refeed risk. Recommend TPN if patient in unable to advance past clear liquid diet in the next 24-48hrs.   Medications reviewed and include: protonix, NaCl w/ Kcl '@50ml'$ /hr, zosyn   Labs reviewed: K 4.1 wnl, Mg 1.9 wnl- 2/28 P 3.4 wnl- 2/27 Wbc- 10.8(H), Hgb 10.2(L), Hct 30.2(L) Cbgs- 91, 181, 69, 76 x 48 hrs   NUTRITION - FOCUSED PHYSICAL EXAM:  Flowsheet Row Most Recent Value  Orbital Region Moderate depletion  Upper Arm Region Moderate depletion  Thoracic and Lumbar Region Moderate depletion  Buccal Region Mild depletion  Temple Region Mild depletion  Clavicle Bone Region Moderate depletion  Clavicle and Acromion Bone Region Moderate depletion  Scapular Bone Region Moderate depletion  Dorsal Hand Severe depletion  Patellar Region Moderate depletion  Anterior Thigh Region Moderate depletion  Posterior Calf Region Moderate depletion  Edema (RD Assessment) None  Hair Reviewed  Eyes Reviewed  Mouth Reviewed  Skin Reviewed  Nails Reviewed   Diet Order:   Diet Order             Diet clear liquid Fluid consistency: Thin  Diet effective now                  EDUCATION NEEDS:   Education needs have been addressed  Skin:  Skin Assessment: Reviewed RN Assessment  Last BM:  2/29  Height:   Ht Readings from Last 1 Encounters:  12/22/22 6' (1.829 m)    Weight:   Wt Readings from Last 1 Encounters:  12/22/22 93 kg    Ideal Body Weight:  80.9 kg  BMI:  Body mass index is 27.8 kg/m.  Estimated Nutritional Needs:   Kcal:  2000-2300kcal/day  Protein:  100-115g/day  Fluid:  2.0-2.3L/day  Koleen Distance MS, RD, LDN Please refer to [facility] for RD and/or RD on-call/weekend/after hours pager

## 2022-12-25 NOTE — Progress Notes (Signed)
PROGRESS NOTE  Drew Mcmahon [id number] DOB: 10-Dec-1945   PCP: Jon Billings, NP  Patient is from: Home  DOA: 12/20/2022 LOS: 4  Chief complaints Chief Complaint  Patient presents with   Emesis     Brief Narrative / Interim history: 77 year old M with PMH of HTN, HLD and recent laparoscopic cholecystectomy on 2/19 presenting with worsening abdominal pain, nausea and vomiting, and admitted for intractable abdominal pain, nausea and vomiting.  Vital stable.  Labs without significant finding.  CT concerning for duodenitis versus PUD.  GI consulted and recommended MRCP that is concerning for choledocholithiasis and duodenal ulcer versus inflammation of duodenal diverticulum.  Patient went for ERCP on 2/26 that showed LA grade C reflux esophagitis, normal stomach and nonbleeding duodenal ulcer with no stigmata of bleeding.  ERCP was aborted early due to duodenal ulcer and possible endoscopic mucosal injury that was clipped.  General surgery consulted.  Patient was started on IV Zosyn.  CT abdomen and pelvis with contrast showed free intraperitoneal air in RUQ and right retroperitoneum as well as questionable contrast extravasation from third portion of duodenum versus duodenal diverticulum.   General surgery recommended strict n.p.o. . UGIS  without  extraluminal contrast was seen to suggest a duodenal leak but persistent extraluminal gas along the first portion of duodenum.  Started on clear liquid diet.  General surgery following.    Subjective: Seen and examined earlier this morning.  No major events overnight of this morning.  No complaints.  No abdominal pain.  WBC down to 10.8.  Objective: Vitals:   12/24/22 2336 12/25/22 0403 12/25/22 0835 12/25/22 1130  BP: (!) 132/56 137/71 (!) 145/73 (!) 140/73  Pulse: 64 (!) 59 61 61  Resp:  20    Temp:  98.2 F (36.8 C) 98.3 F (36.8 C)   TempSrc:   Oral   SpO2:  97% 99% 100%  Weight:      Height:        Examination:  GENERAL:  No apparent distress.  Nontoxic. HEENT: MMM.  Vision and hearing grossly intact.  NECK: Supple.  No apparent JVD.  RESP:  No IWOB.  Fair aeration bilaterally. CVS:  RRR. Heart sounds normal.  ABD/GI/GU: BS+.  Abdomen soft.  Mild RUQ tenderness.  Laparoscopic wounds healing. MSK/EXT:   No apparent deformity. Moves extremities. No edema.  SKIN: no apparent skin lesion or wound NEURO: Awake and alert. Oriented appropriately.  No apparent focal neuro deficit. PSYCH: Calm. Normal affect.   Procedures:  2/26-ERCP showed LA grade C reflux esophagitis, normal stomach and nonbleeding duodenal ulcer with no stigmata of bleeding.  ERCP was aborted early due to duodenal ulcer and possible endoscopic mucosal injury that was clipped  Microbiology summarized: None  Assessment and plan: Principal Problem:   Abdominal pain Active Problems:   Duodenitis   Hypokalemia   Hyperlipidemia   Hypertension   Choledocholithiasis   Duodenal ulcer   Reflux esophagitis   Duodenal perforation (HCC)   Normocytic anemia  Reflux esophagitis/duodenal ulcer/possible duodenal perforation:  no signs and symptoms of peritonitis.  Appears well clinically.  Mild leukocytosis improving. UGIS  without  extraluminal contrast was seen to suggest a duodenal leak but persistent extraluminal gas along the first portion of duodenum.   -General surgery following-started clear liquid diet.  Continue Zosyn and IVF. -Started on clear liquid diet.  Abdominal pain/nausea/vomiting: Had recent lap chole.  LFT within normal except for mild hyperbilirubinemia.  Imaging raised concern for choledocholithiasis and possible duodenal ulcer.  No signs of biliary leak on MRCP.  LFT and lipase within normal.  ERCP as above. -Management as above.  Essential hypertension: BP within acceptable range. -Resume home Toprol-XL. -Continue holding Diovan/HCT  Normocytic anemia: No evidence of bleeding.  Initial drop likely dilutional.  No iron  deficiency on anemia panel.  Now stable. Recent Labs    12/01/22 1402 12/09/22 0911 12/20/22 1150 12/21/22 0450 12/22/22 0443 12/23/22 0247 12/24/22 0239 12/25/22 0436  HGB 15.8 15.0 14.1 11.2* 11.4* 11.3* 10.9* 10.2*  -Continue monitoring   Hypokalemia: -Monitor replenish as appropriate   Hyperlipidemia -Resume Lipitor     Body mass index is 27.8 kg/m.          DVT prophylaxis:  SCDs Start: 12/20/22 1657  Code Status: Full code Family Communication: None at bedside. Level of care: Med-Surg Status is: Inpatient The patient will remain inpatient because: Possible duodenal perforation   Final disposition: Home once medically cleared Consultants:  GI General surgery  35 minutes with more than 50% spent in reviewing records, counseling patient/family and coordinating care.   Sch Meds:  Scheduled Meds:  feeding supplement  237 mL Oral BID BM   metoprolol tartrate  2.5 mg Intravenous Q6H    Continuous Infusions:  0.9 % NaCl with KCl 20 mEq / L 50 mL/hr at 12/25/22 1326   pantoprazole 8 mg/hr (12/25/22 0411)   piperacillin-tazobactam 3.375 g (12/25/22 1327)   PRN Meds:.acetaminophen **OR** acetaminophen, bisacodyl, dextrose, hydrALAZINE, morphine injection, ondansetron (ZOFRAN) IV  Antimicrobials: Anti-infectives (From admission, onward)    Start     Dose/Rate Route Frequency Ordered Stop   12/22/22 2145  piperacillin-tazobactam (ZOSYN) IVPB 3.375 g        3.375 g 12.5 mL/hr over 240 Minutes Intravenous Every 8 hours 12/22/22 2133          I have personally reviewed the following labs and images: CBC: Recent Labs  Lab 12/21/22 0450 12/22/22 0443 12/23/22 0247 12/24/22 0239 12/25/22 0436  WBC 8.1 8.0 11.7* 12.7* 10.8*  HGB 11.2* 11.4* 11.3* 10.9* 10.2*  HCT 33.6* 34.7* 33.6* 32.5* 30.2*  MCV 90.6 90.6 92.1 91.8 91.8  PLT 185 179 195 173 193   BMP &GFR Recent Labs  Lab 12/20/22 1150 12/21/22 0450 12/22/22 0443 12/23/22 0247  12/24/22 0239  NA 138 138 138 138 138  K 3.4* 3.7 3.4* 4.0 4.1  CL 99 108 107 108 109  CO2 '25 24 25 24 22  '$ GLUCOSE 172* 102* 119* 92 73  BUN '18 19 12 12 11  '$ CREATININE 0.91 0.80 0.77 1.00 1.00  CALCIUM 9.2 8.2* 8.5* 8.0* 8.1*  MG 2.0  --  1.9 1.9 1.9  PHOS  --   --  2.9 3.4  --    Estimated Creatinine Clearance: 69 mL/min (by C-G formula based on SCr of 1 mg/dL). Liver & Pancreas: Recent Labs  Lab 12/20/22 1150 12/22/22 0443 12/23/22 0247 12/24/22 0239  AST '28 18 16 '$ 14*  ALT '24 19 17 15  '$ ALKPHOS 73 61 62 64  BILITOT 1.4* 0.9 1.3* 1.5*  PROT 7.4 5.5* 5.3* 5.4*  ALBUMIN 3.4* 2.5* 2.5* 2.5*   Recent Labs  Lab 12/20/22 1150 12/23/22 0247  LIPASE 27 33   No results for input(s): "AMMONIA" in the last 168 hours. Diabetic: No results for input(s): "HGBA1C" in the last 72 hours. Recent Labs  Lab 12/23/22 0757 12/24/22 0759 12/24/22 2036 12/24/22 2048 12/25/22 0830  GLUCAP 92 76 69* 181* 91   Cardiac Enzymes: No  results for input(s): "CKTOTAL", "CKMB", "CKMBINDEX", "TROPONINI" in the last 168 hours. No results for input(s): "PROBNP" in the last 8760 hours. Coagulation Profile: Recent Labs  Lab 12/20/22 1150  INR 1.2   Thyroid Function Tests: No results for input(s): "TSH", "T4TOTAL", "FREET4", "T3FREE", "THYROIDAB" in the last 72 hours. Lipid Profile: No results for input(s): "CHOL", "HDL", "LDLCALC", "TRIG", "CHOLHDL", "LDLDIRECT" in the last 72 hours. Anemia Panel: No results for input(s): "VITAMINB12", "FOLATE", "FERRITIN", "TIBC", "IRON", "RETICCTPCT" in the last 72 hours.  Urine analysis:    Component Value Date/Time   COLORURINE AMBER (A) 12/20/2022 1150   APPEARANCEUR CLEAR (A) 12/20/2022 1150   APPEARANCEUR Clear 12/09/2022 0910   LABSPEC 1.024 12/20/2022 1150   PHURINE 6.0 12/20/2022 1150   GLUCOSEU NEGATIVE 12/20/2022 1150   HGBUR NEGATIVE 12/20/2022 1150   BILIRUBINUR NEGATIVE 12/20/2022 1150   BILIRUBINUR Negative 12/09/2022 0910    KETONESUR 20 (A) 12/20/2022 1150   PROTEINUR 30 (A) 12/20/2022 1150   NITRITE NEGATIVE 12/20/2022 1150   LEUKOCYTESUR NEGATIVE 12/20/2022 1150   Sepsis Labs: Invalid input(s): "PROCALCITONIN", "LACTICIDVEN"  Microbiology: No results found for this or any previous visit (from the past 240 hour(s)).  Radiology Studies: DG UGI W SINGLE CM (SOL OR THIN BA)  Result Date: 12/25/2022 CLINICAL DATA:  Patient with history of recent ERCP with duodenal perforation and clipping, request received for upper GI to evaluate for perforation. EXAM: DG UGI W SINGLE CM TECHNIQUE: Single contrast examination was performed using Omnipaque 300. FLUOROSCOPY TIME:  Radiation Exposure Index (as provided by the fluoroscopic device): 43.30 mGy COMPARISON:  CT abdomen and pelvis December 22, 2022 FINDINGS: Single contrast examination performed with Omnipaque 300 of the esophagus, stomach and duodenum into the jejunum for evaluation of perforation. Scout radiograph fluoro saved images were taken prior to contrast administration. Vascular clips noted in the RIGHT upper quadrant. Persistent extraluminal gas along the first portion duodenum. In the upright and supine AP and oblique positions the patient was administered contrast by mouth. Normal opacification of the esophagus. Contrast empties into the stomach without delay. Fluoroscopic examination of the duodenum with normal opacification and no extraluminal contrast seen to suggest a perforation. IMPRESSION: 1. No extraluminal contrast was seen to suggest a duodenal leak. 2. Persistent extraluminal gas noted along the first portion duodenum. Similar to CT 12/22/2022 This exam was performed by Tsosie Billing PA-C, and was supervised and interpreted by Dr. Leonia Reeves. Electronically Signed   By: Suzy Bouchard M.D.   On: 12/25/2022 11:42      Taja Pentland T. [place]  If 7PM-7AM, please contact night-coverage [email or web address] 12/25/2022, 1:31 PM

## 2022-12-26 DIAGNOSIS — E43 Unspecified severe protein-calorie malnutrition: Secondary | ICD-10-CM

## 2022-12-26 LAB — CBC
HCT: 30.6 % — ABNORMAL LOW (ref 39.0–52.0)
Hemoglobin: 10.3 g/dL — ABNORMAL LOW (ref 13.0–17.0)
MCH: 30.7 pg (ref 26.0–34.0)
MCHC: 33.7 g/dL (ref 30.0–36.0)
MCV: 91.1 fL (ref 80.0–100.0)
Platelets: 186 10*3/uL (ref 150–400)
RBC: 3.36 MIL/uL — ABNORMAL LOW (ref 4.22–5.81)
RDW: 12.7 % (ref 11.5–15.5)
WBC: 8.9 10*3/uL (ref 4.0–10.5)
nRBC: 0 % (ref 0.0–0.2)

## 2022-12-26 LAB — RENAL FUNCTION PANEL
Albumin: 2.3 g/dL — ABNORMAL LOW (ref 3.5–5.0)
Anion gap: 7 (ref 5–15)
BUN: 7 mg/dL — ABNORMAL LOW (ref 8–23)
CO2: 23 mmol/L (ref 22–32)
Calcium: 8 mg/dL — ABNORMAL LOW (ref 8.9–10.3)
Chloride: 108 mmol/L (ref 98–111)
Creatinine, Ser: 0.87 mg/dL (ref 0.61–1.24)
GFR, Estimated: >60 mL/min (ref >60)
Glucose, Bld: 161 mg/dL — ABNORMAL HIGH (ref 70–99)
Phosphorus: 1.9 mg/dL — ABNORMAL LOW (ref 2.5–4.6)
Potassium: 3.7 mmol/L (ref 3.5–5.1)
Sodium: 138 mmol/L (ref 135–145)

## 2022-12-26 LAB — HEPATIC FUNCTION PANEL
ALT: 13 U/L (ref 0–44)
AST: 14 U/L — ABNORMAL LOW (ref 15–41)
Albumin: 2.3 g/dL — ABNORMAL LOW (ref 3.5–5.0)
Alkaline Phosphatase: 55 U/L (ref 38–126)
Bilirubin, Direct: 0.2 mg/dL (ref 0.0–0.2)
Indirect Bilirubin: 0.5 mg/dL (ref 0.3–0.9)
Total Bilirubin: 0.7 mg/dL (ref 0.3–1.2)
Total Protein: 5.3 g/dL — ABNORMAL LOW (ref 6.5–8.1)

## 2022-12-26 LAB — MAGNESIUM: Magnesium: 2 mg/dL (ref 1.7–2.4)

## 2022-12-26 MED ORDER — AMOXICILLIN-POT CLAVULANATE 875-125 MG PO TABS: 1.0000 | ORAL_TABLET | Freq: Two times a day (BID) | ORAL | 0 refills | Status: AC

## 2022-12-26 MED ORDER — POTASSIUM PHOSPHATES 15 MMOLE/5ML IV SOLN
30.0000 mmol | Freq: Once | INTRAVENOUS | Status: AC
  Administered 2022-12-26: 30 mmol via INTRAVENOUS
  Filled 2022-12-26: qty 10

## 2022-12-26 MED ORDER — PANTOPRAZOLE SODIUM 40 MG PO TBEC: 40.0000 mg | DELAYED_RELEASE_TABLET | Freq: Two times a day (BID) | ORAL | 1 refills | Status: DC

## 2022-12-26 NOTE — Discharge Instructions (Signed)
In addition to included general post-operative instructions,  Diet: Recommend following full liquid diet for another 24-48 hours and then transitioning to soft/mushy diet for another few days before resuming regular diet. I will leave handouts regarding this.   Activity: No heavy lifting >20 pounds (children, pets, laundry, garbage) or strenuous activity for 4 weeks from date of surgery, but light activity and walking are encouraged. Do not drive or drink alcohol if taking narcotic pain medications or having pain that might distract from driving.  Wound care: You may shower/get incision wet with soapy water and pat dry (do not rub incisions), but no baths or submerging incision underwater until follow-up.   Medications: Resume all home medications. For mild to moderate pain: acetaminophen (Tylenol) or ibuprofen/naproxen (if no kidney disease). Combining Tylenol with alcohol can substantially increase your risk of causing liver disease. Narcotic pain medications, if prescribed, can be used for severe pain, though may cause nausea, constipation, and drowsiness. Do not combine Tylenol and Percocet (or similar) within a 6 hour period as Percocet (and similar) contain(s) Tylenol. If you do not need the narcotic pain medication, you do not need to fill the prescription.  Call office [phone] / [phone]) at any time if any questions, worsening pain, fevers/chills, bleeding, drainage from incision site, or other concerns.

## 2022-12-26 NOTE — Progress Notes (Signed)
[facility] Day(s): 5.   Interval History:  Patient seen and examined No acute events or new complaints overnight.  Maintaining hemodynamics Labs are reassuring; leukocytosis resolved; hyperbilirubinemia resolved  Patient reports he is doing well; no issues No fever, chills, nausea, emesis  He is on Zosyn On FLD; tolerating   Vital signs in last 24 hours: [min-max] current  Temp:  [98.1 F (36.7 C)-98.4 F (36.9 C)] 98.1 F (36.7 C) (03/01 0504) Pulse Rate:  [53-61] 61 (03/01 0504) Resp:  [18] 18 (03/01 0504) BP: (132-145)/(62-92) 132/92 (03/01 0504) SpO2:  [99 %-100 %] 100 % (03/01 0504) Weight:  [99.1 kg] 99.1 kg (03/01 0501)     Height: 6' (182.9 cm) Weight: 99.1 kg BMI (Calculated): 29.62   Intake/Output last 2 shifts:  02/29 0701 - 03/01 0700 In: 960 [P.O.:960] Out: -    Physical Exam:  Constitutional: alert, cooperative and no distress  Respiratory: breathing non-labored at rest  Cardiovascular: regular rate and sinus rhythm  Gastrointestinal: Abdomen is soft, he is non-tender, non-distended, no rebound/guarding. He remains without peritonitis  Integumentary: Laparoscopic incisions are healing well; dermabond starting to flake off appropriately, no erythema or drainage   Labs:     Latest Ref Rng & Units 12/26/2022    2:54 AM 12/25/2022    4:36 AM 12/24/2022    2:39 AM  CBC  WBC 4.0 - 10.5 K/uL 8.9  10.8  12.7   Hemoglobin 13.0 - 17.0 g/dL 10.3  10.2  10.9   Hematocrit 39.0 - 52.0 % 30.6  30.2  32.5   Platelets 150 - 400 K/uL 186  193  173       Latest Ref Rng & Units 12/26/2022    2:55 AM 12/26/2022    2:54 AM 12/24/2022    2:39 AM  CMP  Glucose 70 - 99 mg/dL 161   73   BUN 8 - 23 mg/dL 7   11   Creatinine 0.61 - 1.24 mg/dL 0.87   1.00   Sodium 135 - 145 mmol/L 138   138   Potassium 3.5 - 5.1 mmol/L 3.7   4.1   Chloride 98 - 111 mmol/L 108   109   CO2 22 - 32 mmol/L 23   22   Calcium 8.9 - 10.3 mg/dL 8.0    8.1   Total Protein 6.5 - 8.1 g/dL  5.3  5.4   Total Bilirubin 0.3 - 1.2 mg/dL  0.7  1.5   Alkaline Phos 38 - 126 U/L  55  64   AST 15 - 41 U/L  14  14   ALT 0 - 44 U/L  13  15     Imaging studies: No new pertinent imaging studies   Assessment/Plan: 77 y.o. male found to have a mucosal tear of the duodenal during ERCP yesterday (02/26) which was immediately clipped, 11 days s/p robotic assisted laparoscopic cholecystectomy and lysis of adhesions    - Okay for FLD this AM  - Continue PPI - Will need this for home BID - Continue IVF support - Continue IV Abx (Zosyn); PO for 3 days at home to complete 7 days total    - Monitor abdominal examination - Pain control prn; antiemetics prn - Monitor bilirubinemia; resolved  - Monitor leukocytosis; resolved - Okay to mobilize - Further management per primary service  - Discharge Planning: Okay for discharge from surgical perspective. Will do FLD at home for a few days and  transition to soft diet. PPI and Abx as above. We will follow up with him in ~2 weeks for recheck    All of the above findings and recommendations were discussed with the patient, patient's family at bedside, and the medical team, and all of their questions were answered to their expressed satisfaction.  -- Edison Simon, PA-C [facility] 12/26/2022, 7:14 AM M-F: 7am - 4pm

## 2022-12-26 NOTE — Discharge Summary (Signed)
Physician Discharge Summary  Drew Mcmahon [id number] DOB: 23-Apr-1946 DOA: 12/20/2022  PCP: Jon Billings, NP  Admit date: 12/20/2022 Discharge date: 12/26/2022 Admitted From: Home Disposition: Home Recommendations for Outpatient Follow-up:  Follow up as below. Check CMP and CBC at follow-up Please follow up on the following pending results: None  Home Health: Not indicated Equipment/Devices: Not indicated  Discharge Condition: Stable CODE STATUS: Full code  Follow-up Information     Ronny Bacon, MD. Go on 01/08/2023.   Specialty: General Surgery Why: s/p lap chole with re-admission for ERCP and complication. Go at 1:30pm. Contact information: [address] [place] [address] [phone]         Jon Billings, NP. Go on 01/05/2023.   Specialty: Nurse Practitioner Why: Go at 3:10pm. Contact information: [place] [address] [facility] 77 year old M with PMH of HTN, HLD and recent laparoscopic cholecystectomy on 2/19 presenting with worsening abdominal pain, nausea and vomiting, and admitted for intractable abdominal pain, nausea and vomiting.  Vital stable.  Labs without significant finding.  CT concerning for duodenitis versus PUD.  GI consulted and recommended MRCP that is concerning for choledocholithiasis and duodenal ulcer versus inflammation of duodenal diverticulum.   Patient went for ERCP on 2/26 that showed LA grade C reflux esophagitis, normal stomach and nonbleeding duodenal ulcer with no stigmata of bleeding.  ERCP was aborted early due to duodenal ulcer and possible endoscopic mucosal injury that was clipped.  General surgery consulted.  Patient was started on IV Zosyn.  CT abdomen and pelvis with contrast showed free intraperitoneal air in RUQ and right retroperitoneum as well as questionable contrast extravasation from third portion of duodenum versus duodenal  diverticulum.   General surgery recommended strict n.p.o. . UGIS  without  extraluminal contrast was seen to suggest a duodenal leak but persistent extraluminal gas along the first portion of duodenum.  Started on clear liquid diet and advanced to full liquid diet.  He is cleared for discharge by general surgery.  He will continue full liquid diet for 3 to 4 days and advance to soft diet after that.  Surgery issued prescription for Augmentin and Protonix.  Outpatient follow-up as above.  See individual problem list below for more.   Problems addressed during this hospitalization Principal Problem:   Abdominal pain Active Problems:   Duodenitis   Hypokalemia   Hyperlipidemia   Hypertension   Choledocholithiasis   Duodenal ulcer   Reflux esophagitis   Duodenal perforation (HCC)   Normocytic anemia   Protein-calorie malnutrition, severe   Reflux esophagitis/duodenal ulcer/possible duodenal perforation:  no signs and symptoms of peritonitis.  Appears well clinically.  Mild leukocytosis improving. UGIS  without  extraluminal contrast was seen to suggest a duodenal leak but persistent extraluminal gas along the first portion of duodenum.   -Received IV Zosyn for 4 days in-house and discharged on p.o. Augmentin per general surgery -Protonix 40 mg twice daily -Full liquid diet for 3 to 4 days followed by soft diet -Discontinued low-dose aspirin.   Abdominal pain/nausea/vomiting: Had recent lap chole.  LFT within normal except for mild hyperbilirubinemia.  Imaging raised concern for choledocholithiasis and possible duodenal ulcer.   No signs of biliary leak on MRCP.  LFT and lipase within normal.  ERCP as above. -Management as above.   Essential hypertension: BP within acceptable range. -Continue home meds   Normocytic  anemia: No evidence of bleeding.  Initial drop likely dilutional.  No iron deficiency on anemia panel.  Now stable. -Recheck CBC at follow-up    Hypokalemia/hypophosphatemia:  Replenished prior to discharge   Hyperlipidemia -Continue home Lipitor  Severe malnutrition: Nutrition Problem: Severe Malnutrition Etiology: acute illness Signs/Symptoms: percent weight loss, moderate fat depletion, moderate muscle depletion Percent weight loss: 7 %       Vital signs Vitals:   12/25/22 2120 12/26/22 0501 12/26/22 0504 12/26/22 0826  BP: (!) 140/72  (!) 132/92 135/89  Pulse: (!) 58  61 73  Temp: 98.1 F (36.7 C)  98.1 F (36.7 C) 97.9 F (36.6 C)  Resp: '18  18 16  '$ Height:      Weight:  99.1 kg    SpO2: 100%  100% 99%  TempSrc: Oral  Oral Oral  BMI (Calculated):  29.62       Discharge exam  GENERAL: No apparent distress.  Nontoxic. HEENT: MMM.  Vision and hearing grossly intact.  NECK: Supple.  No apparent JVD.  RESP:  No IWOB.  Fair aeration bilaterally. CVS:  RRR. Heart sounds normal.  ABD/GI/GU: BS+. Abd soft, NTND.  MSK/EXT:  Moves extremities. No apparent deformity. No edema.  SKIN: Healing laparoscopic wounds. NEURO: Awake and alert. Oriented appropriately.  No apparent focal neuro deficit. PSYCH: Calm. Normal affect.   Discharge Instructions Discharge Instructions     Call MD for:  extreme fatigue   Complete by: As directed    Call MD for:  persistant nausea and vomiting   Complete by: As directed    Call MD for:  severe uncontrolled pain   Complete by: As directed    Call MD for:  temperature >100.4   Complete by: As directed    Diet full liquid   Complete by: As directed    Discharge instructions   Complete by: As directed    It has been a pleasure taking care of you!  You were hospitalized due to nausea, vomiting and abdominal pain that seems to have improved.  There was concern about duodenal ulcer and duodenal injury.  You have been started on antibiotics and Protonix (acid reflux medication).  Please take your medications as prescribed.  Please review your new medication list and the directions on your medications before you  take them.  Continue full liquid diet and advance to soft as recommended by general surgery.  Avoid any over-the-counter pain medication other than plain Tylenol. Follow-up with your primary care doctor in 1 to 2 weeks or sooner if needed. Follow-up with general surgery and GI per their recommendation.   Take care,   Increase activity slowly   Complete by: As directed       Allergies as of 12/26/2022   No Known Allergies      Medication List     STOP taking these medications    aspirin EC 81 MG tablet   HYDROcodone-acetaminophen 5-325 MG tablet Commonly known as: NORCO/VICODIN   triamcinolone 0.025 % cream Commonly known as: KENALOG       TAKE these medications    acetaminophen 500 MG tablet Commonly known as: TYLENOL Take 1,000 mg by mouth every 6 (six) hours as needed.   amoxicillin-clavulanate 875-125 MG tablet Commonly known as: AUGMENTIN Take 1 tablet by mouth 2 (two) times daily for 3 days.   atorvastatin 20 MG tablet Commonly known as: LIPITOR TAKE 1 TABLET BY MOUTH AT  BEDTIME   metoprolol succinate 50 MG 24 hr tablet  Commonly known as: TOPROL-XL TAKE 1 TABLET BY MOUTH ONCE  DAILY TAKE WITH OR IMMEDIATELY  FOLLOWING A MEAL   ONE-A-DAY MENS VITACRAVES PO Take 1 tablet by mouth daily.   pantoprazole 40 MG tablet Commonly known as: PROTONIX Take 1 tablet (40 mg total) by mouth 2 (two) times daily.   STOOL SOFTENER PO Take 1 capsule by mouth in the morning and at bedtime.   valsartan-hydrochlorothiazide 160-25 MG tablet Commonly known as: DIOVAN-HCT TAKE 1 TABLET BY MOUTH DAILY        Consultations: Gastroenterology General surgery  Procedures/Studies: 2/26-ERCP showed LA grade C reflux esophagitis, normal stomach and nonbleeding duodenal ulcer with no stigmata of bleeding.  ERCP was aborted early due to duodenal ulcer and possible endoscopic mucosal injury that was clipped    DG UGI W SINGLE CM (SOL OR THIN BA)  Result Date:  12/25/2022 CLINICAL DATA:  Patient with history of recent ERCP with duodenal perforation and clipping, request received for upper GI to evaluate for perforation. EXAM: DG UGI W SINGLE CM TECHNIQUE: Single contrast examination was performed using Omnipaque 300. FLUOROSCOPY TIME:  Radiation Exposure Index (as provided by the fluoroscopic device): 43.30 mGy COMPARISON:  CT abdomen and pelvis December 22, 2022 FINDINGS: Single contrast examination performed with Omnipaque 300 of the esophagus, stomach and duodenum into the jejunum for evaluation of perforation. Scout radiograph fluoro saved images were taken prior to contrast administration. Vascular clips noted in the RIGHT upper quadrant. Persistent extraluminal gas along the first portion duodenum. In the upright and supine AP and oblique positions the patient was administered contrast by mouth. Normal opacification of the esophagus. Contrast empties into the stomach without delay. Fluoroscopic examination of the duodenum with normal opacification and no extraluminal contrast seen to suggest a perforation. IMPRESSION: 1. No extraluminal contrast was seen to suggest a duodenal leak. 2. Persistent extraluminal gas noted along the first portion duodenum. Similar to CT 12/22/2022 This exam was performed by Tsosie Billing PA-C, and was supervised and interpreted by Dr. Leonia Reeves. Electronically Signed   By: Suzy Bouchard M.D.   On: 12/25/2022 11:42   CT ABDOMEN PELVIS W CONTRAST  Addendum Date: 12/22/2022   ADDENDUM REPORT: 12/22/2022 21:44 ADDENDUM: These results were called by telephone at the time of interpretation on 12/22/2022 at 9:44 pm to provider Dr. Jaci Carrel, who verbally acknowledged these results. Electronically Signed   By: Ronney Asters M.D.   On: 12/22/2022 21:44   Result Date: 12/22/2022 CLINICAL DATA:  Status post duodenal ulcer and upper GI procedure. Mid abdominal pain. ERCP performed today. EXAM: CT ABDOMEN AND PELVIS WITH CONTRAST TECHNIQUE:  Multidetector CT imaging of the abdomen and pelvis was performed using the standard protocol following bolus administration of intravenous contrast. RADIATION DOSE REDUCTION: This exam was performed according to the departmental dose-optimization program which includes automated exposure control, adjustment of the mA and/or kV according to patient size and/or use of iterative reconstruction technique. CONTRAST:  140m OMNIPAQUE IOHEXOL 300 MG/ML  SOLN COMPARISON:  MRI abdomen 12/20/2022. CT abdomen and pelvis 12/20/2022. FINDINGS: Lower chest: Minimal atelectasis in the lung bases. Hepatobiliary: Patient is status post cholecystectomy. There is minimal stranding in the gallbladder fossa without focal fluid collection. There is no biliary ductal dilatation. No focal liver lesion identified. Pancreas: Unremarkable. No pancreatic ductal dilatation or surrounding inflammatory changes. Spleen: Normal in size without focal abnormality. Adrenals/Urinary Tract: Adrenal glands are unremarkable. No hydronephrosis or renal calculi. Bladder is unremarkable. There are left renal cysts measuring up to  16 mm. Stomach/Bowel: There are new surgical clips at the level of the proximal duodenal. There is wall thickening of the proximal duodenal with a moderate amount of free intraperitoneal air surrounding the duodenal and in the right upper quadrant. There is also minimal free air tracking along the right retroperitoneum. Questionable small amount of extravasated oral contrast seen along the posterior aspect of the third portion of the duodenal posteriorly image 2/34 versus small duodenal diverticulum. Sigmoid colon diverticula are present. Otherwise, stomach, small bowel and colon are within normal limits. The appendix is surgically absent. Vascular/Lymphatic: Aortic atherosclerosis. No enlarged abdominal or pelvic lymph nodes. Reproductive: Prostate is unremarkable. Other: There is a small right inguinal hernia containing fat and  fluid. There is also small amount of mildly hyperdense fluid in the pelvis. There is no focal abdominal wall hernia. There is some mild focal body wall edema in the left anterior mid abdomen. Musculoskeletal: No acute fracture. Degenerative changes affect the spine. Small sclerotic lesion in the T12 vertebral body is unchanged, possibly a bone island. IMPRESSION: 1. Moderate amount of free intraperitoneal air in the right upper quadrant and right retroperitoneum. Findings are concerning for perforated duodenum. There is a questionable small amount of extravasated oral contrast of the third portion of the duodenal versus small duodenal diverticulum. 2. Small amount of mildly hyperdense fluid in the pelvis may represent blood products. 3. Status post cholecystectomy with minimal stranding in the gallbladder fossa. No focal fluid collection. 4. Small right inguinal hernia containing fat and fluid. 5. Mild focal body wall edema in the left anterior mid abdomen. Aortic Atherosclerosis (ICD10-I70.0). Electronically Signed: By: Ronney Asters M.D. On: 12/22/2022 21:19   DG C-Arm 1-60 Min-No Report  Result Date: 12/22/2022 Fluoroscopy was utilized by the requesting physician.  No radiographic interpretation.   MR ABDOMEN MRCP W WO CONTAST  Result Date: 12/21/2022 CLINICAL DATA:  77 year old male with history of abdominal pain status post cholecystectomy. EXAM: MRI ABDOMEN WITHOUT AND WITH CONTRAST (INCLUDING MRCP) TECHNIQUE: Multiplanar multisequence MR imaging of the abdomen was performed both before and after the administration of intravenous contrast. Heavily T2-weighted images of the biliary and pancreatic ducts were obtained, and three-dimensional MRCP images were rendered by post processing. CONTRAST:  83m GADAVIST GADOBUTROL 1 MMOL/ML IV SOLN COMPARISON:  No prior abdominal MRI. CT of the abdomen and pelvis 12/20/2022. FINDINGS: Lower chest: Unremarkable. Hepatobiliary: No suspicious cystic or solid hepatic  lesions. Status post cholecystectomy. No unexpected postoperative fluid collection in the gallbladder fossa. No intra or extrahepatic biliary ductal dilatation noted on MRCP images. Common bile duct measures up to 6 mm in the porta hepatis. However, there is a 6 x 3 mm filling defect (axial image 66 of series 14) in the distal common bile duct, concerning for choledocholithiasis. Pancreas: No pancreatic mass. Mild inflammatory changes are noted adjacent to the head of the pancreas in the pancreaticoduodenal groove. No other inflammatory changes are noted adjacent to the remainder of the pancreas. No pancreatic ductal dilatation noted on MRCP images. No peripancreatic fluid collections. Spleen:  Unremarkable. Adrenals/Urinary Tract: Multiple renal lesions bilaterally, most of which are T1 hypointense and T2 hyperintense without enhancement, compatible with simple cysts (Bosniak class 1, no imaging follow-up recommended), many of which are parapelvic in location. In addition, in the left kidney there are 2 small lesions which are T1 hyperintense and T2 isointense without definite enhancement, compatible with proteinaceous/hemorrhagic cysts (Bosniak class 2, no imaging follow-up recommended), largest of which measures 9 mm in the upper  pole. No hydroureteronephrosis in the visualized portions of the abdomen. Bilateral adrenal glands are normal in appearance. Stomach/Bowel: In the region of the pancreatico duodenal groove there is a focal outpouching of the medial aspect of the second portion of the duodenal which demonstrates some rim enhancement best appreciated on axial image 44 of series 19, along with some surrounding inflammation. Whether this represents a duodenal ulcer or inflamed duodenal diverticulum is uncertain. More inferiorly there is a small duodenal diverticulum extending off the anterior aspect of the third portion of the duodenum, without surrounding inflammatory changes. Otherwise, unremarkable.  Vascular/Lymphatic: No aneurysm identified in the visualized abdominal vasculature. No lymphadenopathy noted in the abdomen. Other: No significant volume of ascites noted in the visualized portions of the peritoneal cavity. Musculoskeletal: No aggressive appearing osseous lesions are noted in the visualized portions of the skeleton. IMPRESSION: 1. Status post cholecystectomy. No unexpected postoperative fluid collection in the gallbladder fossa. 2. Tiny filling defect in the distal common bile duct concerning for choledocholithiasis. However, there is no evidence of biliary tract obstruction at this time. 3. Focal outpouching off the medial aspect of the second portion of the duodenum with surrounding inflammatory changes concerning for either small duodenal ulcer or inflamed diverticulum. Electronically Signed   By: Vinnie Langton M.D.   On: 12/21/2022 07:03   MR 3D Recon At Scanner  Result Date: 12/21/2022 CLINICAL DATA:  77 year old male with history of abdominal pain status post cholecystectomy. EXAM: MRI ABDOMEN WITHOUT AND WITH CONTRAST (INCLUDING MRCP) TECHNIQUE: Multiplanar multisequence MR imaging of the abdomen was performed both before and after the administration of intravenous contrast. Heavily T2-weighted images of the biliary and pancreatic ducts were obtained, and three-dimensional MRCP images were rendered by post processing. CONTRAST:  41m GADAVIST GADOBUTROL 1 MMOL/ML IV SOLN COMPARISON:  No prior abdominal MRI. CT of the abdomen and pelvis 12/20/2022. FINDINGS: Lower chest: Unremarkable. Hepatobiliary: No suspicious cystic or solid hepatic lesions. Status post cholecystectomy. No unexpected postoperative fluid collection in the gallbladder fossa. No intra or extrahepatic biliary ductal dilatation noted on MRCP images. Common bile duct measures up to 6 mm in the porta hepatis. However, there is a 6 x 3 mm filling defect (axial image 66 of series 14) in the distal common bile duct,  concerning for choledocholithiasis. Pancreas: No pancreatic mass. Mild inflammatory changes are noted adjacent to the head of the pancreas in the pancreaticoduodenal groove. No other inflammatory changes are noted adjacent to the remainder of the pancreas. No pancreatic ductal dilatation noted on MRCP images. No peripancreatic fluid collections. Spleen:  Unremarkable. Adrenals/Urinary Tract: Multiple renal lesions bilaterally, most of which are T1 hypointense and T2 hyperintense without enhancement, compatible with simple cysts (Bosniak class 1, no imaging follow-up recommended), many of which are parapelvic in location. In addition, in the left kidney there are 2 small lesions which are T1 hyperintense and T2 isointense without definite enhancement, compatible with proteinaceous/hemorrhagic cysts (Bosniak class 2, no imaging follow-up recommended), largest of which measures 9 mm in the upper pole. No hydroureteronephrosis in the visualized portions of the abdomen. Bilateral adrenal glands are normal in appearance. Stomach/Bowel: In the region of the pancreatico duodenal groove there is a focal outpouching of the medial aspect of the second portion of the duodenal which demonstrates some rim enhancement best appreciated on axial image 44 of series 19, along with some surrounding inflammation. Whether this represents a duodenal ulcer or inflamed duodenal diverticulum is uncertain. More inferiorly there is a small duodenal diverticulum extending off the  anterior aspect of the third portion of the duodenum, without surrounding inflammatory changes. Otherwise, unremarkable. Vascular/Lymphatic: No aneurysm identified in the visualized abdominal vasculature. No lymphadenopathy noted in the abdomen. Other: No significant volume of ascites noted in the visualized portions of the peritoneal cavity. Musculoskeletal: No aggressive appearing osseous lesions are noted in the visualized portions of the skeleton. IMPRESSION: 1.  Status post cholecystectomy. No unexpected postoperative fluid collection in the gallbladder fossa. 2. Tiny filling defect in the distal common bile duct concerning for choledocholithiasis. However, there is no evidence of biliary tract obstruction at this time. 3. Focal outpouching off the medial aspect of the second portion of the duodenum with surrounding inflammatory changes concerning for either small duodenal ulcer or inflamed diverticulum. Electronically Signed   By: Vinnie Langton M.D.   On: 12/21/2022 07:03   CT ABDOMEN PELVIS W CONTRAST  Result Date: 12/20/2022 CLINICAL DATA:  Abdominal pain. History of cholecystectomy on 12/15/2022 EXAM: CT ABDOMEN AND PELVIS WITH CONTRAST TECHNIQUE: Multidetector CT imaging of the abdomen and pelvis was performed using the standard protocol following bolus administration of intravenous contrast. RADIATION DOSE REDUCTION: This exam was performed according to the departmental dose-optimization program which includes automated exposure control, adjustment of the mA and/or kV according to patient size and/or use of iterative reconstruction technique. CONTRAST:  166m OMNIPAQUE IOHEXOL 300 MG/ML  SOLN COMPARISON:  CT 12/01/2022 FINDINGS: Lower chest: No acute abnormality. Hepatobiliary: No focal liver abnormality is seen. Interval cholecystectomy with mild stranding and trace fluid in the gallbladder fossa. No organized fluid collection. No biliary dilatation. Pancreas: Unremarkable. No pancreatic ductal dilatation or surrounding inflammatory changes. Spleen: Normal in size without focal abnormality. Adrenals/Urinary Tract: Unremarkable adrenal glands. Kidneys enhance symmetrically without solid lesion, stone, or hydronephrosis. Ureters are nondilated. Urinary bladder appears unremarkable for the degree of distention. Stomach/Bowel: Stomach within normal limits. Mild thickening of the second portion of the duodenum with mild adjacent fat stranding. There are a few  duodenal diverticula, largest measuring 4.7 cm in diameter along the second portion. No dilated loops of bowel. Scattered colonic diverticulosis. No focal colonic wall thickening or inflammatory changes. Appendix appears to be surgically absent. Vascular/Lymphatic: Aortic atherosclerosis. No enlarged abdominal or pelvic lymph nodes. Reproductive: Prostate is unremarkable. Other: Small volume of hyperdense free fluid within the dependent portion of the pelvis. No ascites. No organized abdominopelvic fluid collections. No pneumoperitoneum. Musculoskeletal: No new or acute bony findings. Prior left hip pinning. IMPRESSION: 1. Interval cholecystectomy with mild stranding and trace fluid in the gallbladder fossa. No organized fluid collection. 2. Mild thickening of the second portion of the duodenum with adjacent fat stranding. Findings may represent duodenitis versus peptic ulcer disease. There are a few duodenal diverticula, largest measuring 4.7 cm in diameter along the second portion. 3. Small volume of hyperdense free fluid within the dependent portion of the pelvis, likely representing a small amount of hemoperitoneum related to the recent intra-abdominal surgery. No organized abdominopelvic fluid collection or abscess. 4. Colonic diverticulosis without evidence of acute diverticulitis. 5. Aortic atherosclerosis (ICD10-I70.0). Electronically Signed   By: NDavina PokeD.O.   On: 12/20/2022 14:28   UKoreaAbdomen Limited RUQ (LIVER/GB)  Result Date: 12/01/2022 CLINICAL DATA:  Right upper quadrant pain. EXAM: ULTRASOUND ABDOMEN LIMITED RIGHT UPPER QUADRANT COMPARISON:  CT earlier today. FINDINGS: Gallbladder: Physiologically distended. Intraluminal layering sludge. There may be tiny echogenic stones. No gallbladder wall thickening. No sonographic Murphy sign noted by sonographer. Common bile duct: Diameter: 2 mm. Liver: No focal lesion identified. Portions  the left lobe are not well visualized by ultrasound. Within  normal limits in parenchymal echogenicity. Portal vein is patent on color Doppler imaging with normal direction of blood flow towards the liver. Other: No right upper quadrant ascites. IMPRESSION: 1. Gallbladder sludge and possible small stones. No sonographic findings of acute cholecystitis. 2. No biliary dilatation. Electronically Signed   By: Keith Rake M.D.   On: 12/01/2022 18:59   CT Abdomen Pelvis W Contrast  Result Date: 12/01/2022 CLINICAL DATA:  Five day history of nausea and lack of appetite with lower abdominal pain EXAM: CT ABDOMEN AND PELVIS WITH CONTRAST TECHNIQUE: Multidetector CT imaging of the abdomen and pelvis was performed using the standard protocol following bolus administration of intravenous contrast. RADIATION DOSE REDUCTION: This exam was performed according to the departmental dose-optimization program which includes automated exposure control, adjustment of the mA and/or kV according to patient size and/or use of iterative reconstruction technique. CONTRAST:  153m OMNIPAQUE IOHEXOL 300 MG/ML  SOLN COMPARISON:  CT abdomen and pelvis dated 03/30/2017 FINDINGS: Lower chest: No focal consolidation or pulmonary nodule in the lung bases. No pleural effusion or pneumothorax demonstrated. Partially imaged heart size is normal. Hepatobiliary: No focal hepatic lesions. No intra or extrahepatic biliary ductal dilation. Cholelithiasis. Pancreas: No focal lesions or main ductal dilation. Spleen: Normal in size without focal abnormality. Adrenals/Urinary Tract: No adrenal nodules. No suspicious renal mass, calculi or hydronephrosis. Subcentimeter left renal hypodensities, too small to characterize but unchanged from 03/30/2017, likely cysts. Left parapelvic. No focal bladder wall thickening. Stomach/Bowel: Small hiatal hernia. Duodenal diverticulum in the second portion. Postsurgical changes of right lower quadrant small bowel. Anastomosis is intact. No evidence of bowel wall thickening,  distention, or acute inflammatory changes. Submucosal fat attenuation within small bowel loops of the right lower quadrant, likely sequela of prior infection/inflammation. Colonic diverticulosis without acute diverticulitis. Status post appendectomy. Vascular/Lymphatic: Aortic atherosclerosis. No enlarged abdominal or pelvic lymph nodes. Reproductive: Prostate is unremarkable. Other: No free fluid, fluid collection, or free air. Musculoskeletal: Postsurgical changes from proximal left femoral fixation. Hardware appears intact. Unchanged grade 1 retrolisthesis at L4-5. Small fat-containing right inguinal hernia. Postsurgical changes of the anterior abdominal wall. IMPRESSION: 1. No acute abnormality in the abdomen or pelvis. 2. Colonic diverticulosis without acute diverticulitis. 3. Cholelithiasis without evidence of acute cholecystitis. 4. Small fat-containing right inguinal hernia. 5. Aortic Atherosclerosis (ICD10-I70.0). Electronically Signed   By: LDarrin NipperM.D.   On: 12/01/2022 17:48       The results of significant diagnostics from this hospitalization (including imaging, microbiology, ancillary and laboratory) are listed below for reference.     Microbiology: No results found for this or any previous visit (from the past 240 hour(s)).   Labs:  CBC: Recent Labs  Lab 12/22/22 0443 12/23/22 0247 12/24/22 0239 12/25/22 0436 12/26/22 0254  WBC 8.0 11.7* 12.7* 10.8* 8.9  HGB 11.4* 11.3* 10.9* 10.2* 10.3*  HCT 34.7* 33.6* 32.5* 30.2* 30.6*  MCV 90.6 92.1 91.8 91.8 91.1  PLT 179 195 173 193 186   BMP &GFR Recent Labs  Lab 12/20/22 1150 12/21/22 0450 12/22/22 0443 12/23/22 0247 12/24/22 0239 12/26/22 0254 12/26/22 0255  NA 138 138 138 138 138  --  138  K 3.4* 3.7 3.4* 4.0 4.1  --  3.7  CL 99 108 107 108 109  --  108  CO2 '25 24 25 24 22  '$ --  23  GLUCOSE 172* 102* 119* 92 73  --  161*  BUN 18 19 12  12 11  --  7*  CREATININE 0.91 0.80 0.77 1.00 1.00  --  0.87  CALCIUM 9.2 8.2*  8.5* 8.0* 8.1*  --  8.0*  MG 2.0  --  1.9 1.9 1.9 2.0  --   PHOS  --   --  2.9 3.4  --   --  1.9*   Estimated Creatinine Clearance: 88.1 mL/min (by C-G formula based on SCr of 0.87 mg/dL). Liver & Pancreas: Recent Labs  Lab 12/20/22 1150 12/22/22 0443 12/23/22 0247 12/24/22 0239 12/26/22 0254 12/26/22 0255  AST '28 18 16 '$ 14* 14*  --   ALT '24 19 17 15 13  '$ --   ALKPHOS 73 61 62 64 55  --   BILITOT 1.4* 0.9 1.3* 1.5* 0.7  --   PROT 7.4 5.5* 5.3* 5.4* 5.3*  --   ALBUMIN 3.4* 2.5* 2.5* 2.5* 2.3* 2.3*   Recent Labs  Lab 12/20/22 1150 12/23/22 0247  LIPASE 27 33   No results for input(s): "AMMONIA" in the last 168 hours. Diabetic: No results for input(s): "HGBA1C" in the last 72 hours. Recent Labs  Lab 12/23/22 0757 12/24/22 0759 12/24/22 2036 12/24/22 2048 12/25/22 0830  GLUCAP 92 76 69* 181* 91   Cardiac Enzymes: No results for input(s): "CKTOTAL", "CKMB", "CKMBINDEX", "TROPONINI" in the last 168 hours. No results for input(s): "PROBNP" in the last 8760 hours. Coagulation Profile: Recent Labs  Lab 12/20/22 1150  INR 1.2   Thyroid Function Tests: No results for input(s): "TSH", "T4TOTAL", "FREET4", "T3FREE", "THYROIDAB" in the last 72 hours. Lipid Profile: No results for input(s): "CHOL", "HDL", "LDLCALC", "TRIG", "CHOLHDL", "LDLDIRECT" in the last 72 hours. Anemia Panel: No results for input(s): "VITAMINB12", "FOLATE", "FERRITIN", "TIBC", "IRON", "RETICCTPCT" in the last 72 hours. Urine analysis:    Component Value Date/Time   COLORURINE AMBER (A) 12/20/2022 1150   APPEARANCEUR CLEAR (A) 12/20/2022 1150   APPEARANCEUR Clear 12/09/2022 0910   LABSPEC 1.024 12/20/2022 1150   PHURINE 6.0 12/20/2022 1150   GLUCOSEU NEGATIVE 12/20/2022 1150   HGBUR NEGATIVE 12/20/2022 1150   BILIRUBINUR NEGATIVE 12/20/2022 1150   BILIRUBINUR Negative 12/09/2022 0910   KETONESUR 20 (A) 12/20/2022 1150   PROTEINUR 30 (A) 12/20/2022 1150   NITRITE NEGATIVE 12/20/2022 1150    LEUKOCYTESUR NEGATIVE 12/20/2022 1150   Sepsis Labs: Invalid input(s): "PROCALCITONIN", "LACTICIDVEN"   SIGNED:  Mercy Riding, MD  [facility] 12/26/2022, 5:34 PM

## 2022-12-29 ENCOUNTER — Telehealth: Payer: Self-pay | Admitting: *Deleted

## 2022-12-29 ENCOUNTER — Other Ambulatory Visit: Payer: Self-pay | Admitting: Nurse Practitioner

## 2022-12-29 DIAGNOSIS — I10 Essential (primary) hypertension: Secondary | ICD-10-CM

## 2022-12-29 DIAGNOSIS — E785 Hyperlipidemia, unspecified: Secondary | ICD-10-CM

## 2022-12-29 NOTE — Transitions of Care (Post Inpatient/ED Visit) (Signed)
   12/29/2022  Name: Drew Mcmahon MRN: [id number] DOB: 17-Mar-1946  Today's TOC FU Call Status: Today's TOC FU Call Status:: Successful TOC FU Call Competed TOC FU Call Complete Date: 12/29/22  Transition Care Management Follow-up Telephone Call Date of Discharge: 12/26/22 Discharge Facility: [facility]) Type of Discharge: Inpatient Admission Primary Inpatient Discharge Diagnosis:: abd pain How have you been since you were released from the hospital?: Better (The pain is better but I am stoll weak) Any questions or concerns?: No  Items Reviewed: Did you receive and understand the discharge instructions provided?: Yes Medications obtained and verified?: Yes (Medications Reviewed) (I am taking my atbx) Any new allergies since your discharge?: No Dietary orders reviewed?: Yes (diet low sodium heart healthy , Patient is sticking to a soft diet for now also) Type of Diet Ordered:: low sodium heart healthy Do you have support at home?: Yes People in Home: spouse Name of Support/Comfort Primary Source: [facility] and Equipment/Supplies: [place] Ordered?: No Any new equipment or medical supplies ordered?: No  Functional Questionnaire: Do you need assistance with bathing/showering or dressing?: No Do you need assistance with meal preparation?: Yes Do you need assistance with eating?: No Do you need assistance with getting out of bed/getting out of a chair/moving?: No Do you have difficulty managing or taking your medications?: No  Folllow up appointments reviewed: PCP Follow-up appointment confirmed?: Yes Date of PCP follow-up appointment?: 01/05/23 Follow-up Provider: Jon Billings 3:10 [facility] Follow-up appointment confirmed?: Yes Date of Specialist follow-up appointment?: 01/08/23 Follow-Up Specialty Provider:: Ronny Bacon 1:30 Do you need transportation to your follow-up appointment?: No Do you understand care  options if your condition(s) worsen?: Yes-patient verbalized understanding  SDOH Interventions Today    Flowsheet Row Most Recent Value  SDOH Interventions   Food Insecurity Interventions Intervention Not Indicated  Housing Interventions Intervention Not Indicated  Transportation Interventions Intervention Not Indicated      Interventions Today    Flowsheet Row Most Recent Value  General Interventions   General Interventions Discussed/Reviewed Doctor Visits  Doctor Visits Discussed/Reviewed Doctor Visits Reviewed  Nutrition Interventions   Nutrition Discussed/Reviewed Nutrition Discussed  [RN discussed diet with no fried or spicy. Patient is currently also eating soft foods only that wife prepares.]       [facility] [phone]

## 2022-12-29 NOTE — Transitions of Care (Post Inpatient/ED Visit) (Signed)
   12/29/2022  Name: Drew Mcmahon MRN: [id number] DOB: 24-Feb-1946  Today's TOC FU Call Status: Today's TOC FU Call Status:: Unsuccessul Call (1st Attempt) Unsuccessful Call (1st Attempt) Date: 12/29/22  Attempted to reach the patient regarding the most recent Inpatient/ED visit.  Follow Up Plan: Additional outreach attempts will be made to reach the patient to complete the Transitions of Care (Post Inpatient/ED visit) call.   [facility] [phone]

## 2022-12-30 ENCOUNTER — Encounter: Payer: Medicare Other | Admitting: Surgery

## 2023-01-05 ENCOUNTER — Ambulatory Visit (INDEPENDENT_AMBULATORY_CARE_PROVIDER_SITE_OTHER): Payer: Medicare Other | Admitting: Nurse Practitioner

## 2023-01-05 ENCOUNTER — Encounter: Payer: Self-pay | Admitting: Nurse Practitioner

## 2023-01-05 VITALS — BP 127/63 | HR 46 | Temp 97.9°F | Wt 209.3 lb

## 2023-01-05 DIAGNOSIS — K269 Duodenal ulcer, unspecified as acute or chronic, without hemorrhage or perforation: Secondary | ICD-10-CM | POA: Diagnosis not present

## 2023-01-05 DIAGNOSIS — Z09 Encounter for follow-up examination after completed treatment for conditions other than malignant neoplasm: Secondary | ICD-10-CM

## 2023-01-05 DIAGNOSIS — K8050 Calculus of bile duct without cholangitis or cholecystitis without obstruction: Secondary | ICD-10-CM

## 2023-01-05 DIAGNOSIS — I10 Essential (primary) hypertension: Secondary | ICD-10-CM | POA: Diagnosis not present

## 2023-01-05 DIAGNOSIS — K631 Perforation of intestine (nontraumatic): Secondary | ICD-10-CM

## 2023-01-05 NOTE — Assessment & Plan Note (Signed)
Continue with Protonix.  Avoid NSAIDS.  CMP/CBC ordered at visit today.

## 2023-01-05 NOTE — Progress Notes (Signed)
BP 127/63 (BP Location: Left Arm, Cuff Size: Normal)   Pulse (!) 46   Temp 97.9 F (36.6 C) (Oral)   Wt 209 lb 4.8 oz (94.9 kg)   SpO2 98%   BMI 28.39 kg/m    Subjective:    Patient ID: Drew Mcmahon, male    DOB: Dec 08, 1945, 77 y.o.   MRN: [id number]  HPI: Drew Mcmahon is a 76 y.o. male  Chief Complaint  Patient presents with   Hospitalization Follow-up   Transition of [facility] Follow up.   Hospital/Facility: [facility] D/C Physician: Dr. Cyndia Skeeters D/C Date: 12/26/22  Records Requested: NA Records Received: Yes Records Reviewed: Yes  Diagnoses on Discharge:   Reflux esophagitis/duodenal ulcer/possible duodenal perforation:  no signs and symptoms of peritonitis.  Appears well clinically.  Mild leukocytosis improving. UGIS  without  extraluminal contrast was seen to suggest a duodenal leak but persistent extraluminal gas along the first portion of duodenum.   -Received IV Zosyn for 4 days in-house and discharged on p.o. Augmentin per general surgery -Protonix 40 mg twice daily -Full liquid diet for 3 to 4 days followed by soft diet -Discontinued low-dose aspirin.  Patient states he is feeling better.  States he isn't throwing up and doesn't have any pain.  He is still on a liquid diet.  See's General Surgery on Thursday.     Abdominal pain/nausea/vomiting: Had recent lap chole.  LFT within normal except for mild hyperbilirubinemia.  Imaging raised concern for choledocholithiasis and possible duodenal ulcer.   No signs of biliary leak on MRCP.  LFT and lipase within normal.  ERCP as above. -Management as above.   Essential hypertension: BP within acceptable range. -Continue home meds   Normocytic anemia: No evidence of bleeding.  Initial drop likely dilutional.  No iron deficiency on anemia panel.  Now stable. -Recheck CBC at follow-up    Hypokalemia/hypophosphatemia: Replenished prior to discharge   Hyperlipidemia -Continue home Lipitor   Date of interactive Contact  within 48 hours of discharge:  Contact was through: phone  Date of 7 day or 14 day face-to-face visit:    within 14 days  Outpatient Encounter Medications as of 01/05/2023  Medication Sig   acetaminophen (TYLENOL) 500 MG tablet Take 1,000 mg by mouth every 6 (six) hours as needed.   atorvastatin (LIPITOR) 20 MG tablet TAKE 1 TABLET BY MOUTH AT  BEDTIME   Docusate Calcium (STOOL SOFTENER PO) Take 1 capsule by mouth in the morning and at bedtime.   metoprolol succinate (TOPROL-XL) 50 MG 24 hr tablet TAKE 1 TABLET BY MOUTH ONCE  DAILY TAKE WITH OR IMMEDIATELY  FOLLOWING A MEAL   Multiple Vitamins-Minerals (ONE-A-DAY MENS VITACRAVES PO) Take 1 tablet by mouth daily.    pantoprazole (PROTONIX) 40 MG tablet Take 1 tablet (40 mg total) by mouth 2 (two) times daily.   valsartan-hydrochlorothiazide (DIOVAN-HCT) 160-25 MG tablet TAKE 1 TABLET BY MOUTH DAILY   No facility-administered encounter medications on file as of 01/05/2023.    Diagnostic Tests Reviewed/Disposition: Reviewed at visit today.   Consults: General Surgery   Discharge Instructions: Reviewed during visit today.  Disease/illness Education: Provided during visit  Home Health/Community Services Discussions/Referrals: NA  Establishment or re-establishment of referral orders for community resources: NA  Discussion with other health care providers: Reviewed notes from hospitalization  Assessment and Support of treatment regimen adherence: Provided during visit  Appointments Coordinated with: Patient and his wife  Education for self-management, independent living, and ADLs: Discussed during visit  Relevant past medical, surgical, family and social history reviewed and updated as indicated. Interim medical history since our last visit reviewed. Allergies and medications reviewed and updated.  Review of Systems  Gastrointestinal:  Negative for abdominal pain, nausea and vomiting.    Per HPI unless specifically indicated  above     Objective:    BP 127/63 (BP Location: Left Arm, Cuff Size: Normal)   Pulse (!) 46   Temp 97.9 F (36.6 C) (Oral)   Wt 209 lb 4.8 oz (94.9 kg)   SpO2 98%   BMI 28.39 kg/m   Wt Readings from Last 3 Encounters:  01/05/23 209 lb 4.8 oz (94.9 kg)  12/26/22 218 lb 7.6 oz (99.1 kg)  12/15/22 212 lb 12.8 oz (96.5 kg)    Physical Exam Vitals and nursing note reviewed.  Constitutional:      General: He is not in acute distress.    Appearance: Normal appearance. He is not ill-appearing, toxic-appearing or diaphoretic.  HENT:     Head: Normocephalic.     Right Ear: External ear normal.     Left Ear: External ear normal.     Nose: Nose normal. No congestion or rhinorrhea.     Mouth/Throat:     Mouth: Mucous membranes are moist.  Eyes:     General:        Right eye: No discharge.        Left eye: No discharge.     Extraocular Movements: Extraocular movements intact.     Conjunctiva/sclera: Conjunctivae normal.     Pupils: Pupils are equal, round, and reactive to light.  Cardiovascular:     Rate and Rhythm: Normal rate and regular rhythm.     Heart sounds: No murmur heard. Pulmonary:     Effort: Pulmonary effort is normal. No respiratory distress.     Breath sounds: Normal breath sounds. No wheezing, rhonchi or rales.  Abdominal:     General: Abdomen is flat. Bowel sounds are normal.  Musculoskeletal:     Cervical back: Normal range of motion and neck supple.  Skin:    General: Skin is warm and dry.     Capillary Refill: Capillary refill takes less than 2 seconds.  Neurological:     General: No focal deficit present.     Mental Status: He is alert and oriented to person, place, and time.  Psychiatric:        Mood and Affect: Mood normal.        Behavior: Behavior normal.        Thought Content: Thought content normal.        Judgment: Judgment normal.     Results for orders placed or performed during the hospital encounter of 12/20/22  Lipase, blood  Result  Value Ref Range   Lipase 27 11 - 51 U/L  Comprehensive metabolic panel  Result Value Ref Range   Sodium 138 135 - 145 mmol/L   Potassium 3.4 (L) 3.5 - 5.1 mmol/L   Chloride 99 98 - 111 mmol/L   CO2 25 22 - 32 mmol/L   Glucose, Bld 172 (H) 70 - 99 mg/dL   BUN 18 8 - 23 mg/dL   Creatinine, Ser 0.91 0.61 - 1.24 mg/dL   Calcium 9.2 8.9 - 10.3 mg/dL   Total Protein 7.4 6.5 - 8.1 g/dL   Albumin 3.4 (L) 3.5 - 5.0 g/dL   AST 28 15 - 41 U/L   ALT 24 0 - 44 U/L   Alkaline  Phosphatase 73 38 - 126 U/L   Total Bilirubin 1.4 (H) 0.3 - 1.2 mg/dL   GFR, Estimated >60 >60 mL/min   Anion gap 14 5 - 15  CBC  Result Value Ref Range   WBC 10.1 4.0 - 10.5 K/uL   RBC 4.61 4.22 - 5.81 MIL/uL   Hemoglobin 14.1 13.0 - 17.0 g/dL   HCT 41.0 39.0 - 52.0 %   MCV 88.9 80.0 - 100.0 fL   MCH 30.6 26.0 - 34.0 pg   MCHC 34.4 30.0 - 36.0 g/dL   RDW 12.5 11.5 - 15.5 %   Platelets 269 150 - 400 K/uL   nRBC 0.0 0.0 - 0.2 %  Urinalysis, Routine w reflex microscopic -Urine, Clean Catch  Result Value Ref Range   Color, Urine AMBER (A) YELLOW   APPearance CLEAR (A) CLEAR   Specific Gravity, Urine 1.024 1.005 - 1.030   pH 6.0 5.0 - 8.0   Glucose, UA NEGATIVE NEGATIVE mg/dL   Hgb urine dipstick NEGATIVE NEGATIVE   Bilirubin Urine NEGATIVE NEGATIVE   Ketones, ur 20 (A) NEGATIVE mg/dL   Protein, ur 30 (A) NEGATIVE mg/dL   Nitrite NEGATIVE NEGATIVE   Leukocytes,Ua NEGATIVE NEGATIVE   RBC / HPF 0-5 0 - 5 RBC/hpf   WBC, UA 0-5 0 - 5 WBC/hpf   Bacteria, UA RARE (A) NONE SEEN   Squamous Epithelial / HPF 0-5 0 - 5 /HPF   Mucus PRESENT    Hyaline Casts, UA PRESENT   Magnesium  Result Value Ref Range   Magnesium 2.0 1.7 - 2.4 mg/dL  Protime-INR  Result Value Ref Range   Prothrombin Time 15.1 11.4 - 15.2 seconds   INR 1.2 0.8 - 1.2  Basic metabolic panel  Result Value Ref Range   Sodium 138 135 - 145 mmol/L   Potassium 3.7 3.5 - 5.1 mmol/L   Chloride 108 98 - 111 mmol/L   CO2 24 22 - 32 mmol/L   Glucose, Bld  102 (H) 70 - 99 mg/dL   BUN 19 8 - 23 mg/dL   Creatinine, Ser 0.80 0.61 - 1.24 mg/dL   Calcium 8.2 (L) 8.9 - 10.3 mg/dL   GFR, Estimated >60 >60 mL/min   Anion gap 6 5 - 15  CBC  Result Value Ref Range   WBC 8.1 4.0 - 10.5 K/uL   RBC 3.71 (L) 4.22 - 5.81 MIL/uL   Hemoglobin 11.2 (L) 13.0 - 17.0 g/dL   HCT 33.6 (L) 39.0 - 52.0 %   MCV 90.6 80.0 - 100.0 fL   MCH 30.2 26.0 - 34.0 pg   MCHC 33.3 30.0 - 36.0 g/dL   RDW 12.7 11.5 - 15.5 %   Platelets 185 150 - 400 K/uL   nRBC 0.0 0.0 - 0.2 %  Glucose, capillary  Result Value Ref Range   Glucose-Capillary 102 (H) 70 - 99 mg/dL  Comprehensive metabolic panel  Result Value Ref Range   Sodium 138 135 - 145 mmol/L   Potassium 3.4 (L) 3.5 - 5.1 mmol/L   Chloride 107 98 - 111 mmol/L   CO2 25 22 - 32 mmol/L   Glucose, Bld 119 (H) 70 - 99 mg/dL   BUN 12 8 - 23 mg/dL   Creatinine, Ser 0.77 0.61 - 1.24 mg/dL   Calcium 8.5 (L) 8.9 - 10.3 mg/dL   Total Protein 5.5 (L) 6.5 - 8.1 g/dL   Albumin 2.5 (L) 3.5 - 5.0 g/dL   AST 18 15 - 41 U/L  ALT 19 0 - 44 U/L   Alkaline Phosphatase 61 38 - 126 U/L   Total Bilirubin 0.9 0.3 - 1.2 mg/dL   GFR, Estimated >60 >60 mL/min   Anion gap 6 5 - 15  Phosphorus  Result Value Ref Range   Phosphorus 2.9 2.5 - 4.6 mg/dL  Magnesium  Result Value Ref Range   Magnesium 1.9 1.7 - 2.4 mg/dL  CBC  Result Value Ref Range   WBC 8.0 4.0 - 10.5 K/uL   RBC 3.83 (L) 4.22 - 5.81 MIL/uL   Hemoglobin 11.4 (L) 13.0 - 17.0 g/dL   HCT 34.7 (L) 39.0 - 52.0 %   MCV 90.6 80.0 - 100.0 fL   MCH 29.8 26.0 - 34.0 pg   MCHC 32.9 30.0 - 36.0 g/dL   RDW 12.7 11.5 - 15.5 %   Platelets 179 150 - 400 K/uL   nRBC 0.0 0.0 - 0.2 %  Vitamin B12  Result Value Ref Range   Vitamin B-12 298 180 - 914 pg/mL  Folate  Result Value Ref Range   Folate 23.0 >5.9 ng/mL  Iron and TIBC  Result Value Ref Range   Iron 45 45 - 182 ug/dL   TIBC 175 (L) 250 - 450 ug/dL   Saturation Ratios 26 17.9 - 39.5 %   UIBC 130 ug/dL  Ferritin   Result Value Ref Range   Ferritin 220 24 - 336 ng/mL  Reticulocytes  Result Value Ref Range   Retic Ct Pct 1.9 0.4 - 3.1 %   RBC. 3.82 (L) 4.22 - 5.81 MIL/uL   Retic Count, Absolute 71.8 19.0 - 186.0 K/uL   Immature Retic Fract 19.6 (H) 2.3 - 15.9 %  Glucose, capillary  Result Value Ref Range   Glucose-Capillary 107 (H) 70 - 99 mg/dL  Comprehensive metabolic panel  Result Value Ref Range   Sodium 138 135 - 145 mmol/L   Potassium 4.0 3.5 - 5.1 mmol/L   Chloride 108 98 - 111 mmol/L   CO2 24 22 - 32 mmol/L   Glucose, Bld 92 70 - 99 mg/dL   BUN 12 8 - 23 mg/dL   Creatinine, Ser 1.00 0.61 - 1.24 mg/dL   Calcium 8.0 (L) 8.9 - 10.3 mg/dL   Total Protein 5.3 (L) 6.5 - 8.1 g/dL   Albumin 2.5 (L) 3.5 - 5.0 g/dL   AST 16 15 - 41 U/L   ALT 17 0 - 44 U/L   Alkaline Phosphatase 62 38 - 126 U/L   Total Bilirubin 1.3 (H) 0.3 - 1.2 mg/dL   GFR, Estimated >60 >60 mL/min   Anion gap 6 5 - 15  Phosphorus  Result Value Ref Range   Phosphorus 3.4 2.5 - 4.6 mg/dL  Magnesium  Result Value Ref Range   Magnesium 1.9 1.7 - 2.4 mg/dL  Lipase, blood  Result Value Ref Range   Lipase 33 11 - 51 U/L  CBC  Result Value Ref Range   WBC 11.7 (H) 4.0 - 10.5 K/uL   RBC 3.65 (L) 4.22 - 5.81 MIL/uL   Hemoglobin 11.3 (L) 13.0 - 17.0 g/dL   HCT 33.6 (L) 39.0 - 52.0 %   MCV 92.1 80.0 - 100.0 fL   MCH 31.0 26.0 - 34.0 pg   MCHC 33.6 30.0 - 36.0 g/dL   RDW 12.3 11.5 - 15.5 %   Platelets 195 150 - 400 K/uL   nRBC 0.0 0.0 - 0.2 %  Glucose, capillary  Result Value Ref Range  Glucose-Capillary 92 70 - 99 mg/dL   Comment 1 Notify RN    Comment 2 Document in Chart   CBC  Result Value Ref Range   WBC 12.7 (H) 4.0 - 10.5 K/uL   RBC 3.54 (L) 4.22 - 5.81 MIL/uL   Hemoglobin 10.9 (L) 13.0 - 17.0 g/dL   HCT 32.5 (L) 39.0 - 52.0 %   MCV 91.8 80.0 - 100.0 fL   MCH 30.8 26.0 - 34.0 pg   MCHC 33.5 30.0 - 36.0 g/dL   RDW 12.6 11.5 - 15.5 %   Platelets 173 150 - 400 K/uL   nRBC 0.0 0.0 - 0.2 %  Comprehensive  metabolic panel  Result Value Ref Range   Sodium 138 135 - 145 mmol/L   Potassium 4.1 3.5 - 5.1 mmol/L   Chloride 109 98 - 111 mmol/L   CO2 22 22 - 32 mmol/L   Glucose, Bld 73 70 - 99 mg/dL   BUN 11 8 - 23 mg/dL   Creatinine, Ser 1.00 0.61 - 1.24 mg/dL   Calcium 8.1 (L) 8.9 - 10.3 mg/dL   Total Protein 5.4 (L) 6.5 - 8.1 g/dL   Albumin 2.5 (L) 3.5 - 5.0 g/dL   AST 14 (L) 15 - 41 U/L   ALT 15 0 - 44 U/L   Alkaline Phosphatase 64 38 - 126 U/L   Total Bilirubin 1.5 (H) 0.3 - 1.2 mg/dL   GFR, Estimated >60 >60 mL/min   Anion gap 7 5 - 15  Magnesium  Result Value Ref Range   Magnesium 1.9 1.7 - 2.4 mg/dL  Glucose, capillary  Result Value Ref Range   Glucose-Capillary 76 70 - 99 mg/dL   Comment 1 Notify RN    Comment 2 Document in Chart   CBC  Result Value Ref Range   WBC 10.8 (H) 4.0 - 10.5 K/uL   RBC 3.29 (L) 4.22 - 5.81 MIL/uL   Hemoglobin 10.2 (L) 13.0 - 17.0 g/dL   HCT 30.2 (L) 39.0 - 52.0 %   MCV 91.8 80.0 - 100.0 fL   MCH 31.0 26.0 - 34.0 pg   MCHC 33.8 30.0 - 36.0 g/dL   RDW 12.6 11.5 - 15.5 %   Platelets 193 150 - 400 K/uL   nRBC 0.0 0.0 - 0.2 %  Glucose, capillary  Result Value Ref Range   Glucose-Capillary 69 (L) 70 - 99 mg/dL  Glucose, capillary  Result Value Ref Range   Glucose-Capillary 181 (H) 70 - 99 mg/dL  Glucose, capillary  Result Value Ref Range   Glucose-Capillary 91 70 - 99 mg/dL  Hepatic function panel  Result Value Ref Range   Total Protein 5.3 (L) 6.5 - 8.1 g/dL   Albumin 2.3 (L) 3.5 - 5.0 g/dL   AST 14 (L) 15 - 41 U/L   ALT 13 0 - 44 U/L   Alkaline Phosphatase 55 38 - 126 U/L   Total Bilirubin 0.7 0.3 - 1.2 mg/dL   Bilirubin, Direct 0.2 0.0 - 0.2 mg/dL   Indirect Bilirubin 0.5 0.3 - 0.9 mg/dL  Renal function panel  Result Value Ref Range   Sodium 138 135 - 145 mmol/L   Potassium 3.7 3.5 - 5.1 mmol/L   Chloride 108 98 - 111 mmol/L   CO2 23 22 - 32 mmol/L   Glucose, Bld 161 (H) 70 - 99 mg/dL   BUN 7 (L) 8 - 23 mg/dL   Creatinine, Ser  0.87 0.61 - 1.24 mg/dL   Calcium  8.0 (L) 8.9 - 10.3 mg/dL   Phosphorus 1.9 (L) 2.5 - 4.6 mg/dL   Albumin 2.3 (L) 3.5 - 5.0 g/dL   GFR, Estimated >60 >60 mL/min   Anion gap 7 5 - 15  Magnesium  Result Value Ref Range   Magnesium 2.0 1.7 - 2.4 mg/dL  CBC  Result Value Ref Range   WBC 8.9 4.0 - 10.5 K/uL   RBC 3.36 (L) 4.22 - 5.81 MIL/uL   Hemoglobin 10.3 (L) 13.0 - 17.0 g/dL   HCT 30.6 (L) 39.0 - 52.0 %   MCV 91.1 80.0 - 100.0 fL   MCH 30.7 26.0 - 34.0 pg   MCHC 33.7 30.0 - 36.0 g/dL   RDW 12.7 11.5 - 15.5 %   Platelets 186 150 - 400 K/uL   nRBC 0.0 0.0 - 0.2 %      Assessment & Plan:   Problem List Items Addressed This Visit       Digestive   Choledocholithiasis    Doing well since surgery.  Has been on liquid diet.  Will follow up with surgery this week.        Duodenal ulcer    Continue with Protonix.  Avoid NSAIDS.  CMP/CBC ordered at visit today.       Duodenal perforation (HCC)    Continue with Protonix.  Avoid NSAIDS.  CMP/CBC ordered at visit today.       Other Visit Diagnoses     Hospital discharge follow-up    -  Primary   Reviewed notes, labs and imaging.  CMP/CBC checked at visit today.  Has appt with General Surgery on Thursday.   Relevant Orders   Comp Met (CMET)   CBC w/Diff        Follow up plan: Return in about 3 months (around 04/07/2023) for Physical and Fasting labs.

## 2023-01-05 NOTE — Assessment & Plan Note (Signed)
Doing well since surgery.  Has been on liquid diet.  Will follow up with surgery this week.

## 2023-01-06 LAB — CBC WITH DIFFERENTIAL/PLATELET
Basophils Absolute: 0.1 10*3/uL (ref 0.0–0.2)
Basos: 1 %
EOS (ABSOLUTE): 0.4 10*3/uL (ref 0.0–0.4)
Eos: 5 %
Hematocrit: 38.3 % (ref 37.5–51.0)
Hemoglobin: 13 g/dL (ref 13.0–17.7)
Immature Grans (Abs): 0.1 10*3/uL (ref 0.0–0.1)
Immature Granulocytes: 1 %
Lymphocytes Absolute: 2.9 10*3/uL (ref 0.7–3.1)
Lymphs: 35 %
MCH: 30.4 pg (ref 26.6–33.0)
MCHC: 33.9 g/dL (ref 31.5–35.7)
MCV: 90 fL (ref 79–97)
Monocytes Absolute: 0.8 10*3/uL (ref 0.1–0.9)
Monocytes: 9 %
Neutrophils Absolute: 4 10*3/uL (ref 1.4–7.0)
Neutrophils: 49 %
Platelets: 277 10*3/uL (ref 150–450)
RBC: 4.28 x10E6/uL (ref 4.14–5.80)
RDW: 12.5 % (ref 11.6–15.4)
WBC: 8.1 10*3/uL (ref 3.4–10.8)

## 2023-01-06 LAB — COMPREHENSIVE METABOLIC PANEL
ALT: 20 IU/L (ref 0–44)
AST: 24 IU/L (ref 0–40)
Albumin/Globulin Ratio: 1.3 (ref 1.2–2.2)
Albumin: 3.7 g/dL — ABNORMAL LOW (ref 3.8–4.8)
Alkaline Phosphatase: 112 IU/L (ref 44–121)
BUN/Creatinine Ratio: 19 (ref 10–24)
BUN: 17 mg/dL (ref 8–27)
Bilirubin Total: 0.3 mg/dL (ref 0.0–1.2)
CO2: 27 mmol/L (ref 20–29)
Calcium: 9.5 mg/dL (ref 8.6–10.2)
Chloride: 100 mmol/L (ref 96–106)
Creatinine, Ser: 0.88 mg/dL (ref 0.76–1.27)
Globulin, Total: 2.9 g/dL (ref 1.5–4.5)
Glucose: 102 mg/dL — ABNORMAL HIGH (ref 70–99)
Potassium: 4.2 mmol/L (ref 3.5–5.2)
Sodium: 139 mmol/L (ref 134–144)
Total Protein: 6.6 g/dL (ref 6.0–8.5)
eGFR: 89 mL/min/{1.73_m2} (ref >59)

## 2023-01-06 NOTE — Progress Notes (Signed)
Hi Drew Mcmahon. It was nice to see you yesterday.  Your lab work looks good.  Your potassium has improved back to normal range.  Your Albumin (a protein in your body) is also improving which is great.  Your anemia has returned to normal. No concerns at this time. Continue with your current medication regimen.  Follow up as discussed.  Please let me know if you have any questions.

## 2023-01-08 ENCOUNTER — Ambulatory Visit (INDEPENDENT_AMBULATORY_CARE_PROVIDER_SITE_OTHER): Payer: Medicare Other | Admitting: Surgery

## 2023-01-08 ENCOUNTER — Other Ambulatory Visit: Payer: Self-pay

## 2023-01-08 ENCOUNTER — Encounter: Payer: Self-pay | Admitting: Surgery

## 2023-01-08 VITALS — BP 154/75 | HR 80 | Temp 97.9°F | Ht 72.0 in | Wt 207.0 lb

## 2023-01-08 DIAGNOSIS — K8010 Calculus of gallbladder with chronic cholecystitis without obstruction: Secondary | ICD-10-CM

## 2023-01-08 DIAGNOSIS — Z9049 Acquired absence of other specified parts of digestive tract: Secondary | ICD-10-CM | POA: Insufficient documentation

## 2023-01-08 DIAGNOSIS — Z09 Encounter for follow-up examination after completed treatment for conditions other than malignant neoplasm: Secondary | ICD-10-CM

## 2023-01-08 NOTE — Patient Instructions (Signed)

## 2023-01-08 NOTE — Progress Notes (Signed)
[facility] POST-OP OFFICE VISIT  01/08/2023  HPI: Drew Mcmahon is a 77 y.o. male 19 days s/p robotic cholecystectomy.  Postoperative course was complicated by a probable asymptomatic retained common duct stone noted on MRCP well evaluating for pain related to esophagitis/duodenal ulcer/diverticulum.  Attempted ERCP raise concerns about perforation, due to challenges of adjacent inflammatory changes.  The region was clipped and the patient was observed without consequence or sequelae. He now presents today for follow-up.  Discharged on PPI, Protonix and Augmentin.  Was to advance his diet, after upper GI series had confirmed no leak.  However he has stayed on a full liquid diet since that time.  He was to have labs of CMP and CBC prior to follow-up visit.  On March 1 his CMP was negative: Total bilirubin, alkaline phosphatase, AST and ALT all within normal limits.  CBC on 301 and 311 also with normal white blood cell count.  Vital signs: BP (!) 154/75   Pulse 80   Temp 97.9 F (36.6 C) (Oral)   Ht 6' (1.829 m)   Wt 207 lb (93.9 kg)   SpO2 98%   BMI 28.07 kg/m    Physical Exam: Constitutional: Appears well, no complaints of fever, abdominal pain, reflux, nausea or vomiting. Abdomen: Soft and nontender.  There is a thickened scar in the left lower quadrant incision, that was extraction site and this is all consistent with fascial closure and adjacent inflamed subcutaneous adipose.  No evidence of hernia. Skin: Incisions are clean dry and intact.  Assessment/Plan: This is a 77 y.o. male 19 days s/p robotic cholecystectomy.  Patient Active Problem List   Diagnosis Date Noted   Protein-calorie malnutrition, severe 12/26/2022   Normocytic anemia 12/25/2022   Duodenal ulcer 12/23/2022   Reflux esophagitis 12/23/2022   Duodenal perforation ([place]) 12/23/2022   Choledocholithiasis 12/22/2022   Duodenitis 12/20/2022   Hypokalemia 12/20/2022   CCC (chronic calculous  cholecystitis) 12/11/2022   Abdominal pain 12/09/2022   Prediabetes 12/02/2018   Osteopenia 07/19/2018   Obesity (BMI 30.0-34.9) 05/31/2018   Diverticulosis of colon 05/31/2018   Aortic calcification ([place]) 05/31/2018   Status post repair of ventral hernia 05/27/2018   Spinal stenosis at L4-L5 level 12/01/2016   BPH (benign prostatic hyperplasia) 08/27/2015   Peripheral neuropathy 08/27/2015   Hyperlipidemia    Hypertension     -We discussed the potential GI follow-up with upper endoscopy likely full 2 months after his operation.  Will be glad to see him again should any needs arise.  He should complete his 8-week course of PPI.  He may begin to advance his diet from full liquids on.   Ronny Bacon M.D., FACS 01/08/2023, 1:40 PM

## 2023-01-14 ENCOUNTER — Encounter: Payer: Medicare Other | Admitting: Nurse Practitioner

## 2023-01-22 DIAGNOSIS — L298 Other pruritus: Secondary | ICD-10-CM | POA: Diagnosis not present

## 2023-01-22 DIAGNOSIS — L853 Xerosis cutis: Secondary | ICD-10-CM | POA: Diagnosis not present

## 2023-01-22 DIAGNOSIS — L821 Other seborrheic keratosis: Secondary | ICD-10-CM | POA: Diagnosis not present

## 2023-01-22 DIAGNOSIS — Z872 Personal history of diseases of the skin and subcutaneous tissue: Secondary | ICD-10-CM | POA: Diagnosis not present

## 2023-01-22 DIAGNOSIS — L578 Other skin changes due to chronic exposure to nonionizing radiation: Secondary | ICD-10-CM | POA: Diagnosis not present

## 2023-01-22 DIAGNOSIS — L570 Actinic keratosis: Secondary | ICD-10-CM | POA: Diagnosis not present

## 2023-04-07 ENCOUNTER — Encounter: Payer: Medicare Other | Admitting: Nurse Practitioner

## 2023-04-27 ENCOUNTER — Telehealth: Payer: Self-pay | Admitting: Nurse Practitioner

## 2023-04-27 NOTE — Telephone Encounter (Signed)
Copied from CRM [id number]. Topic: Medicare AWV >> Apr 27, 2023 10:43 AM Payton Doughty wrote: Reason for CRM: LM 04/27/2023 to schedule AWV   Verlee Rossetti; Care Guide Ambulatory Clinical Support [place] [facility] Direct Dial: [phone]

## 2023-06-21 ENCOUNTER — Other Ambulatory Visit: Payer: Self-pay | Admitting: Nurse Practitioner

## 2023-06-21 DIAGNOSIS — E785 Hyperlipidemia, unspecified: Secondary | ICD-10-CM

## 2023-06-21 DIAGNOSIS — I10 Essential (primary) hypertension: Secondary | ICD-10-CM

## 2023-06-23 NOTE — Telephone Encounter (Signed)
Requested Prescriptions  Pending Prescriptions Disp Refills   valsartan-hydrochlorothiazide (DIOVAN-HCT) 160-25 MG tablet [Pharmacy Med Name: Valsartan-hydroCHLOROthiazide 160-25 MG Oral Tablet] 100 tablet 0    Sig: TAKE 1 TABLET BY MOUTH DAILY     Cardiovascular: ARB + Diuretic Combos Failed - 06/21/2023  4:36 AM      Failed - Last BP in normal range    BP Readings from Last 1 Encounters:  01/08/23 (!) 154/75         Passed - K in normal range and within 180 days    Potassium  Date Value Ref Range Status  01/05/2023 4.2 3.5 - 5.2 mmol/L Final         Passed - Na in normal range and within 180 days    Sodium  Date Value Ref Range Status  01/05/2023 139 134 - 144 mmol/L Final         Passed - Cr in normal range and within 180 days    Creat  Date Value Ref Range Status  07/29/2019 1.03 0.70 - 1.18 mg/dL Final    Comment:    For patients >37 years of age, the reference limit for Creatinine is approximately 13% higher for people identified as African-American. .    Creatinine, Ser  Date Value Ref Range Status  01/05/2023 0.88 0.76 - 1.27 mg/dL Final         Passed - eGFR is 10 or above and within 180 days    GFR, Est African American  Date Value Ref Range Status  07/29/2019 84 > OR = 60 mL/min/1.71m2 Final   GFR calc Af Amer  Date Value Ref Range Status  09/25/2020 98 >59 mL/min/1.73 Final    Comment:    **In accordance with recommendations from the NKF-ASN Task force,**   [facility] is in the process of updating its eGFR calculation to the   2021 CKD-EPI creatinine equation that estimates kidney function   without a race variable.    GFR, Est Non African American  Date Value Ref Range Status  07/29/2019 72 > OR = 60 mL/min/1.6m2 Final   GFR, Estimated  Date Value Ref Range Status  12/26/2022 >60 >60 mL/min Final    Comment:    (NOTE) Calculated using the CKD-EPI Creatinine Equation (2021)    eGFR  Date Value Ref Range Status  01/05/2023 89 >59  mL/min/1.73 Final         Passed - Patient is not pregnant      Passed - Valid encounter within last 6 months    Recent Outpatient Visits           5 months ago Hospital discharge follow-up   [place] [facility] Larae Grooms, NP   6 months ago Abdominal pain, unspecified abdominal location   [facility] Larae Grooms, NP   8 months ago COVID-19   [facility] Larae Grooms, NP   9 months ago Right foot pain   [place] [facility] Larae Grooms, NP   11 months ago Primary hypertension   [place] [facility] Larae Grooms, NP               atorvastatin (LIPITOR) 20 MG tablet [Pharmacy Med Name: Atorvastatin Calcium 20 MG Oral Tablet] 100 tablet 0    Sig: TAKE 1 TABLET BY MOUTH AT  BEDTIME     Cardiovascular:  Antilipid - Statins Failed - 06/21/2023  4:36 AM      Failed -  Lipid Panel in normal range within the last 12 months    Cholesterol, Total  Date Value Ref Range Status  07/16/2022 147 100 - 199 mg/dL Final   Cholesterol [place]  Date Value Ref Range Status  03/04/2016 205 (H) <200 mg/dL Final    Comment:                            Desirable                <200                         Borderline High      200- 239                         High                     >239    LDL Cholesterol (Calc)  Date Value Ref Range Status  07/29/2019 72 mg/dL (calc) Final    Comment:    Reference range: <100 . Desirable range <100 mg/dL for primary prevention;   <70 mg/dL for patients with CHD or diabetic patients  with > or = 2 CHD risk factors. Marland Kitchen LDL-C is now calculated using the Martin-Hopkins  calculation, which is a validated novel method providing  better accuracy than the Friedewald equation in the  estimation of LDL-C.  Horald Pollen et al. Lenox Ahr. 2130;865(78): 2061-2068  (http://education.[employer].com/faq/FAQ164)    LDL Chol Calc  (NIH)  Date Value Ref Range Status  07/16/2022 73 0 - 99 mg/dL Final   HDL  Date Value Ref Range Status  07/16/2022 58 >39 mg/dL Final   Triglycerides  Date Value Ref Range Status  07/16/2022 82 0 - 149 mg/dL Final   Triglycerides [place],Waived  Date Value Ref Range Status  03/04/2016 89 <150 mg/dL Final    Comment:                            Normal                   <150                         Borderline High     150 - 199                         High                200 - 499                         Very High                >499          Passed - Patient is not pregnant      Passed - Valid encounter within last 12 months    Recent Outpatient Visits           5 months ago Hospital discharge follow-up   [place] [facility] Larae Grooms, NP   6 months ago Abdominal pain, unspecified abdominal location   [facility] Larae Grooms, NP   8 months ago COVID-19   [facility] Larae Grooms, NP   9 months ago Right foot pain   [place] [facility], Clydie Braun, NP   11 months ago Primary hypertension   [place] [facility] Larae Grooms, NP               metoprolol succinate (TOPROL-XL) 50 MG 24 hr tablet [Pharmacy Med Name: Metoprolol Succinate ER 50 MG Oral Tablet Extended Release 24 Hour] 100 tablet 0    Sig: TAKE 1 TABLET BY MOUTH ONCE  DAILY TAKE WITH OR IMMEDIATELY  FOLLOWING A MEAL     Cardiovascular:  Beta Blockers Failed - 06/21/2023  4:36 AM      Failed - Last BP in normal range    BP Readings from Last 1 Encounters:  01/08/23 (!) 154/75         Passed - Last Heart Rate in normal range    Pulse Readings from Last 1 Encounters:  01/08/23 80         Passed - Valid encounter within last 6 months    Recent Outpatient Visits           5 months ago Hospital discharge follow-up   [place] [facility] Larae Grooms,  NP   6 months ago Abdominal pain, unspecified abdominal location   [place] Larae Grooms, NP   8 months ago COVID-19   [facility] Larae Grooms, NP   9 months ago Right foot pain   [place] [facility] Larae Grooms, NP   11 months ago Primary hypertension   [place] [facility] Larae Grooms, NP

## 2023-07-30 DIAGNOSIS — H18613 Keratoconus, stable, bilateral: Secondary | ICD-10-CM | POA: Diagnosis not present

## 2023-07-30 DIAGNOSIS — H35373 Puckering of macula, bilateral: Secondary | ICD-10-CM | POA: Diagnosis not present

## 2023-07-30 DIAGNOSIS — H2513 Age-related nuclear cataract, bilateral: Secondary | ICD-10-CM | POA: Diagnosis not present

## 2023-08-10 NOTE — Progress Notes (Unsigned)
There were no vitals taken for this visit.   Subjective:    Patient ID: RIOT SIPP, male    DOB: [birth year], 77 y.o.   MRN: [id number]  HPI: Drew Mcmahon is a 77 y.o. male presenting on 08/11/2023 for comprehensive medical examination. Current medical complaints include:{Blank single:19197::"none","***"}  He currently lives with: Interim Problems from his last visit: {Blank single:19197::"yes","no"}  HYPERTENSION / HYPERLIPIDEMIA Satisfied with current treatment? yes Duration of hypertension: years BP monitoring frequency: daily; multiple times a day BP range: 110-130/70 BP medication side effects: no Past BP meds: valsartan-HCTZ and Metoprolol Duration of hyperlipidemia: years Cholesterol medication side effects: no Cholesterol supplements: none Past cholesterol medications: atorvastain (lipitor)  Medication compliance: excellent compliance Aspirin: no Recent stressors: no Recurrent headaches: no  Visual changes: no Palpitations: no Dyspnea: no Chest pain: no Lower extremity edema: no Dizzy/lightheaded: no   Functional Status Survey:    FALL RISK:    01/08/2023    1:16 PM 01/05/2023    3:38 PM 12/09/2022    8:39 AM 09/11/2022    9:01 AM 07/16/2022   10:26 AM  Fall Risk   Falls in the past year? 0 0 0 0 0  Number falls in past yr:  0 0 0 0  Injury with Fall?  0 0 0 0  Risk for fall due to :  No Fall Risks No Fall Risks No Fall Risks No Fall Risks  Follow up  Falls evaluation completed Falls evaluation completed Falls evaluation completed Falls evaluation completed    Depression Screen    01/05/2023    3:38 PM 12/09/2022    8:40 AM 09/11/2022    9:01 AM 07/16/2022   10:26 AM 06/16/2022    9:49 AM  Depression screen PHQ 2/9  Decreased Interest 0 0 0 0 0  Down, Depressed, Hopeless 0 0 0 0 0  PHQ - 2 Score 0 0 0 0 0  Altered sleeping 0 0 1 0   Tired, decreased energy 0 0 0 0   Change in appetite 0 0 0 0   Feeling bad or failure about yourself  0 0 0 0    Trouble concentrating 0 0 0 0   Moving slowly or fidgety/restless 0 0 0 0   Suicidal thoughts 0 0 0 0   PHQ-9 Score 0 0 1 0   Difficult doing work/chores Not difficult at all Not difficult at all Not difficult at all Not difficult at all     Advanced Directives Does patient have a HCPOA?    {Blank single:19197::"yes","no"} If yes, name and contact information:  Does patient have a living will or MOST form?  {Blank single:19197::"yes","no"}  Past Medical History:  Past Medical History:  Diagnosis Date   Aortic calcification (HCC)    Arthritis    Elevated lipids    Hip fracture (HCC) 2007   History of hip fracture 07/04/2018   Hyperlipidemia    Hypertension    Osteopenia 07/19/2018   Sept 2019; next scan Sept 2021   Postoperative nausea and vomiting 12/24/2018   Pre-diabetes     Surgical History:  Past Surgical History:  Procedure Laterality Date   APPENDECTOMY  04/03/2017   Incidental APPENDECTOMY;  Surgeon: Earline Mayotte, MD;  Location: [facility]S;  Service: General;;   BOWEL RESECTION  04/03/2017   Procedure: FOCAL SMALL BOWEL RESECTION;  Surgeon: Earline Mayotte, MD;  Location: [facility];  Service: General;;   CHOLECYSTECTOMY  12/15/22   COLON SURGERY  COLONOSCOPY WITH PROPOFOL N/A 06/12/2016   Procedure: COLONOSCOPY WITH PROPOFOL;  Surgeon: Scot Jun, MD;  Location: [facility] ENDOSCOPY;  Service: Endoscopy;  Laterality: N/A;   COLONOSCOPY WITH PROPOFOL N/A 05/07/2022   Procedure: COLONOSCOPY WITH PROPOFOL;  Surgeon: Wyline Mood, MD;  Location: [facility] ENDOSCOPY;  Service: Gastroenterology;  Laterality: N/A;   CYST EXCISION  1973   lower spine   ERCP N/A 12/22/2022   Procedure: ENDOSCOPIC RETROGRADE CHOLANGIOPANCREATOGRAPHY (ERCP);  Surgeon: Midge Minium, MD;  Location: [facility] END[facility]COPY;  Service: Endoscopy;  Laterality: N/A;   FRACTURE SURGERY Left 2007   hip   HOLEP-LASER ENUCLEATION OF THE PROSTATE WITH MORCELLATION N/A 06/14/2018   Procedure: HOLEP-LASER  ENUCLEATION OF THE PROSTATE WITH MORCELLATION;  Surgeon: Vanna Scotland, MD;  Location: [facility];  Service: Urology;  Laterality: N/A;   LAPAROSCOPY N/A 04/01/2017   Procedure: LAPAROSCOPY DIAGNOSTIC;  Surgeon: Earline Mayotte, MD;  Location: [facility];  Service: General;  Laterality: N/A;   LAPAROTOMY N/A 04/03/2017   Procedure: EXPLORATORY LAPAROTOMY;  Surgeon: Earline Mayotte, MD;  Location: [facility];  Service: General;  Laterality: N/A;   LYSIS OF ADHESION  04/03/2017   Procedure: LYSIS OF ADHESION;  Surgeon: Earline Mayotte, MD;  Location: [facility];  Service: General;;   LYSIS OF ADHESION  12/15/2022   Procedure: LYSIS OF ADHESION;  Surgeon: Campbell Lerner, MD;  Location: [facility];  Service: General;;   VENTRAL HERNIA REPAIR N/A 12/13/2018   Procedure: HERNIA REPAIR VENTRAL ADULT WITH COMPONENT SEPARATOR MESH;  Surgeon: Earline Mayotte, MD;  Location: [facility];  Service: General;  Laterality: N/A;    Medications:  Current Outpatient Medications on File Prior to Visit  Medication Sig   acetaminophen (TYLENOL) 500 MG tablet Take 1,000 mg by mouth every 6 (six) hours as needed.   atorvastatin (LIPITOR) 20 MG tablet TAKE 1 TABLET BY MOUTH AT  BEDTIME   Docusate Calcium (STOOL SOFTENER PO) Take 1 capsule by mouth in the morning and at bedtime.   metoprolol succinate (TOPROL-XL) 50 MG 24 hr tablet TAKE 1 TABLET BY MOUTH ONCE  DAILY TAKE WITH OR IMMEDIATELY  FOLLOWING A MEAL   Multiple Vitamins-Minerals (ONE-A-DAY MENS VITACRAVES PO) Take 1 tablet by mouth daily.    pantoprazole (PROTONIX) 40 MG tablet Take 1 tablet (40 mg total) by mouth 2 (two) times daily.   valsartan-hydrochlorothiazide (DIOVAN-HCT) 160-25 MG tablet TAKE 1 TABLET BY MOUTH DAILY   No current facility-administered medications on file prior to visit.    Allergies:  No Known Allergies  Social History:  Social History   Socioeconomic History   Marital status: Married    Spouse name: Inocencio Homes   Number of  children: 3   Years of education: Not on file   Highest education level: 12th grade  Occupational History   Occupation: Retired  Tobacco Use   Smoking status: Former    Current packs/day: 0.00    Average packs/day: 0.5 packs/day for 10.0 years (5.0 ttl pk-yrs)    Types: Cigarettes    Start date: 08/27/1975    Quit date: 08/26/1985    Years since quitting: 37.9   Smokeless tobacco: Never   Tobacco comments:    smoking cessation materials not required  Vaping Use   Vaping status: Never Used  Substance and Sexual Activity   Alcohol use: No   Drug use: No   Sexual activity: Not Currently  Other Topics Concern   Not on file  Social History Narrative   Not on file  Social Determinants of Health   Financial Resource Strain: Low Risk  (08/08/2023)   Overall Financial Resource Strain (CARDIA)    Difficulty of Paying Living Expenses: Not hard at all  Food Insecurity: No Food Insecurity (08/08/2023)   Hunger Vital Sign    Worried About Running Out of Food in the Last Year: Never true    Ran Out of Food in the Last Year: Never true  Transportation Needs: No Transportation Needs (08/08/2023)   PRAPARE - Administrator, Civil Service (Medical): No    Lack of Transportation (Non-Medical): No  Physical Activity: Insufficiently Active (08/08/2023)   Exercise Vital Sign    Days of Exercise per Week: 7 days    Minutes of Exercise per Session: 20 min  Stress: No Stress Concern Present (08/08/2023)   [employer] of Occupational Health - Occupational Stress Questionnaire    Feeling of Stress : Not at all  Social Connections: Socially Integrated (08/08/2023)   Social Connection and Isolation Panel [NHANES]    Frequency of Communication with Friends and Family: More than three times a week    Frequency of Social Gatherings with Friends and Family: More than three times a week    Attends Religious Services: More than 4 times per year    Active Member of [employer] or  Organizations: Yes    Attends Engineer, structural: More than 4 times per year    Marital Status: Married  Catering manager Violence: Not At Risk (12/20/2022)   Humiliation, Afraid, Rape, and Kick questionnaire    Fear of Current or Ex-Partner: No    Emotionally Abused: No    Physically Abused: No    Sexually Abused: No   Social History   Tobacco Use  Smoking Status Former   Current packs/day: 0.00   Average packs/day: 0.5 packs/day for 10.0 years (5.0 ttl pk-yrs)   Types: Cigarettes   Start date: 08/27/1975   Quit date: 08/26/1985   Years since quitting: 37.9  Smokeless Tobacco Never  Tobacco Comments   smoking cessation materials not required   Social History   Substance and Sexual Activity  Alcohol Use No    Family History:  Family History  Problem Relation Age of Onset   Hypertension Mother    Alzheimer's disease Mother    Hypertension Father    Cancer Father        lung   Hypertension Sister    Hypertension Brother    Hypertension Sister    Hypertension Sister    Hypertension Brother    Prostate cancer Neg Hx    Kidney cancer Neg Hx     Past medical history, surgical history, medications, allergies, family history and social history reviewed with patient today and changes made to appropriate areas of the chart.   ROS All other ROS negative except what is listed above and in the HPI.      Objective:    There were no vitals taken for this visit.  Wt Readings from Last 3 Encounters:  01/08/23 207 lb (93.9 kg)  01/05/23 209 lb 4.8 oz (94.9 kg)  12/26/22 218 lb 7.6 oz (99.1 kg)    No results found.  Physical Exam     04/17/2022    8:34 AM 08/31/2020    9:24 AM 06/29/2018    8:58 AM  6CIT Screen  What Year? 0 points 0 points 0 points  What month? 0 points 0 points 0 points  What time? 0 points 0 points 0  points  Count back from 20 0 points 0 points 0 points  Months in reverse 0 points 0 points 0 points  Repeat phrase 0 points 0 points 0  points  Total Score 0 points 0 points 0 points    Cognitive Testing - 6-CIT  Correct? Score   What year is it? {YES NO:22349} {Numbers; 0-4:31231} Yes = 0    No = 4  What month is it? {YES NO:22349} {Numbers; 0-4:31231} Yes = 0    No = 3  Remember:     Floyde Parkins, [address] [place], [place]     What time is it? {YES NO:22349} {Numbers; 0-4:31231} Yes = 0    No = 3  Count backwards from 20 to 1 {YES NO:22349} {Numbers; 0-4:31231} Correct = 0    1 error = 2   More than 1 error = 4  Say the months of the year in reverse. {YES NO:22349} {Numbers; 0-4:31231} Correct = 0    1 error = 2   More than 1 error = 4  What address did I ask you to remember? {YES NO:22349} {NUMBERS; 0-10:5044} Correct = 0  1 error = 2    2 error = 4    3 error = 6    4 error = 8    All wrong = 10       TOTAL SCORE  {Numbers; 1-02:72536}/64   Interpretation:  {Desc; normal/abnormal:11317::"Normal"}  Normal (0-7) Abnormal (8-28)    Results for orders placed or performed in visit on 01/05/23  Comp Met (CMET)  Result Value Ref Range   Glucose 102 (H) 70 - 99 mg/dL   BUN 17 8 - 27 mg/dL   Creatinine, Ser 4.03 0.76 - 1.27 mg/dL   eGFR 89 >47 QQ/VZD/6.38   BUN/Creatinine Ratio 19 10 - 24   Sodium 139 134 - 144 mmol/L   Potassium 4.2 3.5 - 5.2 mmol/L   Chloride 100 96 - 106 mmol/L   CO2 27 20 - 29 mmol/L   Calcium 9.5 8.6 - 10.2 mg/dL   Total Protein 6.6 6.0 - 8.5 g/dL   Albumin 3.7 (L) 3.8 - 4.8 g/dL   Globulin, Total 2.9 1.5 - 4.5 g/dL   Albumin/Globulin Ratio 1.3 1.2 - 2.2   Bilirubin Total 0.3 0.0 - 1.2 mg/dL   Alkaline Phosphatase 112 44 - 121 IU/L   AST 24 0 - 40 IU/L   ALT 20 0 - 44 IU/L  CBC w/Diff  Result Value Ref Range   WBC 8.1 3.4 - 10.8 x10E3/uL   RBC 4.28 4.14 - 5.80 x10E6/uL   Hemoglobin 13.0 13.0 - 17.7 g/dL   Hematocrit 75.6 43.3 - 51.0 %   MCV 90 79 - 97 fL   MCH 30.4 26.6 - 33.0 pg   MCHC 33.9 31.5 - 35.7 g/dL   RDW 29.5 18.8 - 41.6 %   Platelets 277 150 - 450 x10E3/uL   Neutrophils 49  Not Estab. %   Lymphs 35 Not Estab. %   Monocytes 9 Not Estab. %   Eos 5 Not Estab. %   Basos 1 Not Estab. %   Neutrophils Absolute 4.0 1.4 - 7.0 x10E3/uL   Lymphocytes Absolute 2.9 0.7 - 3.1 x10E3/uL   Monocytes Absolute 0.8 0.1 - 0.9 x10E3/uL   EOS (ABSOLUTE) 0.4 0.0 - 0.4 x10E3/uL   Basophils Absolute 0.1 0.0 - 0.2 x10E3/uL   Immature Granulocytes 1 Not Estab. %   Immature Grans (Abs) 0.1 0.0 -  0.1 x10E3/uL      Assessment & Plan:   Problem List Items Addressed This Visit   None    Preventative Services:  Health Risk Assessment and Personalized Prevention Plan: Bone Mass Measurements: CVD Screening:  Colon Cancer Screening:  Depression Screening:  Diabetes Screening:  Glaucoma Screening:  Hepatitis B vaccine: Hepatitis C screening:  HIV Screening: Flu Vaccine: Lung cancer Screening: Obesity Screening:  Pneumonia Vaccines (2): STI Screening: PSA screening:  Discussed aspirin prophylaxis for myocardial infarction prevention and decision was {Blank single:19197::"it was not indicated","made to continue ASA","made to start ASA","made to stop ASA","that we recommended ASA, and patient refused"}  LABORATORY TESTING:  Health maintenance labs ordered today as discussed above.   The natural history of prostate cancer and ongoing controversy regarding screening and potential treatment outcomes of prostate cancer has been discussed with the patient. The meaning of a false positive PSA and a false negative PSA has been discussed. He indicates understanding of the limitations of this screening test and wishes *** to proceed with screening PSA testing.   IMMUNIZATIONS:   - Tdap: Tetanus vaccination status reviewed: {tetanus status:315746}. - Influenza: {Blank single:19197::"Up to date","Administered today","Postponed to flu season","Refused","Given elsewhere"} - Pneumovax: {Blank single:19197::"Up to date","Administered today","Not applicable","Refused","Given elsewhere"} -  Prevnar: {Blank single:19197::"Up to date","Administered today","Not applicable","Refused","Given elsewhere"} - Zostavax vaccine: {Blank single:19197::"Up to date","Administered today","Not applicable","Refused","Given elsewhere"}  SCREENING: - Colonoscopy: {Blank single:19197::"Up to date","Ordered today","Not applicable","Refused","Done elsewhere"}  Discussed with patient purpose of the colonoscopy is to detect colon cancer at curable precancerous or early stages   - AAA Screening: {Blank single:19197::"Up to date","Ordered today","Not applicable","Refused","Done elsewhere"}  -Hearing Test: {Blank single:19197::"Up to date","Ordered today","Not applicable","Refused","Done elsewhere"}  -Spirometry: {Blank single:19197::"Up to date","Ordered today","Not applicable","Refused","Done elsewhere"}   PATIENT COUNSELING:    Sexuality: Discussed sexually transmitted diseases, partner selection, use of condoms, avoidance of unintended pregnancy  and contraceptive alternatives.   Advised to avoid cigarette smoking.  I discussed with the patient that most people either abstain from alcohol or drink within safe limits (<=14/week and <=4 drinks/occasion for males, <=7/weeks and <= 3 drinks/occasion for females) and that the risk for alcohol disorders and other health effects rises proportionally with the number of drinks per week and how often a drinker exceeds daily limits.  Discussed cessation/primary prevention of drug use and availability of treatment for abuse.   Diet: Encouraged to adjust caloric intake to maintain  or achieve ideal body weight, to reduce intake of dietary saturated fat and total fat, to limit sodium intake by avoiding high sodium foods and not adding table salt, and to maintain adequate dietary potassium and calcium preferably from fresh fruits, vegetables, and low-fat dairy products.    stressed the importance of regular exercise  Injury prevention: Discussed safety belts, safety  helmets, smoke detector, smoking near bedding or upholstery.   Dental health: Discussed importance of regular tooth brushing, flossing, and dental visits.   Follow up plan: NEXT PREVENTATIVE PHYSICAL DUE IN 1 YEAR. No follow-ups on file.

## 2023-08-11 ENCOUNTER — Encounter: Payer: Self-pay | Admitting: Nurse Practitioner

## 2023-08-11 ENCOUNTER — Ambulatory Visit: Payer: Medicare Other | Admitting: Nurse Practitioner

## 2023-08-11 VITALS — BP 125/72 | HR 45 | Temp 97.7°F | Ht 71.0 in | Wt 217.8 lb

## 2023-08-11 DIAGNOSIS — I10 Essential (primary) hypertension: Secondary | ICD-10-CM

## 2023-08-11 DIAGNOSIS — E669 Obesity, unspecified: Secondary | ICD-10-CM | POA: Diagnosis not present

## 2023-08-11 DIAGNOSIS — Z23 Encounter for immunization: Secondary | ICD-10-CM | POA: Diagnosis not present

## 2023-08-11 DIAGNOSIS — R7303 Prediabetes: Secondary | ICD-10-CM

## 2023-08-11 DIAGNOSIS — Z7189 Other specified counseling: Secondary | ICD-10-CM

## 2023-08-11 DIAGNOSIS — D649 Anemia, unspecified: Secondary | ICD-10-CM

## 2023-08-11 DIAGNOSIS — E785 Hyperlipidemia, unspecified: Secondary | ICD-10-CM | POA: Diagnosis not present

## 2023-08-11 DIAGNOSIS — I700 Atherosclerosis of aorta: Secondary | ICD-10-CM | POA: Diagnosis not present

## 2023-08-11 DIAGNOSIS — Z6830 Body mass index (BMI) 30.0-30.9, adult: Secondary | ICD-10-CM

## 2023-08-11 DIAGNOSIS — K631 Perforation of intestine (nontraumatic): Secondary | ICD-10-CM

## 2023-08-11 DIAGNOSIS — E66811 Obesity, class 1: Secondary | ICD-10-CM

## 2023-08-11 DIAGNOSIS — Z0000 Encounter for general adult medical examination without abnormal findings: Secondary | ICD-10-CM | POA: Diagnosis not present

## 2023-08-11 HISTORY — DX: Other specified counseling: Z71.89

## 2023-08-11 LAB — URINALYSIS, ROUTINE W REFLEX MICROSCOPIC
Bilirubin, UA: NEGATIVE
Glucose, UA: NEGATIVE
Ketones, UA: NEGATIVE
Leukocytes,UA: NEGATIVE
Nitrite, UA: NEGATIVE
Protein,UA: NEGATIVE
RBC, UA: NEGATIVE
Specific Gravity, UA: 1.025 (ref 1.005–1.030)
Urobilinogen, Ur: 1 mg/dL (ref 0.2–1.0)
pH, UA: 6.5 (ref 5.0–7.5)

## 2023-08-11 MED ORDER — METOPROLOL SUCCINATE ER 25 MG PO TB24: 25.0000 mg | ORAL_TABLET | Freq: Every day | ORAL | 1 refills | Status: DC

## 2023-08-11 MED ORDER — ATORVASTATIN CALCIUM 20 MG PO TABS: 20.0000 mg | ORAL_TABLET | Freq: Every day | ORAL | 1 refills | Status: DC

## 2023-08-11 MED ORDER — PANTOPRAZOLE SODIUM 40 MG PO TBEC: 40.0000 mg | DELAYED_RELEASE_TABLET | Freq: Every day | ORAL | 1 refills | Status: DC

## 2023-08-11 MED ORDER — VALSARTAN-HYDROCHLOROTHIAZIDE 160-25 MG PO TABS: 1.0000 | ORAL_TABLET | Freq: Every day | ORAL | 1 refills | Status: DC

## 2023-08-11 NOTE — Assessment & Plan Note (Signed)
Labs ordered at visit today.  Will make recommendations based on lab results.   

## 2023-08-11 NOTE — Assessment & Plan Note (Signed)
Resolved.  Doing well since surgery.  Would like to continue with pantoprazole.  Refill sent today.

## 2023-08-11 NOTE — Assessment & Plan Note (Signed)
Chronic.  Controlled.  Continue with current medication regimen of Atorvastatin 20mg  daily.  Refill sent today.  Labs ordered today.  Return to clinic in 6 months for reevaluation.  Call sooner if concerns arise.  ? ?

## 2023-08-11 NOTE — Assessment & Plan Note (Signed)
Recommended eating smaller high protein, low fat meals more frequently and exercising 30 mins a day 5 times a week with a goal of 10-15lb weight loss in the next 3 months.  

## 2023-08-11 NOTE — Assessment & Plan Note (Signed)
Continue statin. Recheck lipid panel today. Will make recommendations based on lab results.  Refills sent today. Follow up in 6 months.  Call sooner if concerns arise.

## 2023-08-11 NOTE — Assessment & Plan Note (Signed)
A voluntary discussion about advance care planning including the explanation and discussion of advance directives was extensively discussed  with the patient for 5 minutes with patient.   Explanation about the health care proxy and Living will was reviewed and packet with forms with explanation of how to fill them out was given.  During this discussion, the patient was able to identify a health care proxy as his son Sherod Cisse and does not make to with changes to his current living will at this time.

## 2023-08-11 NOTE — Assessment & Plan Note (Addendum)
Chronic.  Controlled.  Elevated at visit.  But checks regularly at home and running 120s/70s consistently. Continue with current medication regimen of Valsartan/HCTZ.  Refills sent today. Return to clinic in 6 months for reevaluation.  Call sooner if concerns arise.   HR has been running 40s-50s.  Decreased dose of Metoprolol from 50mg  to 25mg .  Discussed signs of hypotension.

## 2023-08-11 NOTE — Assessment & Plan Note (Signed)
Labs ordered at visit today.  Will make recommendations based on lab results.  Last A1c was 5.9% in February 2024.

## 2023-08-12 LAB — CBC WITH DIFFERENTIAL/PLATELET
Basophils Absolute: 0.1 10*3/uL (ref 0.0–0.2)
Basos: 1 %
EOS (ABSOLUTE): 0.3 10*3/uL (ref 0.0–0.4)
Eos: 4 %
Hematocrit: 44.9 % (ref 37.5–51.0)
Hemoglobin: 14.9 g/dL (ref 13.0–17.7)
Immature Grans (Abs): 0 10*3/uL (ref 0.0–0.1)
Immature Granulocytes: 0 %
Lymphocytes Absolute: 2.9 10*3/uL (ref 0.7–3.1)
Lymphs: 37 %
MCH: 31.5 pg (ref 26.6–33.0)
MCHC: 33.2 g/dL (ref 31.5–35.7)
MCV: 95 fL (ref 79–97)
Monocytes Absolute: 0.7 10*3/uL (ref 0.1–0.9)
Monocytes: 9 %
Neutrophils Absolute: 3.8 10*3/uL (ref 1.4–7.0)
Neutrophils: 49 %
Platelets: 188 10*3/uL (ref 150–450)
RBC: 4.73 x10E6/uL (ref 4.14–5.80)
RDW: 12.2 % (ref 11.6–15.4)
WBC: 7.8 10*3/uL (ref 3.4–10.8)

## 2023-08-12 LAB — COMPREHENSIVE METABOLIC PANEL
ALT: 20 [IU]/L (ref 0–44)
AST: 23 [IU]/L (ref 0–40)
Albumin: 4.2 g/dL (ref 3.8–4.8)
Alkaline Phosphatase: 117 [IU]/L (ref 44–121)
BUN/Creatinine Ratio: 17 (ref 10–24)
BUN: 18 mg/dL (ref 8–27)
Bilirubin Total: 0.7 mg/dL (ref 0.0–1.2)
CO2: 26 mmol/L (ref 20–29)
Calcium: 9.5 mg/dL (ref 8.6–10.2)
Chloride: 100 mmol/L (ref 96–106)
Creatinine, Ser: 1.04 mg/dL (ref 0.76–1.27)
Globulin, Total: 2.9 g/dL (ref 1.5–4.5)
Glucose: 118 mg/dL — ABNORMAL HIGH (ref 70–99)
Potassium: 3.5 mmol/L (ref 3.5–5.2)
Sodium: 141 mmol/L (ref 134–144)
Total Protein: 7.1 g/dL (ref 6.0–8.5)
eGFR: 74 mL/min/{1.73_m2} (ref >59)

## 2023-08-12 LAB — LIPID PANEL
Chol/HDL Ratio: 2.8 {ratio} (ref 0.0–5.0)
Cholesterol, Total: 168 mg/dL (ref 100–199)
HDL: 61 mg/dL (ref >39)
LDL Chol Calc (NIH): 90 mg/dL (ref 0–99)
Triglycerides: 94 mg/dL (ref 0–149)
VLDL Cholesterol Cal: 17 mg/dL (ref 5–40)

## 2023-08-12 LAB — HEMOGLOBIN A1C
Est. average glucose Bld gHb Est-mCnc: 123 mg/dL
Hgb A1c MFr Bld: 5.9 % — ABNORMAL HIGH (ref 4.8–5.6)

## 2023-08-12 LAB — PSA: Prostate Specific Ag, Serum: 1.1 ng/mL (ref 0.0–4.0)

## 2023-08-12 LAB — TSH: TSH: 1.76 u[IU]/mL (ref 0.450–4.500)

## 2023-08-12 NOTE — Progress Notes (Signed)
Hi Drew Mcmahon. It was nice to see you yesterday.  Your lab work looks good.  A1c remains well controlled at 5.9%.  No concerns at this time. Continue with your current medication regimen.  Follow up as discussed.  Please let me know if you have any questions.

## 2023-10-19 ENCOUNTER — Other Ambulatory Visit: Payer: Self-pay | Admitting: Nurse Practitioner

## 2023-10-20 NOTE — Telephone Encounter (Signed)
Requested Prescriptions  Pending Prescriptions Disp Refills   pantoprazole (PROTONIX) 40 MG tablet [Pharmacy Med Name: Pantoprazole Sodium 40 MG Oral Tablet Delayed Release] 100 tablet 0    Sig: TAKE 1 TABLET BY MOUTH DAILY     Gastroenterology: Proton Pump Inhibitors Passed - 10/20/2023 11:26 AM      Passed - Valid encounter within last 12 months    Recent Outpatient Visits           2 months ago Encounter for Medicare annual wellness exam   [place] [facility] Larae Grooms, NP   9 months ago Hospital discharge follow-up   [facility] Larae Grooms, NP   10 months ago Abdominal pain, unspecified abdominal location   [facility] Larae Grooms, NP   1 year ago COVID-19   [facility] Larae Grooms, NP   1 year ago Right foot pain   [facility] Larae Grooms, NP       Future Appointments             In 3 months Larae Grooms, NP [place] [facility]             metoprolol succinate (TOPROL-XL) 25 MG 24 hr tablet [Pharmacy Med Name: Metoprolol Succinate ER 25 MG Oral Tablet Extended Release 24 Hour] 100 tablet 0    Sig: TAKE 1 TABLET BY MOUTH DAILY     Cardiovascular:  Beta Blockers Passed - 10/20/2023 11:26 AM      Passed - Last BP in normal range    BP Readings from Last 1 Encounters:  08/11/23 125/72         Passed - Last Heart Rate in normal range    Pulse Readings from Last 1 Encounters:  08/11/23 (!) 45         Passed - Valid encounter within last 6 months    Recent Outpatient Visits           2 months ago Encounter for [employer] annual wellness exam   [place] Larae Grooms, NP   9 months ago Hospital discharge follow-up   [facility] Larae Grooms, NP   10 months ago Abdominal pain, unspecified abdominal location   [facility] Larae Grooms, NP   1 year ago COVID-19   [place] [facility] Larae Grooms, NP   1 year ago Right foot pain   [place] [facility] Larae Grooms, NP       Future Appointments             In 3 months Larae Grooms, NP [place] [facility]

## 2023-11-18 ENCOUNTER — Other Ambulatory Visit: Payer: Self-pay | Admitting: Nurse Practitioner

## 2023-11-18 DIAGNOSIS — I10 Essential (primary) hypertension: Secondary | ICD-10-CM

## 2023-11-18 DIAGNOSIS — E785 Hyperlipidemia, unspecified: Secondary | ICD-10-CM

## 2023-11-18 NOTE — Telephone Encounter (Signed)
Requested Prescriptions  Pending Prescriptions Disp Refills   valsartan-hydrochlorothiazide (DIOVAN-HCT) 160-25 MG tablet [Pharmacy Med Name: Valsartan-hydroCHLOROthiazide 160-25 MG Oral Tablet] 100 tablet 2    Sig: TAKE 1 TABLET BY MOUTH DAILY     Cardiovascular: ARB + Diuretic Combos Passed - 11/18/2023 11:34 AM      Passed - K in normal range and within 180 days    Potassium  Date Value Ref Range Status  08/11/2023 3.5 3.5 - 5.2 mmol/L Final         Passed - Na in normal range and within 180 days    Sodium  Date Value Ref Range Status  08/11/2023 141 134 - 144 mmol/L Final         Passed - Cr in normal range and within 180 days    Creat  Date Value Ref Range Status  07/29/2019 1.03 0.70 - 1.18 mg/dL Final    Comment:    For patients >38 years of age, the reference limit for Creatinine is approximately 13% higher for people identified as African-American. .    Creatinine, Ser  Date Value Ref Range Status  08/11/2023 1.04 0.76 - 1.27 mg/dL Final         Passed - eGFR is 10 or above and within 180 days    GFR, Est African American  Date Value Ref Range Status  07/29/2019 84 > OR = 60 mL/min/1.3m2 Final   GFR calc Af Amer  Date Value Ref Range Status  09/25/2020 98 >59 mL/min/1.73 Final    Comment:    **In accordance with recommendations from the NKF-ASN Task force,**   [facility] is in the process of updating its eGFR calculation to the   2021 CKD-EPI creatinine equation that estimates kidney function   without a race variable.    GFR, Est Non African American  Date Value Ref Range Status  07/29/2019 72 > OR = 60 mL/min/1.15m2 Final   GFR, Estimated  Date Value Ref Range Status  12/26/2022 >60 >60 mL/min Final    Comment:    (NOTE) Calculated using the CKD-EPI Creatinine Equation (2021)    eGFR  Date Value Ref Range Status  08/11/2023 74 >59 mL/min/1.73 Final         Passed - Patient is not pregnant      Passed - Last BP in normal range    BP  Readings from Last 1 Encounters:  08/11/23 125/72         Passed - Valid encounter within last 6 months    Recent Outpatient Visits           3 months ago Encounter for [employer] annual wellness exam   [place] [facility] Larae Grooms, NP   10 months ago Hospital discharge follow-up   [facility] Larae Grooms, NP   11 months ago Abdominal pain, unspecified abdominal location   [facility] Larae Grooms, NP   1 year ago COVID-19   [place] [facility] Larae Grooms, NP   1 year ago Right foot pain   [facility] Larae Grooms, NP       Future Appointments             In 2 months Larae Grooms, NP [facility]             atorvastatin (LIPITOR) 20 MG tablet [Pharmacy Med Name: Atorvastatin Calcium 20 MG Oral Tablet] 100 tablet 2  Sig: TAKE 1 TABLET BY MOUTH AT  BEDTIME     Cardiovascular:  Antilipid - Statins Failed - 11/18/2023 11:34 AM      Failed - Lipid Panel in normal range within the last 12 months    Cholesterol, Total  Date Value Ref Range Status  08/11/2023 168 100 - 199 mg/dL Final   Cholesterol Piccolo, Waived  Date Value Ref Range Status  03/04/2016 205 (H) <200 mg/dL Final    Comment:                            Desirable                <200                         Borderline High      200- 239                         High                     >239    LDL Cholesterol (Calc)  Date Value Ref Range Status  07/29/2019 72 mg/dL (calc) Final    Comment:    Reference range: <100 . Desirable range <100 mg/dL for primary prevention;   <70 mg/dL for patients with CHD or diabetic patients  with > or = 2 CHD risk factors. Marland Kitchen LDL-C is now calculated using the Martin-Hopkins  calculation, which is a validated novel method providing  better accuracy than the Friedewald equation in the  estimation of  LDL-C.  Horald Pollen et al. Lenox Ahr. 6010;932(35): 2061-2068  (http://education.[employer].com/faq/FAQ164)    LDL Chol Calc (NIH)  Date Value Ref Range Status  08/11/2023 90 0 - 99 mg/dL Final   HDL  Date Value Ref Range Status  08/11/2023 61 >39 mg/dL Final   Triglycerides  Date Value Ref Range Status  08/11/2023 94 0 - 149 mg/dL Final   Triglycerides Piccolo,Waived  Date Value Ref Range Status  03/04/2016 89 <150 mg/dL Final    Comment:                            Normal                   <150                         Borderline High     150 - 199                         High                200 - 499                         Very High                >499          Passed - Patient is not pregnant      Passed - Valid encounter within last 12 months    Recent Outpatient Visits           3 months ago Encounter for [employer] annual wellness exam   [place] [facility],  Clydie Braun, NP   10 months ago Hospital discharge follow-up   [facility] Larae Grooms, NP   11 months ago Abdominal pain, unspecified abdominal location   [facility] Larae Grooms, NP   1 year ago COVID-19   [facility] Larae Grooms, NP   1 year ago Right foot pain   [facility] Larae Grooms, NP       Future Appointments             In 2 months Larae Grooms, NP [facility]

## 2024-02-09 ENCOUNTER — Ambulatory Visit: Payer: Self-pay | Admitting: Nurse Practitioner

## 2024-02-18 ENCOUNTER — Ambulatory Visit
Admission: RE | Admit: 2024-02-18 | Discharge: 2024-02-18 | Disposition: A | Discharge status: Home / Self Care | Source: Ambulatory Visit | Attending: Family Medicine | Admitting: Family Medicine

## 2024-02-18 ENCOUNTER — Ambulatory Visit (INDEPENDENT_AMBULATORY_CARE_PROVIDER_SITE_OTHER): Admitting: Family Medicine

## 2024-02-18 ENCOUNTER — Ambulatory Visit
Admission: RE | Admit: 2024-02-18 | Discharge: 2024-02-18 | Disposition: A | Discharge status: Home / Self Care | Attending: Family Medicine | Admitting: Family Medicine

## 2024-02-18 ENCOUNTER — Encounter: Payer: Self-pay | Admitting: Family Medicine

## 2024-02-18 VITALS — BP 147/79 | HR 51 | Ht 72.0 in | Wt 219.2 lb

## 2024-02-18 DIAGNOSIS — M25561 Pain in right knee: Secondary | ICD-10-CM

## 2024-02-18 DIAGNOSIS — M25461 Effusion, right knee: Secondary | ICD-10-CM | POA: Diagnosis not present

## 2024-02-18 DIAGNOSIS — F4321 Adjustment disorder with depressed mood: Secondary | ICD-10-CM | POA: Diagnosis not present

## 2024-02-18 DIAGNOSIS — M1711 Unilateral primary osteoarthritis, right knee: Secondary | ICD-10-CM | POA: Diagnosis not present

## 2024-02-18 MED ORDER — DICLOFENAC SODIUM 1 % EX GEL: 4.0000 g | Freq: Four times a day (QID) | CUTANEOUS | 1 refills | Status: AC

## 2024-02-18 NOTE — Progress Notes (Signed)
 BP (!) 147/79 (BP Location: Left Arm, Patient Position: Sitting, Cuff Size: Normal)   Pulse (!) 51   Ht 6' (1.829 m)   Wt 219 lb 3.2 oz (99.4 kg)   SpO2 98%   BMI 29.73 kg/m    Subjective:    Patient ID: Drew Mcmahon, male    DOB: 1946-01-17, 78 y.o.   MRN: [id number]  HPI: Drew Mcmahon is a 78 y.o. male  Chief Complaint  Patient presents with   Knee Pain    Right knee. Pt states that pain started about a month and has gotten worse. OTC meds with no relief. No known cause.   KNEE PAIN Duration: about a month Involved knee: right Mechanism of injury: unknown Location:lateral- above the knee into the thigh Onset: sudden Severity: moderate to severe  Quality:  sharp occasionally with usually aching Frequency: constant with occasional sharp pains Radiation: no Aggravating factors: being on it, getting up from a seated position  Alleviating factors: nothing  Status: stable Treatments attempted: rest, heat, and APAP  Relief with NSAIDs?:  No NSAIDs Taken Weakness with weight bearing or walking: no Sensation of giving way: yes Locking: no Popping: yes Bruising: no Swelling: yes Redness: no Paresthesias/decreased sensation: no Fevers: no   Relevant past medical, surgical, family and social history reviewed and updated as indicated. Interim medical history since our last visit reviewed. Allergies and medications reviewed and updated.  Review of Systems  Constitutional: Negative.   Respiratory: Negative.    Cardiovascular: Negative.   Musculoskeletal:  Positive for arthralgias. Negative for back pain, gait problem, joint swelling, myalgias, neck pain and neck stiffness.  Skin: Negative.   Psychiatric/Behavioral:  Positive for sleep disturbance. Negative for agitation, behavioral problems, confusion, decreased concentration, dysphoric mood, hallucinations, self-injury and suicidal ideas. The patient is not nervous/anxious and is not hyperactive.     Per HPI unless  specifically indicated above     Objective:    BP (!) 147/79 (BP Location: Left Arm, Patient Position: Sitting, Cuff Size: Normal)   Pulse (!) 51   Ht 6' (1.829 m)   Wt 219 lb 3.2 oz (99.4 kg)   SpO2 98%   BMI 29.73 kg/m   Wt Readings from Last 3 Encounters:  02/18/24 219 lb 3.2 oz (99.4 kg)  08/11/23 217 lb 12.8 oz (98.8 kg)  01/08/23 207 lb (93.9 kg)    Physical Exam Vitals and nursing note reviewed.  Constitutional:      General: He is not in acute distress.    Appearance: Normal appearance. He is not ill-appearing, toxic-appearing or diaphoretic.  HENT:     Head: Normocephalic and atraumatic.     Right Ear: External ear normal.     Left Ear: External ear normal.     Nose: Nose normal.     Mouth/Throat:     Mouth: Mucous membranes are moist.     Pharynx: Oropharynx is clear.  Eyes:     General: No scleral icterus.       Right eye: No discharge.        Left eye: No discharge.     Extraocular Movements: Extraocular movements intact.     Conjunctiva/sclera: Conjunctivae normal.     Pupils: Pupils are equal, round, and reactive to light.  Cardiovascular:     Rate and Rhythm: Normal rate and regular rhythm.     Pulses: Normal pulses.     Heart sounds: Normal heart sounds. No murmur heard.    No friction  rub. No gallop.  Pulmonary:     Effort: Pulmonary effort is normal. No respiratory distress.     Breath sounds: Normal breath sounds. No stridor. No wheezing, rhonchi or rales.  Chest:     Chest wall: No tenderness.  Musculoskeletal:        General: Swelling and tenderness (R knee, especially at R hamstring attachment) present. No deformity or signs of injury.     Cervical back: Normal range of motion and neck supple.     Right lower leg: No edema.     Left lower leg: No edema.  Skin:    General: Skin is warm and dry.     Capillary Refill: Capillary refill takes less than 2 seconds.     Coloration: Skin is not jaundiced or pale.     Findings: No bruising,  erythema, lesion or rash.  Neurological:     General: No focal deficit present.     Mental Status: He is alert and oriented to person, place, and time. Mental status is at baseline.  Psychiatric:        Mood and Affect: Mood normal.        Behavior: Behavior normal.        Thought Content: Thought content normal.        Judgment: Judgment normal.     Results for orders placed or performed in visit on 08/11/23  Urinalysis, Routine w reflex microscopic   Collection Time: 08/11/23  8:34 AM  Result Value Ref Range   Specific Gravity, UA 1.025 1.005 - 1.030   pH, UA 6.5 5.0 - 7.5   Color, UA Yellow Yellow   Appearance Ur Clear Clear   Leukocytes,UA Negative Negative   Protein,UA Negative Negative/Trace   Glucose, UA Negative Negative   Ketones, UA Negative Negative   RBC, UA Negative Negative   Bilirubin, UA Negative Negative   Urobilinogen, Ur 1.0 0.2 - 1.0 mg/dL   Nitrite, UA Negative Negative   Microscopic Examination Comment   TSH   Collection Time: 08/11/23  8:35 AM  Result Value Ref Range   TSH 1.760 0.450 - 4.500 uIU/mL  PSA   Collection Time: 08/11/23  8:35 AM  Result Value Ref Range   Prostate Specific Ag, Serum 1.1 0.0 - 4.0 ng/mL  Lipid panel   Collection Time: 08/11/23  8:35 AM  Result Value Ref Range   Cholesterol, Total 168 100 - 199 mg/dL   Triglycerides 94 0 - 149 mg/dL   HDL 61 >60 mg/dL   VLDL Cholesterol Cal 17 5 - 40 mg/dL   LDL Chol Calc (NIH) 90 0 - 99 mg/dL   Chol/HDL Ratio 2.8 0.0 - 5.0 ratio  CBC with Differential/Platelet   Collection Time: 08/11/23  8:35 AM  Result Value Ref Range   WBC 7.8 3.4 - 10.8 x10E3/uL   RBC 4.73 4.14 - 5.80 x10E6/uL   Hemoglobin 14.9 13.0 - 17.7 g/dL   Hematocrit 45.4 09.8 - 51.0 %   MCV 95 79 - 97 fL   MCH 31.5 26.6 - 33.0 pg   MCHC 33.2 31.5 - 35.7 g/dL   RDW 11.9 14.7 - 82.9 %   Platelets 188 150 - 450 x10E3/uL   Neutrophils 49 Not Estab. %   Lymphs 37 Not Estab. %   Monocytes 9 Not Estab. %   Eos 4 Not  Estab. %   Basos 1 Not Estab. %   Neutrophils Absolute 3.8 1.4 - 7.0 x10E3/uL   Lymphocytes Absolute 2.9  0.7 - 3.1 x10E3/uL   Monocytes Absolute 0.7 0.1 - 0.9 x10E3/uL   EOS (ABSOLUTE) 0.3 0.0 - 0.4 x10E3/uL   Basophils Absolute 0.1 0.0 - 0.2 x10E3/uL   Immature Granulocytes 0 Not Estab. %   Immature Grans (Abs) 0.0 0.0 - 0.1 x10E3/uL  Comprehensive metabolic panel   Collection Time: 08/11/23  8:35 AM  Result Value Ref Range   Glucose 118 (H) 70 - 99 mg/dL   BUN 18 8 - 27 mg/dL   Creatinine, Ser 9.14 0.76 - 1.27 mg/dL   eGFR 74 >78 GN/FAO/1.30   BUN/Creatinine Ratio 17 10 - 24   Sodium 141 134 - 144 mmol/L   Potassium 3.5 3.5 - 5.2 mmol/L   Chloride 100 96 - 106 mmol/L   CO2 26 20 - 29 mmol/L   Calcium  9.5 8.6 - 10.2 mg/dL   Total Protein 7.1 6.0 - 8.5 g/dL   Albumin 4.2 3.8 - 4.8 g/dL   Globulin, Total 2.9 1.5 - 4.5 g/dL   Bilirubin Total 0.7 0.0 - 1.2 mg/dL   Alkaline Phosphatase 117 44 - 121 IU/L   AST 23 0 - 40 IU/L   ALT 20 0 - 44 IU/L  HgB A1c   Collection Time: 08/11/23  8:35 AM  Result Value Ref Range   Hgb A1c MFr Bld 5.9 (H) 4.8 - 5.6 %   Est. average glucose Bld gHb Est-mCnc 123 mg/dL      Assessment & Plan:   Problem List Items Addressed This Visit   None Visit Diagnoses       Acute pain of right knee    -  Primary   Will start him on voltaren , will get x-ray and start stretches. Call with any concerns. Continue to monitor.   Relevant Orders   DG Knee Complete 4 Views Right     Adjustment disorder with depressed mood       Not doing well with his brother's illness. Encouraged melatonin. Call with any concerns.        Follow up plan: Return in about 4 weeks (around 03/17/2024) for wth Mariah Shines.

## 2024-02-19 ENCOUNTER — Telehealth: Payer: Self-pay

## 2024-02-19 ENCOUNTER — Encounter: Payer: Self-pay | Admitting: Family Medicine

## 2024-02-19 NOTE — Telephone Encounter (Signed)
 Called and notified patient wife that the prescription was sent in yesterday for the patient.

## 2024-02-19 NOTE — Telephone Encounter (Signed)
 Copied from CRM [id number]. Topic: Clinical - Prescription Issue >> Feb 19, 2024 10:54 AM Drew Mcmahon wrote: Reason for CRM: The patient's wife has called to follow up and confirm the submission of their diclofenac  Sodium (VOLTAREN ) 1 % GEL [[id number]] prescription to [facility]  Please contact the patient further when possible

## 2024-02-22 ENCOUNTER — Encounter: Payer: Self-pay | Admitting: Nurse Practitioner

## 2024-02-22 ENCOUNTER — Telehealth: Payer: Self-pay

## 2024-02-22 ENCOUNTER — Ambulatory Visit (INDEPENDENT_AMBULATORY_CARE_PROVIDER_SITE_OTHER): Admitting: Nurse Practitioner

## 2024-02-22 VITALS — BP 119/66 | HR 55 | Temp 98.2°F | Resp 17 | Ht 72.01 in | Wt 219.0 lb

## 2024-02-22 DIAGNOSIS — M13861 Other specified arthritis, right knee: Secondary | ICD-10-CM

## 2024-02-22 NOTE — Progress Notes (Unsigned)
 BP 119/66 (BP Location: Left Arm, Patient Position: Sitting, Cuff Size: Large)   Pulse (!) 55   Temp 98.2 F (36.8 C) (Oral)   Resp 17   Ht 6' 0.01" (1.829 m)   Wt 219 lb (99.3 kg)   SpO2 98%   BMI 29.70 kg/m    Subjective:    Patient ID: Drew Mcmahon, male    DOB: 1946-05-31, 78 y.o.   MRN: [id number]  HPI: Drew Mcmahon is a 78 y.o. male  Chief Complaint  Patient presents with   Knee Pain    Right knee   KNEE PAIN Ongoing knee pain- xray showed arthritis.  Here for knee injection today.  Duration: about a month Involved knee: right Mechanism of injury: unknown Location:lateral- above the knee into the thigh Onset: sudden Severity: moderate to severe  Quality:  sharp occasionally with usually aching Frequency: constant with occasional sharp pains Radiation: no Aggravating factors: being on it, getting up from a seated position  Alleviating factors: nothing  Status: stable Treatments attempted: rest, heat, and APAP  Relief with NSAIDs?:  No NSAIDs Taken Weakness with weight bearing or walking: no Sensation of giving way: yes Locking: no Popping: yes Bruising: no Swelling: yes Redness: no Paresthesias/decreased sensation: no Fevers: no   Relevant past medical, surgical, family and social history reviewed and updated as indicated. Interim medical history since our last visit reviewed. Allergies and medications reviewed and updated.  Review of Systems  Constitutional: Negative.   Respiratory: Negative.    Cardiovascular: Negative.   Musculoskeletal:  Positive for arthralgias. Negative for back pain, gait problem, joint swelling, myalgias, neck pain and neck stiffness.  Skin: Negative.     Per HPI unless specifically indicated above     Objective:    BP 119/66 (BP Location: Left Arm, Patient Position: Sitting, Cuff Size: Large)   Pulse (!) 55   Temp 98.2 F (36.8 C) (Oral)   Resp 17   Ht 6' 0.01" (1.829 m)   Wt 219 lb (99.3 kg)   SpO2 98%    BMI 29.70 kg/m   Wt Readings from Last 3 Encounters:  02/22/24 219 lb (99.3 kg)  02/18/24 219 lb 3.2 oz (99.4 kg)  08/11/23 217 lb 12.8 oz (98.8 kg)    Physical Exam Vitals and nursing note reviewed.  Constitutional:      General: He is not in acute distress.    Appearance: Normal appearance. He is not ill-appearing, toxic-appearing or diaphoretic.  HENT:     Head: Normocephalic and atraumatic.     Right Ear: External ear normal.     Left Ear: External ear normal.     Nose: Nose normal.     Mouth/Throat:     Mouth: Mucous membranes are moist.     Pharynx: Oropharynx is clear.  Eyes:     General: No scleral icterus.       Right eye: No discharge.        Left eye: No discharge.     Extraocular Movements: Extraocular movements intact.     Conjunctiva/sclera: Conjunctivae normal.     Pupils: Pupils are equal, round, and reactive to light.  Cardiovascular:     Rate and Rhythm: Normal rate and regular rhythm.     Pulses: Normal pulses.     Heart sounds: Normal heart sounds. No murmur heard.    No friction rub. No gallop.  Pulmonary:     Effort: Pulmonary effort is normal. No respiratory distress.  Breath sounds: Normal breath sounds. No stridor. No wheezing, rhonchi or rales.  Chest:     Chest wall: No tenderness.  Musculoskeletal:        General: Swelling and tenderness (R knee, especially at R hamstring attachment) present. No deformity or signs of injury.     Cervical back: Normal range of motion and neck supple.     Right lower leg: No edema.     Left lower leg: No edema.     Comments: Walking with a limp  Skin:    General: Skin is warm and dry.     Capillary Refill: Capillary refill takes less than 2 seconds.     Coloration: Skin is not jaundiced or pale.     Findings: No bruising, erythema, lesion or rash.  Neurological:     General: No focal deficit present.     Mental Status: He is alert and oriented to person, place, and time. Mental status is at baseline.   Psychiatric:        Mood and Affect: Mood normal.        Behavior: Behavior normal.        Thought Content: Thought content normal.        Judgment: Judgment normal.     Results for orders placed or performed in visit on 08/11/23  Urinalysis, Routine w reflex microscopic   Collection Time: 08/11/23  8:34 AM  Result Value Ref Range   Specific Gravity, UA 1.025 1.005 - 1.030   pH, UA 6.5 5.0 - 7.5   Color, UA Yellow Yellow   Appearance Ur Clear Clear   Leukocytes,UA Negative Negative   Protein,UA Negative Negative/Trace   Glucose, UA Negative Negative   Ketones, UA Negative Negative   RBC, UA Negative Negative   Bilirubin, UA Negative Negative   Urobilinogen, Ur 1.0 0.2 - 1.0 mg/dL   Nitrite, UA Negative Negative   Microscopic Examination Comment   TSH   Collection Time: 08/11/23  8:35 AM  Result Value Ref Range   TSH 1.760 0.450 - 4.500 uIU/mL  PSA   Collection Time: 08/11/23  8:35 AM  Result Value Ref Range   Prostate Specific Ag, Serum 1.1 0.0 - 4.0 ng/mL  Lipid panel   Collection Time: 08/11/23  8:35 AM  Result Value Ref Range   Cholesterol, Total 168 100 - 199 mg/dL   Triglycerides 94 0 - 149 mg/dL   HDL 61 >16 mg/dL   VLDL Cholesterol Cal 17 5 - 40 mg/dL   LDL Chol Calc (NIH) 90 0 - 99 mg/dL   Chol/HDL Ratio 2.8 0.0 - 5.0 ratio  CBC with Differential/Platelet   Collection Time: 08/11/23  8:35 AM  Result Value Ref Range   WBC 7.8 3.4 - 10.8 x10E3/uL   RBC 4.73 4.14 - 5.80 x10E6/uL   Hemoglobin 14.9 13.0 - 17.7 g/dL   Hematocrit 10.9 60.4 - 51.0 %   MCV 95 79 - 97 fL   MCH 31.5 26.6 - 33.0 pg   MCHC 33.2 31.5 - 35.7 g/dL   RDW 54.0 98.1 - 19.1 %   Platelets 188 150 - 450 x10E3/uL   Neutrophils 49 Not Estab. %   Lymphs 37 Not Estab. %   Monocytes 9 Not Estab. %   Eos 4 Not Estab. %   Basos 1 Not Estab. %   Neutrophils Absolute 3.8 1.4 - 7.0 x10E3/uL   Lymphocytes Absolute 2.9 0.7 - 3.1 x10E3/uL   Monocytes Absolute 0.7 0.1 - 0.9 x10E3/uL  EOS  (ABSOLUTE) 0.3 0.0 - 0.4 x10E3/uL   Basophils Absolute 0.1 0.0 - 0.2 x10E3/uL   Immature Granulocytes 0 Not Estab. %   Immature Grans (Abs) 0.0 0.0 - 0.1 x10E3/uL  Comprehensive metabolic panel   Collection Time: 08/11/23  8:35 AM  Result Value Ref Range   Glucose 118 (H) 70 - 99 mg/dL   BUN 18 8 - 27 mg/dL   Creatinine, Ser 4.09 0.76 - 1.27 mg/dL   eGFR 74 >81 XB/JYN/8.29   BUN/Creatinine Ratio 17 10 - 24   Sodium 141 134 - 144 mmol/L   Potassium 3.5 3.5 - 5.2 mmol/L   Chloride 100 96 - 106 mmol/L   CO2 26 20 - 29 mmol/L   Calcium  9.5 8.6 - 10.2 mg/dL   Total Protein 7.1 6.0 - 8.5 g/dL   Albumin 4.2 3.8 - 4.8 g/dL   Globulin, Total 2.9 1.5 - 4.5 g/dL   Bilirubin Total 0.7 0.0 - 1.2 mg/dL   Alkaline Phosphatase 117 44 - 121 IU/L   AST 23 0 - 40 IU/L   ALT 20 0 - 44 IU/L  HgB A1c   Collection Time: 08/11/23  8:35 AM  Result Value Ref Range   Hgb A1c MFr Bld 5.9 (H) 4.8 - 5.6 %   Est. average glucose Bld gHb Est-mCnc 123 mg/dL      Assessment & Plan:   Problem List Items Addressed This Visit   None Visit Diagnoses       Allergic arthritis of right knee    -  Primary   See procedure note below.      Procedure: Right  Knee Intraarticular Steroid Injection        Diagnosis:   ICD-10-CM   1. Allergic arthritis of right knee  M13.861    See procedure note below.      Physician: @ENCPROV @ Consent:  Risks, benefits, and alternative treatments discussed and all questions were answered.  Patient elected to proceed and verbal consent obtained.  Description: Area prepped and draped using  semi-sterile technique.  Using a anterior/lateral approach, a mixture of 4 cc of  1% lidocaine  & 1 cc of Kenalog 40 was injected into knee joint.  A bandage was then placed over the injection site. Complications: none Post Procedure Instructions: Wound care instructions discussed and patient was instructed to keep area clean and dry.  Signs and symptoms of infection discussed, patient  agrees to contact the office ASAP should they occur.  Follow Up: No follow-ups on file.     Follow up plan: No follow-ups on file.

## 2024-02-22 NOTE — Telephone Encounter (Signed)
 Copied from CRM [id number]. Topic: Appointments - Scheduling Inquiry for Clinic >> Feb 19, 2024  3:47 PM Baldemar Lev wrote: Reason for CRM: Pt wants to come on Monday to have an injection in his right knee per discussion with PCP. Please advise

## 2024-02-22 NOTE — Telephone Encounter (Signed)
 Called and scheduled patient an appointment for a knee injection per Dr. Lincoln Renshaw in imaging result notes.

## 2024-03-22 ENCOUNTER — Encounter: Payer: Self-pay | Admitting: Nurse Practitioner

## 2024-03-22 ENCOUNTER — Ambulatory Visit (INDEPENDENT_AMBULATORY_CARE_PROVIDER_SITE_OTHER): Admitting: Nurse Practitioner

## 2024-03-22 VITALS — BP 129/56 | HR 52 | Temp 98.2°F | Resp 15 | Ht 72.01 in | Wt 219.0 lb

## 2024-03-22 DIAGNOSIS — I10 Essential (primary) hypertension: Secondary | ICD-10-CM | POA: Diagnosis not present

## 2024-03-22 DIAGNOSIS — R7303 Prediabetes: Secondary | ICD-10-CM

## 2024-03-22 DIAGNOSIS — D649 Anemia, unspecified: Secondary | ICD-10-CM | POA: Diagnosis not present

## 2024-03-22 DIAGNOSIS — I700 Atherosclerosis of aorta: Secondary | ICD-10-CM | POA: Diagnosis not present

## 2024-03-22 DIAGNOSIS — E785 Hyperlipidemia, unspecified: Secondary | ICD-10-CM | POA: Diagnosis not present

## 2024-03-22 MED ORDER — PANTOPRAZOLE SODIUM 40 MG PO TBEC: 40.0000 mg | DELAYED_RELEASE_TABLET | Freq: Every day | ORAL | 1 refills | Status: DC

## 2024-03-22 MED ORDER — VALSARTAN-HYDROCHLOROTHIAZIDE 160-25 MG PO TABS: 1.0000 | ORAL_TABLET | Freq: Every day | ORAL | 1 refills | Status: DC

## 2024-03-22 MED ORDER — METOPROLOL SUCCINATE ER 25 MG PO TB24: 25.0000 mg | ORAL_TABLET | Freq: Every day | ORAL | 1 refills | Status: DC

## 2024-03-22 MED ORDER — ATORVASTATIN CALCIUM 20 MG PO TABS: 20.0000 mg | ORAL_TABLET | Freq: Every day | ORAL | 1 refills | Status: DC

## 2024-03-22 NOTE — Progress Notes (Deleted)
 There were no vitals taken for this visit.   Subjective:    Patient ID: Drew Mcmahon, male    DOB: 1946/05/02, 78 y.o.   MRN: [id number]  HPI: Drew Mcmahon is a 78 y.o. male  No chief complaint on file.  KNEE PAIN Ongoing knee pain- xray showed arthritis.  Here for knee injection today.  Duration: about a month Involved knee: right Mechanism of injury: unknown Location:lateral- above the knee into the thigh Onset: sudden Severity: moderate to severe  Quality:  sharp occasionally with usually aching Frequency: constant with occasional sharp pains Radiation: no Aggravating factors: being on it, getting up from a seated position  Alleviating factors: nothing  Status: stable Treatments attempted: rest, heat, and APAP  Relief with NSAIDs?:  No NSAIDs Taken Weakness with weight bearing or walking: no Sensation of giving way: yes Locking: no Popping: yes Bruising: no Swelling: yes Redness: no Paresthesias/decreased sensation: no Fevers: no   Relevant past medical, surgical, family and social history reviewed and updated as indicated. Interim medical history since our last visit reviewed. Allergies and medications reviewed and updated.  Review of Systems  Constitutional: Negative.   Respiratory: Negative.    Cardiovascular: Negative.   Musculoskeletal:  Positive for arthralgias. Negative for back pain, gait problem, joint swelling, myalgias, neck pain and neck stiffness.  Skin: Negative.     Per HPI unless specifically indicated above     Objective:    There were no vitals taken for this visit.  Wt Readings from Last 3 Encounters:  02/22/24 219 lb (99.3 kg)  02/18/24 219 lb 3.2 oz (99.4 kg)  08/11/23 217 lb 12.8 oz (98.8 kg)    Physical Exam Vitals and nursing note reviewed.  Constitutional:      General: He is not in acute distress.    Appearance: Normal appearance. He is not ill-appearing, toxic-appearing or diaphoretic.  HENT:     Head: Normocephalic  and atraumatic.     Right Ear: External ear normal.     Left Ear: External ear normal.     Nose: Nose normal.     Mouth/Throat:     Mouth: Mucous membranes are moist.     Pharynx: Oropharynx is clear.  Eyes:     General: No scleral icterus.       Right eye: No discharge.        Left eye: No discharge.     Extraocular Movements: Extraocular movements intact.     Conjunctiva/sclera: Conjunctivae normal.     Pupils: Pupils are equal, round, and reactive to light.  Cardiovascular:     Rate and Rhythm: Normal rate and regular rhythm.     Pulses: Normal pulses.     Heart sounds: Normal heart sounds. No murmur heard.    No friction rub. No gallop.  Pulmonary:     Effort: Pulmonary effort is normal. No respiratory distress.     Breath sounds: Normal breath sounds. No stridor. No wheezing, rhonchi or rales.  Chest:     Chest wall: No tenderness.  Musculoskeletal:        General: Swelling and tenderness (R knee, especially at R hamstring attachment) present. No deformity or signs of injury.     Cervical back: Normal range of motion and neck supple.     Right lower leg: No edema.     Left lower leg: No edema.     Comments: Walking with a limp  Skin:    General: Skin is warm and  dry.     Capillary Refill: Capillary refill takes less than 2 seconds.     Coloration: Skin is not jaundiced or pale.     Findings: No bruising, erythema, lesion or rash.  Neurological:     General: No focal deficit present.     Mental Status: He is alert and oriented to person, place, and time. Mental status is at baseline.  Psychiatric:        Mood and Affect: Mood normal.        Behavior: Behavior normal.        Thought Content: Thought content normal.        Judgment: Judgment normal.    Results for orders placed or performed in visit on 08/11/23  Urinalysis, Routine w reflex microscopic   Collection Time: 08/11/23  8:34 AM  Result Value Ref Range   Specific Gravity, UA 1.025 1.005 - 1.030   pH, UA  6.5 5.0 - 7.5   Color, UA Yellow Yellow   Appearance Ur Clear Clear   Leukocytes,UA Negative Negative   Protein,UA Negative Negative/Trace   Glucose, UA Negative Negative   Ketones, UA Negative Negative   RBC, UA Negative Negative   Bilirubin, UA Negative Negative   Urobilinogen, Ur 1.0 0.2 - 1.0 mg/dL   Nitrite, UA Negative Negative   Microscopic Examination Comment   TSH   Collection Time: 08/11/23  8:35 AM  Result Value Ref Range   TSH 1.760 0.450 - 4.500 uIU/mL  PSA   Collection Time: 08/11/23  8:35 AM  Result Value Ref Range   Prostate Specific Ag, Serum 1.1 0.0 - 4.0 ng/mL  Lipid panel   Collection Time: 08/11/23  8:35 AM  Result Value Ref Range   Cholesterol, Total 168 100 - 199 mg/dL   Triglycerides 94 0 - 149 mg/dL   HDL 61 >81 mg/dL   VLDL Cholesterol Cal 17 5 - 40 mg/dL   LDL Chol Calc (NIH) 90 0 - 99 mg/dL   Chol/HDL Ratio 2.8 0.0 - 5.0 ratio  CBC with Differential/Platelet   Collection Time: 08/11/23  8:35 AM  Result Value Ref Range   WBC 7.8 3.4 - 10.8 x10E3/uL   RBC 4.73 4.14 - 5.80 x10E6/uL   Hemoglobin 14.9 13.0 - 17.7 g/dL   Hematocrit 19.1 47.8 - 51.0 %   MCV 95 79 - 97 fL   MCH 31.5 26.6 - 33.0 pg   MCHC 33.2 31.5 - 35.7 g/dL   RDW 29.5 62.1 - 30.8 %   Platelets 188 150 - 450 x10E3/uL   Neutrophils 49 Not Estab. %   Lymphs 37 Not Estab. %   Monocytes 9 Not Estab. %   Eos 4 Not Estab. %   Basos 1 Not Estab. %   Neutrophils Absolute 3.8 1.4 - 7.0 x10E3/uL   Lymphocytes Absolute 2.9 0.7 - 3.1 x10E3/uL   Monocytes Absolute 0.7 0.1 - 0.9 x10E3/uL   EOS (ABSOLUTE) 0.3 0.0 - 0.4 x10E3/uL   Basophils Absolute 0.1 0.0 - 0.2 x10E3/uL   Immature Granulocytes 0 Not Estab. %   Immature Grans (Abs) 0.0 0.0 - 0.1 x10E3/uL  Comprehensive metabolic panel   Collection Time: 08/11/23  8:35 AM  Result Value Ref Range   Glucose 118 (H) 70 - 99 mg/dL   BUN 18 8 - 27 mg/dL   Creatinine, Ser 6.57 0.76 - 1.27 mg/dL   eGFR 74 >84 ON/GEX/5.28   BUN/Creatinine  Ratio 17 10 - 24   Sodium 141 134 -  144 mmol/L   Potassium 3.5 3.5 - 5.2 mmol/L   Chloride 100 96 - 106 mmol/L   CO2 26 20 - 29 mmol/L   Calcium  9.5 8.6 - 10.2 mg/dL   Total Protein 7.1 6.0 - 8.5 g/dL   Albumin 4.2 3.8 - 4.8 g/dL   Globulin, Total 2.9 1.5 - 4.5 g/dL   Bilirubin Total 0.7 0.0 - 1.2 mg/dL   Alkaline Phosphatase 117 44 - 121 IU/L   AST 23 0 - 40 IU/L   ALT 20 0 - 44 IU/L  HgB A1c   Collection Time: 08/11/23  8:35 AM  Result Value Ref Range   Hgb A1c MFr Bld 5.9 (H) 4.8 - 5.6 %   Est. average glucose Bld gHb Est-mCnc 123 mg/dL      Assessment & Plan:   Problem List Items Addressed This Visit   None   Procedure: Right  Knee Intraarticular Steroid Injection        Diagnosis: No diagnosis found.   Physician: @ENCPROV @ Consent:  Risks, benefits, and alternative treatments discussed and all questions were answered.  Patient elected to proceed and verbal consent obtained.  Description: Area prepped and draped using  semi-sterile technique.  Using a anterior/lateral approach, a mixture of 4 cc of  1% lidocaine  & 1 cc of Kenalog 40 was injected into knee joint.  A bandage was then placed over the injection site. Complications: none Post Procedure Instructions: Wound care instructions discussed and patient was instructed to keep area clean and dry.  Signs and symptoms of infection discussed, patient agrees to contact the office ASAP should they occur.  Follow Up: No follow-ups on file.     Follow up plan: No follow-ups on file.

## 2024-03-22 NOTE — Progress Notes (Signed)
 BP (!) 129/56 (BP Location: Left Arm, Patient Position: Sitting, Cuff Size: Normal)   Pulse (!) 52   Temp 98.2 F (36.8 C) (Oral)   Resp 15   Ht 6' 0.01" (1.829 m)   Wt 219 lb (99.3 kg)   SpO2 98%   BMI 29.70 kg/m    Subjective:    Patient ID: Drew Mcmahon, male    DOB: June 01, 1946, 78 y.o.   MRN: [id number]  HPI: Drew Mcmahon is a 78 y.o. male  Chief Complaint  Patient presents with   Knee Pain    Had injection on 02/22/2024. Got better then got bad again and now using heat pad.    HYPERTENSION / HYPERLIPIDEMIA Satisfied with current treatment? yes Duration of hypertension: years BP monitoring frequency: daily; multiple times a day BP range: 125/72 BP medication side effects: no Past BP meds: valsartan -HCTZ and Metoprolol  Duration of hyperlipidemia: years Cholesterol medication side effects: no Cholesterol supplements: none Past cholesterol medications: atorvastain (lipitor)  Medication compliance: excellent compliance Aspirin : no Recent stressors: no Recurrent headaches: no  Visual changes: no Palpitations: no Dyspnea: no Chest pain: no Lower extremity edema: no Dizzy/lightheaded: no HR runs low 50s at home.   Patient states his knee pain comes and goes.  After he had the shot it helped for 3 days.  States it is feeling pretty good since he has been off of it due to a stomach bug.     Relevant past medical, surgical, family and social history reviewed and updated as indicated. Interim medical history since our last visit reviewed. Allergies and medications reviewed and updated.  Review of Systems  Eyes:  Negative for visual disturbance.  Respiratory:  Negative for shortness of breath.   Cardiovascular:  Negative for chest pain and leg swelling.  Neurological:  Negative for light-headedness and headaches.    Per HPI unless specifically indicated above     Objective:     BP (!) 129/56 (BP Location: Left Arm, Patient Position: Sitting, Cuff Size:  Normal)   Pulse (!) 52   Temp 98.2 F (36.8 C) (Oral)   Resp 15   Ht 6' 0.01" (1.829 m)   Wt 219 lb (99.3 kg)   SpO2 98%   BMI 29.70 kg/m   Wt Readings from Last 3 Encounters:  03/22/24 219 lb (99.3 kg)  02/22/24 219 lb (99.3 kg)  02/18/24 219 lb 3.2 oz (99.4 kg)    Physical Exam Vitals and nursing note reviewed.  Constitutional:      General: He is not in acute distress.    Appearance: Normal appearance. He is not ill-appearing.  HENT:     Head: Normocephalic.     Right Ear: Hearing normal.     Left Ear: Hearing normal.     Nose: Nose normal.  Pulmonary:     Effort: Pulmonary effort is normal. No respiratory distress.  Neurological:     Mental Status: He is alert.  Psychiatric:        Mood and Affect: Mood normal.        Behavior: Behavior normal.        Thought Content: Thought content normal.        Judgment: Judgment normal.     Results for orders placed or performed in visit on 08/11/23  Urinalysis, Routine w reflex microscopic   Collection Time: 08/11/23  8:34 AM  Result Value Ref Range   Specific Gravity, UA 1.025 1.005 - 1.030   pH, UA 6.5 5.0 -  7.5   Color, UA Yellow Yellow   Appearance Ur Clear Clear   Leukocytes,UA Negative Negative   Protein,UA Negative Negative/Trace   Glucose, UA Negative Negative   Ketones, UA Negative Negative   RBC, UA Negative Negative   Bilirubin, UA Negative Negative   Urobilinogen, Ur 1.0 0.2 - 1.0 mg/dL   Nitrite, UA Negative Negative   Microscopic Examination Comment   TSH   Collection Time: 08/11/23  8:35 AM  Result Value Ref Range   TSH 1.760 0.450 - 4.500 uIU/mL  PSA   Collection Time: 08/11/23  8:35 AM  Result Value Ref Range   Prostate Specific Ag, Serum 1.1 0.0 - 4.0 ng/mL  Lipid panel   Collection Time: 08/11/23  8:35 AM  Result Value Ref Range   Cholesterol, Total 168 100 - 199 mg/dL   Triglycerides 94 0 - 149 mg/dL   HDL 61 >87 mg/dL   VLDL Cholesterol Cal 17 5 - 40 mg/dL   LDL Chol Calc (NIH) 90 0 -  99 mg/dL   Chol/HDL Ratio 2.8 0.0 - 5.0 ratio  CBC with Differential/Platelet   Collection Time: 08/11/23  8:35 AM  Result Value Ref Range   WBC 7.8 3.4 - 10.8 x10E3/uL   RBC 4.73 4.14 - 5.80 x10E6/uL   Hemoglobin 14.9 13.0 - 17.7 g/dL   Hematocrit 56.4 33.2 - 51.0 %   MCV 95 79 - 97 fL   MCH 31.5 26.6 - 33.0 pg   MCHC 33.2 31.5 - 35.7 g/dL   RDW 95.1 88.4 - 16.6 %   Platelets 188 150 - 450 x10E3/uL   Neutrophils 49 Not Estab. %   Lymphs 37 Not Estab. %   Monocytes 9 Not Estab. %   Eos 4 Not Estab. %   Basos 1 Not Estab. %   Neutrophils Absolute 3.8 1.4 - 7.0 x10E3/uL   Lymphocytes Absolute 2.9 0.7 - 3.1 x10E3/uL   Monocytes Absolute 0.7 0.1 - 0.9 x10E3/uL   EOS (ABSOLUTE) 0.3 0.0 - 0.4 x10E3/uL   Basophils Absolute 0.1 0.0 - 0.2 x10E3/uL   Immature Granulocytes 0 Not Estab. %   Immature Grans (Abs) 0.0 0.0 - 0.1 x10E3/uL  Comprehensive metabolic panel   Collection Time: 08/11/23  8:35 AM  Result Value Ref Range   Glucose 118 (H) 70 - 99 mg/dL   BUN 18 8 - 27 mg/dL   Creatinine, Ser 0.63 0.76 - 1.27 mg/dL   eGFR 74 >01 SW/FUX/3.23   BUN/Creatinine Ratio 17 10 - 24   Sodium 141 134 - 144 mmol/L   Potassium 3.5 3.5 - 5.2 mmol/L   Chloride 100 96 - 106 mmol/L   CO2 26 20 - 29 mmol/L   Calcium  9.5 8.6 - 10.2 mg/dL   Total Protein 7.1 6.0 - 8.5 g/dL   Albumin 4.2 3.8 - 4.8 g/dL   Globulin, Total 2.9 1.5 - 4.5 g/dL   Bilirubin Total 0.7 0.0 - 1.2 mg/dL   Alkaline Phosphatase 117 44 - 121 IU/L   AST 23 0 - 40 IU/L   ALT 20 0 - 44 IU/L  HgB A1c   Collection Time: 08/11/23  8:35 AM  Result Value Ref Range   Hgb A1c MFr Bld 5.9 (H) 4.8 - 5.6 %   Est. average glucose Bld gHb Est-mCnc 123 mg/dL      Assessment & Plan:   Problem List Items Addressed This Visit       Cardiovascular and Mediastinum   Hypertension (Chronic)  Relevant Medications   atorvastatin  (LIPITOR) 20 MG tablet   metoprolol  succinate (TOPROL -XL) 25 MG 24 hr tablet   valsartan -hydrochlorothiazide   (DIOVAN -HCT) 160-25 MG tablet   Other Relevant Orders   Comprehensive metabolic panel with GFR   Aortic calcification (HCC)   Relevant Medications   atorvastatin  (LIPITOR) 20 MG tablet   metoprolol  succinate (TOPROL -XL) 25 MG 24 hr tablet   valsartan -hydrochlorothiazide  (DIOVAN -HCT) 160-25 MG tablet     Other   Hyperlipidemia - Primary (Chronic)   Relevant Medications   atorvastatin  (LIPITOR) 20 MG tablet   metoprolol  succinate (TOPROL -XL) 25 MG 24 hr tablet   valsartan -hydrochlorothiazide  (DIOVAN -HCT) 160-25 MG tablet   Other Relevant Orders   Lipid panel   Prediabetes   Relevant Orders   HgB A1c   Normocytic anemia   Relevant Orders   CBC w/Diff   Other Visit Diagnoses       Essential hypertension       Relevant Medications   atorvastatin  (LIPITOR) 20 MG tablet   metoprolol  succinate (TOPROL -XL) 25 MG 24 hr tablet   valsartan -hydrochlorothiazide  (DIOVAN -HCT) 160-25 MG tablet        Follow up plan: Return in about 6 months (around 09/22/2024) for Physical and Fasting labs.

## 2024-03-23 ENCOUNTER — Ambulatory Visit: Payer: Self-pay | Admitting: Nurse Practitioner

## 2024-03-23 LAB — COMPREHENSIVE METABOLIC PANEL WITH GFR
ALT: 24 IU/L (ref 0–44)
AST: 24 IU/L (ref 0–40)
Albumin: 3.9 g/dL (ref 3.8–4.8)
Alkaline Phosphatase: 106 IU/L (ref 44–121)
BUN/Creatinine Ratio: 13 (ref 10–24)
BUN: 11 mg/dL (ref 8–27)
Bilirubin Total: 0.5 mg/dL (ref 0.0–1.2)
CO2: 23 mmol/L (ref 20–29)
Calcium: 9.2 mg/dL (ref 8.6–10.2)
Chloride: 102 mmol/L (ref 96–106)
Creatinine, Ser: 0.87 mg/dL (ref 0.76–1.27)
Globulin, Total: 2.7 g/dL (ref 1.5–4.5)
Glucose: 88 mg/dL (ref 70–99)
Potassium: 4 mmol/L (ref 3.5–5.2)
Sodium: 141 mmol/L (ref 134–144)
Total Protein: 6.6 g/dL (ref 6.0–8.5)
eGFR: 89 mL/min/{1.73_m2} (ref >59)

## 2024-03-23 LAB — CBC WITH DIFFERENTIAL/PLATELET
Basophils Absolute: 0.1 10*3/uL (ref 0.0–0.2)
Basos: 1 %
EOS (ABSOLUTE): 0.2 10*3/uL (ref 0.0–0.4)
Eos: 2 %
Hematocrit: 44.1 % (ref 37.5–51.0)
Hemoglobin: 14.8 g/dL (ref 13.0–17.7)
Immature Grans (Abs): 0.1 10*3/uL (ref 0.0–0.1)
Immature Granulocytes: 1 %
Lymphocytes Absolute: 2.5 10*3/uL (ref 0.7–3.1)
Lymphs: 28 %
MCH: 31.5 pg (ref 26.6–33.0)
MCHC: 33.6 g/dL (ref 31.5–35.7)
MCV: 94 fL (ref 79–97)
Monocytes Absolute: 0.8 10*3/uL (ref 0.1–0.9)
Monocytes: 9 %
Neutrophils Absolute: 5.5 10*3/uL (ref 1.4–7.0)
Neutrophils: 59 %
Platelets: 213 10*3/uL (ref 150–450)
RBC: 4.7 x10E6/uL (ref 4.14–5.80)
RDW: 12.6 % (ref 11.6–15.4)
WBC: 9.1 10*3/uL (ref 3.4–10.8)

## 2024-03-23 LAB — LIPID PANEL
Chol/HDL Ratio: 2.9 ratio (ref 0.0–5.0)
Cholesterol, Total: 145 mg/dL (ref 100–199)
HDL: 50 mg/dL (ref >39)
LDL Chol Calc (NIH): 72 mg/dL (ref 0–99)
Triglycerides: 130 mg/dL (ref 0–149)
VLDL Cholesterol Cal: 23 mg/dL (ref 5–40)

## 2024-03-23 LAB — HEMOGLOBIN A1C
Est. average glucose Bld gHb Est-mCnc: 123 mg/dL
Hgb A1c MFr Bld: 5.9 % — ABNORMAL HIGH (ref 4.8–5.6)

## 2024-07-15 DIAGNOSIS — H18613 Keratoconus, stable, bilateral: Secondary | ICD-10-CM | POA: Diagnosis not present

## 2024-07-15 DIAGNOSIS — H35373 Puckering of macula, bilateral: Secondary | ICD-10-CM | POA: Diagnosis not present

## 2024-07-15 DIAGNOSIS — H2513 Age-related nuclear cataract, bilateral: Secondary | ICD-10-CM | POA: Diagnosis not present

## 2024-09-26 ENCOUNTER — Ambulatory Visit: Admitting: Nurse Practitioner

## 2024-09-26 ENCOUNTER — Encounter: Payer: Self-pay | Admitting: Nurse Practitioner

## 2024-09-26 VITALS — BP 127/73 | HR 54 | Temp 97.7°F | Ht 72.84 in | Wt 217.4 lb

## 2024-09-26 DIAGNOSIS — I10 Essential (primary) hypertension: Secondary | ICD-10-CM | POA: Diagnosis not present

## 2024-09-26 DIAGNOSIS — Z23 Encounter for immunization: Secondary | ICD-10-CM | POA: Diagnosis not present

## 2024-09-26 DIAGNOSIS — E785 Hyperlipidemia, unspecified: Secondary | ICD-10-CM

## 2024-09-26 DIAGNOSIS — L409 Psoriasis, unspecified: Secondary | ICD-10-CM | POA: Diagnosis not present

## 2024-09-26 DIAGNOSIS — R7303 Prediabetes: Secondary | ICD-10-CM | POA: Diagnosis not present

## 2024-09-26 DIAGNOSIS — Z0000 Encounter for general adult medical examination without abnormal findings: Secondary | ICD-10-CM

## 2024-09-26 DIAGNOSIS — I700 Atherosclerosis of aorta: Secondary | ICD-10-CM

## 2024-09-26 MED ORDER — PANTOPRAZOLE SODIUM 40 MG PO TBEC: 40.0000 mg | DELAYED_RELEASE_TABLET | Freq: Every day | ORAL | 1 refills | Status: AC

## 2024-09-26 MED ORDER — ATORVASTATIN CALCIUM 20 MG PO TABS: 20.0000 mg | ORAL_TABLET | Freq: Every day | ORAL | 1 refills | Status: AC

## 2024-09-26 MED ORDER — METOPROLOL SUCCINATE ER 25 MG PO TB24: 25.0000 mg | ORAL_TABLET | Freq: Every day | ORAL | 1 refills | Status: AC

## 2024-09-26 MED ORDER — VALSARTAN-HYDROCHLOROTHIAZIDE 160-25 MG PO TABS: 1.0000 | ORAL_TABLET | Freq: Every day | ORAL | 1 refills | Status: AC

## 2024-09-26 NOTE — Assessment & Plan Note (Signed)
Continue statin. Recheck lipid panel today. Will make recommendations based on lab results.  Refills sent today. Follow up in 6 months.  Call sooner if concerns arise.

## 2024-09-26 NOTE — Progress Notes (Signed)
 BP 127/73 (BP Location: Right Arm, Cuff Size: Normal)   Pulse (!) 54   Temp 97.7 F (36.5 C) (Oral)   Ht 6' 0.84 (1.85 m)   Wt 217 lb 6.4 oz (98.6 kg)   SpO2 98%   BMI 28.81 kg/m    Subjective:    Patient ID: Drew Mcmahon, male    DOB: February 21, 1946, 78 y.o.   MRN: [id number]  HPI: Drew Mcmahon is a 78 y.o. male presenting on 09/26/2024 for comprehensive medical examination. Current medical complaints include:psoriasis  He currently lives with: Interim Problems from his last visit: no  HYPERTENSION / HYPERLIPIDEMIA Satisfied with current treatment? yes Duration of hypertension: years BP monitoring frequency: daily; multiple times a day BP range: 130/74 BP medication side effects: no Past BP meds: valsartan -HCTZ and Metoprolol  Duration of hyperlipidemia: years Cholesterol medication side effects: no Cholesterol supplements: none Past cholesterol medications: atorvastain (lipitor)  Medication compliance: excellent compliance Aspirin : no Recent stressors: no Recurrent headaches: no  Visual changes: no Palpitations: no Dyspnea: no Chest pain: no Lower extremity edema: no Dizzy/lightheaded: no HR runs low 50s- mid 50s at home.  Patient states he has been seeing Dermatology.  He states he is having some psoriasis on his lower right leg.  Doesn't feel like it is helping much.    Depression Screen done today and results listed below:     09/26/2024    8:32 AM 03/22/2024    9:35 AM 02/18/2024   11:28 AM 08/11/2023    8:13 AM 01/05/2023    3:38 PM  Depression screen PHQ 2/9  Decreased Interest 0 0 0 0 0  Down, Depressed, Hopeless 0 0 0 0 0  PHQ - 2 Score 0 0 0 0 0  Altered sleeping 1 0 0 0 0  Tired, decreased energy 0 0 0 0 0  Change in appetite 0 0 0 0 0  Feeling bad or failure about yourself  0 0 0 0 0  Trouble concentrating 0 0 0 0 0  Moving slowly or fidgety/restless 0 0 0 0 0  Suicidal thoughts 0 0 0 0 0  PHQ-9 Score 1 0  0  0  0   Difficult doing work/chores  Not difficult at all  Not difficult at all Not difficult at all Not difficult at all     Data saved with a previous flowsheet row definition    The patient does not have a history of falls. I did complete a risk assessment for falls. A plan of care for falls was documented.   Past Medical History:  Past Medical History:  Diagnosis Date   Advanced care planning/counseling discussion 08/11/2023   Aortic calcification    Arthritis    Cataract 07/30/23   Elevated lipids    Hip fracture (HCC) 2007   History of hip fracture 07/04/2018   Hyperlipidemia    Hypertension    Osteopenia 07/19/2018   Sept 2019; next scan Sept 2021   Postoperative nausea and vomiting 12/24/2018   Pre-diabetes     Surgical History:  Past Surgical History:  Procedure Laterality Date   APPENDECTOMY  04/03/2017   Incidental APPENDECTOMY;  Surgeon: Dessa Reyes ORN, MD;  Location: [facility] ORS;  Service: General;;   BOWEL RESECTION  04/03/2017   Procedure: FOCAL SMALL BOWEL RESECTION;  Surgeon: Dessa Reyes ORN, MD;  Location: [facility];  Service: General;;   CHOLECYSTECTOMY  12/15/22   COLON SURGERY     COLONOSCOPY WITH PROPOFOL  N/A 06/12/2016  Procedure: COLONOSCOPY WITH PROPOFOL ;  Surgeon: Lamar ONEIDA Holmes, MD;  Location[facility] ENDOSCOPY;  Service: Endoscopy;  Laterality: N/A;   COLONOSCOPY WITH PROPOFOL  N/A 05/07/2022   Procedure: COLONOSCOPY WITH PROPOFOL ;  Surgeon: Therisa Bi, MD;  Location: [facility] ENDOSCOPY;  Service: Gastroenterology;  Laterality: N/A;   CYST EXCISION  1973   lower spine   ERCP N/A 12/22/2022   Procedure: ENDOSCOPIC RETROGRADE CHOLANGIOPANCREATOGRAPHY (ERCP);  Surgeon: Jinny Carmine, MD;  Location: [facility];  Service: Endoscopy;  Laterality: N/A;   FRACTURE SURGERY Left 2007   hip   HERNIA REPAIR  12/13/2018   HOLEP-LASER ENUCLEATION OF THE PROSTATE WITH MORCELLATION N/A 06/14/2018   Procedure: HOLEP-LASER ENUCLEATION OF THE PROSTATE WITH MORCELLATION;  Surgeon: Penne Knee,  MD;  Location: [facility] ORS;  Service: Urology;  Laterality: N/A;   LAPAROSCOPY N/A 04/01/2017   Procedure: LAPAROSCOPY DIAGNOSTIC;  Surgeon: Dessa Reyes ORN, MD;  Location: [facility] ORS;  Service: General;  Laterality: N/A;   LAPAROTOMY N/A 04/03/2017   Procedure: EXPLORATORY LAPAROTOMY;  Surgeon: Dessa Reyes ORN, MD;  Location: [facility] ORS;  Service: General;  Laterality: N/A;   LYSIS OF ADHESION  04/03/2017   Procedure: LYSIS OF ADHESION;  Surgeon: Dessa Reyes ORN, MD;  Location: [facility];  Service: General;;   LYSIS OF ADHESION  12/15/2022   Procedure: LYSIS OF ADHESION;  Surgeon: Lane Shope, MD;  Location: [facility];  Service: General;;   SMALL INTESTINE SURGERY  04/03/2017   VENTRAL HERNIA REPAIR N/A 12/13/2018   Procedure: HERNIA REPAIR VENTRAL ADULT WITH COMPONENT SEPARATOR MESH;  Surgeon: Dessa Reyes ORN, MD;  Location: [facility] ORS;  Service: General;  Laterality: N/A;    Medications:  Current Outpatient Medications on File Prior to Visit  Medication Sig   acetaminophen  (TYLENOL ) 500 MG tablet Take 1,000 mg by mouth every 6 (six) hours as needed.   atorvastatin  (LIPITOR) 20 MG tablet Take 1 tablet (20 mg total) by mouth at bedtime.   diclofenac  Sodium (VOLTAREN ) 1 % GEL Apply 4 g topically 4 (four) times daily.   Docusate Calcium  (STOOL SOFTENER PO) Take 1 capsule by mouth in the morning and at bedtime.   metoprolol  succinate (TOPROL -XL) 25 MG 24 hr tablet Take 1 tablet (25 mg total) by mouth daily.   Multiple Vitamins-Minerals (ONE-A-DAY MENS VITACRAVES PO) Take 1 tablet by mouth daily.    pantoprazole  (PROTONIX ) 40 MG tablet Take 1 tablet (40 mg total) by mouth daily.   valsartan -hydrochlorothiazide  (DIOVAN -HCT) 160-25 MG tablet Take 1 tablet by mouth daily.   No current facility-administered medications on file prior to visit.    Allergies:  No Known Allergies  Social History:  Social History   Socioeconomic History   Marital status: Married    Spouse name: Sharman    Number of children: 3   Years of education: Not on file   Highest education level: 12th grade  Occupational History   Occupation: Retired  Tobacco Use   Smoking status: Former    Current packs/day: 0.00    Average packs/day: 0.5 packs/day for 10.0 years (5.0 ttl pk-yrs)    Types: Cigarettes    Start date: 08/27/1975    Quit date: 08/26/1985    Years since quitting: 39.1   Smokeless tobacco: Never   Tobacco comments:    smoking cessation materials not required  Vaping Use   Vaping status: Never Used  Substance and Sexual Activity   Alcohol use: Never   Drug use: Never   Sexual activity: Not Currently    Birth  control/protection: None  Other Topics Concern   Not on file  Social History Narrative   Not on file   Social Drivers of Health   Financial Resource Strain: Low Risk  (09/23/2024)   Overall Financial Resource Strain (CARDIA)    Difficulty of Paying Living Expenses: Not hard at all  Food Insecurity: No Food Insecurity (09/26/2024)   Hunger Vital Sign    Worried About Running Out of Food in the Last Year: Never true    Ran Out of Food in the Last Year: Never true  Transportation Needs: No Transportation Needs (09/26/2024)   PRAPARE - Administrator, Civil Service (Medical): No    Lack of Transportation (Non-Medical): No  Physical Activity: Insufficiently Active (09/23/2024)   Exercise Vital Sign    Days of Exercise per Week: 7 days    Minutes of Exercise per Session: 20 min  Stress: No Stress Concern Present (09/23/2024)   [employer] of Occupational Health - Occupational Stress Questionnaire    Feeling of Stress: Not at all  Social Connections: Socially Integrated (09/26/2024)   Social Connection and Isolation Panel    Frequency of Communication with Friends and Family: More than three times a week    Frequency of Social Gatherings with Friends and Family: Twice a week    Attends Religious Services: More than 4 times per year    Active Member of  [employer] or Organizations: Yes    Attends Banker Meetings: 1 to 4 times per year    Marital Status: Married  Catering Manager Violence: Unknown (09/26/2024)   Humiliation, Afraid, Rape, and Kick questionnaire    Fear of Current or Ex-Partner: Not on file    Emotionally Abused: No    Physically Abused: No    Sexually Abused: No   Social History   Tobacco Use  Smoking Status Former   Current packs/day: 0.00   Average packs/day: 0.5 packs/day for 10.0 years (5.0 ttl pk-yrs)   Types: Cigarettes   Start date: 08/27/1975   Quit date: 08/26/1985   Years since quitting: 39.1  Smokeless Tobacco Never  Tobacco Comments   smoking cessation materials not required   Social History   Substance and Sexual Activity  Alcohol Use Never    Family History:  Family History  Problem Relation Age of Onset   Hypertension Mother    Alzheimer's disease Mother    Hypertension Father    Cancer Father        lung   Hypertension Sister    Cancer Sister    Hypertension Brother    Hypertension Sister    Cancer Sister    Hypertension Sister    Cancer Sister    Hypertension Brother    Prostate cancer Neg Hx    Kidney cancer Neg Hx     Past medical history, surgical history, medications, allergies, family history and social history reviewed with patient today and changes made to appropriate areas of the chart.   Review of Systems  Eyes:  Negative for blurred vision and double vision.  Respiratory:  Negative for shortness of breath.   Cardiovascular:  Negative for chest pain, palpitations and leg swelling.  Skin:  Positive for rash.  Neurological:  Negative for dizziness and headaches.   All other ROS negative except what is listed above and in the HPI.      Objective:    BP 127/73 (BP Location: Right Arm, Cuff Size: Normal)   Pulse (!) 54   Temp  97.7 F (36.5 C) (Oral)   Ht 6' 0.84 (1.85 m)   Wt 217 lb 6.4 oz (98.6 kg)   SpO2 98%   BMI 28.81 kg/m   Wt Readings from  Last 3 Encounters:  09/26/24 217 lb 6.4 oz (98.6 kg)  03/22/24 219 lb (99.3 kg)  02/22/24 219 lb (99.3 kg)    Physical Exam Vitals and nursing note reviewed.  Constitutional:      General: He is not in acute distress.    Appearance: Normal appearance. He is not ill-appearing, toxic-appearing or diaphoretic.  HENT:     Head: Normocephalic.     Right Ear: Tympanic membrane, ear canal and external ear normal.     Left Ear: Tympanic membrane, ear canal and external ear normal.     Nose: Nose normal. No congestion or rhinorrhea.     Mouth/Throat:     Mouth: Mucous membranes are moist.  Eyes:     General:        Right eye: No discharge.        Left eye: No discharge.     Extraocular Movements: Extraocular movements intact.     Conjunctiva/sclera: Conjunctivae normal.     Pupils: Pupils are equal, round, and reactive to light.  Cardiovascular:     Rate and Rhythm: Normal rate and regular rhythm.     Heart sounds: No murmur heard. Pulmonary:     Effort: Pulmonary effort is normal. No respiratory distress.     Breath sounds: Normal breath sounds. No wheezing, rhonchi or rales.  Abdominal:     General: Abdomen is flat. Bowel sounds are normal. There is no distension.     Palpations: Abdomen is soft.     Tenderness: There is no abdominal tenderness. There is no guarding.  Musculoskeletal:     Cervical back: Normal range of motion and neck supple.  Skin:    General: Skin is warm and dry.     Capillary Refill: Capillary refill takes less than 2 seconds.  Neurological:     General: No focal deficit present.     Mental Status: He is alert and oriented to person, place, and time.     Cranial Nerves: No cranial nerve deficit.     Motor: No weakness.     Deep Tendon Reflexes: Reflexes normal.  Psychiatric:        Mood and Affect: Mood normal.        Behavior: Behavior normal.        Thought Content: Thought content normal.        Judgment: Judgment normal.     Results for orders  placed or performed in visit on 03/22/24  Comprehensive metabolic panel with GFR   Collection Time: 03/22/24  9:50 AM  Result Value Ref Range   Glucose 88 70 - 99 mg/dL   BUN 11 8 - 27 mg/dL   Creatinine, Ser 9.12 0.76 - 1.27 mg/dL   eGFR 89 >40 fO/fpw/8.26   BUN/Creatinine Ratio 13 10 - 24   Sodium 141 134 - 144 mmol/L   Potassium 4.0 3.5 - 5.2 mmol/L   Chloride 102 96 - 106 mmol/L   CO2 23 20 - 29 mmol/L   Calcium  9.2 8.6 - 10.2 mg/dL   Total Protein 6.6 6.0 - 8.5 g/dL   Albumin 3.9 3.8 - 4.8 g/dL   Globulin, Total 2.7 1.5 - 4.5 g/dL   Bilirubin Total 0.5 0.0 - 1.2 mg/dL   Alkaline Phosphatase 106 44 - 121 IU/L  AST 24 0 - 40 IU/L   ALT 24 0 - 44 IU/L  Lipid panel   Collection Time: 03/22/24  9:50 AM  Result Value Ref Range   Cholesterol, Total 145 100 - 199 mg/dL   Triglycerides 869 0 - 149 mg/dL   HDL 50 >60 mg/dL   VLDL Cholesterol Cal 23 5 - 40 mg/dL   LDL Chol Calc (NIH) 72 0 - 99 mg/dL   Chol/HDL Ratio 2.9 0.0 - 5.0 ratio  CBC w/Diff   Collection Time: 03/22/24  9:50 AM  Result Value Ref Range   WBC 9.1 3.4 - 10.8 x10E3/uL   RBC 4.70 4.14 - 5.80 x10E6/uL   Hemoglobin 14.8 13.0 - 17.7 g/dL   Hematocrit 55.8 62.4 - 51.0 %   MCV 94 79 - 97 fL   MCH 31.5 26.6 - 33.0 pg   MCHC 33.6 31.5 - 35.7 g/dL   RDW 87.3 88.3 - 84.5 %   Platelets 213 150 - 450 x10E3/uL   Neutrophils 59 Not Estab. %   Lymphs 28 Not Estab. %   Monocytes 9 Not Estab. %   Eos 2 Not Estab. %   Basos 1 Not Estab. %   Neutrophils Absolute 5.5 1.4 - 7.0 x10E3/uL   Lymphocytes Absolute 2.5 0.7 - 3.1 x10E3/uL   Monocytes Absolute 0.8 0.1 - 0.9 x10E3/uL   EOS (ABSOLUTE) 0.2 0.0 - 0.4 x10E3/uL   Basophils Absolute 0.1 0.0 - 0.2 x10E3/uL   Immature Granulocytes 1 Not Estab. %   Immature Grans (Abs) 0.1 0.0 - 0.1 x10E3/uL  HgB A1c   Collection Time: 03/22/24  9:50 AM  Result Value Ref Range   Hgb A1c MFr Bld 5.9 (H) 4.8 - 5.6 %   Est. average glucose Bld gHb Est-mCnc 123 mg/dL      Assessment &  Plan:   Problem List Items Addressed This Visit       Cardiovascular and Mediastinum   Hypertension (Chronic)   Aortic calcification     Other   Hyperlipidemia (Chronic)   Prediabetes   Other Visit Diagnoses       Annual physical exam    -  Primary     Flu vaccine need            Discussed aspirin  prophylaxis for myocardial infarction prevention and decision was it was not indicated  LABORATORY TESTING:  Health maintenance labs ordered today as discussed above.   The natural history of prostate cancer and ongoing controversy regarding screening and potential treatment outcomes of prostate cancer has been discussed with the patient. The meaning of a false positive PSA and a false negative PSA has been discussed. He indicates understanding of the limitations of this screening test and wishes to proceed with screening PSA testing.   IMMUNIZATIONS:   - Tdap: Tetanus vaccination status reviewed: Medicare. - Influenza: Administered today - Pneumovax: Up to date - Prevnar: Up to date - COVID: Refused - HPV: Not applicable - Shingrix vaccine: Discussed at visit today  SCREENING: - Colonoscopy: Up to date  Discussed with patient purpose of the colonoscopy is to detect colon cancer at curable precancerous or early stages   - AAA Screening: Not applicable  -Hearing Test: Not applicable  -Spirometry: Not applicable   PATIENT COUNSELING:    Sexuality: Discussed sexually transmitted diseases, partner selection, use of condoms, avoidance of unintended pregnancy  and contraceptive alternatives.   Advised to avoid cigarette smoking.  I discussed with the patient that most people either  abstain from alcohol or drink within safe limits (<=14/week and <=4 drinks/occasion for males, <=7/weeks and <= 3 drinks/occasion for females) and that the risk for alcohol disorders and other health effects rises proportionally with the number of drinks per week and how often a drinker exceeds daily  limits.  Discussed cessation/primary prevention of drug use and availability of treatment for abuse.   Diet: Encouraged to adjust caloric intake to maintain  or achieve ideal body weight, to reduce intake of dietary saturated fat and total fat, to limit sodium intake by avoiding high sodium foods and not adding table salt, and to maintain adequate dietary potassium and calcium  preferably from fresh fruits, vegetables, and low-fat dairy products.    stressed the importance of regular exercise  Injury prevention: Discussed safety belts, safety helmets, smoke detector, smoking near bedding or upholstery.   Dental health: Discussed importance of regular tooth brushing, flossing, and dental visits.   Follow up plan: NEXT PREVENTATIVE PHYSICAL DUE IN 1 YEAR. No follow-ups on file.

## 2024-09-26 NOTE — Assessment & Plan Note (Signed)
 Chronic.  Controlled.  Elevated at visit.  But checks regularly at home and running 130s/70s consistently. Continue with current medication regimen of Valsartan /HCTZ.  Refills sent today. Return to clinic in 6 months for reevaluation.  Call sooner if concerns arise.   HR has been running low 50s-mid 50s.    Discussed signs of hypotension.

## 2024-09-26 NOTE — Assessment & Plan Note (Signed)
Chronic.  Controlled.  Continue with current medication regimen of Atorvastatin 20mg  daily.  Refill sent today.  Labs ordered today.  Return to clinic in 6 months for reevaluation.  Call sooner if concerns arise.  ? ?

## 2024-09-26 NOTE — Progress Notes (Signed)
 Chief Complaint  Patient presents with   Annual Exam     Subjective:   Drew Mcmahon is a 78 y.o. male who presents for a Medicare Annual Wellness Visit.  Allergies (verified) Patient has no known allergies.   History: Past Medical History:  Diagnosis Date   Advanced care planning/counseling discussion 08/11/2023   Aortic calcification    Arthritis    Cataract 07/30/23   Elevated lipids    Hip fracture (HCC) 2007   History of hip fracture 07/04/2018   Hyperlipidemia    Hypertension    Osteopenia 07/19/2018   Sept 2019; next scan Sept 2021   Postoperative nausea and vomiting 12/24/2018   Pre-diabetes    Past Surgical History:  Procedure Laterality Date   APPENDECTOMY  04/03/2017   Incidental APPENDECTOMY;  Surgeon: Dessa Reyes ORN, MD;  Location: [facility] ORS;  Service: General;;   BOWEL RESECTION  04/03/2017   Procedure: FOCAL SMALL BOWEL RESECTION;  Surgeon: Dessa Reyes ORN, MD;  Location: [facility] ORS;  Service: General;;   CHOLECYSTECTOMY  12/15/22   COLON SURGERY     COLONOSCOPY WITH PROPOFOL  N/A 06/12/2016   Procedure: COLONOSCOPY WITH PROPOFOL ;  Surgeon: Lamar ONEIDA Holmes, MD;  Location: [facility] ENDOSCOPY;  Service: Endoscopy;  Laterality: N/A;   COLONOSCOPY WITH PROPOFOL  N/A 05/07/2022   Procedure: COLONOSCOPY WITH PROPOFOL ;  Surgeon: Therisa Bi, MD;  Location: [facility] ENDOSCOPY;  Service: Gastroenterology;  Laterality: N/A;   CYST EXCISION  1973   lower spine   ERCP N/A 12/22/2022   Procedure: ENDOSCOPIC RETROGRADE CHOLANGIOPANCREATOGRAPHY (ERCP);  Surgeon: Jinny Carmine, MD;  Location: [facility] ENDOSCOPY;  Service: Endoscopy;  Laterality: N/A;   FRACTURE SURGERY Left 2007   hip   HERNIA REPAIR  12/13/2018   HOLEP-LASER ENUCLEATION OF THE PROSTATE WITH MORCELLATION N/A 06/14/2018   Procedure: HOLEP-LASER ENUCLEATION OF THE PROSTATE WITH MORCELLATION;  Surgeon: Penne Knee, MD;  Location: [facility] ORS;  Service: Urology;  Laterality: N/A;   LAPAROSCOPY N/A 04/01/2017    Procedure: LAPAROSCOPY DIAGNOSTIC;  Surgeon: Dessa Reyes ORN, MD;  Location: [facility] ORS;  Service: General;  Laterality: N/A;   LAPAROTOMY N/A 04/03/2017   Procedure: EXPLORATORY LAPAROTOMY;  Surgeon: Dessa Reyes ORN, MD;  Location: [facility] ORS;  Service: General;  Laterality: N/A;   LYSIS OF ADHESION  04/03/2017   Procedure: LYSIS OF ADHESION;  Surgeon: Dessa Reyes ORN, MD;  Location: [facility];  Service: General;;   LYSIS OF ADHESION  12/15/2022   Procedure: LYSIS OF ADHESION;  Surgeon: Lane Shope, MD;  Location: [facility] ORS;  Service: General;;   SMALL INTESTINE SURGERY  04/03/2017   VENTRAL HERNIA REPAIR N/A 12/13/2018   Procedure: HERNIA REPAIR VENTRAL ADULT WITH COMPONENT SEPARATOR MESH;  Surgeon: Dessa Reyes ORN, MD;  Location: [facility] ORS;  Service: General;  Laterality: N/A;   Family History  Problem Relation Age of Onset   Hypertension Mother    Alzheimer's disease Mother    Hypertension Father    Cancer Father        lung   Hypertension Sister    Cancer Sister    Hypertension Brother    Hypertension Sister    Cancer Sister    Hypertension Sister    Cancer Sister    Hypertension Brother    Prostate cancer Neg Hx    Kidney cancer Neg Hx    Social History   Occupational History   Occupation: Retired  Tobacco Use   Smoking status: Former    Current packs/day: 0.00    Average  packs/day: 0.5 packs/day for 10.0 years (5.0 ttl pk-yrs)    Types: Cigarettes    Start date: 08/27/1975    Quit date: 08/26/1985    Years since quitting: 39.1   Smokeless tobacco: Never   Tobacco comments:    smoking cessation materials not required  Vaping Use   Vaping status: Never Used  Substance and Sexual Activity   Alcohol use: Never   Drug use: Never   Sexual activity: Not Currently    Birth control/protection: None   Tobacco Counseling Counseling given: Not Answered Tobacco comments: smoking cessation materials not required  SDOH Screenings   Food Insecurity: No Food  Insecurity (09/26/2024)  Housing: Low Risk  (09/26/2024)  Transportation Needs: No Transportation Needs (09/26/2024)  Utilities: Not At Risk (09/26/2024)  Alcohol Screen: Low Risk  (04/17/2022)  Depression (PHQ2-9): Low Risk  (09/26/2024)  Financial Resource Strain: Low Risk  (09/23/2024)  Physical Activity: Insufficiently Active (09/23/2024)  Social Connections: Socially Integrated (09/26/2024)  Stress: No Stress Concern Present (09/23/2024)  Tobacco Use: Medium Risk (09/26/2024)  Health Literacy: Adequate Health Literacy (09/26/2024)   See flowsheets for full screening details  Depression Screen PHQ 2 & 9 Depression Scale- Over the past 2 weeks, how often have you been bothered by any of the following problems? Little interest or pleasure in doing things: 0 Feeling down, depressed, or hopeless (PHQ Adolescent also includes...irritable): 0 PHQ-2 Total Score: 0 Trouble falling or staying asleep, or sleeping too much: 1 Feeling tired or having little energy: 0 Poor appetite or overeating (PHQ Adolescent also includes...weight loss): 0 Feeling bad about yourself - or that you are a failure or have let yourself or your family down: 0 Trouble concentrating on things, such as reading the newspaper or watching television (PHQ Adolescent also includes...like school work): 0 Moving or speaking so slowly that other people could have noticed. Or the opposite - being so fidgety or restless that you have been moving around a lot more than usual: 0 Thoughts that you would be better off dead, or of hurting yourself in some way: 0 PHQ-9 Total Score: 1 If you checked off any problems, how difficult have these problems made it for you to do your work, take care of things at home, or get along with other people?: Not difficult at all     Goals Addressed               This Visit's Progress     Activity and Exercise Increase (pt-stated)        Evidence-based guidance:  Review current exercise levels.   Assess patient perspective on exercise or activity level, barriers to increasing activity, motivation and readiness for change.  Recommend or set healthy exercise goal based on individual tolerance.  Encourage small steps toward making change in amount of exercise or activity.  Urge reduction of sedentary activities or screen time.  Promote group activities within the community or with family or support person.  Consider referral to rehabiliation therapist for assessment and exercise/activity plan.   Notes:        Visit info / Clinical Intake: Medicare Wellness Visit Type:: Subsequent Annual Wellness Visit Persons participating in visit:: patient Medicare Wellness Visit Mode:: In-person (required for WTM) Information given by:: patient Interpreter Needed?: No Pre-visit prep was completed: yes AWV questionnaire completed by patient prior to visit?: no Living arrangements:: lives with spouse/significant other Patient's Overall Health Status Rating: good Typical amount of pain: some (Some stiffness some days) Does pain affect daily life?:  no Are you currently prescribed opioids?: no  Dietary Habits and Nutritional Risks How many meals a day?: 3 Eats fruit and vegetables daily?: yes Most meals are obtained by: preparing own meals In the last 2 weeks, have you had any of the following?: none Diabetic:: (!) yes Any non-healing wounds?: no How often do you check your BS?: as needed Would you like to be referred to a Nutritionist or for Diabetic Management? : no  Functional Status Activities of Daily Living (to include ambulation/medication): Independent Ambulation: Independent Medication Administration: Independent Home Management: Independent Manage your own finances?: yes Primary transportation is: driving Concerns about vision?: no *vision screening is required for WTM* (Wears glasses) Concerns about hearing?: (!) yes Uses hearing aids?: (!) yes Hear whispered voice?:  yes  Fall Screening Falls in the past year?: 0 Number of falls in past year: 0 Was there an injury with Fall?: 0 Fall Risk Category Calculator: 0 Patient Fall Risk Level: Low Fall Risk  Fall Risk Patient at Risk for Falls Due to: No Fall Risks Fall risk Follow up: Falls evaluation completed  Home and Transportation Safety: All rugs have non-skid backing?: yes All stairs or steps have railings?: N/A, no stairs Grab bars in the bathtub or shower?: yes Have non-skid surface in bathtub or shower?: yes Good home lighting?: yes Regular seat belt use?: yes Hospital stays in the last year:: no  Cognitive Assessment Difficulty concentrating, remembering, or making decisions? : no Will 6CIT or Mini Cog be Completed: yes What year is it?: 0 points What month is it?: 0 points Give patient an address phrase to remember (5 components): [address] [place] About what time is it?: 0 points Count backwards from 20 to 1: 0 points Say the months of the year in reverse: 0 points Repeat the address phrase from earlier: 0 points 6 CIT Score: 0 points  Advance Directives (For Healthcare) Does Patient Have a Medical Advance Directive?: Yes Does patient want to make changes to medical advance directive?: No - Patient declined Type of Advance Directive: Living will; Healthcare Power of Attorney Copy of Healthcare Power of Attorney in Chart?: -- (No copy in records) Copy of Living Will in Chart?: -- (No copy in records)  Reviewed/Updated  Reviewed/Updated: Reviewed All (Medical, Surgical, Family, Medications, Allergies, Care Teams, Patient Goals); Medical History; Surgical History; Family History; Medications; Allergies; Care Teams; Patient Goals        Objective:    Today's Vitals   09/26/24 0808 09/26/24 0827  BP: (!) 178/75 127/73  Pulse: (!) 53 (!) 54  Temp: 97.7 F (36.5 C)   TempSrc: Oral   SpO2: 98%   Weight: 217 lb 6.4 oz (98.6 kg)   Height: 6' 0.84 (1.85 m)   PainSc:  0-No pain    Body mass index is 28.81 kg/m.  Current Medications (verified) Outpatient Encounter Medications as of 09/26/2024  Medication Sig   acetaminophen  (TYLENOL ) 500 MG tablet Take 1,000 mg by mouth every 6 (six) hours as needed.   diclofenac  Sodium (VOLTAREN ) 1 % GEL Apply 4 g topically 4 (four) times daily.   Docusate Calcium  (STOOL SOFTENER PO) Take 1 capsule by mouth in the morning and at bedtime.   Multiple Vitamins-Minerals (ONE-A-DAY MENS VITACRAVES PO) Take 1 tablet by mouth daily.    [DISCONTINUED] atorvastatin  (LIPITOR) 20 MG tablet Take 1 tablet (20 mg total) by mouth at bedtime.   [DISCONTINUED] metoprolol  succinate (TOPROL -XL) 25 MG 24 hr tablet Take 1 tablet (25 mg total) by  mouth daily.   [DISCONTINUED] pantoprazole  (PROTONIX ) 40 MG tablet Take 1 tablet (40 mg total) by mouth daily.   [DISCONTINUED] valsartan -hydrochlorothiazide  (DIOVAN -HCT) 160-25 MG tablet Take 1 tablet by mouth daily.   atorvastatin  (LIPITOR) 20 MG tablet Take 1 tablet (20 mg total) by mouth at bedtime.   metoprolol  succinate (TOPROL -XL) 25 MG 24 hr tablet Take 1 tablet (25 mg total) by mouth daily.   pantoprazole  (PROTONIX ) 40 MG tablet Take 1 tablet (40 mg total) by mouth daily.   valsartan -hydrochlorothiazide  (DIOVAN -HCT) 160-25 MG tablet Take 1 tablet by mouth daily.   No facility-administered encounter medications on file as of 09/26/2024.   Hearing/Vision screen No results found. Immunizations and Health Maintenance Health Maintenance  Topic Date Due   DTaP/Tdap/Td (2 - Tdap) 05/18/2018   COVID-19 Vaccine (1 - 2025-26 season) 10/12/2024 (Originally 06/27/2024)   Zoster Vaccines- Shingrix (1 of 2) 12/25/2024 (Originally 11/03/1996)   Medicare Annual Wellness (AWV)  09/26/2025   Pneumococcal Vaccine: 50+ Years  Completed   Influenza Vaccine  Completed   Hepatitis C Screening  Completed   Meningococcal B Vaccine  Aged Out   Colonoscopy  Discontinued        Assessment/Plan:  This is a  routine wellness examination for Drew Mcmahon.  Patient Care Team: Melvin Pao, NP as PCP - General Penne Knee, MD (Inactive) as Consulting Physician (Urology) Dessa Reyes ORN, MD as Consulting Physician (General Surgery)  I have personally reviewed and noted the following in the patient's chart:   Medical and social history Use of alcohol, tobacco or illicit drugs  Current medications and supplements including opioid prescriptions. Functional ability and status Nutritional status Physical activity Advanced directives List of other physicians Hospitalizations, surgeries, and ER visits in previous 12 months Vitals Screenings to include cognitive, depression, and falls Referrals and appointments  Orders Placed This Encounter  Procedures   Flu vaccine HIGH DOSE PF(Fluzone Trivalent)   TSH   PSA   Lipid panel   CBC with Differential/Platelet   Comprehensive metabolic panel with GFR   Hemoglobin A1c   Ambulatory referral to Dermatology    Referral Priority:   Routine    Referral Type:   Consultation    Referral Reason:   Specialty Services Required    Requested Specialty:   Dermatology    Number of Visits Requested:   1   In addition, I have reviewed and discussed with patient certain preventive protocols, quality metrics, and best practice recommendations. A written personalized care plan for preventive services as well as general preventive health recommendations were provided to patient.   Drew Mcmahon, CMA   09/26/2024   Return in about 6 months (around 03/27/2025) for HTN, HLD, DM2 FU.  After Visit Summary: (In Person-Printed) AVS printed and given to the patient

## 2024-09-26 NOTE — Assessment & Plan Note (Signed)
 Chronic.  Controlled with diet.  Labs ordered at visit today.  Will make recommendations based on lab results.  Last A1c was 5.9% in May 2025.

## 2024-09-27 ENCOUNTER — Ambulatory Visit: Payer: Self-pay | Admitting: Nurse Practitioner

## 2024-09-27 LAB — CBC WITH DIFFERENTIAL/PLATELET
Basophils Absolute: 0 x10E3/uL (ref 0.0–0.2)
Basos: 0 %
EOS (ABSOLUTE): 0.3 x10E3/uL (ref 0.0–0.4)
Eos: 3 %
Hematocrit: 44.4 % (ref 37.5–51.0)
Hemoglobin: 14.9 g/dL (ref 13.0–17.7)
Immature Grans (Abs): 0 x10E3/uL (ref 0.0–0.1)
Immature Granulocytes: 0 %
Lymphocytes Absolute: 2.5 x10E3/uL (ref 0.7–3.1)
Lymphs: 33 %
MCH: 31.6 pg (ref 26.6–33.0)
MCHC: 33.6 g/dL (ref 31.5–35.7)
MCV: 94 fL (ref 79–97)
Monocytes Absolute: 0.6 x10E3/uL (ref 0.1–0.9)
Monocytes: 8 %
Neutrophils Absolute: 4.2 x10E3/uL (ref 1.4–7.0)
Neutrophils: 56 %
Platelets: 191 x10E3/uL (ref 150–450)
RBC: 4.71 x10E6/uL (ref 4.14–5.80)
RDW: 12 % (ref 11.6–15.4)
WBC: 7.6 x10E3/uL (ref 3.4–10.8)

## 2024-09-27 LAB — COMPREHENSIVE METABOLIC PANEL WITH GFR
ALT: 16 IU/L (ref 0–44)
AST: 22 IU/L (ref 0–40)
Albumin: 3.9 g/dL (ref 3.8–4.8)
Alkaline Phosphatase: 116 IU/L (ref 47–123)
BUN/Creatinine Ratio: 14 (ref 10–24)
BUN: 14 mg/dL (ref 8–27)
Bilirubin Total: 0.6 mg/dL (ref 0.0–1.2)
CO2: 25 mmol/L (ref 20–29)
Calcium: 9.3 mg/dL (ref 8.6–10.2)
Chloride: 100 mmol/L (ref 96–106)
Creatinine, Ser: 1 mg/dL (ref 0.76–1.27)
Globulin, Total: 2.6 g/dL (ref 1.5–4.5)
Glucose: 116 mg/dL — ABNORMAL HIGH (ref 70–99)
Potassium: 4.4 mmol/L (ref 3.5–5.2)
Sodium: 139 mmol/L (ref 134–144)
Total Protein: 6.5 g/dL (ref 6.0–8.5)
eGFR: 78 mL/min/1.73 (ref >59)

## 2024-09-27 LAB — PSA: Prostate Specific Ag, Serum: 0.9 ng/mL (ref 0.0–4.0)

## 2024-09-27 LAB — LIPID PANEL
Chol/HDL Ratio: 2.7 ratio (ref 0.0–5.0)
Cholesterol, Total: 157 mg/dL (ref 100–199)
HDL: 58 mg/dL (ref >39)
LDL Chol Calc (NIH): 82 mg/dL (ref 0–99)
Triglycerides: 90 mg/dL (ref 0–149)
VLDL Cholesterol Cal: 17 mg/dL (ref 5–40)

## 2024-09-27 LAB — HEMOGLOBIN A1C
Est. average glucose Bld gHb Est-mCnc: 123 mg/dL
Hgb A1c MFr Bld: 5.9 % — ABNORMAL HIGH (ref 4.8–5.6)

## 2024-09-27 LAB — TSH: TSH: 1.77 u[IU]/mL (ref 0.450–4.500)

## 2024-10-04 ENCOUNTER — Ambulatory Visit: Admitting: Dermatology

## 2024-10-04 MED ORDER — CLINDAMYCIN PHOSPHATE 1 % EX LOTN: TOPICAL_LOTION | CUTANEOUS | 2 refills | Status: AC

## 2024-10-04 MED ORDER — CLOBETASOL PROPIONATE 0.05 % EX CREA: TOPICAL_CREAM | CUTANEOUS | 2 refills | Status: AC

## 2024-10-04 MED ORDER — KETOCONAZOLE 2 % EX CREA: TOPICAL_CREAM | CUTANEOUS | 2 refills | Status: AC

## 2024-10-04 NOTE — Patient Instructions (Signed)

## 2024-10-04 NOTE — Progress Notes (Unsigned)
   New Patient Visit   Subjective  Drew Mcmahon is a 78 y.o. male who presents for the following: Patient c/o a rash on his legs and back for several years, his previous dermatologist prescribed Triamcinolone cream with a poor response. Patient c/o dry scaly skin and thicken toenails     The following portions of the chart were reviewed this encounter and updated as appropriate: medications, allergies, medical history  Review of Systems:  No other skin or systemic complaints except as noted in HPI or Assessment and Plan.  Objective  Well appearing patient in no apparent distress; mood and affect are within normal limits.    A focused examination was performed of the following areas: Face, legs and back   Relevant exam findings are noted in the Assessment and Plan.                 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 Can do a biopsy if patient desires. It usually does not change management. Start Clindamycin  lotion twice daily when needed is appropriate. Not safe long-term Discussed use of over the counter antiitch lotions (Sarna, Aveeno antiitch.SABRA). Moisturizers with pramoxine, menthol as needed. These help short term but don't cure.     STASIS DERMATITIS with Schamberg   Exam: Hyperpigmentation on the lower legs and various various veins and pink patchy scaliness of the lower legs  Photo taken   Chronic and persistent condition with duration or expected duration over one year. Condition is symptomatic/ bothersome to patient. Not currently at goal.   Stasis in the legs causes chronic leg swelling, which may result in itchy or painful rashes, skin discoloration, skin texture changes, and sometimes ulceration.  Recommend daily graduated compression hose/stockings- easiest to put on first thing in morning, remove at bedtime.  Elevate legs as much as  possible. Avoid salt/sodium rich foods.  Treatment Plan: Start Clobetasol  cream apply to pink rash on lower legs bid 5 days a week     TINEA PEDIS/TINEA UNGUIUM  Exam: Scaling and maceration web spaces and over distal and lateral- right foot   Treatment Plan: Nail-Fungal-ID/Derm-ID Molecular Diagnostic test performed today.  Discussed with patient their insurance will be billed.  Advised the patient they may get a bill for a portion that's not covered by their insurance.  Should the patient have any issues with their remaining responsibility they will not be sent to collections but KRISTINE will work with them internally on any remaining balance.      Labs reviewed from 09/26/2024 CMP normal   Start Ketoconazole  2% cream apply to entire feet at bedtime    Return in about 6 weeks (around 11/15/2024) for tinea pedis/stasis dermatitis .  IFay Kirks, CMA, am acting as scribe for Alm Rhyme, MD .   Documentation: I have reviewed the above documentation for accuracy and completeness, and I agree with the above.  Alm Rhyme, MD

## 2024-10-05 ENCOUNTER — Encounter: Payer: Self-pay | Admitting: Dermatology

## 2024-10-10 ENCOUNTER — Telehealth: Payer: Self-pay

## 2024-10-10 NOTE — Telephone Encounter (Signed)
 Please see molecular pathogen report scanned into media and advise. Thank you!

## 2024-11-07 ENCOUNTER — Other Ambulatory Visit: Payer: Self-pay | Admitting: Nurse Practitioner

## 2024-11-07 DIAGNOSIS — E785 Hyperlipidemia, unspecified: Secondary | ICD-10-CM

## 2024-11-07 DIAGNOSIS — I10 Essential (primary) hypertension: Secondary | ICD-10-CM

## 2024-11-22 ENCOUNTER — Ambulatory Visit: Admitting: Dermatology

## 2024-11-22 ENCOUNTER — Encounter: Payer: Self-pay | Admitting: Dermatology

## 2024-11-22 DIAGNOSIS — L817 Pigmented purpuric dermatosis: Secondary | ICD-10-CM | POA: Diagnosis not present

## 2024-11-22 DIAGNOSIS — B353 Tinea pedis: Secondary | ICD-10-CM

## 2024-11-22 DIAGNOSIS — B351 Tinea unguium: Secondary | ICD-10-CM

## 2024-11-22 DIAGNOSIS — Z79899 Other long term (current) drug therapy: Secondary | ICD-10-CM

## 2024-11-22 DIAGNOSIS — L821 Other seborrheic keratosis: Secondary | ICD-10-CM | POA: Diagnosis not present

## 2024-11-22 DIAGNOSIS — I872 Venous insufficiency (chronic) (peripheral): Secondary | ICD-10-CM | POA: Diagnosis not present

## 2024-11-22 DIAGNOSIS — L820 Inflamed seborrheic keratosis: Secondary | ICD-10-CM

## 2024-11-22 DIAGNOSIS — Z7189 Other specified counseling: Secondary | ICD-10-CM

## 2024-11-22 NOTE — Patient Instructions (Addendum)
 Graduated compression stockings, knee highs [facility]   Cryotherapy Aftercare  Wash gently with soap and water everyday.   Apply Vaseline and Band-Aid daily until healed.   Due to recent changes in healthcare laws, you may see results of your pathology and/or laboratory studies on MyChart before the doctors have had a chance to review them. We understand that in some cases there may be results that are confusing or concerning to you. Please understand that not all results are received at the same time and often the doctors may need to interpret multiple results in order to provide you with the best plan of care or course of treatment. Therefore, we ask that you please give us  2 business days to thoroughly review all your results before contacting the office for clarification. Should we see a critical lab result, you will be contacted sooner.   If You Need Anything After Your Visit  If you have any questions or concerns for your doctor, please call our main line at [phone] and press option 4 to reach your doctor's medical assistant. If no one answers, please leave a voicemail as directed and we will return your call as soon as possible. Messages left after 4 pm will be answered the following business day.   You may also send us  a message via MyChart. We typically respond to MyChart messages within 1-2 business days.  For prescription refills, please ask your pharmacy to contact our office. Our fax number is [phone].  If you have an urgent issue when the clinic is closed that cannot wait until the next business day, you can page your doctor at the number below.    Please note that while we do our best to be available for urgent issues outside of office hours, we are not available 24/7.   If you have an urgent issue and are unable to reach us , you may choose to seek medical care at your doctor's office, retail clinic, urgent care center, or emergency room.  If  you have a medical emergency, please immediately call 911 or go to the emergency department.  Pager Numbers  - Dr. Hester: [phone]  - Dr. Jackquline: [phone]  - Dr. Claudene: [phone]   - Dr. Raymund: [phone]  In the event of inclement weather, please call our main line at [phone] for an update on the status of any delays or closures.  Dermatology Medication Tips: Please keep the boxes that topical medications come in in order to help keep track of the instructions about where and how to use these. Pharmacies typically print the medication instructions only on the boxes and not directly on the medication tubes.   If your medication is too expensive, please contact our office at [phone] option 4 or send us  a message through MyChart.   We are unable to tell what your co-pay for medications will be in advance as this is different depending on your insurance coverage. However, we may be able to find a substitute medication at lower cost or fill out paperwork to get insurance to cover a needed medication.   If a prior authorization is required to get your medication covered by your insurance company, please allow us  1-2 business days to complete this process.  Drug prices often vary depending on where the prescription is filled and some pharmacies may offer cheaper prices.  The website www.goodrx.com contains coupons for medications through different pharmacies. The prices here do not account for what the cost may  be with help from insurance (it may be cheaper with your insurance), but the website can give you the price if you did not use any insurance.  - You can print the associated coupon and take it with your prescription to the pharmacy.  - You may also stop by our office during regular business hours and pick up a GoodRx coupon card.  - If you need your prescription sent electronically to a different pharmacy, notify our office through [facility] or by  phone at [phone] option 4.     Si Usted Necesita Algo Despus de Su Visita  Tambin puede enviarnos un mensaje a travs de Clinical Cytogeneticist. Por lo general respondemos a los mensajes de MyChart en el transcurso de 1 a 2 das hbiles.  Para renovar recetas, por favor pida a su farmacia que se ponga en contacto con nuestra oficina. Randi lakes de fax es [place] [phone].  Si tiene un asunto urgente cuando la clnica est cerrada y que no puede esperar hasta el siguiente da hbil, puede llamar/localizar a su doctor(a) al nmero que aparece a continuacin.   Por favor, tenga en cuenta que aunque hacemos todo lo posible para estar disponibles para asuntos urgentes fuera del horario de [place], no estamos disponibles las 24 horas del da, los 7 [address]  [address] de la [place].   Si tiene un problema urgente y no puede comunicarse con nosotros, puede optar por buscar atencin mdica  en el consultorio de su doctor(a), en una clnica privada, en un centro de atencin urgente o en una sala de emergencias.  Si tiene engineer, drilling, por favor llame inmediatamente al 911 o vaya a la sala de emergencias.  Nmeros de bper  - Dr. Hester: [phone]  - Dra. Jackquline: [phone]  - Dr. Claudene: [phone]  - Dra. Kitts: [phone]  En caso de inclemencias del [place], por favor llame a nuestra lnea principal al [phone] para una actualizacin sobre el estado de cualquier retraso o cierre.  Consejos para la medicacin en dermatologa: Por favor, guarde las cajas en las que vienen los medicamentos de uso tpico para ayudarle a seguir las instrucciones sobre dnde y cmo usarlos. Las farmacias generalmente imprimen las instrucciones del medicamento slo en las cajas y no directamente en los tubos del [place].   Si su medicamento es muy caro, por favor, pngase en contacto con landry rieger llamando al [phone] y presione la opcin 4 o envenos un mensaje a travs de Clinical Cytogeneticist.   No  podemos decirle cul ser su copago por los medicamentos por adelantado ya que esto es diferente dependiendo de la cobertura de su seguro. Sin embargo, es posible que podamos encontrar un medicamento sustituto a audiological scientist un formulario para que el seguro cubra el medicamento que se considera necesario.   Si se requiere una autorizacin previa para que su compaa de seguros cubra su medicamento, por favor permtanos de 1 a 2 das hbiles para completar este proceso.  Los precios de los medicamentos varan con frecuencia dependiendo del environmental consultant de dnde se surte la receta y alguna farmacias pueden ofrecer precios ms baratos.  El sitio web www.goodrx.com tiene cupones para medicamentos de health and safety inspector. Los precios aqu no tienen en cuenta lo que podra costar con la ayuda del seguro (puede ser ms barato con su seguro), pero el sitio web puede darle el precio si no utiliz tourist information centre manager.  - Puede imprimir el cupn correspondiente y llevarlo con su receta a la farmacia.  - Tambin  puede pasar por nuestra oficina durante el horario de atencin regular y education officer, museum una tarjeta de cupones de GoodRx.  - Si necesita que su receta se enve electrnicamente a una farmacia diferente, informe a nuestra oficina a travs de MyChart de [place] o por telfono llamando al [phone] y presione la opcin 4.

## 2024-11-22 NOTE — Progress Notes (Signed)
 "  Follow-Up Visit   Subjective  Drew Mcmahon is a 79 y.o. male who presents for the following: Stasis dermatitis 6 wk f/u, lower legs, Clobetasol  cr qd, improved, Tinea Pedis/Unguium positive culture Trychophyton, currently only using Ketoconazole  cr qhs, pt is not on oral treatment due to being on Atorvastatin , check spot back  The patient has spots, moles and lesions to be evaluated, some may be new or changing and the patient may have concern these could be cancer.  The following portions of the chart were reviewed this encounter and updated as appropriate: medications, allergies, medical history  Review of Systems:  No other skin or systemic complaints except as noted in HPI or Assessment and Plan.  Objective  Well appearing patient in no apparent distress; mood and affect are within normal limits.   A focused examination was performed of the following areas: Legs, feet, face, ears, back  Relevant exam findings are noted in the Assessment and Plan.  back x 2 (2) Stuck on waxy paps with erythema  Assessment & Plan   STASIS DERMATITIS with Schamberg's Purpura Lower legs Exam: Erythematous, scaly patches involving the ankle and distal lower leg with associated lower leg edema. Chronic and persistent condition with duration or expected duration over one year. Condition is improving with treatment but not currently at goal. Stasis in the legs causes chronic leg swelling, which may result in itchy or painful rashes, skin discoloration, skin texture changes, and sometimes ulceration.  Recommend daily graduated compression hose/stockings- easiest to put on first thing in morning, remove at bedtime.  Elevate legs as much as possible. Avoid salt/sodium rich foods.  Treatment Plan: Recommend graduated compression stockings Cont Clobetasol  cr qd up to 5d/wk prn flares, avoid f/g/a  Topical steroids (such as triamcinolone, fluocinolone, fluocinonide, mometasone, clobetasol , halobetasol,  betamethasone, hydrocortisone) can cause thinning and lightening of the skin if they are used for too long in the same area. Your physician has selected the right strength medicine for your problem and area affected on the body. Please use your medication only as directed by your physician to prevent side effects.     TINEA PEDIS with positive Derm ID Nail culture Trychophyton R foot, toenails Molecular study results: Molecular study from feet and toenails 09/2024 showed fungus, mostly Trychophyton species. Because you are on oral Atorvastin, we should not treat with oral meds unless you can stop Atorvastin for a few months. For now, stay on topical Ketoconazole  cream apply to feet and nails at bedtime indefinitely until seen back in office. When you see your PCP, you can ask if you can be off Atorvastin for a few months while on an antifungal pill.  Exam: Scaling and maceration web spaces and over distal and lateral soles toenail dystrophy.  Treatment Plan: Cont Ketoconazole  2% cr qhs to R foot and toenails Discussed oral Fluconazole, patient will need clearance from PCP to d/c Atorvastatin  while taking Fluconazole.  SEBORRHEIC KERATOSIS - Stuck-on, waxy, tan-brown papules and/or plaques  - Benign-appearing - Discussed benign etiology and prognosis. - Observe - Call for any changes  INFLAMED SEBORRHEIC KERATOSIS (2) back x 2 (2) Symptomatic, irritating, patient would like treated. - Destruction of lesion - back x 2 (2) Complexity: simple   Destruction method: cryotherapy   Informed consent: discussed and consent obtained   Timeout:  patient name, date of birth, surgical site, and procedure verified Lesion destroyed using liquid nitrogen: Yes   Region frozen until ice ball extended beyond lesion: Yes  Outcome: patient tolerated procedure well with no complications   Post-procedure details: wound care instructions given    SEBORRHEIC KERATOSIS   TINEA PEDIS OF BOTH  FEET   TINEA UNGUIUM   COUNSELING AND COORDINATION OF CARE   MEDICATION MANAGEMENT   STASIS DERMATITIS OF BOTH LEGS   SCHAMBERG'S PURPURA    Return in about 1 year (around 11/22/2025) for UBSE.  I, Grayce Saunas, RMA, am acting as scribe for Alm Rhyme, MD .   Documentation: I have reviewed the above documentation for accuracy and completeness, and I agree with the above.  Alm Rhyme, MD    "

## 2025-03-28 ENCOUNTER — Ambulatory Visit: Admitting: Nurse Practitioner

## 2025-11-22 ENCOUNTER — Ambulatory Visit: Admitting: Dermatology
